# Patient Record
Sex: Female | Born: 1960 | Race: White | Hispanic: No | Marital: Married | Smoking: Never smoker
Health system: Southern US, Community
[De-identification: ages and names within clinical notes are randomized; demographics above are authoritative.]

## PROBLEM LIST (undated history)

## (undated) DIAGNOSIS — K7460 Unspecified cirrhosis of liver: Secondary | ICD-10-CM

## (undated) DIAGNOSIS — M1990 Unspecified osteoarthritis, unspecified site: Secondary | ICD-10-CM

## (undated) DIAGNOSIS — F419 Anxiety disorder, unspecified: Secondary | ICD-10-CM

## (undated) DIAGNOSIS — G893 Neoplasm related pain (acute) (chronic): Secondary | ICD-10-CM

## (undated) DIAGNOSIS — R188 Other ascites: Secondary | ICD-10-CM

## (undated) DIAGNOSIS — Z803 Family history of malignant neoplasm of breast: Secondary | ICD-10-CM

## (undated) DIAGNOSIS — D6859 Other primary thrombophilia: Secondary | ICD-10-CM

## (undated) DIAGNOSIS — I2699 Other pulmonary embolism without acute cor pulmonale: Secondary | ICD-10-CM

## (undated) DIAGNOSIS — R519 Headache, unspecified: Secondary | ICD-10-CM

## (undated) DIAGNOSIS — R112 Nausea with vomiting, unspecified: Secondary | ICD-10-CM

## (undated) DIAGNOSIS — M5030 Other cervical disc degeneration, unspecified cervical region: Secondary | ICD-10-CM

## (undated) DIAGNOSIS — I878 Other specified disorders of veins: Secondary | ICD-10-CM

## (undated) DIAGNOSIS — I10 Essential (primary) hypertension: Secondary | ICD-10-CM

## (undated) DIAGNOSIS — G4730 Sleep apnea, unspecified: Secondary | ICD-10-CM

## (undated) DIAGNOSIS — H409 Unspecified glaucoma: Secondary | ICD-10-CM

## (undated) DIAGNOSIS — F32A Depression, unspecified: Secondary | ICD-10-CM

## (undated) DIAGNOSIS — K219 Gastro-esophageal reflux disease without esophagitis: Secondary | ICD-10-CM

## (undated) DIAGNOSIS — Z9889 Other specified postprocedural states: Secondary | ICD-10-CM

## (undated) HISTORY — PX: ABDOMINAL HYSTERECTOMY: SHX81

## (undated) HISTORY — PX: PILONIDAL CYST / SINUS EXCISION: SUR543

## (undated) HISTORY — PX: COLONOSCOPY: SHX174

## (undated) HISTORY — DX: Family history of malignant neoplasm of breast: Z80.3

## (undated) HISTORY — PX: CHOLECYSTECTOMY: SHX55

## (undated) HISTORY — DX: Unspecified glaucoma: H40.9

## (undated) HISTORY — DX: Unspecified osteoarthritis, unspecified site: M19.90

## (undated) HISTORY — DX: Neoplasm related pain (acute) (chronic): G89.3

---

## 2005-02-28 ENCOUNTER — Ambulatory Visit: Payer: Self-pay | Admitting: Internal Medicine

## 2005-03-20 ENCOUNTER — Ambulatory Visit: Payer: Self-pay | Admitting: Internal Medicine

## 2006-01-29 ENCOUNTER — Emergency Department: Payer: Self-pay | Admitting: General Practice

## 2006-02-27 ENCOUNTER — Ambulatory Visit: Payer: Self-pay | Admitting: Internal Medicine

## 2006-04-10 ENCOUNTER — Ambulatory Visit: Payer: Self-pay | Admitting: Internal Medicine

## 2006-09-16 HISTORY — PX: ABDOMINAL HYSTERECTOMY: SHX81

## 2007-03-04 ENCOUNTER — Ambulatory Visit: Payer: Self-pay | Admitting: Internal Medicine

## 2007-05-06 ENCOUNTER — Ambulatory Visit: Payer: Self-pay | Admitting: Obstetrics and Gynecology

## 2007-05-11 ENCOUNTER — Inpatient Hospital Stay: Payer: Self-pay | Admitting: Obstetrics and Gynecology

## 2007-06-04 ENCOUNTER — Emergency Department: Payer: Self-pay | Admitting: Emergency Medicine

## 2008-03-07 ENCOUNTER — Ambulatory Visit: Payer: Self-pay | Admitting: Internal Medicine

## 2009-08-15 ENCOUNTER — Ambulatory Visit: Payer: Self-pay | Admitting: Internal Medicine

## 2010-05-09 ENCOUNTER — Ambulatory Visit: Payer: Self-pay | Admitting: Internal Medicine

## 2010-08-21 ENCOUNTER — Ambulatory Visit: Payer: Self-pay | Admitting: Internal Medicine

## 2011-08-29 ENCOUNTER — Ambulatory Visit: Payer: Self-pay | Admitting: Internal Medicine

## 2012-09-02 ENCOUNTER — Ambulatory Visit: Payer: Self-pay | Admitting: Internal Medicine

## 2013-04-09 ENCOUNTER — Ambulatory Visit: Payer: Self-pay | Admitting: Unknown Physician Specialty

## 2013-09-07 ENCOUNTER — Ambulatory Visit: Payer: Self-pay | Admitting: Internal Medicine

## 2014-09-13 ENCOUNTER — Ambulatory Visit: Payer: Self-pay | Admitting: Internal Medicine

## 2014-10-17 DIAGNOSIS — S8002XA Contusion of left knee, initial encounter: Secondary | ICD-10-CM | POA: Insufficient documentation

## 2014-11-02 ENCOUNTER — Ambulatory Visit: Payer: Self-pay

## 2015-03-06 ENCOUNTER — Emergency Department
Admission: EM | Admit: 2015-03-06 | Discharge: 2015-03-06 | Disposition: A | Discharge status: Home / Self Care | Payer: BC Managed Care – PPO | Attending: Emergency Medicine | Admitting: Emergency Medicine

## 2015-03-06 ENCOUNTER — Other Ambulatory Visit: Payer: Self-pay

## 2015-03-06 ENCOUNTER — Encounter: Payer: Self-pay | Admitting: Emergency Medicine

## 2015-03-06 ENCOUNTER — Emergency Department: Payer: BC Managed Care – PPO

## 2015-03-06 DIAGNOSIS — I10 Essential (primary) hypertension: Secondary | ICD-10-CM | POA: Insufficient documentation

## 2015-03-06 DIAGNOSIS — R079 Chest pain, unspecified: Secondary | ICD-10-CM | POA: Diagnosis present

## 2015-03-06 DIAGNOSIS — Z79899 Other long term (current) drug therapy: Secondary | ICD-10-CM | POA: Diagnosis not present

## 2015-03-06 DIAGNOSIS — R0602 Shortness of breath: Secondary | ICD-10-CM | POA: Diagnosis not present

## 2015-03-06 DIAGNOSIS — Z880 Allergy status to penicillin: Secondary | ICD-10-CM | POA: Diagnosis not present

## 2015-03-06 HISTORY — DX: Essential (primary) hypertension: I10

## 2015-03-06 LAB — COMPREHENSIVE METABOLIC PANEL
ALT: 29 U/L (ref 14–54)
ANION GAP: 4 — AB (ref 5–15)
AST: 27 U/L (ref 15–41)
Albumin: 3.9 g/dL (ref 3.5–5.0)
Alkaline Phosphatase: 82 U/L (ref 38–126)
BUN: 12 mg/dL (ref 6–20)
CO2: 27 mmol/L (ref 22–32)
Calcium: 9.5 mg/dL (ref 8.9–10.3)
Chloride: 106 mmol/L (ref 101–111)
Creatinine, Ser: 0.77 mg/dL (ref 0.44–1.00)
GFR calc Af Amer: >60 mL/min (ref >60)
Glucose, Bld: 107 mg/dL — ABNORMAL HIGH (ref 65–99)
Potassium: 3.3 mmol/L — ABNORMAL LOW (ref 3.5–5.1)
SODIUM: 137 mmol/L (ref 135–145)
Total Bilirubin: 0.8 mg/dL (ref 0.3–1.2)
Total Protein: 7.3 g/dL (ref 6.5–8.1)

## 2015-03-06 LAB — CBC
HCT: 38.1 % (ref 35.0–47.0)
Hemoglobin: 12.8 g/dL (ref 12.0–16.0)
MCH: 31.1 pg (ref 26.0–34.0)
MCHC: 33.4 g/dL (ref 32.0–36.0)
MCV: 93.1 fL (ref 80.0–100.0)
PLATELETS: 246 10*3/uL (ref 150–440)
RBC: 4.1 MIL/uL (ref 3.80–5.20)
RDW: 13.9 % (ref 11.5–14.5)
WBC: 7.1 10*3/uL (ref 3.6–11.0)

## 2015-03-06 LAB — TROPONIN I: Troponin I: <0.03 ng/mL (ref <0.031)

## 2015-03-06 LAB — FIBRIN DERIVATIVES D-DIMER (ARMC ONLY): Fibrin derivatives D-dimer (ARMC): 348 (ref 0–499)

## 2015-03-06 MED ORDER — NAPROXEN 500 MG PO TABS: 500.0000 mg | ORAL_TABLET | Freq: Two times a day (BID) | ORAL | Status: DC

## 2015-03-06 NOTE — Discharge Instructions (Signed)

## 2015-03-06 NOTE — ED Provider Notes (Signed)
[facility] Emergency Department Provider Note  ____________________________________________  Time seen: 9:30 AM  I have reviewed the triage vital signs and the nursing notes.   HISTORY  Chief Complaint Chest Pain and Shortness of Breath      HPI Brittany Montoya is a 54 y.o. female who was sent from urgent care for evaluation of left-sided chest discomfort that is moderate and is described as sharp in nature. Patient reports it started approximately 14 hours ago and has been relatively constant. She states it is worse with deep inspiration. Her mother recently had a heart attack. She is never had any history of coronary artery disease. No fevers chills. She complains of mild shortness of breath which he thinks this may be because it's uncomfortable to take a deep breath. She denies recent injury     Past Medical History  Diagnosis Date  . Hypertension     There are no active problems to display for this patient.   History reviewed. No pertinent past surgical history.  Current Outpatient Rx  Name  Route  Sig  Dispense  Refill  . furosemide (LASIX) 20 MG tablet   Oral   Take 1 tablet by mouth daily.         . hydrochlorothiazide (HYDRODIURIL) 12.5 MG tablet   Oral   Take 1 tablet by mouth daily.         Marland Kitchen losartan (COZAAR) 100 MG tablet   Oral   Take 1 tablet by mouth daily.         . Multiple Vitamin (MULTIVITAMIN WITH MINERALS) TABS tablet   Oral   Take 1 tablet by mouth daily.         . timolol (TIMOPTIC) 0.5 % ophthalmic solution   Both Eyes   Place 1 drop into both eyes at bedtime.            Allergies Penicillins  History reviewed. No pertinent family history.  Social History History  Substance Use Topics  . Smoking status: Never Smoker   . Smokeless tobacco: Never Used  . Alcohol Use: No    Review of Systems  Constitutional: Negative for fever. Eyes: Negative for visual changes. ENT: Negative for  sore throat Cardiovascular: Positive for chest pain Respiratory: Positive for shortness of breath Gastrointestinal: Negative for abdominal pain, vomiting and diarrhea. Genitourinary: Negative for dysuria. Musculoskeletal: Negative for back pain. Skin: Negative for rash. Neurological: Negative for headaches or focal weakness Psychiatric: No anxiety  10-point ROS otherwise negative.  ____________________________________________   PHYSICAL EXAM:  VITAL SIGNS: ED Triage Vitals  Enc Vitals Group     BP 03/06/15 0851 158/95 mmHg     Pulse Rate 03/06/15 0851 73     Resp 03/06/15 0851 20     Temp 03/06/15 0851 98.1 F (36.7 C)     Temp Source 03/06/15 0851 Oral     SpO2 03/06/15 0851 99 %     Weight 03/06/15 0851 197 lb (89.359 kg)     Height 03/06/15 0851 5\' 1"  (1.549 m)     Head Cir --      Peak Flow --      Pain Score 03/06/15 0852 8     Pain Loc --      Pain Edu? --      Excl. in [place]? --      Constitutional: Alert and oriented. Well appearing and in no distress. Eyes: Conjunctivae are normal.  ENT   Head: Normocephalic and atraumatic.  Mouth/Throat: Mucous membranes are moist. Cardiovascular: Normal rate, regular rhythm. Normal and symmetric distal pulses are present in all extremities. No murmurs, rubs, or gallops. Respiratory: Normal respiratory effort without tachypnea nor retractions. Breath sounds are clear and equal bilaterally.  Gastrointestinal: Soft and non-tender in all quadrants. No distention. There is no CVA tenderness. Genitourinary: deferred Musculoskeletal: Nontender with normal range of motion in all extremities. No lower extremity tenderness nor edema. Neurologic:  Normal speech and language. No gross focal neurologic deficits are appreciated. Skin:  Skin is warm, dry and intact. No rash noted. Psychiatric: Mood and affect are normal. Patient exhibits appropriate insight and judgment.  ____________________________________________    LABS  (pertinent positives/negatives)  Labs Reviewed  CBC  COMPREHENSIVE METABOLIC PANEL  TROPONIN I  FIBRIN DERIVATIVES D-DIMER ([facility] ONLY)    ____________________________________________   EKG  ED ECG REPORT I, Lavonia Drafts, the attending physician, personally viewed and interpreted this ECG.   Date: 03/06/2015  EKG Time: 8:54 AM  Rate: 73  Rhythm: normal sinus rhythm  Axis: Normal  Intervals:none  ST&T Change: Nonspecific   ____________________________________________    RADIOLOGY  Chest x-ray reviewed by me, no acute distress  ____________________________________________   PROCEDURES  Procedure(s) performed: none  Critical Care performed: none  ____________________________________________   INITIAL IMPRESSION / ASSESSMENT AND PLAN / ED COURSE  Pertinent labs & imaging results that were available during my care of the patient were reviewed by me and considered in my medical decision making (see chart for details).  Patient overall well-appearing. EKG reassuring. We will check cardiac enzymes and d-dimer, chest x-ray and reevaluate ----------------------------------------- 10:54 AM on 03/06/2015 -----------------------------------------  D-dimer and troponin normal. Chest x-ray unremarkable. Patient well-appearing and benign exam. Feel this is likely chest wall pain the patient will require follow-up with cardiology within 2 days. ____________________________________________   FINAL CLINICAL IMPRESSION(S) / ED DIAGNOSES  Final diagnoses:  Chest pain, unspecified chest pain type     Lavonia Drafts, MD 03/06/15 1153

## 2015-03-06 NOTE — ED Notes (Signed)
Patient to ED from [facility] clinic with reports of pain under left breast that started last night around 7pm. Patient reports pain was fairly constant through the night and is sharp in nature, accompanied by shortness of breath.

## 2015-04-06 ENCOUNTER — Ambulatory Visit: Payer: BC Managed Care – PPO | Admitting: Cardiovascular Disease

## 2015-10-02 ENCOUNTER — Other Ambulatory Visit: Payer: Self-pay | Admitting: Internal Medicine

## 2015-10-02 DIAGNOSIS — F3342 Major depressive disorder, recurrent, in full remission: Secondary | ICD-10-CM | POA: Insufficient documentation

## 2015-10-02 DIAGNOSIS — I10 Essential (primary) hypertension: Secondary | ICD-10-CM | POA: Insufficient documentation

## 2015-10-02 DIAGNOSIS — G4733 Obstructive sleep apnea (adult) (pediatric): Secondary | ICD-10-CM | POA: Insufficient documentation

## 2015-10-02 DIAGNOSIS — G8929 Other chronic pain: Secondary | ICD-10-CM

## 2015-10-02 DIAGNOSIS — M25512 Pain in left shoulder: Principal | ICD-10-CM

## 2015-10-04 ENCOUNTER — Other Ambulatory Visit: Payer: Self-pay | Admitting: Internal Medicine

## 2015-10-04 DIAGNOSIS — Z1231 Encounter for screening mammogram for malignant neoplasm of breast: Secondary | ICD-10-CM

## 2015-10-11 ENCOUNTER — Ambulatory Visit
Admission: RE | Admit: 2015-10-11 | Discharge: 2015-10-11 | Disposition: A | Discharge status: Home / Self Care | Payer: BC Managed Care – PPO | Source: Ambulatory Visit | Attending: Internal Medicine | Admitting: Internal Medicine

## 2015-10-11 DIAGNOSIS — Z1231 Encounter for screening mammogram for malignant neoplasm of breast: Secondary | ICD-10-CM | POA: Diagnosis present

## 2015-10-20 ENCOUNTER — Ambulatory Visit
Admission: RE | Admit: 2015-10-20 | Discharge: 2015-10-20 | Disposition: A | Discharge status: Home / Self Care | Payer: BC Managed Care – PPO | Source: Ambulatory Visit | Attending: Internal Medicine | Admitting: Internal Medicine

## 2015-10-20 DIAGNOSIS — G8929 Other chronic pain: Secondary | ICD-10-CM | POA: Diagnosis not present

## 2015-10-20 DIAGNOSIS — M7580 Other shoulder lesions, unspecified shoulder: Secondary | ICD-10-CM | POA: Insufficient documentation

## 2015-10-20 DIAGNOSIS — R531 Weakness: Secondary | ICD-10-CM | POA: Diagnosis not present

## 2015-10-20 DIAGNOSIS — M25512 Pain in left shoulder: Secondary | ICD-10-CM | POA: Insufficient documentation

## 2016-01-15 DIAGNOSIS — M7582 Other shoulder lesions, left shoulder: Secondary | ICD-10-CM | POA: Insufficient documentation

## 2016-01-15 DIAGNOSIS — M75112 Incomplete rotator cuff tear or rupture of left shoulder, not specified as traumatic: Secondary | ICD-10-CM | POA: Insufficient documentation

## 2016-04-01 DIAGNOSIS — M47812 Spondylosis without myelopathy or radiculopathy, cervical region: Secondary | ICD-10-CM | POA: Insufficient documentation

## 2016-11-05 ENCOUNTER — Other Ambulatory Visit: Payer: Self-pay | Admitting: Internal Medicine

## 2016-11-05 DIAGNOSIS — Z1231 Encounter for screening mammogram for malignant neoplasm of breast: Secondary | ICD-10-CM

## 2016-11-29 ENCOUNTER — Ambulatory Visit
Admission: RE | Admit: 2016-11-29 | Discharge: 2016-11-29 | Disposition: A | Discharge status: Home / Self Care | Payer: BC Managed Care – PPO | Source: Ambulatory Visit | Attending: Internal Medicine | Admitting: Internal Medicine

## 2016-11-29 DIAGNOSIS — Z1231 Encounter for screening mammogram for malignant neoplasm of breast: Secondary | ICD-10-CM | POA: Insufficient documentation

## 2018-03-10 ENCOUNTER — Other Ambulatory Visit: Payer: Self-pay | Admitting: Internal Medicine

## 2018-03-10 DIAGNOSIS — Z1231 Encounter for screening mammogram for malignant neoplasm of breast: Secondary | ICD-10-CM

## 2018-03-11 ENCOUNTER — Encounter (INDEPENDENT_AMBULATORY_CARE_PROVIDER_SITE_OTHER): Payer: Self-pay

## 2018-03-11 ENCOUNTER — Ambulatory Visit
Admission: RE | Admit: 2018-03-11 | Discharge: 2018-03-11 | Disposition: A | Discharge status: Home / Self Care | Payer: BC Managed Care – PPO | Source: Ambulatory Visit | Attending: Internal Medicine | Admitting: Internal Medicine

## 2018-03-11 DIAGNOSIS — Z1231 Encounter for screening mammogram for malignant neoplasm of breast: Secondary | ICD-10-CM | POA: Insufficient documentation

## 2019-01-21 DIAGNOSIS — E7849 Other hyperlipidemia: Secondary | ICD-10-CM | POA: Insufficient documentation

## 2019-02-04 ENCOUNTER — Other Ambulatory Visit: Payer: Self-pay | Admitting: Internal Medicine

## 2019-02-04 DIAGNOSIS — Z1231 Encounter for screening mammogram for malignant neoplasm of breast: Secondary | ICD-10-CM

## 2019-03-02 DIAGNOSIS — H409 Unspecified glaucoma: Secondary | ICD-10-CM | POA: Insufficient documentation

## 2019-03-02 DIAGNOSIS — I878 Other specified disorders of veins: Secondary | ICD-10-CM | POA: Insufficient documentation

## 2019-03-02 DIAGNOSIS — M5030 Other cervical disc degeneration, unspecified cervical region: Secondary | ICD-10-CM | POA: Insufficient documentation

## 2019-03-03 ENCOUNTER — Encounter: Payer: Self-pay | Admitting: Podiatry

## 2019-03-03 ENCOUNTER — Ambulatory Visit: Payer: BC Managed Care – PPO

## 2019-03-03 ENCOUNTER — Ambulatory Visit: Payer: BC Managed Care – PPO | Admitting: Podiatry

## 2019-03-03 ENCOUNTER — Other Ambulatory Visit: Payer: Self-pay

## 2019-03-03 VITALS — Temp 97.7°F

## 2019-03-03 DIAGNOSIS — M7751 Other enthesopathy of right foot: Secondary | ICD-10-CM

## 2019-03-03 DIAGNOSIS — M779 Enthesopathy, unspecified: Secondary | ICD-10-CM | POA: Diagnosis not present

## 2019-03-03 DIAGNOSIS — Q828 Other specified congenital malformations of skin: Secondary | ICD-10-CM | POA: Diagnosis not present

## 2019-03-03 DIAGNOSIS — M7752 Other enthesopathy of left foot: Secondary | ICD-10-CM | POA: Diagnosis not present

## 2019-03-03 DIAGNOSIS — M778 Other enthesopathies, not elsewhere classified: Secondary | ICD-10-CM

## 2019-03-03 NOTE — Progress Notes (Signed)
  Subjective:  Patient ID: Brittany Montoya, female    DOB: Dec 14, 1960,  MRN: [id number] HPI Chief Complaint  Patient presents with  . Callouses    Patient presents today for painful callouses bilat 5th mets x months.  She states "sometimes it feels like something poking in my foot and my left is much worse than my right foot."  She says she has used the corn pads with no relief     59 y.o. female presents with the above complaint.   ROS: Denies fever chills nausea vomiting muscle aches pains calf pain back pain chest pain shortness of breath.  Past Medical History:  Diagnosis Date  . Hypertension    Past Surgical History:  Procedure Laterality Date  . ABDOMINAL HYSTERECTOMY      Current Outpatient Medications:  .  DULoxetine (CYMBALTA) 30 MG capsule, Take by mouth., Disp: , Rfl:  .  acetaminophen (TYLENOL) 500 MG tablet, Take by mouth., Disp: , Rfl:  .  hydrochlorothiazide (HYDRODIURIL) 12.5 MG tablet, Take 1 tablet by mouth daily., Disp: , Rfl:  .  losartan (COZAAR) 100 MG tablet, Take 1 tablet by mouth daily., Disp: , Rfl:  .  Multiple Vitamin (MULTIVITAMIN WITH MINERALS) TABS tablet, Take 1 tablet by mouth daily., Disp: , Rfl:  .  naproxen (NAPROSYN) 500 MG tablet, Take 1 tablet (500 mg total) by mouth 2 (two) times daily with a meal., Disp: 20 tablet, Rfl: 2 .  timolol (TIMOPTIC) 0.5 % ophthalmic solution, Place 1 drop into both eyes at bedtime. , Disp: , Rfl:   Allergies  Allergen Reactions  . Penicillins   . Amlodipine Other (See Comments) and Rash    Not effective Not effective    Review of Systems Objective:   Vitals:   03/03/19 1430  Temp: 97.7 F (36.5 C)    General: Well developed, nourished, in no acute distress, alert and oriented x3   Dermatological: Skin is warm, dry and supple bilateral. Nails x 10 are well maintained; remaining integument appears unremarkable at this time. There are no open sores, no preulcerative lesions, no rash or signs of  infection present.  Porokeratosis lateral aspect of the fifth metatarsal heads bilateral.  Vascular: Dorsalis Pedis artery and Posterior Tibial artery pedal pulses are 2/4 bilateral with immedate capillary fill time. Pedal hair growth present. No varicosities and no lower extremity edema present bilateral.   Neruologic: Grossly intact via light touch bilateral. Vibratory intact via tuning fork bilateral. Protective threshold with Semmes Wienstein monofilament intact to all pedal sites bilateral. Patellar and Achilles deep tendon reflexes 2+ bilateral. No Babinski or clonus noted bilateral.   Musculoskeletal: No gross boney pedal deformities bilateral. No pain, crepitus, or limitation noted with foot and ankle range of motion bilateral. Muscular strength 5/5 in all groups tested bilateral.  Palpable bursitis beneath the fifth metatarsal heads and lateral to the fifth metatarsal heads bilaterally.  Gait: Unassisted, Nonantalgic.    Radiographs:  None taken  Assessment & Plan:   Assessment: Bursitis fifth metatarsal phalangeal joints plantar lateral aspect bilaterally porokeratotic lesions lateral aspect of the fifth metatarsal heads bilaterally  Plan: Discussed etiology pathology conservative surgical therapy at this point in time injected 2 mg of dexamethasone and local anesthetic beneath the lesions at each fifth metatarsal head.  This was performed after Betadine was applied.  I also debrided all reactive hyperkeratotic tissue for her.     Max T. [place], [place]

## 2019-03-15 ENCOUNTER — Other Ambulatory Visit: Payer: Self-pay

## 2019-03-15 ENCOUNTER — Ambulatory Visit
Admission: RE | Admit: 2019-03-15 | Discharge: 2019-03-15 | Disposition: A | Discharge status: Home / Self Care | Payer: BC Managed Care – PPO | Source: Ambulatory Visit | Attending: Internal Medicine | Admitting: Internal Medicine

## 2019-03-15 DIAGNOSIS — Z1231 Encounter for screening mammogram for malignant neoplasm of breast: Secondary | ICD-10-CM | POA: Insufficient documentation

## 2019-11-23 ENCOUNTER — Ambulatory Visit: Payer: BC Managed Care – PPO | Admitting: Podiatry

## 2019-11-23 ENCOUNTER — Other Ambulatory Visit: Payer: Self-pay | Admitting: Podiatry

## 2019-11-23 ENCOUNTER — Ambulatory Visit (INDEPENDENT_AMBULATORY_CARE_PROVIDER_SITE_OTHER): Payer: BC Managed Care – PPO

## 2019-11-23 ENCOUNTER — Other Ambulatory Visit: Payer: Self-pay

## 2019-11-23 DIAGNOSIS — M79671 Pain in right foot: Secondary | ICD-10-CM | POA: Diagnosis not present

## 2019-11-23 DIAGNOSIS — M722 Plantar fascial fibromatosis: Secondary | ICD-10-CM | POA: Diagnosis not present

## 2019-11-23 MED ORDER — MELOXICAM 15 MG PO TABS: 15.0000 mg | ORAL_TABLET | Freq: Every day | ORAL | 1 refills | Status: DC

## 2019-11-26 NOTE — Progress Notes (Signed)
   Subjective: 59 y.o. female presenting today with a chief complaint of constant stabbing pain to the right heel that began 3-4 months ago. She states the pain radiates to the midfoot. Walking increases the pain. She has been using a heel brace for treatment. Patient is here for further evaluation and treatment.   Past Medical History:  Diagnosis Date  . Hypertension      Objective: Physical Exam General: The patient is alert and oriented x3 in no acute distress.  Dermatology: Skin is warm, dry and supple bilateral lower extremities. Negative for open lesions or macerations bilateral.   Vascular: Dorsalis Pedis and Posterior Tibial pulses palpable bilateral.  Capillary fill time is immediate to all digits.  Neurological: Epicritic and protective threshold intact bilateral.   Musculoskeletal: Tenderness to palpation to the plantar aspect of the right heel along the plantar fascia. All other joints range of motion within normal limits bilateral. Strength 5/5 in all groups bilateral.   Radiographic exam: Normal osseous mineralization. Joint spaces preserved. No fracture/dislocation/boney destruction. No other soft tissue abnormalities or radiopaque foreign bodies.   Assessment: 1. Plantar fasciitis right  Plan of Care:  1. Patient evaluated. Xrays reviewed.   2. Injection of 0.5cc Celestone soluspan injected into the right plantar fascia  3. Prescription for Meloxicam provided to patient. 4. Plantar fascial band(s) dispensed 5. Instructed patient regarding therapies and modalities at home to alleviate symptoms.  6. Return to clinic in 4 weeks.     Edrick Kins, DPM [facility]  Dr. Edrick Kins, DPM    [address], [place] [address]                Office [phone]  Fax [phone]

## 2019-12-24 ENCOUNTER — Ambulatory Visit (INDEPENDENT_AMBULATORY_CARE_PROVIDER_SITE_OTHER): Payer: BC Managed Care – PPO | Admitting: Podiatry

## 2019-12-24 ENCOUNTER — Other Ambulatory Visit: Payer: Self-pay

## 2019-12-24 DIAGNOSIS — M722 Plantar fascial fibromatosis: Secondary | ICD-10-CM | POA: Diagnosis not present

## 2019-12-24 DIAGNOSIS — M79671 Pain in right foot: Secondary | ICD-10-CM

## 2019-12-27 NOTE — Progress Notes (Signed)
   Subjective: 59 y.o. female presenting today for follow up evaluation of plantar fasciitis of the right foot. She reports improvement but states the pain flared back up a few days ago. She has been taking Meloxicam and using the plantar fascial brace as directed. She has been wearing new Brooks shoes. Being on the foot excessively increases the pain. Patient is here for further evaluation and treatment.   Past Medical History:  Diagnosis Date  . Hypertension      Objective: Physical Exam General: The patient is alert and oriented x3 in no acute distress.  Dermatology: Skin is warm, dry and supple bilateral lower extremities. Negative for open lesions or macerations bilateral.   Vascular: Dorsalis Pedis and Posterior Tibial pulses palpable bilateral.  Capillary fill time is immediate to all digits.  Neurological: Epicritic and protective threshold intact bilateral.   Musculoskeletal: Tenderness to palpation to the plantar aspect of the right heel along the plantar fascia. All other joints range of motion within normal limits bilateral. Strength 5/5 in all groups bilateral.   Assessment: 1. Plantar fasciitis right  Plan of Care:  1. Patient evaluated. 2. Injection of 0.5cc Celestone soluspan injected into the right plantar fascia  3. Continue taking Meloxicam.  4. Continue using plantar fascial brace.  5. Continue wearing Brooks running shoes.  6. Return to clinic as needed.    Edrick Kins, DPM [facility]  Dr. Edrick Kins, DPM    [address], [place] [address]                Office [phone]  Fax [phone]

## 2020-03-06 ENCOUNTER — Other Ambulatory Visit: Payer: Self-pay | Admitting: Internal Medicine

## 2020-03-06 DIAGNOSIS — Z1231 Encounter for screening mammogram for malignant neoplasm of breast: Secondary | ICD-10-CM

## 2020-03-23 ENCOUNTER — Ambulatory Visit: Payer: BC Managed Care – PPO

## 2020-03-29 ENCOUNTER — Ambulatory Visit: Payer: BC Managed Care – PPO

## 2020-05-08 ENCOUNTER — Ambulatory Visit: Payer: BC Managed Care – PPO

## 2020-05-08 ENCOUNTER — Encounter (INDEPENDENT_AMBULATORY_CARE_PROVIDER_SITE_OTHER): Payer: Self-pay

## 2020-05-08 ENCOUNTER — Other Ambulatory Visit: Payer: Self-pay

## 2020-05-08 ENCOUNTER — Ambulatory Visit
Admission: RE | Admit: 2020-05-08 | Discharge: 2020-05-08 | Disposition: A | Discharge status: Home / Self Care | Payer: BC Managed Care – PPO | Source: Ambulatory Visit | Attending: Internal Medicine | Admitting: Internal Medicine

## 2020-05-08 DIAGNOSIS — Z1231 Encounter for screening mammogram for malignant neoplasm of breast: Secondary | ICD-10-CM

## 2020-06-10 ENCOUNTER — Ambulatory Visit (INDEPENDENT_AMBULATORY_CARE_PROVIDER_SITE_OTHER): Payer: BC Managed Care – PPO

## 2020-06-10 ENCOUNTER — Other Ambulatory Visit: Payer: Self-pay

## 2020-06-10 ENCOUNTER — Ambulatory Visit
Admission: EM | Admit: 2020-06-10 | Discharge: 2020-06-10 | Disposition: A | Discharge status: Home / Self Care | Payer: BC Managed Care – PPO | Attending: Emergency Medicine | Admitting: Emergency Medicine

## 2020-06-10 DIAGNOSIS — M79671 Pain in right foot: Secondary | ICD-10-CM

## 2020-06-10 DIAGNOSIS — S9031XA Contusion of right foot, initial encounter: Secondary | ICD-10-CM

## 2020-06-10 NOTE — ED Triage Notes (Signed)
Patient states that she has been having right foot pain that occurred around 1.5 hours ago. States that she stepped on the vacuum hose and her foot turned over. States that pain has been constant since.

## 2020-06-10 NOTE — Discharge Instructions (Signed)
Ice foot for 15 minues 3-5 times a day for 48h.

## 2020-06-13 ENCOUNTER — Ambulatory Visit: Payer: BC Managed Care – PPO | Admitting: Podiatry

## 2020-06-13 ENCOUNTER — Encounter: Payer: Self-pay | Admitting: *Deleted

## 2020-06-13 ENCOUNTER — Other Ambulatory Visit: Payer: Self-pay

## 2020-06-13 DIAGNOSIS — S9031XA Contusion of right foot, initial encounter: Secondary | ICD-10-CM | POA: Diagnosis not present

## 2020-06-13 MED ORDER — MELOXICAM 15 MG PO TABS: 15.0000 mg | ORAL_TABLET | Freq: Every day | ORAL | 1 refills | Status: DC

## 2020-06-13 MED ORDER — TRAMADOL HCL 50 MG PO TABS
50.0000 mg | ORAL_TABLET | ORAL | 0 refills | Status: AC | PRN
Start: 2020-06-13 — End: 2020-06-18

## 2020-06-13 NOTE — Progress Notes (Signed)
   HPI: 59 y.o. female presenting today with a new complaint regarding an injury that occurred approximately 4 days ago.  DOI: 06/10/2020.  Patient states that she was vacuuming at home when she stepped down hard on the vacuum hose.  She noticed severe pain and tenderness to the right foot immediately.  She went to the doctor/urgent care where x-rays were taken and were negative for fracture.  She has been icing her foot and wearing a postsurgical shoe.  She presents today for follow-up treatment and evaluation  Past Medical History:  Diagnosis Date  . Hypertension      Physical Exam: General: The patient is alert and oriented x3 in no acute distress.  Dermatology: Skin is warm, dry and supple bilateral lower extremities. Negative for open lesions or macerations.  Vascular: Palpable pedal pulses bilaterally. Capillary refill within normal limits.  Ecchymosis with mild edema noted along the plantar arch of the right foot.  According to the patient this is where she stepped down hard on the vacuum hose  Neurological: Epicritic and protective threshold grossly intact bilaterally.   Musculoskeletal Exam: Range of motion within normal limits to all pedal and ankle joints bilateral. Muscle strength 5/5 in all groups bilateral.  Pain and tenderness throughout the arch of the right foot at the site of injury   Assessment: 1.  Right foot contusion 2.  Diffuse pain right foot   Plan of Care:  1. Patient evaluated.  2.  Discontinue postsurgical shoe.  Cam boot dispensed.  Weightbearing as tolerated x2 weeks 3.  Prescription for meloxicam 15 mg daily 4.  Prescription for tramadol 50 mg every 4 hours as needed pain 5.  Ace wrap applied.  Wear daily 6.  Note for work was provided today.  No work x3 weeks. 7.  Return to clinic in 3 weeks  *Works at [employer]?      Edrick Kins, DPM [facility]  Dr. Edrick Kins, DPM    [address], [place] [address]                Office [phone]  Fax [phone]

## 2020-07-04 ENCOUNTER — Encounter: Payer: Self-pay | Admitting: *Deleted

## 2020-07-04 ENCOUNTER — Ambulatory Visit: Payer: BC Managed Care – PPO | Admitting: Podiatry

## 2020-07-04 ENCOUNTER — Other Ambulatory Visit: Payer: Self-pay

## 2020-07-04 DIAGNOSIS — M778 Other enthesopathies, not elsewhere classified: Secondary | ICD-10-CM | POA: Diagnosis not present

## 2020-07-04 DIAGNOSIS — S93601D Unspecified sprain of right foot, subsequent encounter: Secondary | ICD-10-CM

## 2020-07-04 NOTE — Progress Notes (Signed)
   HPI: 59 y.o. female presenting today for follow-up evaluation regarding a foot sprain that occurred to the patient's right foot.  DOI: 06/10/2020.  Patient states that there has been improvement.  She wore the cam boot as instructed for approximately 2 weeks.  She is also been taking the meloxicam and tramadol as needed for the pain.  For the past week she has transitioned out of the cam boot into good supportive sneakers.  She presents for follow-up treatment evaluation  Past Medical History:  Diagnosis Date  . Hypertension      Physical Exam: General: The patient is alert and oriented x3 in no acute distress.  Dermatology: Skin is warm, dry and supple bilateral lower extremities. Negative for open lesions or macerations.  Vascular: Palpable pedal pulses bilaterally. Capillary refill within normal limits.  Ecchymosis with mild edema noted along the plantar arch of the right foot.  According to the patient this is where she stepped down hard on the vacuum hose  Neurological: Epicritic and protective threshold grossly intact bilaterally.   Musculoskeletal Exam: Range of motion within normal limits to all pedal and ankle joints bilateral. Muscle strength 5/5 in all groups bilateral.  There continues to be some pain and tenderness throughout the arch of the right foot at the site of injury as well as the dorsum of the right foot midtarsal joint   Assessment: 1.  Right foot contusion 2.  Diffuse pain right foot   Plan of Care:  1. Patient evaluated.  2.  Continue wearing good supportive sneakers 3.  Compression ankle sleeve dispensed 4.  Injection of 0.5 cc Celestone Soluspan injected into the midtarsal joint right foot at the area of most pain 5.  Continue meloxicam 15 mg daily and tramadol 50 mg as needed pain 6.  Patient may return to work 07/10/2020 full activity no restrictions 7.  Return to clinic as needed  *Works at [employer]      Edrick Kins, DPM [facility]  Dr. Edrick Kins, DPM    [address], [place] [address]                Office [phone]  Fax [phone]

## 2020-07-11 NOTE — ED Provider Notes (Signed)
[facility]    CSN: [id number] Arrival date & time: 06/10/20  1516      History   Chief Complaint Chief Complaint  Patient presents with  . Foot Pain    right    HPI Brittany Montoya is a 59 y.o. female who presents with R foot pain x  1 1/2 hours ago. She stepped over a vacuum hose and her foot twisted inwards. The pain has been constant. Denies hitting her head and denies pain anywhere else.     Past Medical History:  Diagnosis Date  . Hypertension     Patient Active Problem List   Diagnosis Date Noted  . DDD (degenerative disc disease), cervical 03/02/2019  . Glaucoma (increased eye pressure) 03/02/2019  . Venous stasis 03/02/2019  . Facet arthritis of cervical region 04/01/2016  . Incomplete tear of left rotator cuff 01/15/2016  . Rotator cuff tendinitis, left 01/15/2016  . Essential hypertension 10/02/2015  . Obstructive sleep apnea syndrome 10/02/2015  . Recurrent major depressive disorder, in full remission ([place]) 10/02/2015  . Contusion of left knee 10/17/2014    Past Surgical History:  Procedure Laterality Date  . ABDOMINAL HYSTERECTOMY      OB History   No obstetric history on file.      Home Medications    Prior to Admission medications   Medication Sig Start Date End Date Taking? Authorizing Provider  acetaminophen (TYLENOL) 500 MG tablet Take by mouth.   Yes [provider]  DULoxetine (CYMBALTA) 30 MG capsule Take by mouth. 04/20/14  Yes [provider]  hydrochlorothiazide (HYDRODIURIL) 12.5 MG tablet Take 1 tablet by mouth daily. 12/03/14  Yes [provider]  losartan (COZAAR) 100 MG tablet Take 1 tablet by mouth daily. 02/04/15  Yes [provider]  naproxen (NAPROSYN) 500 MG tablet Take 1 tablet (500 mg total) by mouth 2 (two) times daily with a meal. 03/06/15  Yes Lavonia Drafts, MD  timolol (TIMOPTIC) 0.5 % ophthalmic solution Place 1 drop into both eyes at bedtime.  01/20/15  Yes [provider]  meloxicam (MOBIC) 15 MG tablet Take 1 tablet (15 mg total) by mouth daily. 06/13/20   Edrick Kins, DPM  Multiple Vitamin (MULTIVITAMIN WITH MINERALS) TABS tablet Take 1 tablet by mouth daily.    [provider]    Family History Family History  Problem Relation Age of Onset  . Breast cancer Mother 80    Social History Social History   Tobacco Use  . Smoking status: Never Smoker  . Smokeless tobacco: Never Used  Vaping Use  . Vaping Use: Never used  Substance Use Topics  . Alcohol use: No  . Drug use: No     Allergies   Penicillins and Amlodipine   Review of Systems Review of Systems  Constitutional: Positive for activity change.  Musculoskeletal: Positive for arthralgias and joint swelling.  Skin: Negative for color change, pallor, rash and wound.  Neurological: Negative for numbness.     Physical Exam Triage Vital Signs ED Triage Vitals  Enc Vitals Group     BP 06/10/20 1534 (!) 146/100     Pulse Rate 06/10/20 1534 (!) 120     Resp 06/10/20 1534 18     Temp 06/10/20 1534 98.6 F (37 C)     Temp Source 06/10/20 1534 Oral     SpO2 06/10/20 1534 97 %     Weight 06/10/20 1532 200 lb (90.7 kg)     Height 06/10/20  1532 5\' 1"  (1.549 m)     Head Circumference --      Peak Flow --      Pain Score 06/10/20 1532 6     Pain Loc --      Pain Edu? --      Excl. in [place]? --    No data found.  Updated Vital Signs BP (!) 146/100 (BP Location: Right Arm)   Pulse (!) 120   Temp 98.6 F (37 C) (Oral)   Resp 18   Ht 5\' 1"  (1.549 m)   Wt 200 lb (90.7 kg)   SpO2 97%   BMI 37.79 kg/m   Visual Acuity Right Eye Distance:   Left Eye Distance:   Bilateral Distance:    Right Eye Near:   Left Eye Near:    Bilateral Near:     Physical Exam Vitals and nursing note reviewed.  Constitutional:      Appearance: She is obese.  HENT:     Right Ear: External ear normal.     Left Ear: External ear normal.  Eyes:     General: No scleral  icterus.    Conjunctiva/sclera: Conjunctivae normal.  Pulmonary:     Effort: Pulmonary effort is normal.  Musculoskeletal:        General: Swelling and tenderness present. No deformity.     Cervical back: Neck supple.     Comments: R FOOT- with moderate swelling and has point tenderness over mid metatarsals more than lateral. ROM of toes are normal, but provoked pain.  R ANKLE- normal  Skin:    General: Skin is warm and dry.     Findings: No rash.  Neurological:     Mental Status: She is alert and oriented to person, place, and time.     Gait: Gait abnormal.  Psychiatric:        Mood and Affect: Mood normal.        Behavior: Behavior normal.        Thought Content: Thought content normal.        Judgment: Judgment normal.    UC Treatments / Results  Labs (all labs ordered are listed, but only abnormal results are displayed) Labs Reviewed - No data to display  EKG   Radiology No results found.  Procedures Procedures (including critical care time)  Medications Ordered in UC Medications - No data to display  Initial Impression / Assessment and Plan / UC Course  I have reviewed the triage vital signs and the nursing notes. Pertinent  imaging results that were available during my care of the patient were reviewed by me and considered in my medical decision making (see chart for details). Was placed on post op shoe and advised to FU with PCP. See instructions.  Clinical Course as of Jul 12 1211  Tue Jul 11, 2020  1210 DG Foot Complete Right [SR]    Clinical Course User Index [SR] [place], Sunday Spillers, [place]     Final Clinical Impressions(s) / UC Diagnoses   Final diagnoses:  Contusion of right foot, initial encounter     Discharge Instructions     Ice foot for 15 minues 3-5 times a day for 48h.     ED Prescriptions    None     PDMP not reviewed this encounter.   Shelby Mattocks, PA-C 07/11/20 1227

## 2021-03-22 ENCOUNTER — Other Ambulatory Visit: Payer: Self-pay | Admitting: *Deleted

## 2021-03-22 ENCOUNTER — Other Ambulatory Visit: Payer: Self-pay | Admitting: Internal Medicine

## 2021-03-22 DIAGNOSIS — R1084 Generalized abdominal pain: Secondary | ICD-10-CM

## 2021-03-23 ENCOUNTER — Ambulatory Visit (HOSPITAL_COMMUNITY)
Admission: RE | Admit: 2021-03-23 | Discharge: 2021-03-23 | Disposition: A | Discharge status: Home / Self Care | Payer: BC Managed Care – PPO | Source: Ambulatory Visit | Attending: Internal Medicine | Admitting: Internal Medicine

## 2021-03-23 ENCOUNTER — Other Ambulatory Visit: Payer: Self-pay

## 2021-03-23 DIAGNOSIS — R1084 Generalized abdominal pain: Secondary | ICD-10-CM | POA: Diagnosis present

## 2021-03-23 MED ORDER — SODIUM CHLORIDE (PF) 0.9 % IJ SOLN
INTRAMUSCULAR | Status: AC
  Filled 2021-03-23: qty 50

## 2021-03-23 MED ORDER — IOHEXOL 300 MG/ML  SOLN
100.0000 mL | Freq: Once | INTRAMUSCULAR | Status: AC | PRN
  Administered 2021-03-23: 100 mL via INTRAVENOUS

## 2021-03-26 DIAGNOSIS — N838 Other noninflammatory disorders of ovary, fallopian tube and broad ligament: Secondary | ICD-10-CM | POA: Insufficient documentation

## 2021-03-27 ENCOUNTER — Ambulatory Visit: Payer: BC Managed Care – PPO

## 2021-04-05 ENCOUNTER — Other Ambulatory Visit: Payer: Self-pay

## 2021-04-05 ENCOUNTER — Telehealth: Payer: Self-pay

## 2021-04-05 DIAGNOSIS — R180 Malignant ascites: Secondary | ICD-10-CM

## 2021-04-05 DIAGNOSIS — N83291 Other ovarian cyst, right side: Secondary | ICD-10-CM

## 2021-04-05 NOTE — Telephone Encounter (Signed)
Received referral from Dr. Ouida Sills for gyn oncology . CT noted below. CA125 70.9, HE4 409.0, drawn 04-03-2021. Will need to establish with medical oncology as well. Appointments arranged and contacted Ms. Sharlett Iles with details. We will also attempt to get paracentesis performed before these appointments for her bloating and cytology. Will call her once paracentesis is arranged.    IMPRESSION: 1. 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 Moderate associated ascites.

## 2021-04-06 ENCOUNTER — Telehealth: Payer: Self-pay

## 2021-04-06 NOTE — Telephone Encounter (Signed)
Called and went over date/time/instructions for paracentesis, scheduled 04/09/21 at 1430, arrival 1400 [facility] the [facility] entrance. Went over what to expect during procedure. All questions answered.

## 2021-04-09 ENCOUNTER — Other Ambulatory Visit: Payer: Self-pay

## 2021-04-09 ENCOUNTER — Ambulatory Visit
Admission: RE | Admit: 2021-04-09 | Discharge: 2021-04-09 | Disposition: A | Discharge status: Home / Self Care | Payer: BC Managed Care – PPO | Source: Ambulatory Visit | Attending: Obstetrics and Gynecology | Admitting: Obstetrics and Gynecology

## 2021-04-09 DIAGNOSIS — R180 Malignant ascites: Secondary | ICD-10-CM | POA: Diagnosis present

## 2021-04-09 DIAGNOSIS — N83291 Other ovarian cyst, right side: Secondary | ICD-10-CM | POA: Insufficient documentation

## 2021-04-09 NOTE — Procedures (Signed)
PROCEDURE SUMMARY:  Successful image-guided paracentesis from the right lower abdomen.  Yielded 6.3 liters of hazy yellow fluid.  No immediate complications.  EBL < 1 mL Patient tolerated well.   Specimen was sent for labs.  Please see imaging section of Epic for full dictation.  Joaquim Nam PA-C 04/09/2021 3:52 PM

## 2021-04-11 ENCOUNTER — Inpatient Hospital Stay (HOSPITAL_BASED_OUTPATIENT_CLINIC_OR_DEPARTMENT_OTHER): Payer: BC Managed Care – PPO | Admitting: Obstetrics and Gynecology

## 2021-04-11 ENCOUNTER — Telehealth: Payer: Self-pay

## 2021-04-11 ENCOUNTER — Encounter: Payer: Self-pay | Admitting: Oncology

## 2021-04-11 ENCOUNTER — Encounter: Payer: Self-pay | Admitting: Obstetrics and Gynecology

## 2021-04-11 ENCOUNTER — Other Ambulatory Visit: Payer: Self-pay

## 2021-04-11 ENCOUNTER — Inpatient Hospital Stay: Payer: BC Managed Care – PPO

## 2021-04-11 ENCOUNTER — Inpatient Hospital Stay: Payer: BC Managed Care – PPO | Attending: Oncology | Admitting: Oncology

## 2021-04-11 ENCOUNTER — Ambulatory Visit
Admission: RE | Admit: 2021-04-11 | Discharge: 2021-04-11 | Disposition: A | Discharge status: Home / Self Care | Payer: BC Managed Care – PPO | Source: Ambulatory Visit | Attending: Obstetrics and Gynecology | Admitting: Obstetrics and Gynecology

## 2021-04-11 VITALS — BP 124/82 | HR 98 | Temp 96.2°F | Resp 20 | Wt 196.5 lb

## 2021-04-11 VITALS — BP 124/82 | HR 98 | Temp 96.2°F | Resp 18 | Wt 198.0 lb

## 2021-04-11 DIAGNOSIS — I10 Essential (primary) hypertension: Secondary | ICD-10-CM | POA: Diagnosis not present

## 2021-04-11 DIAGNOSIS — R0602 Shortness of breath: Secondary | ICD-10-CM

## 2021-04-11 DIAGNOSIS — K7469 Other cirrhosis of liver: Secondary | ICD-10-CM

## 2021-04-11 DIAGNOSIS — R180 Malignant ascites: Secondary | ICD-10-CM | POA: Insufficient documentation

## 2021-04-11 DIAGNOSIS — R971 Elevated cancer antigen 125 [CA 125]: Secondary | ICD-10-CM | POA: Insufficient documentation

## 2021-04-11 DIAGNOSIS — R5383 Other fatigue: Secondary | ICD-10-CM | POA: Diagnosis not present

## 2021-04-11 DIAGNOSIS — N83291 Other ovarian cyst, right side: Secondary | ICD-10-CM

## 2021-04-11 DIAGNOSIS — R600 Localized edema: Secondary | ICD-10-CM | POA: Diagnosis not present

## 2021-04-11 DIAGNOSIS — C569 Malignant neoplasm of unspecified ovary: Secondary | ICD-10-CM

## 2021-04-11 DIAGNOSIS — Z7189 Other specified counseling: Secondary | ICD-10-CM

## 2021-04-11 DIAGNOSIS — Z791 Long term (current) use of non-steroidal anti-inflammatories (NSAID): Secondary | ICD-10-CM | POA: Diagnosis not present

## 2021-04-11 DIAGNOSIS — E876 Hypokalemia: Secondary | ICD-10-CM | POA: Diagnosis not present

## 2021-04-11 DIAGNOSIS — N838 Other noninflammatory disorders of ovary, fallopian tube and broad ligament: Secondary | ICD-10-CM | POA: Diagnosis not present

## 2021-04-11 DIAGNOSIS — N83201 Unspecified ovarian cyst, right side: Secondary | ICD-10-CM | POA: Diagnosis not present

## 2021-04-11 DIAGNOSIS — R140 Abdominal distension (gaseous): Secondary | ICD-10-CM | POA: Insufficient documentation

## 2021-04-11 DIAGNOSIS — M7989 Other specified soft tissue disorders: Secondary | ICD-10-CM

## 2021-04-11 DIAGNOSIS — Z79899 Other long term (current) drug therapy: Secondary | ICD-10-CM | POA: Insufficient documentation

## 2021-04-11 DIAGNOSIS — R110 Nausea: Secondary | ICD-10-CM | POA: Diagnosis not present

## 2021-04-11 DIAGNOSIS — K5900 Constipation, unspecified: Secondary | ICD-10-CM | POA: Diagnosis not present

## 2021-04-11 DIAGNOSIS — R0609 Other forms of dyspnea: Secondary | ICD-10-CM

## 2021-04-11 DIAGNOSIS — R0600 Dyspnea, unspecified: Secondary | ICD-10-CM

## 2021-04-11 LAB — CBC WITH DIFFERENTIAL/PLATELET
Abs Immature Granulocytes: 0.04 10*3/uL (ref 0.00–0.07)
Basophils Absolute: 0 10*3/uL (ref 0.0–0.1)
Basophils Relative: 0 %
Eosinophils Absolute: 0.1 10*3/uL (ref 0.0–0.5)
Eosinophils Relative: 1 %
HCT: 37.9 % (ref 36.0–46.0)
Hemoglobin: 12.6 g/dL (ref 12.0–15.0)
Immature Granulocytes: 0 %
Lymphocytes Relative: 7 %
Lymphs Abs: 0.7 10*3/uL (ref 0.7–4.0)
MCH: 29.8 pg (ref 26.0–34.0)
MCHC: 33.2 g/dL (ref 30.0–36.0)
MCV: 89.6 fL (ref 80.0–100.0)
Monocytes Absolute: 0.6 10*3/uL (ref 0.1–1.0)
Monocytes Relative: 6 %
Neutro Abs: 8.2 10*3/uL — ABNORMAL HIGH (ref 1.7–7.7)
Neutrophils Relative %: 86 %
Platelets: 337 10*3/uL (ref 150–400)
RBC: 4.23 MIL/uL (ref 3.87–5.11)
RDW: 13.7 % (ref 11.5–15.5)
WBC: 9.6 10*3/uL (ref 4.0–10.5)
nRBC: 0 % (ref 0.0–0.2)

## 2021-04-11 LAB — COMPREHENSIVE METABOLIC PANEL
ALT: 12 U/L (ref 0–44)
AST: 26 U/L (ref 15–41)
Albumin: 2.7 g/dL — ABNORMAL LOW (ref 3.5–5.0)
Alkaline Phosphatase: 73 U/L (ref 38–126)
Anion gap: 7 (ref 5–15)
BUN: 7 mg/dL (ref 6–20)
CO2: 30 mmol/L (ref 22–32)
Calcium: 9.1 mg/dL (ref 8.9–10.3)
Chloride: 98 mmol/L (ref 98–111)
Creatinine, Ser: 0.92 mg/dL (ref 0.44–1.00)
GFR, Estimated: >60 mL/min (ref >60)
Glucose, Bld: 113 mg/dL — ABNORMAL HIGH (ref 70–99)
Potassium: 2.8 mmol/L — ABNORMAL LOW (ref 3.5–5.1)
Sodium: 135 mmol/L (ref 135–145)
Total Bilirubin: 0.4 mg/dL (ref 0.3–1.2)
Total Protein: 6.2 g/dL — ABNORMAL LOW (ref 6.5–8.1)

## 2021-04-11 LAB — HEPATITIS PANEL, ACUTE
HCV Ab: NONREACTIVE
Hep A IgM: NONREACTIVE
Hep B C IgM: NONREACTIVE
Hepatitis B Surface Ag: NONREACTIVE

## 2021-04-11 LAB — LACTATE DEHYDROGENASE: LDH: 154 U/L (ref 98–192)

## 2021-04-11 MED ORDER — POTASSIUM CHLORIDE CRYS ER 20 MEQ PO TBCR: 20.0000 meq | EXTENDED_RELEASE_TABLET | Freq: Two times a day (BID) | ORAL | 0 refills | Status: DC

## 2021-04-11 NOTE — Telephone Encounter (Signed)
Called and notified Ms. Moynahan with negative CXR and [place] lower extremities. Pathology pending.

## 2021-04-11 NOTE — Progress Notes (Signed)
New pt visit, just saw Dr Theora Gianotti in GYN clinic as new pt as well.

## 2021-04-11 NOTE — Progress Notes (Addendum)
Gynecologic Oncology Consult Visit   Referring Provider: Burman Blacksmith Schermerhorn MD    Chief Concern: ascites and pelvic mass concerning for malignancy  Subjective:  Esti Demello is a 60 y.o. female who is seen in consultation from Dr. Ouida Sills for generalized abdominal pain with bloating, nausea, and constipation for 1 month.  03/23/2021 CT scan Large solid and cystic right ovarian mass measuring 14.1 x 18.1 x 16 cm highly suspicious for primary ovarian malignancy. 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 Moderate associated ascites. 2. Slightly small liver with mild nodular contour suggesting a degree of cirrhosis.  CA125  70.9  HE4 409  04/04/2021 clinic visit with Dr. Ouida Sills and he recommended consultation with Gyn Onc  04/09/2021 Successful image-guided paracentesis from the right lower abdomen. Yielded 6.3 liters of hazy yellow fluid. No immediate complications. Cytology results pending.   She presents today with her husband. She spends >50% of her waking time in a bed or chair. 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     Problem List: Patient Active Problem List   Diagnosis Date Noted   DDD (degenerative disc disease), cervical 03/02/2019   Glaucoma (increased eye pressure) 03/02/2019   Venous stasis 03/02/2019   Facet arthritis of cervical region 04/01/2016   Incomplete tear of left rotator cuff 01/15/2016   Rotator cuff tendinitis, left 01/15/2016   Essential hypertension 10/02/2015   Obstructive sleep apnea syndrome 10/02/2015   Recurrent major depressive disorder, in full remission ([place]) 10/02/2015   Contusion of left knee 10/17/2014    Past Medical History: See problem list Past Medical History:  Diagnosis Date   Arthritis    Glaucoma    Hypertension     Past Surgical History: Past Surgical History:  Procedure Laterality Date   ABDOMINAL HYSTERECTOMY N/A    may  have had left ovary removed   CESAREAN SECTION N/A    C-section x 2    Past Gynecologic History:  Menarche: 11  OB History: G2P2 OB History  Gravida Para Term Preterm AB Living  '2 2       2  ' SAB IAB Ectopic Multiple Live Births               # Outcome Date GA Lbr Len/2nd Weight Sex Delivery Anes PTL Lv  2 Para           1 Para             Obstetric Comments  C/Section x 2    Family History: Family History  Problem Relation Age of Onset   Breast cancer Mother 49   Breast cancer Other     Social History: Social History   Socioeconomic History   Marital status: Married    Spouse name: Not on file   Number of children: Not on file   Years of education: Not on file   Highest education level: Not on file  Occupational History   Not on file  Tobacco Use   Smoking status: Never   Smokeless tobacco: Never  Vaping Use   Vaping Use: Never used  Substance and Sexual Activity   Alcohol use: No   Drug use: No   Sexual activity: Not on file  Other Topics Concern   Not on file  Social History Narrative   Not on file   Social Determinants of Health   Financial Resource Strain: Not on file  Food Insecurity: Not  on file  Transportation Needs: Not on file  Physical Activity: Not on file  Stress: Not on file  Social Connections: Not on file  Intimate Partner Violence: Not on file    Allergies: Allergies  Allergen Reactions   Penicillins    Amlodipine Other (See Comments) and Rash    Not effective Not effective     Current Medications: Current Outpatient Medications  Medication Sig Dispense Refill   acetaminophen (TYLENOL) 500 MG tablet Take by mouth.     clonazePAM (KLONOPIN) 0.5 MG tablet Take by mouth.     DULoxetine (CYMBALTA) 30 MG capsule Take by mouth.     hydrochlorothiazide (HYDRODIURIL) 12.5 MG tablet Take 1 tablet by mouth daily.     losartan (COZAAR) 100 MG tablet Take 1 tablet by mouth daily.     meloxicam (MOBIC) 15 MG tablet Take 1 tablet  (15 mg total) by mouth daily. 30 tablet 1   Multiple Vitamin (MULTIVITAMIN WITH MINERALS) TABS tablet Take 1 tablet by mouth daily.     naproxen (NAPROSYN) 500 MG tablet Take 1 tablet (500 mg total) by mouth 2 (two) times daily with a meal. 20 tablet 2   timolol (TIMOPTIC) 0.5 % ophthalmic solution Place 1 drop into both eyes at bedtime.      traMADol (ULTRAM) 50 MG tablet Take 50 mg by mouth every 6 (six) hours as needed.     No current facility-administered medications for this visit.    Review of Systems General: weight gain o/w negative for fevers, or night sweats Skin: negative for changes in moles or sores or rash Eyes: negative for changes in vision HEENT: negative for change in hearing, tinnitus, voice changes Pulmonary: dyspnea,  o/w negative for orthopnea, productive cough, wheezing Cardiac: negative for palpitations, pain Gastrointestinal: nausea and constipation o/w negative for vomiting, diarrhea, hematemesis, hematochezia Genitourinary/Sexual: negative for dysuria, retention, hematuria, incontinence Ob/Gyn:  negative for abnormal bleeding, or pain Musculoskeletal: negative for pain, joint pain, back pain Hematology: negative for easy bruising, abnormal bleeding Neurologic/Psych: negative for headaches, seizures, paralysis, weakness, numbness   Objective:  Physical Examination:  BP 124/82   Pulse 98   Temp (!) 96.2 F (35.7 C)   Resp 20   Wt 196 lb 8 oz (89.1 kg)   SpO2 99%   BMI 37.13 kg/m    ECOG Performance Status: 2 - Symptomatic, <50% confined to bed  GENERAL: Patient pale appearing female in no acute distress HEENT:  PERRL, neck supple with midline trachea.  NODES:  No cervical, supraclavicular, axillary, or inguinal lymphadenopathy palpated.  LUNGS:  Clear to auscultation with decreased BS on the left.  No wheezes or rhonchi. HEART:  Regular rate and rhythm.  ABDOMEN:  Distended and prominent abdominal wall vasculature c/w caput medusa noted; distended in  the upper abdomen with ascites; mass on the right extending to the upper abdomen. Nontender.  EXTREMITIES:  2+ peripheral edema L > R NEURO:  Nonfocal. Well oriented.  Appropriate affect.  Pelvic: chaperoned by RN EGBUS: no lesions Cervix: surgically absent Vagina: no lesions, no discharge or bleeding. On BME 2 cm nodule at the upper vaginal cuff.  Uterus: surgically absent Adnexa: large mass on the right, difficult to palpate and rising into the upper abdomen. Unable to assess for mobility.  Rectovaginal: confirmatory, no obvious bowel involvement.   She had a difficult time with the exam and moving back up into a sitting position. She was short of breath.   Lab Review 03/13/2021   Ref  Range & Units 4 wk ago  WBC (White Blood Cell Count) 4.1 - 10.2 10^3/uL 10.8 High    RBC (Red Blood Cell Count) 4.04 - 5.48 10^6/uL 4.36   Hemoglobin 12.0 - 15.0 gm/dL 13.2   Hematocrit 35.0 - 47.0 % 39.8   MCV (Mean Corpuscular Volume) 80.0 - 100.0 fl 91.3   MCH (Mean Corpuscular Hemoglobin) 27.0 - 31.2 pg 30.3   MCHC (Mean Corpuscular Hemoglobin Concentration) 32.0 - 36.0 gm/dL 33.2   Platelet Count 150 - 450 10^3/uL 379   RDW-CV (Red Cell Distribution Width) 11.6 - 14.8 % 13.9   MPV (Mean Platelet Volume) 9.4 - 12.4 fl 10.6   Neutrophils 1.50 - 7.80 10^3/uL 9.03 High    Lymphocytes 1.00 - 3.60 10^3/uL 0.92 Low    Monocytes 0.00 - 1.50 10^3/uL 0.49   Eosinophils 0.00 - 0.55 10^3/uL 0.26   Basophils 0.00 - 0.09 10^3/uL 0.07   Neutrophil % 32.0 - 70.0 % 83.8 High    Lymphocyte % 10.0 - 50.0 % 8.5 Low    Monocyte % 4.0 - 13.0 % 4.5   Eosinophil % 1.0 - 5.0 % 2.4   Basophil% 0.0 - 2.0 % 0.6   Immature Granulocyte % <=0.7 % 0.2   Immature Granulocyte Count <=0.06 10^3/L 0.02   Resulting Agency  [facility]      Ref Range & Units 4 wk ago  Glucose 70 - 110 mg/dL 98   Sodium 136 - 145 mmol/L 139   Potassium 3.6 - 5.1 mmol/L 3.8   Chloride 97 - 109 mmol/L 101   Carbon Dioxide (CO2) 22.0 - 32.0  mmol/L 32.1 High    Urea Nitrogen (BUN) 7 - 25 mg/dL 8   Creatinine 0.6 - 1.1 mg/dL 0.8   Glomerular Filtration Rate (eGFR), MDRD Estimate >60 mL/min/1.73sq m 73   Calcium 8.7 - 10.3 mg/dL 9.8   AST  8 - 39 U/L 25   ALT  5 - 38 U/L 13   Alk Phos (alkaline Phosphatase) 34 - 104 U/L 87   Albumin 3.5 - 4.8 g/dL 3.6   Bilirubin, Total 0.3 - 1.2 mg/dL 0.8   Protein, Total 6.1 - 7.9 g/dL 6.4   A/G Ratio 1.0 - 5.0 gm/dL 1.3   Thyroid Stimulating Hormone (TSH) 0.450-5.330 uIU/ml uIU/mL 4.003    Ref Range & Units 4 wk ago  Color Yellow, Violet, Light Violet, Dark Violet Yellow   Clarity Clear Clear   Specific Gravity 1.000 - 1.030 <=1.005   pH, Urine 5.0 - 8.0 6.0   Protein, Urinalysis Negative, Trace mg/dL Negative   Glucose, Urinalysis Negative mg/dL Negative   Ketones, Urinalysis Negative mg/dL Negative   Blood, Urinalysis Negative Negative   Nitrite, Urinalysis Negative Negative   Leukocyte Esterase, Urinalysis Negative Negative   White Blood Cells, Urinalysis None Seen, 0-3 /hpf None Seen   Red Blood Cells, Urinalysis None Seen, 0-3 /hpf None Seen   Bacteria, Urinalysis None Seen /hpf Few Abnormal    Squamous Epithelial Cells, Urinalysis Rare, Few, None Seen /hpf Few     Ref Range & Units 4 wk ago  Cholesterol, Total 100 - 200 mg/dL 228 High    Triglyceride 35 - 199 mg/dL 148   HDL (High Density Lipoprotein) Cholesterol 35.0 - 85.0 mg/dL 41.3   LDL Calculated 0 - 130 mg/dL 157 High    VLDL Cholesterol mg/dL 30   Cholesterol/HDL Ratio  5.5    Hemoccult Negative  Radiologic Imaging: 03/23/2021   EXAM: CT ABDOMEN  AND PELVIS WITH CONTRAST   TECHNIQUE: Multidetector CT imaging of the abdomen and pelvis was performed using the standard protocol following bolus administration of intravenous contrast.   CONTRAST:  160m OMNIPAQUE IOHEXOL 300 MG/ML  SOLN   COMPARISON:  05/09/2010   FINDINGS: Lower chest: Mild linear scarring/atelectasis in the lung bases. No effusion or  consolidation. Couple indeterminate subcentimeter lymph nodes adjacent the distal esophagus.   Hepatobiliary: Prior cholecystectomy. Slightly small liver with mild nodular contour. Biliary tree is normal.   Pancreas: Normal.   Spleen: Normal.   Adrenals/Urinary Tract: Adrenal glands are normal. Kidneys are normal in size without hydronephrosis or nephrolithiasis. Ureters and bladder are normal.   Stomach/Bowel: Stomach and small bowel are normal. Appendix not well visualized. Minimal diverticulosis of the sigmoid colon which is otherwise unremarkable.   Vascular/Lymphatic: Minimal calcified plaque over the abdominal aorta which is normal caliber. No significant adenopathy.   Reproductive: Previous hysterectomy. Left ovary not visualized. Large solid and cystic right ovarian mass measuring approximately 14.1 x 18.1 x 16 cm and AP, transverse and craniocaudal dimensions. Findings highly suspicious for primary right ovarian malignancy. There is moderate associated ascites with mild nodularity along the anterior peritoneal surface and extending to the anterior midline of the anterior pelvic wall. Subtle nodular studding adjacent the border of the pelvic fluid. 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   Other: Minimal subcutaneous edema over the abdominal wall.   Musculoskeletal: No focal abnormality.   IMPRESSION: 1. 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 Moderate associated ascites. 2. Slightly small liver with mild nodular contour suggesting a degree of cirrhosis. 3. Minimal sigmoid diverticulosis. 4. Aortic atherosclerosis.   Aortic Atherosclerosis (ICD10-I70.0).      Assessment:  SLadona Rostenis a 60y.o. 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   Performance status 2   Possible cirrhosis  Bilateral LE edema L>R   Medical co-morbidities complicating care: Essential hypertension, Obstructive sleep apnea syndrome, Body mass index is 37.13 kg/m. Prior intra-abdominal surgery CESAREAN SECTION and hysterectomy.    Plan:   Problem List Items Addressed This Visit   None Visit Diagnoses     Malignant ascites    -  Primary   Relevant Orders   CEA   [place]Venous Img Lower Bilateral (DVT)   DG Chest 2 View   Ambulatory referral to Genetics   Complex cyst of right ovary       Relevant Orders   CEA   [place]ous Img Lower Bilateral (DVT)   DG Chest 2 View   Ambulatory referral to Genetics   Swelling of both lower extremities       Relevant Orders   [place]Venous Img Lower Bilateral (DVT)   DG Chest 2 View   Shortness of breath       Relevant Orders   DG Chest 2 View      Follow up on pathology; CEA; Genetic testing and HRD tumor testing if gyn malignancy confirmed; CXR to assess for pleural effusion; Bilateral LE ultrasound to assess for DVT.   Treatment approaches were discussed including primary surgical debulking followed by chemotherapy versus neoadjuvant chemotherapy followed by interval debulking surgery then additional chemotherapy.  The pros and cons of each approach were discussed. Neoadjuvant chemotherapy, usually associated with less blood loss, fewer postoperative complications,  and faster recovery.  We discussed the randomized trials that showed no difference in long-term outcomes between the two approaches. Moreover given her performance status, frailty, and possible cirrhosis plan for NACT and optimization of health status, and ask Dr. Tasia Catchings about work up for causes of cirrhosis.  She will be seeing Dr. Tasia Catchings today. This preliminary plan may change pending the cytology diagnosis and studies ordered today.   Follow up in 10-12 weeks if Dr. Tasia Catchings agrees with our plan.   The patient's diagnosis, an outline  of the further diagnostic and laboratory studies which will be required, the recommendation, and alternatives were discussed.  All questions were answered to the patient's satisfaction.  A total of 120 minutes were spent with the patient/family today; >50% was spent in education, counseling and coordination of care for probable advanced ovarian cancer.   Curren Mohrmann Gaetana Michaelis, MD    CC:  Dr. Renea Ee

## 2021-04-11 NOTE — Telephone Encounter (Signed)
Message received from Dr. Tasia Catchings:  lab showed low potassium.  Please tell patient to start oral potassium supplementation. Rx is being sent.  Pathology result is still pending. Further  management plan pending.  Patient notified.

## 2021-04-11 NOTE — Progress Notes (Signed)
Hematology/Oncology Consult note [facility] [phone] [phone]   Patient Care Team: Adin Hector, MD as PCP - General (Internal Medicine) Earlie Server, MD as Consulting Physician (Hematology and Oncology) Clent Jacks, RN as Oncology Nurse Navigator  REFERRING PROVIDER: Adin Hector, MD  CHIEF COMPLAINTS/REASON FOR VISIT:  Evaluation of complicated ovarian cyst, ascites  HISTORY OF PRESENTING ILLNESS:   Brittany Montoya is a  60 y.o.  female with PMH listed below was seen in consultation at the request of  Adin Hector, MD  for evaluation of complicated ovarian cyst, ascites  Patient has noticed generalized abdominal distention and bloating, nausea and constipation for the past months and  03/23/2021, CT abdomen pelvis with contrast showed large solid and cystic right ovarian mass measuring 14.1 x 18.1 x 16 cm highly suspicious for primary ovarian malignancy.  Evidence of peritoneal spread.  Moderate associated ascites. Slightly small liver with mild nodular contour suggesting a degree of cirrhosis.  04/03/2021, CA125 70.9, Hg 4 400 09.  04/04/2021, was seen by gynecology Dr. Ouida Sills.  And was referred to establish care with GYN oncology. 04/09/2021, patient underwent paracentesis and had 6.3 L of hazy yellow fluid removed.  Cytology is pending.  04/12/2021 patient was referred to see Dr. Theora Gianotti and me.  Patient also has noticed bilateral lower extremity edema, progressively worsening.  Fatigued, shortness of breath with exertion.  Denies any chest pain, cough.  Poor oral intake due to decreased appetite She was accompanied by her husband. Denies any alcohol use or previous hepatitis infection.  She is not aware about cirrhosis. She reports some symptom relief after the paracentesis.  Her abdomen seems to got better and getting worse again.  Review of Systems  Constitutional:  Positive for appetite change and  fatigue. Negative for chills and fever.  HENT:   Negative for hearing loss and voice change.   Eyes:  Negative for eye problems.  Respiratory:  Positive for shortness of breath. Negative for chest tightness and cough.   Cardiovascular:  Negative for chest pain.  Gastrointestinal:  Positive for abdominal distention and abdominal pain. Negative for blood in stool.  Endocrine: Negative for hot flashes.  Genitourinary:  Negative for difficulty urinating and frequency.   Musculoskeletal:  Negative for arthralgias.  Skin:  Negative for itching and rash.  Neurological:  Negative for extremity weakness.  Hematological:  Negative for adenopathy.  Psychiatric/Behavioral:  Negative for confusion.    MEDICAL HISTORY:  Past Medical History:  Diagnosis Date   Arthritis    Glaucoma    Hypertension     SURGICAL HISTORY: Past Surgical History:  Procedure Laterality Date   ABDOMINAL HYSTERECTOMY N/A    may have had left ovary removed   CESAREAN SECTION N/A    C-section x 2    SOCIAL HISTORY: Social History   Socioeconomic History   Marital status: Married    Spouse name: Not on file   Number of children: Not on file   Years of education: Not on file   Highest education level: Not on file  Occupational History   Not on file  Tobacco Use   Smoking status: Never   Smokeless tobacco: Never  Vaping Use   Vaping Use: Never used  Substance and Sexual Activity   Alcohol use: No   Drug use: No   Sexual activity: Not on file  Other Topics Concern   Not on file  Social History Narrative   Not  on file   Social Determinants of Health   Financial Resource Strain: Not on file  Food Insecurity: Not on file  Transportation Needs: Not on file  Physical Activity: Not on file  Stress: Not on file  Social Connections: Not on file  Intimate Partner Violence: Not on file    FAMILY HISTORY: Family History  Problem Relation Age of Onset   Breast cancer Mother 90   Breast cancer Other      ALLERGIES:  is allergic to penicillins and amlodipine.  MEDICATIONS:  Current Outpatient Medications  Medication Sig Dispense Refill   acetaminophen (TYLENOL) 500 MG tablet Take by mouth.     clonazePAM (KLONOPIN) 0.5 MG tablet Take by mouth.     DULoxetine (CYMBALTA) 30 MG capsule Take by mouth.     hydrochlorothiazide (HYDRODIURIL) 12.5 MG tablet Take 1 tablet by mouth daily.     losartan (COZAAR) 100 MG tablet Take 1 tablet by mouth daily.     meloxicam (MOBIC) 15 MG tablet Take 1 tablet (15 mg total) by mouth daily. 30 tablet 1   Multiple Vitamin (MULTIVITAMIN WITH MINERALS) TABS tablet Take 1 tablet by mouth daily.     naproxen (NAPROSYN) 500 MG tablet Take 1 tablet (500 mg total) by mouth 2 (two) times daily with a meal. 20 tablet 2   timolol (TIMOPTIC) 0.5 % ophthalmic solution Place 1 drop into both eyes at bedtime.      traMADol (ULTRAM) 50 MG tablet Take 50 mg by mouth every 6 (six) hours as needed.     No current facility-administered medications for this visit.     PHYSICAL EXAMINATION: ECOG PERFORMANCE STATUS: 2 - Symptomatic, <50% confined to bed Vitals:   04/11/21 0940  BP: 124/82  Pulse: 98  Resp: 18  Temp: (!) 96.2 F (35.7 C)  SpO2: 99%   Filed Weights   04/11/21 0940  Weight: 198 lb (89.8 kg)    Physical Exam Constitutional:      General: She is not in acute distress.    Appearance: She is obese.  HENT:     Head: Normocephalic and atraumatic.  Eyes:     General: No scleral icterus. Cardiovascular:     Rate and Rhythm: Normal rate and regular rhythm.     Heart sounds: Normal heart sounds.  Pulmonary:     Effort: Pulmonary effort is normal. No respiratory distress.     Breath sounds: Normal breath sounds. No wheezing.  Abdominal:     General: Bowel sounds are normal. There is distension.     Palpations: Abdomen is soft.  Musculoskeletal:        General: No deformity. Normal range of motion.     Cervical back: Normal range of motion and  neck supple.  Skin:    General: Skin is warm and dry.     Findings: No erythema or rash.  Neurological:     Mental Status: She is alert and oriented to person, place, and time. Mental status is at baseline.     Cranial Nerves: No cranial nerve deficit.     Coordination: Coordination normal.  Psychiatric:        Mood and Affect: Mood normal.    LABORATORY DATA:  I have reviewed the data as listed Lab Results  Component Value Date   WBC 9.6 04/11/2021   HGB 12.6 04/11/2021   HCT 37.9 04/11/2021   MCV 89.6 04/11/2021   PLT 337 04/11/2021   Recent Labs    04/11/21 1019  NA 135  K 2.8*  CL 98  CO2 30  GLUCOSE 113*  BUN 7  CREATININE 0.92  CALCIUM 9.1  GFRNONAA >60  PROT 6.2*  ALBUMIN 2.7*  AST 26  ALT 12  ALKPHOS 73  BILITOT 0.4   Iron/TIBC/Ferritin/ %Sat No results found for: IRON, TIBC, FERRITIN, IRONPCTSAT    RADIOGRAPHIC STUDIES: I have personally reviewed the radiological images as listed and agreed with the findings in the report. DG Chest 2 View  Result Date: 04/11/2021 CLINICAL DATA:  Shortness of breath. Ovarian mass. Malignant ascites. EXAM: CHEST - 2 VIEW COMPARISON:  One-view chest x-ray 03/06/2015. CT of the abdomen pelvis 03/23/2021 FINDINGS: Heart size is normal. Minimal airspace opacities are present both lung bases, likely reflecting atelectasis. No nodule or mass lesion is present. No significant effusions are present. Surgical clips are present at the gallbladder fossa. IMPRESSION: Minimal bibasilar airspace disease likely reflects atelectasis. No other significant airspace disease or effusion. Electronically Signed   By: San Morelle M.D.   On: 04/11/2021 12:04   CT ABDOMEN PELVIS W CONTRAST  Result Date: 03/24/2021 CLINICAL DATA:  Generalized abdominal pain with bloating. Left-sided abdominal pain with nausea and constipation 1 month. EXAM: CT ABDOMEN AND PELVIS WITH CONTRAST TECHNIQUE: Multidetector CT imaging of the abdomen and pelvis was  performed using the standard protocol following bolus administration of intravenous contrast. CONTRAST:  120m OMNIPAQUE IOHEXOL 300 MG/ML  SOLN COMPARISON:  05/09/2010 FINDINGS: Lower chest: Mild linear scarring/atelectasis in the lung bases. No effusion or consolidation. Couple indeterminate subcentimeter lymph nodes adjacent the distal esophagus. Hepatobiliary: Prior cholecystectomy. Slightly small liver with mild nodular contour. Biliary tree is normal. Pancreas: Normal. Spleen: Normal. Adrenals/Urinary Tract: Adrenal glands are normal. Kidneys are normal in size without hydronephrosis or nephrolithiasis. Ureters and bladder are normal. Stomach/Bowel: Stomach and small bowel are normal. Appendix not well visualized. Minimal diverticulosis of the sigmoid colon which is otherwise unremarkable. Vascular/Lymphatic: Minimal calcified plaque over the abdominal aorta which is normal caliber. No significant adenopathy. Reproductive: Previous hysterectomy. Left ovary not visualized. Large solid and cystic right ovarian mass measuring approximately 14.1 x 18.1 x 16 cm and AP, transverse and craniocaudal dimensions. Findings highly suspicious for primary right ovarian malignancy. There is moderate associated ascites with mild nodularity along the anterior peritoneal surface and extending to the anterior midline of the anterior pelvic wall. Subtle nodular studding adjacent the border of the pelvic fluid. 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 Other: Minimal subcutaneous edema over the abdominal wall. Musculoskeletal: No focal abnormality. IMPRESSION: 1. 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 Moderate associated ascites. 2. Slightly small liver with mild nodular contour suggesting a degree of cirrhosis. 3. Minimal sigmoid  diverticulosis. 4. Aortic atherosclerosis. Aortic Atherosclerosis (ICD10-I70.0). These results will be called to the ordering clinician or representative by the Radiologist Assistant, and communication documented in the PACS or [employer] Electronically Signed   By: DMarin OlpM.D.   On: 03/24/2021 14:34   [place]Venous Img Lower Bilateral (DVT)  Result Date: 04/11/2021 CLINICAL DATA:  Lumen ascites Bilateral lower extremity swelling for 1 month EXAM: BILATERAL LOWER EXTREMITY VENOUS DOPPLER ULTRASOUND TECHNIQUE: Gray-scale sonography with compression, as well as color and duplex ultrasound, were performed to evaluate the deep venous system(s) from the level of the common femoral vein through the popliteal and proximal calf veins.  COMPARISON:  None. FINDINGS: VENOUS Normal compressibility of the common femoral, superficial femoral, and popliteal veins, as well as the visualized calf veins. Visualized portions of profunda femoral vein and great saphenous vein unremarkable. No filling defects to suggest DVT on grayscale or color Doppler imaging. Doppler waveforms show normal direction of venous flow, normal respiratory plasticity and response to augmentation. OTHER None. Limitations: none IMPRESSION: No lower extremity DVT Electronically Signed   By: Miachel Roux M.D.   On: 04/11/2021 11:34   US Paracentesis  Result Date: 04/09/2021 INDICATION: Patient with recently noted right-sided ovarian mass and new onset ascites. Request to IR for diagnostic and therapeutic paracentesis. EXAM: ULTRASOUND GUIDED DIAGNOSTIC THERAPEUTIC PARACENTESIS MEDICATIONS: 8 mL% lidocaine COMPLICATIONS: None immediate. PROCEDURE: Informed written consent was obtained from the patient after a discussion of the risks, benefits and alternatives to treatment. A timeout was performed prior to the initiation of the procedure. Initial ultrasound scanning demonstrates a large amount of ascites within the right lower abdominal quadrant.  The right lower abdomen was prepped and draped in the usual sterile fashion. 1% lidocaine was used for local anesthesia. Following this, a 19 gauge, 10-cm, Yueh catheter was introduced. An ultrasound image was saved for documentation purposes. The paracentesis was performed. The catheter was removed and a dressing was applied. The patient tolerated the procedure well without immediate post procedural complication. FINDINGS: A total of approximately 6.3 L of hazy yellow fluid was removed. Samples were sent to the laboratory as requested by the clinical team. IMPRESSION: Successful ultrasound-guided paracentesis yielding 6.3 liters of peritoneal fluid. Read by Candiss Norse, PA-C Electronically Signed   By: Markus Daft M.D.   On: 04/09/2021 16:35      ASSESSMENT & PLAN:  1. Malignant ascites   2. Other cirrhosis of liver ([place])   3. Ovarian mass   4. Goals of care, counseling/discussion   5. Dyspnea on exertion    #Large ovarian cystic mass with ascites, peritoneal nodularity Elevated CA125 Clinically suspecting ovarian cancer. Cytology from paracentesis fluid is still pending at the time of dictation. Discussed with patient about possible neoadjuvant chemotherapy once pathology is confirmed.  Future debulking surgery if feasible.  #Dyspnea on exertion, bilateral lower extremity swelling Stat x-ray showed no pleural effusion. Stat bilateral lower extremity ultrasound showed no DVT. I will obtain stat CT angio PE protocol for further evaluation of her lung and rule out pulmonary embolism.  Patient is at high risk  #Discussed about option of Mediport placement.  Patient reports that she does not have good IV access and prefers to have Mediport placed in order to facilitate future chemotherapy treatments. We will set patient up for chemotherapy education with carboplatin and Taxol once pathology is confirmed  #Severe hypokalemia, potassium 2.8, recommend patient to start potassium chloride 20  mg twice daily  #Radiographic evidence of liver cirrhosis This is a new diagnosis for her. Check hepatitis panel.  CBC, CMP, CEA, CA125, LDH, I will check PT and PTT   Orders Placed This Encounter  Procedures   Comprehensive metabolic panel    Standing Status:   Future    Number of Occurrences:   1    Standing Expiration Date:   04/11/2022   CBC with Differential/Platelet    Standing Status:   Future    Number of Occurrences:   1    Standing Expiration Date:   04/11/2022   CA 125    Standing Status:   Future    Number of Occurrences:  1    Standing Expiration Date:   04/11/2022   Hepatitis panel, acute    Standing Status:   Future    Number of Occurrences:   1    Standing Expiration Date:   04/11/2022   Lactate dehydrogenase    Standing Status:   Future    Number of Occurrences:   1    Standing Expiration Date:   04/11/2022    All questions were answered. The patient knows to call the clinic with any problems questions or concerns.   Adin Hector, MD    Return of visit: To be determined.  Awaiting for pathology report. Thank you for this kind referral and the opportunity to participate in the care of this patient. A copy of today's note is routed to referring provider    Earlie Server, MD, PhD Hematology Oncology [facility] Pager- [phone] 04/11/2021

## 2021-04-12 ENCOUNTER — Other Ambulatory Visit: Payer: Self-pay

## 2021-04-12 ENCOUNTER — Emergency Department
Admission: EM | Admit: 2021-04-12 | Discharge: 2021-04-12 | Disposition: A | Discharge status: Home / Self Care | Payer: BC Managed Care – PPO | Attending: Emergency Medicine | Admitting: Emergency Medicine

## 2021-04-12 ENCOUNTER — Ambulatory Visit
Admission: RE | Admit: 2021-04-12 | Discharge: 2021-04-12 | Disposition: A | Discharge status: Home / Self Care | Payer: BC Managed Care – PPO | Source: Ambulatory Visit | Attending: Oncology | Admitting: Oncology

## 2021-04-12 DIAGNOSIS — R0602 Shortness of breath: Secondary | ICD-10-CM

## 2021-04-12 DIAGNOSIS — I2699 Other pulmonary embolism without acute cor pulmonale: Secondary | ICD-10-CM

## 2021-04-12 DIAGNOSIS — I2694 Multiple subsegmental pulmonary emboli without acute cor pulmonale: Secondary | ICD-10-CM | POA: Insufficient documentation

## 2021-04-12 DIAGNOSIS — Z8543 Personal history of malignant neoplasm of ovary: Secondary | ICD-10-CM | POA: Insufficient documentation

## 2021-04-12 DIAGNOSIS — N83201 Unspecified ovarian cyst, right side: Secondary | ICD-10-CM | POA: Diagnosis not present

## 2021-04-12 DIAGNOSIS — Z79899 Other long term (current) drug therapy: Secondary | ICD-10-CM | POA: Diagnosis not present

## 2021-04-12 DIAGNOSIS — I10 Essential (primary) hypertension: Secondary | ICD-10-CM | POA: Insufficient documentation

## 2021-04-12 HISTORY — DX: Other pulmonary embolism without acute cor pulmonale: I26.99

## 2021-04-12 LAB — TROPONIN I (HIGH SENSITIVITY): Troponin I (High Sensitivity): 5 ng/L (ref <18)

## 2021-04-12 LAB — BASIC METABOLIC PANEL
Anion gap: 10 (ref 5–15)
BUN: 8 mg/dL (ref 6–20)
CO2: 31 mmol/L (ref 22–32)
Calcium: 9.4 mg/dL (ref 8.9–10.3)
Chloride: 98 mmol/L (ref 98–111)
Creatinine, Ser: 0.89 mg/dL (ref 0.44–1.00)
GFR, Estimated: >60 mL/min (ref >60)
Glucose, Bld: 101 mg/dL — ABNORMAL HIGH (ref 70–99)
Potassium: 3.3 mmol/L — ABNORMAL LOW (ref 3.5–5.1)
Sodium: 139 mmol/L (ref 135–145)

## 2021-04-12 LAB — URINALYSIS, COMPLETE (UACMP) WITH MICROSCOPIC
Bilirubin Urine: NEGATIVE
Glucose, UA: NEGATIVE mg/dL
Hgb urine dipstick: NEGATIVE
Ketones, ur: NEGATIVE mg/dL
Leukocytes,Ua: NEGATIVE
Nitrite: NEGATIVE
Protein, ur: NEGATIVE mg/dL
Specific Gravity, Urine: 1.044 — ABNORMAL HIGH (ref 1.005–1.030)
pH: 8 (ref 5.0–8.0)

## 2021-04-12 LAB — CBC WITH DIFFERENTIAL/PLATELET
Abs Immature Granulocytes: 0.04 10*3/uL (ref 0.00–0.07)
Basophils Absolute: 0 10*3/uL (ref 0.0–0.1)
Basophils Relative: 0 %
Eosinophils Absolute: 0.2 10*3/uL (ref 0.0–0.5)
Eosinophils Relative: 2 %
HCT: 36.2 % (ref 36.0–46.0)
Hemoglobin: 12.2 g/dL (ref 12.0–15.0)
Immature Granulocytes: 0 %
Lymphocytes Relative: 9 %
Lymphs Abs: 1 10*3/uL (ref 0.7–4.0)
MCH: 30.3 pg (ref 26.0–34.0)
MCHC: 33.7 g/dL (ref 30.0–36.0)
MCV: 90 fL (ref 80.0–100.0)
Monocytes Absolute: 0.5 10*3/uL (ref 0.1–1.0)
Monocytes Relative: 5 %
Neutro Abs: 8.4 10*3/uL — ABNORMAL HIGH (ref 1.7–7.7)
Neutrophils Relative %: 84 %
Platelets: 342 10*3/uL (ref 150–400)
RBC: 4.02 MIL/uL (ref 3.87–5.11)
RDW: 13.7 % (ref 11.5–15.5)
WBC: 10.2 10*3/uL (ref 4.0–10.5)
nRBC: 0 % (ref 0.0–0.2)

## 2021-04-12 LAB — PROTIME-INR
INR: 1 (ref 0.8–1.2)
Prothrombin Time: 13.3 seconds (ref 11.4–15.2)

## 2021-04-12 LAB — CEA: CEA: 4 ng/mL (ref 0.0–4.7)

## 2021-04-12 LAB — CA 125: Cancer Antigen (CA) 125: 62 U/mL — ABNORMAL HIGH (ref 0.0–38.1)

## 2021-04-12 MED ORDER — APIXABAN 5 MG PO TABS
10.0000 mg | ORAL_TABLET | Freq: Once | ORAL | Status: AC
  Administered 2021-04-12: 10 mg via ORAL
  Filled 2021-04-12: qty 2

## 2021-04-12 MED ORDER — IOHEXOL 350 MG/ML SOLN
75.0000 mL | Freq: Once | INTRAVENOUS | Status: AC | PRN
  Administered 2021-04-12: 75 mL via INTRAVENOUS

## 2021-04-12 MED ORDER — APIXABAN (ELIQUIS) VTE STARTER PACK (10MG AND 5MG): ORAL_TABLET | ORAL | 0 refills | Status: DC

## 2021-04-12 NOTE — Telephone Encounter (Signed)
Per [place] from Dr. Tasia Catchings: xray showed no pleural effusion. no DVT. please arrange patient to have stat CT chest PE protocol thanks. Reason SOB.

## 2021-04-12 NOTE — Addendum Note (Signed)
Addended by: Evelina Dun on: 04/12/2021 11:44 AM   Modules accepted: Orders

## 2021-04-12 NOTE — Telephone Encounter (Signed)
per [place] from Dr. Tasia Catchings: I spoke with path, preliminary report is malignancy, pending IHC.  please also schedule her for chemo edu carbo and taxol next week (Q 3 weeks). check if we have room for chemo next week. I discuss these possibilities yesterday with her yesterday.please also send referral to [place] for port placement. discussed as well. Dr.Cintron notified.plan subjected to change pending final path, if you can book the chemo chair time for  here for now, that will be great  Referral for port placement has been faxed.

## 2021-04-12 NOTE — ED Provider Notes (Signed)
Emergency Medicine Provider Triage Evaluation Note  Brittany Montoya , a 60 y.o. female  was evaluated in triage.  Pt complains of presents to the ED after a positive outpatient CT chest angio. She had 6.3 L of ascites fluid removed on 7/25 due to a pelvic mass. She subsequently had a CTA ordered by her oncologist today. She received the called results before making it home, and advised to report directly to the ED. She does endorse SOB that is improved following her paracentesis, but remains.   Review of Systems  Positive: SOB Negative: CP, hemoptysis  Physical Exam  There were no vitals taken for this visit. Gen:   Awake, no distress  NAD Resp:  Normal effort  MSK:   Moves extremities without difficulty  Other:  ABD: distended, non-tender  Medical Decision Making  Medically screening exam initiated at 4:20 PM.  Appropriate orders placed.  Brittany Montoya was informed that the remainder of the evaluation will be completed by another provider, this initial triage assessment does not replace that evaluation, and the importance of remaining in the ED until their evaluation is complete.  Patient with recently diagnosed pelvic mass reporting SOB, with CTA evidence of pulmonary emboli.   Melvenia Needles, PA-C 04/12/21 1834    Naaman Plummer, MD 04/13/21 [phone]

## 2021-04-12 NOTE — ED Triage Notes (Signed)
Pt presents to the ED with c/o pulmonary embolism. Pt states that she has had intermittent episodes  of shortness of breath ongoing for 1 month. Pt was seen by PCP who ordered CT of lungs. Pt had CT scan performed today and was notified by PCP that the CT showed PE.

## 2021-04-12 NOTE — Telephone Encounter (Signed)
patient's husband call requesting a call back to discuss Dr. Collie Siad recommendation. Called Mr. Seppala back and explained to him why CT was needed and answered questions he had. Also told him about plan to start tx next week and referral for port placement. He verbalized understanding.

## 2021-04-12 NOTE — ED Provider Notes (Signed)
[facility] Emergency Department Provider Note   ____________________________________________   Event Date/Time   First MD Initiated Contact with Patient 04/12/21 1652     (approximate)  I have reviewed the triage vital signs and the nursing notes.   HISTORY  Chief Complaint No chief complaint on file.    HPI Aldena Vanblaricum is a 60 y.o. female with a past medical history as listed below as well as a recent diagnosis of ovarian cancer who presents for shortness of breath and after a CT scan today that showed bilateral pulmonary emboli  LOCATION: Chest DURATION: 1 week TIMING: Slightly worsened since onset SEVERITY: Moderate QUALITY: Shortness of breath CONTEXT: Worsening shortness of breath over the last week that prompted a CT scan today showing bilateral pulmonary emboli MODIFYING FACTORS: Dyspnea on exertion that partial relieves at rest ASSOCIATED SYMPTOMS: Dyspnea on exertion   Per medical record review, patient has newly diagnosed ovarian cancer with malignant ascites          Past Medical History:  Diagnosis Date   Arthritis    Glaucoma    Hypertension     Patient Active Problem List   Diagnosis Date Noted   Malignant ascites 04/11/2021   Other cirrhosis of liver ([place]) 04/11/2021   DDD (degenerative disc disease), cervical 03/02/2019   Glaucoma (increased eye pressure) 03/02/2019   Venous stasis 03/02/2019   Facet arthritis of cervical region 04/01/2016   Incomplete tear of left rotator cuff 01/15/2016   Rotator cuff tendinitis, left 01/15/2016   Essential hypertension 10/02/2015   Obstructive sleep apnea syndrome 10/02/2015   Recurrent major depressive disorder, in full remission ([place]) 10/02/2015   Contusion of left knee 10/17/2014    Past Surgical History:  Procedure Laterality Date   ABDOMINAL HYSTERECTOMY N/A    may have had left ovary removed   CESAREAN SECTION N/A    C-section x 2    Prior to Admission  medications   Medication Sig Start Date End Date Taking? Authorizing Provider  APIXABAN (ELIQUIS) VTE STARTER PACK ('10MG'$  AND '5MG'$ ) Take as directed on package: start with two-'5mg'$  tablets twice daily for 7 days. On day 8, switch to one-'5mg'$  tablet twice daily. 04/12/21  Yes Naaman Plummer, MD  acetaminophen (TYLENOL) 500 MG tablet Take by mouth.    [provider]  clonazePAM Bobbye [place]) 0.5 MG tablet Take by mouth. 04/04/21 05/04/21  [provider]  DULoxetine (CYMBALTA) 30 MG capsule Take by mouth. 04/20/14   [provider]  hydrochlorothiazide (HYDRODIURIL) 12.5 MG tablet Take 1 tablet by mouth daily. 12/03/14   [provider]  losartan (COZAAR) 100 MG tablet Take 1 tablet by mouth daily. 02/04/15   [provider]  meloxicam (MOBIC) 15 MG tablet Take 1 tablet (15 mg total) by mouth daily. 06/13/20   Edrick Kins, DPM  Multiple Vitamin (MULTIVITAMIN WITH MINERALS) TABS tablet Take 1 tablet by mouth daily.    [provider]  naproxen (NAPROSYN) 500 MG tablet Take 1 tablet (500 mg total) by mouth 2 (two) times daily with a meal. 03/06/15   Lavonia Drafts, MD  potassium chloride SA (KLOR-CON) 20 MEQ tablet Take 1 tablet (20 mEq total) by mouth 2 (two) times daily. 04/11/21   Earlie Server, MD  timolol (TIMOPTIC) 0.5 % ophthalmic solution Place 1 drop into both eyes at bedtime.  01/20/15   [provider]  traMADol (ULTRAM) 50 MG tablet Take 50 mg by mouth every 6 (six) hours as needed.  03/26/21   [provider]    Allergies Penicillins and Amlodipine  Family History  Problem Relation Age of Onset   Breast cancer Mother 16   Breast cancer Other     Social History Social History   Tobacco Use   Smoking status: Never   Smokeless tobacco: Never  Vaping Use   Vaping Use: Never used  Substance Use Topics   Alcohol use: No   Drug use: No    Review of Systems Constitutional: No fever/chills Eyes: No visual changes. ENT: No  sore throat. Cardiovascular: Denies chest pain. Respiratory: Endorses shortness of breath. Gastrointestinal: No abdominal pain.  No nausea, no vomiting.  No diarrhea. Genitourinary: Negative for dysuria. Musculoskeletal: Negative for acute arthralgias Skin: Negative for rash. Neurological: Negative for headaches, weakness/numbness/paresthesias in any extremity Psychiatric: Negative for suicidal ideation/homicidal ideation   ____________________________________________   PHYSICAL EXAM:  VITAL SIGNS: ED Triage Vitals  Enc Vitals Group     BP 04/12/21 1624 133/85     Pulse Rate 04/12/21 1624 81     Resp 04/12/21 1624 18     Temp 04/12/21 1624 98.9 F (37.2 C)     Temp Source 04/12/21 1624 Oral     SpO2 04/12/21 1624 100 %     Weight 04/12/21 1622 198 lb 6.6 oz (90 kg)     Height 04/12/21 1622 '5\' 1"'$  (1.549 m)     Head Circumference --      Peak Flow --      Pain Score 04/12/21 1622 0     Pain Loc --      Pain Edu? --      Excl. in [place]? --    Constitutional: Alert and oriented. Well appearing and in no acute distress. Eyes: Conjunctivae are normal. PERRL. Head: Atraumatic. Nose: No congestion/rhinnorhea. Mouth/Throat: Mucous membranes are moist. Neck: No stridor Cardiovascular: Grossly normal heart sounds.  Good peripheral circulation. Respiratory: Normal respiratory effort.  No retractions. Gastrointestinal: Soft and nontender.  Distended with fluid wave Musculoskeletal: No obvious deformities Neurologic:  Normal speech and language. No gross focal neurologic deficits are appreciated. Skin:  Skin is warm and dry. No rash noted. Psychiatric: Mood and affect are normal. Speech and behavior are normal.  ____________________________________________   LABS (all labs ordered are listed, but only abnormal results are displayed)  Labs Reviewed  BASIC METABOLIC PANEL - Abnormal; Notable for the following components:      Result Value   Potassium 3.3 (*)    Glucose, Bld 101  (*)    All other components within normal limits  CBC WITH DIFFERENTIAL/PLATELET - Abnormal; Notable for the following components:   Neutro Abs 8.4 (*)    All other components within normal limits  URINALYSIS, COMPLETE (UACMP) WITH MICROSCOPIC - Abnormal; Notable for the following components:   Color, Urine YELLOW (*)    APPearance CLEAR (*)    Specific Gravity, Urine 1.044 (*)    Bacteria, UA RARE (*)    All other components within normal limits  PROTIME-INR  TROPONIN I (HIGH SENSITIVITY)  TROPONIN I (HIGH SENSITIVITY)   ____________________________________________  EKG  ED ECG REPORT I, Naaman Plummer, the attending physician, personally viewed and interpreted this ECG.  Date: 04/12/2021 EKG Time: 1809 Rate: 83 Rhythm: normal sinus rhythm QRS Axis: normal Intervals: normal ST/T Wave abnormalities: normal Narrative Interpretation: no evidence of acute ischemia  ____________________________________________  RADIOLOGY  ED MD interpretation: CT angiography of the chest shows multiple bilateral lobar subsegmental pulmonary emboli without  any evidence of right heart strain  Official radiology report(s): CT Angio Chest Pulmonary Embolism (PE) W or WO Contrast  Result Date: 04/12/2021 CLINICAL DATA:  History of ovarian cancer. Now with progressive shortness of breath EXAM: CT ANGIOGRAPHY CHEST WITH CONTRAST TECHNIQUE: Multidetector CT imaging of the chest was performed using the standard protocol during bolus administration of intravenous contrast. Multiplanar CT image reconstructions and MIPs were obtained to evaluate the vascular anatomy. CONTRAST:  14m OMNIPAQUE IOHEXOL 350 MG/ML SOLN COMPARISON:  None. FINDINGS: Cardiovascular: Examination positive for multiple segmental and lobar pulmonary emboli. Examination is positive for acute emboli within the right middle lobe, right upper lobe, and lobar lingular pulmonary arteries. There also segmental and subsegmental filling defects  within the right lower lobe, left upper lobe and right upper lobe. Mild cardiac enlargement.  No pericardial effusion. Mediastinum/Nodes: No enlarged mediastinal, hilar, or axillary lymph nodes. Thyroid gland, trachea, and esophagus demonstrate no significant findings. Lungs/Pleura: No pleural effusion. No airspace consolidation or pneumothorax. Scar versus platelike atelectasis identified within the anterior basal right upper lobe. Upper Abdomen: Upper abdominal ascites is again noted. No acute findings. Musculoskeletal: No chest wall abnormality. No acute or significant osseous findings. Review of the MIP images confirms the above findings. IMPRESSION: 1. Examination is positive for multiple bilateral lobar, segmental and subsegmental pulmonary emboli. 2. Upper abdominal ascites. Critical Value/emergent results were called by telephone at the time of interpretation on 04/12/2021 at 4:10 pm to [facility], who verbally acknowledged these results. Electronically Signed   By: TKerby MoorsM.D.   On: 04/12/2021 16:11    ____________________________________________   PROCEDURES  Procedure(s) performed (including Critical Care):  .1-3 Lead EKG Interpretation  Date/Time: 04/12/2021 7:19 PM Performed by: BNaaman Plummer MD Authorized by: BNaaman Plummer MD     Interpretation: normal     ECG rate:  77   ECG rate assessment: normal     Rhythm: sinus rhythm     Ectopy: none     Conduction: normal     ____________________________________________   INITIAL IMPRESSION / ASSESSMENT AND PLAN / ED COURSE  As part of my medical decision making, I reviewed the following data within the electronic medical record, if available:  Nursing notes reviewed and incorporated, Labs reviewed, EKG interpreted, Old chart reviewed, Radiograph reviewed and Notes from prior ED visits reviewed and incorporated      Presentation concerning for pulmonary embolism. I have a lower suspicion but will still evaluate  for ACS, PTX, PNA, Tamponade with labs, imaging, and continued monitoring and reassessments.  Findings: CTA shows Non-Massive Pulmonary Embolus Interventions: Oral Eliquis Disposition: Discharge      ____________________________________________   FINAL CLINICAL IMPRESSION(S) / ED DIAGNOSES  Final diagnoses:  Multiple subsegmental pulmonary emboli without acute cor pulmonale (HCC)  Shortness of breath     ED Discharge Orders          Ordered    APIXABAN (ELIQUIS) VTE STARTER PACK ('10MG'$  AND '5MG'$ )        04/12/21 1916             Note:  This document was prepared using Dragon voice recognition software and may include unintentional dictation errors.    BNaaman Plummer MD 04/12/21 [phone]

## 2021-04-13 ENCOUNTER — Telehealth: Payer: Self-pay

## 2021-04-13 LAB — CYTOLOGY - NON PAP

## 2021-04-13 NOTE — Telephone Encounter (Signed)
Patient was notified to go to ED yesterday due to PE and was discharged home later that day on Eliquis.   Spoke to Mrs. Kovack this morning and pt states that she has not started Eliquis yet, her daughter will pick up today. Also, notiified her that Dr. Tasia Catchings and Dr. Peyton Najjar have talked about delaying port placement due to her starting Eliquis and therefor she will have her first tx via peripheral line. Pt verbalized understanding. Asked pt to give [place] a return call if she has any issues with Eliquis co-pay.

## 2021-04-15 ENCOUNTER — Encounter: Payer: Self-pay | Admitting: Oncology

## 2021-04-15 DIAGNOSIS — C569 Malignant neoplasm of unspecified ovary: Secondary | ICD-10-CM | POA: Insufficient documentation

## 2021-04-15 HISTORY — DX: Malignant neoplasm of unspecified ovary: C56.9

## 2021-04-15 MED ORDER — LIDOCAINE-PRILOCAINE 2.5-2.5 % EX CREA: TOPICAL_CREAM | CUTANEOUS | 3 refills | Status: DC

## 2021-04-15 MED ORDER — PROCHLORPERAZINE MALEATE 10 MG PO TABS: 10.0000 mg | ORAL_TABLET | Freq: Four times a day (QID) | ORAL | 1 refills | Status: DC | PRN

## 2021-04-15 MED ORDER — ONDANSETRON HCL 8 MG PO TABS: 8.0000 mg | ORAL_TABLET | Freq: Two times a day (BID) | ORAL | 1 refills | Status: DC | PRN

## 2021-04-15 NOTE — Addendum Note (Signed)
Addended by: Earlie Server on: 04/15/2021 01:17 PM   Modules accepted: Orders

## 2021-04-15 NOTE — Progress Notes (Signed)
START ON PATHWAY REGIMEN - Ovarian     A cycle is every 21 days:     Paclitaxel      Carboplatin   **Always confirm dose/schedule in your pharmacy ordering system**  Patient Characteristics: Preoperative or Nonsurgical Candidate (Clinical Staging), Newly Diagnosed, Neoadjuvant Therapy followed by Surgery BRCA Mutation Status: Awaiting Test Results Therapeutic Status: Preoperative or Nonsurgical Candidate (Clinical Staging) AJCC T Category: cT3 AJCC 8 Stage Grouping: Unknown AJCC N Category: cNX AJCC M Category: cM0 Therapy Plan: Neoadjuvant Therapy followed by Surgery Intent of Therapy: Curative Intent, Discussed with Patient 

## 2021-04-16 ENCOUNTER — Other Ambulatory Visit: Payer: Self-pay

## 2021-04-16 ENCOUNTER — Inpatient Hospital Stay: Payer: BC Managed Care – PPO | Attending: Oncology

## 2021-04-16 DIAGNOSIS — E876 Hypokalemia: Secondary | ICD-10-CM | POA: Insufficient documentation

## 2021-04-16 DIAGNOSIS — Z803 Family history of malignant neoplasm of breast: Secondary | ICD-10-CM | POA: Insufficient documentation

## 2021-04-16 DIAGNOSIS — R970 Elevated carcinoembryonic antigen [CEA]: Secondary | ICD-10-CM | POA: Insufficient documentation

## 2021-04-16 DIAGNOSIS — F419 Anxiety disorder, unspecified: Secondary | ICD-10-CM | POA: Insufficient documentation

## 2021-04-16 DIAGNOSIS — Z5111 Encounter for antineoplastic chemotherapy: Secondary | ICD-10-CM | POA: Insufficient documentation

## 2021-04-16 DIAGNOSIS — K7469 Other cirrhosis of liver: Secondary | ICD-10-CM | POA: Insufficient documentation

## 2021-04-16 DIAGNOSIS — R971 Elevated cancer antigen 125 [CA 125]: Secondary | ICD-10-CM | POA: Insufficient documentation

## 2021-04-16 DIAGNOSIS — I119 Hypertensive heart disease without heart failure: Secondary | ICD-10-CM | POA: Insufficient documentation

## 2021-04-16 DIAGNOSIS — R180 Malignant ascites: Secondary | ICD-10-CM | POA: Insufficient documentation

## 2021-04-16 DIAGNOSIS — I2699 Other pulmonary embolism without acute cor pulmonale: Secondary | ICD-10-CM | POA: Insufficient documentation

## 2021-04-16 DIAGNOSIS — K5900 Constipation, unspecified: Secondary | ICD-10-CM | POA: Insufficient documentation

## 2021-04-16 DIAGNOSIS — M129 Arthropathy, unspecified: Secondary | ICD-10-CM | POA: Insufficient documentation

## 2021-04-16 DIAGNOSIS — Z79899 Other long term (current) drug therapy: Secondary | ICD-10-CM | POA: Insufficient documentation

## 2021-04-16 DIAGNOSIS — R944 Abnormal results of kidney function studies: Secondary | ICD-10-CM | POA: Insufficient documentation

## 2021-04-16 DIAGNOSIS — Z7901 Long term (current) use of anticoagulants: Secondary | ICD-10-CM | POA: Insufficient documentation

## 2021-04-16 DIAGNOSIS — C561 Malignant neoplasm of right ovary: Secondary | ICD-10-CM | POA: Insufficient documentation

## 2021-04-18 ENCOUNTER — Telehealth: Payer: Self-pay

## 2021-04-18 ENCOUNTER — Ambulatory Visit: Payer: BC Managed Care – PPO

## 2021-04-18 NOTE — Telephone Encounter (Signed)
Called and reviewed pathology with Brittany Montoya and her spouse. She will talk with Dr. Tasia Catchings 8/4 regarding further paracentesis if needed and timing for additional tissue if needed.

## 2021-04-19 ENCOUNTER — Ambulatory Visit: Payer: BC Managed Care – PPO

## 2021-04-19 ENCOUNTER — Other Ambulatory Visit: Payer: Self-pay

## 2021-04-19 ENCOUNTER — Inpatient Hospital Stay: Payer: BC Managed Care – PPO

## 2021-04-19 ENCOUNTER — Inpatient Hospital Stay (HOSPITAL_BASED_OUTPATIENT_CLINIC_OR_DEPARTMENT_OTHER): Payer: BC Managed Care – PPO | Admitting: Oncology

## 2021-04-19 ENCOUNTER — Other Ambulatory Visit: Payer: BC Managed Care – PPO

## 2021-04-19 ENCOUNTER — Encounter: Payer: Self-pay | Admitting: Oncology

## 2021-04-19 VITALS — BP 109/75 | HR 91

## 2021-04-19 VITALS — BP 124/90 | HR 113 | Temp 98.9°F | Resp 18 | Wt 194.2 lb

## 2021-04-19 DIAGNOSIS — C561 Malignant neoplasm of right ovary: Secondary | ICD-10-CM

## 2021-04-19 DIAGNOSIS — Z7189 Other specified counseling: Secondary | ICD-10-CM | POA: Insufficient documentation

## 2021-04-19 DIAGNOSIS — R970 Elevated carcinoembryonic antigen [CEA]: Secondary | ICD-10-CM | POA: Diagnosis not present

## 2021-04-19 DIAGNOSIS — K5900 Constipation, unspecified: Secondary | ICD-10-CM | POA: Diagnosis not present

## 2021-04-19 DIAGNOSIS — C569 Malignant neoplasm of unspecified ovary: Secondary | ICD-10-CM

## 2021-04-19 DIAGNOSIS — Z79899 Other long term (current) drug therapy: Secondary | ICD-10-CM | POA: Diagnosis not present

## 2021-04-19 DIAGNOSIS — F419 Anxiety disorder, unspecified: Secondary | ICD-10-CM | POA: Diagnosis not present

## 2021-04-19 DIAGNOSIS — R180 Malignant ascites: Secondary | ICD-10-CM | POA: Diagnosis not present

## 2021-04-19 DIAGNOSIS — I2699 Other pulmonary embolism without acute cor pulmonale: Secondary | ICD-10-CM

## 2021-04-19 DIAGNOSIS — Z7901 Long term (current) use of anticoagulants: Secondary | ICD-10-CM | POA: Diagnosis not present

## 2021-04-19 DIAGNOSIS — Z803 Family history of malignant neoplasm of breast: Secondary | ICD-10-CM | POA: Diagnosis not present

## 2021-04-19 DIAGNOSIS — Z5111 Encounter for antineoplastic chemotherapy: Secondary | ICD-10-CM | POA: Diagnosis not present

## 2021-04-19 DIAGNOSIS — R944 Abnormal results of kidney function studies: Secondary | ICD-10-CM | POA: Diagnosis not present

## 2021-04-19 DIAGNOSIS — R971 Elevated cancer antigen 125 [CA 125]: Secondary | ICD-10-CM | POA: Diagnosis not present

## 2021-04-19 DIAGNOSIS — M129 Arthropathy, unspecified: Secondary | ICD-10-CM | POA: Diagnosis not present

## 2021-04-19 DIAGNOSIS — K7469 Other cirrhosis of liver: Secondary | ICD-10-CM | POA: Diagnosis not present

## 2021-04-19 DIAGNOSIS — F418 Other specified anxiety disorders: Secondary | ICD-10-CM | POA: Insufficient documentation

## 2021-04-19 DIAGNOSIS — E876 Hypokalemia: Secondary | ICD-10-CM | POA: Diagnosis not present

## 2021-04-19 DIAGNOSIS — I119 Hypertensive heart disease without heart failure: Secondary | ICD-10-CM | POA: Diagnosis not present

## 2021-04-19 LAB — COMPREHENSIVE METABOLIC PANEL
ALT: 15 U/L (ref 0–44)
AST: 31 U/L (ref 15–41)
Albumin: 2.8 g/dL — ABNORMAL LOW (ref 3.5–5.0)
Alkaline Phosphatase: 75 U/L (ref 38–126)
Anion gap: 7 (ref 5–15)
BUN: 8 mg/dL (ref 8–23)
CO2: 28 mmol/L (ref 22–32)
Calcium: 9.7 mg/dL (ref 8.9–10.3)
Chloride: 100 mmol/L (ref 98–111)
Creatinine, Ser: 1.13 mg/dL — ABNORMAL HIGH (ref 0.44–1.00)
GFR, Estimated: 55 mL/min — ABNORMAL LOW (ref >60)
Glucose, Bld: 112 mg/dL — ABNORMAL HIGH (ref 70–99)
Potassium: 4.2 mmol/L (ref 3.5–5.1)
Sodium: 135 mmol/L (ref 135–145)
Total Bilirubin: 0.4 mg/dL (ref 0.3–1.2)
Total Protein: 6.4 g/dL — ABNORMAL LOW (ref 6.5–8.1)

## 2021-04-19 LAB — CBC WITH DIFFERENTIAL/PLATELET
Abs Immature Granulocytes: 0.03 10*3/uL (ref 0.00–0.07)
Basophils Absolute: 0.1 10*3/uL (ref 0.0–0.1)
Basophils Relative: 1 %
Eosinophils Absolute: 0.3 10*3/uL (ref 0.0–0.5)
Eosinophils Relative: 3 %
HCT: 37.1 % (ref 36.0–46.0)
Hemoglobin: 12.5 g/dL (ref 12.0–15.0)
Immature Granulocytes: 0 %
Lymphocytes Relative: 10 %
Lymphs Abs: 0.9 10*3/uL (ref 0.7–4.0)
MCH: 29.6 pg (ref 26.0–34.0)
MCHC: 33.7 g/dL (ref 30.0–36.0)
MCV: 87.9 fL (ref 80.0–100.0)
Monocytes Absolute: 0.6 10*3/uL (ref 0.1–1.0)
Monocytes Relative: 6 %
Neutro Abs: 7.5 10*3/uL (ref 1.7–7.7)
Neutrophils Relative %: 80 %
Platelets: 479 10*3/uL — ABNORMAL HIGH (ref 150–400)
RBC: 4.22 MIL/uL (ref 3.87–5.11)
RDW: 14.1 % (ref 11.5–15.5)
WBC: 9.4 10*3/uL (ref 4.0–10.5)
nRBC: 0 % (ref 0.0–0.2)

## 2021-04-19 MED ORDER — LORAZEPAM 2 MG/ML IJ SOLN
0.5000 mg | Freq: Once | INTRAMUSCULAR | Status: AC | PRN
  Administered 2021-04-19: 0.5 mg via INTRAVENOUS
  Filled 2021-04-19: qty 1

## 2021-04-19 MED ORDER — FAMOTIDINE 20 MG IN NS 100 ML IVPB
20.0000 mg | Freq: Once | INTRAVENOUS | Status: AC
  Administered 2021-04-19: 20 mg via INTRAVENOUS
  Filled 2021-04-19: qty 100

## 2021-04-19 MED ORDER — DIPHENHYDRAMINE HCL 50 MG/ML IJ SOLN
50.0000 mg | Freq: Once | INTRAMUSCULAR | Status: AC
  Administered 2021-04-19: 50 mg via INTRAVENOUS
  Filled 2021-04-19: qty 1

## 2021-04-19 MED ORDER — SODIUM CHLORIDE 0.9 % IV SOLN
500.0000 mg | Freq: Once | INTRAVENOUS | Status: AC
  Administered 2021-04-19: 500 mg via INTRAVENOUS
  Filled 2021-04-19: qty 50

## 2021-04-19 MED ORDER — PALONOSETRON HCL INJECTION 0.25 MG/5ML
0.2500 mg | Freq: Once | INTRAVENOUS | Status: AC
  Administered 2021-04-19: 0.25 mg via INTRAVENOUS
  Filled 2021-04-19: qty 5

## 2021-04-19 MED ORDER — SODIUM CHLORIDE 0.9 % IV SOLN
150.0000 mg | Freq: Once | INTRAVENOUS | Status: AC
  Administered 2021-04-19: 150 mg via INTRAVENOUS
  Filled 2021-04-19: qty 5

## 2021-04-19 MED ORDER — SODIUM CHLORIDE 0.9 % IV SOLN
135.0000 mg/m2 | Freq: Once | INTRAVENOUS | Status: AC
  Administered 2021-04-19: 264 mg via INTRAVENOUS
  Filled 2021-04-19: qty 44

## 2021-04-19 MED ORDER — SODIUM CHLORIDE 0.9 % IV SOLN
Freq: Once | INTRAVENOUS | Status: AC
  Filled 2021-04-19: qty 250

## 2021-04-19 MED ORDER — SODIUM CHLORIDE 0.9 % IV SOLN
Freq: Once | INTRAVENOUS | Status: DC
  Filled 2021-04-19: qty 250

## 2021-04-19 MED ORDER — SODIUM CHLORIDE 0.9 % IV SOLN
10.0000 mg | Freq: Once | INTRAVENOUS | Status: AC
  Administered 2021-04-19: 10 mg via INTRAVENOUS
  Filled 2021-04-19: qty 1

## 2021-04-19 NOTE — Progress Notes (Signed)
Request for Paracentesis will be faxed to specials scheduling and pt will be notified with appt details once scheduled.

## 2021-04-19 NOTE — Patient Instructions (Signed)
[facility]  Discharge Instructions: Thank you for choosing [facility] to provide your oncology and hematology care.  If you have a lab appointment with the Cancer Center, please go directly to the Cancer Center and check in at the registration area.  Wear comfortable clothing and clothing appropriate for easy access to any Portacath or PICC line.   We strive to give you quality time with your provider. You may need to reschedule your appointment if you arrive late (15 or more minutes).  Arriving late affects you and other patients whose appointments are after yours.  Also, if you miss three or more appointments without notifying the office, you may be dismissed from the clinic at the provider's discretion.      For prescription refill requests, have your pharmacy contact our office and allow 72 hours for refills to be completed.        To help prevent nausea and vomiting after your treatment, we encourage you to take your nausea medication as directed.  BELOW ARE SYMPTOMS THAT SHOULD BE REPORTED IMMEDIATELY: *FEVER GREATER THAN 100.4 F (38 C) OR HIGHER *CHILLS OR SWEATING *NAUSEA AND VOMITING THAT IS NOT CONTROLLED WITH YOUR NAUSEA MEDICATION *UNUSUAL SHORTNESS OF BREATH *UNUSUAL BRUISING OR BLEEDING *URINARY PROBLEMS (pain or burning when urinating, or frequent urination) *BOWEL PROBLEMS (unusual diarrhea, constipation, pain near the anus) TENDERNESS IN MOUTH AND THROAT WITH OR WITHOUT PRESENCE OF ULCERS (sore throat, sores in mouth, or a toothache) UNUSUAL RASH, SWELLING OR PAIN  UNUSUAL VAGINAL DISCHARGE OR ITCHING   Items with * indicate a potential emergency and should be followed up as soon as possible or go to the Emergency Department if any problems should occur.  Please show the CHEMOTHERAPY ALERT CARD or IMMUNOTHERAPY ALERT CARD at check-in to the Emergency Department and triage nurse.  Should you have questions after your visit or  need to cancel or reschedule your appointment, please contact [facility]  [phone] and follow the prompts.  Office hours are 8:00 a.m. to 4:30 p.m. Monday - Friday. Please note that voicemails left after 4:00 p.m. may not be returned until the following business day.  We are closed weekends and major holidays. You have access to a nurse at all times for urgent questions. Please call the main number to the clinic [phone] and follow the prompts.  For any non-urgent questions, you may also contact your provider using MyChart. We now offer e-Visits for anyone 18 and older to request care online for non-urgent symptoms. For details visit [email or web address].   Also download the MyChart app! Go to the app store, search "MyChart", open the app, select [facility], and log in with your MyChart username and password.  Due to Covid, a mask is required upon entering the hospital/clinic. If you do not have a mask, one will be given to you upon arrival. For doctor visits, patients may have 1 support person aged 18 or older with them. For treatment visits, patients cannot have anyone with them due to current Covid guidelines and our immunocompromised population.  

## 2021-04-19 NOTE — Progress Notes (Signed)
MD aware of HR, will give ativan and fluids. Ok to proceed with treatment

## 2021-04-19 NOTE — Progress Notes (Signed)
Hematology/Oncology progress note [facility] [phone] [phone]   Patient Care Team: Adin Hector, MD as PCP - General (Internal Medicine) Earlie Server, MD as Consulting Physician (Hematology and Oncology) Clent Jacks, RN as Oncology Nurse Navigator  REFERRING PROVIDER: Adin Hector, MD  CHIEF COMPLAINTS/REASON FOR VISIT:  Malignant ascites, ovarian mass, pulmonary embolism  HISTORY OF PRESENTING ILLNESS:   Brittany Montoya is a  60 y.o.  female with PMH listed below was seen in consultation at the request of  Adin Hector, MD  for evaluation of complicated ovarian cyst, ascites  Patient has noticed generalized abdominal distention and bloating, nausea and constipation for the past months and  03/23/2021, CT abdomen pelvis with contrast showed large solid and cystic right ovarian mass measuring 14.1 x 18.1 x 16 cm highly suspicious for primary ovarian malignancy.  Evidence of peritoneal spread.  Moderate associated ascites. Slightly small liver with mild nodular contour suggesting a degree of cirrhosis.  04/03/2021, CA125 70.9, Hg 4 400 09.  04/04/2021, was seen by gynecology Dr. Ouida Sills.  And was referred to establish care with GYN oncology. 04/09/2021, patient underwent paracentesis and had 6.3 L of hazy yellow fluid removed.  Cytology is pending.  04/12/2021 patient was referred to see Dr. Theora Gianotti and me.  Patient also has noticed bilateral lower extremity edema, progressively worsening.  Fatigued, shortness of breath with exertion.  Denies any chest pain, cough.  Poor oral intake due to decreased appetite She was accompanied by her husband. Denies any alcohol use or previous hepatitis infection.  She is not aware about cirrhosis. She reports some symptom relief after the paracentesis.  Her abdomen seems to got better and getting worse again.    INTERVAL HISTORY Brittany Montoya is a 60 y.o. female who  has above history reviewed by me today presents for follow up visit for malignant ascites, ovarian mass, pulmonary embolism.  Problems and complaints are listed below: Accompanied by daughter. She takes Eliquis '10mg'$  BID now. Tolerates well.      Review of Systems  Constitutional:  Positive for appetite change and fatigue. Negative for chills and fever.  HENT:   Negative for hearing loss and voice change.   Eyes:  Negative for eye problems.  Respiratory:  Positive for shortness of breath. Negative for chest tightness and cough.   Cardiovascular:  Negative for chest pain.  Gastrointestinal:  Positive for abdominal distention. Negative for abdominal pain and blood in stool.  Endocrine: Negative for hot flashes.  Genitourinary:  Negative for difficulty urinating and frequency.   Musculoskeletal:  Negative for arthralgias.  Skin:  Negative for itching and rash.  Neurological:  Negative for extremity weakness.  Hematological:  Negative for adenopathy.  Psychiatric/Behavioral:  Negative for confusion. The patient is nervous/anxious.    MEDICAL HISTORY:  Past Medical History:  Diagnosis Date   Arthritis    Glaucoma    Hypertension    Ovarian cancer ([place]) 04/15/2021   Ovarian cancer ([place]) 04/15/2021    SURGICAL HISTORY: Past Surgical History:  Procedure Laterality Date   ABDOMINAL HYSTERECTOMY N/A    may have had left ovary removed   CESAREAN SECTION N/A    C-section x 2    SOCIAL HISTORY: Social History   Socioeconomic History   Marital status: Married    Spouse name: Not on file   Number of children: Not on file   Years of education: Not on file   Highest education level:  Not on file  Occupational History   Not on file  Tobacco Use   Smoking status: Never   Smokeless tobacco: Never  Vaping Use   Vaping Use: Never used  Substance and Sexual Activity   Alcohol use: No   Drug use: No   Sexual activity: Not on file  Other Topics Concern   Not on file  Social History  Narrative   Not on file   Social Determinants of Health   Financial Resource Strain: Not on file  Food Insecurity: Not on file  Transportation Needs: Not on file  Physical Activity: Not on file  Stress: Not on file  Social Connections: Not on file  Intimate Partner Violence: Not on file    FAMILY HISTORY: Family History  Problem Relation Age of Onset   Breast cancer Mother 86   Breast cancer Other     ALLERGIES:  is allergic to penicillins and amlodipine.  MEDICATIONS:  Current Outpatient Medications  Medication Sig Dispense Refill   acetaminophen (TYLENOL) 500 MG tablet Take by mouth.     albuterol (VENTOLIN HFA) 108 (90 Base) MCG/ACT inhaler Inhale into the lungs.     APIXABAN (ELIQUIS) VTE STARTER PACK ('10MG'$  AND '5MG'$ ) Take as directed on package: start with two-'5mg'$  tablets twice daily for 7 days. On day 8, switch to one-'5mg'$  tablet twice daily. 1 each 0   azelastine (ASTELIN) 0.1 % nasal spray Place into both nostrils.     clonazePAM (KLONOPIN) 0.5 MG tablet Take by mouth.     DULoxetine (CYMBALTA) 30 MG capsule Take by mouth.     hydrochlorothiazide (HYDRODIURIL) 12.5 MG tablet Take 1 tablet by mouth daily.     latanoprost (XALATAN) 0.005 % ophthalmic solution SMARTSIG:In Eye(s)     lidocaine-prilocaine (EMLA) cream Apply to affected area once 30 g 3   losartan (COZAAR) 50 MG tablet Take 50 mg by mouth daily.     meloxicam (MOBIC) 15 MG tablet Take 1 tablet (15 mg total) by mouth daily. 30 tablet 1   Multiple Vitamin (MULTIVITAMIN WITH MINERALS) TABS tablet Take 1 tablet by mouth daily.     naproxen (NAPROSYN) 500 MG tablet Take 1 tablet (500 mg total) by mouth 2 (two) times daily with a meal. 20 tablet 2   ondansetron (ZOFRAN) 8 MG tablet Take 1 tablet (8 mg total) by mouth 2 (two) times daily as needed for refractory nausea / vomiting. Start on day 3 after carboplatin chemo. 30 tablet 1   traMADol (ULTRAM) 50 MG tablet Take 50 mg by mouth every 6 (six) hours as needed.      traMADol (ULTRAM) 50 MG tablet Take by mouth.     chlorpheniramine-HYDROcodone (TUSSIONEX) 10-8 MG/5ML SUER Take by mouth. (Patient not taking: Reported on 04/19/2021)     losartan (COZAAR) 100 MG tablet Take 1 tablet by mouth daily. (Patient not taking: Reported on 04/19/2021)     ondansetron (ZOFRAN-ODT) 4 MG disintegrating tablet Take 4 mg by mouth every 8 (eight) hours as needed. (Patient not taking: Reported on 04/19/2021)     potassium chloride SA (KLOR-CON) 20 MEQ tablet Take 1 tablet (20 mEq total) by mouth 2 (two) times daily. (Patient not taking: Reported on 04/19/2021) 14 tablet 0   prochlorperazine (COMPAZINE) 10 MG tablet Take 1 tablet (10 mg total) by mouth every 6 (six) hours as needed (Nausea or vomiting). (Patient not taking: Reported on 04/19/2021) 30 tablet 1   timolol (TIMOPTIC) 0.5 % ophthalmic solution Place 1 drop into both  eyes at bedtime.  (Patient not taking: Reported on 04/19/2021)     triamcinolone cream (KENALOG) 0.1 % SMARTSIG:1 Application Topical 2-3 Times Daily (Patient not taking: Reported on 04/19/2021)     No current facility-administered medications for this visit.     PHYSICAL EXAMINATION: ECOG PERFORMANCE STATUS: 1 - Symptomatic but completely ambulatory Vitals:   04/19/21 0833  Resp: 18  Temp: 98.9 F (37.2 C)   Filed Weights   04/19/21 0833  Weight: 194 lb 3.6 oz (88.1 kg)    Physical Exam Constitutional:      General: She is not in acute distress.    Appearance: She is obese.  HENT:     Head: Normocephalic and atraumatic.  Eyes:     General: No scleral icterus. Cardiovascular:     Rate and Rhythm: Normal rate and regular rhythm.     Heart sounds: Normal heart sounds.  Pulmonary:     Effort: Pulmonary effort is normal. No respiratory distress.     Breath sounds: Normal breath sounds. No wheezing.  Abdominal:     General: Bowel sounds are normal. There is distension.     Palpations: Abdomen is soft.  Musculoskeletal:        General: No  deformity. Normal range of motion.     Cervical back: Normal range of motion and neck supple.  Skin:    General: Skin is warm and dry.     Findings: No erythema or rash.  Neurological:     Mental Status: She is alert and oriented to person, place, and time. Mental status is at baseline.     Cranial Nerves: No cranial nerve deficit.     Coordination: Coordination normal.  Psychiatric:        Mood and Affect: Mood normal.    LABORATORY DATA:  I have reviewed the data as listed Lab Results  Component Value Date   WBC 9.4 04/19/2021   HGB 12.5 04/19/2021   HCT 37.1 04/19/2021   MCV 87.9 04/19/2021   PLT 479 (H) 04/19/2021   Recent Labs    04/11/21 1019 04/12/21 1630  NA 135 139  K 2.8* 3.3*  CL 98 98  CO2 30 31  GLUCOSE 113* 101*  BUN 7 8  CREATININE 0.92 0.89  CALCIUM 9.1 9.4  GFRNONAA >60 >60  PROT 6.2*  --   ALBUMIN 2.7*  --   AST 26  --   ALT 12  --   ALKPHOS 73  --   BILITOT 0.4  --     Iron/TIBC/Ferritin/ %Sat No results found for: IRON, TIBC, FERRITIN, IRONPCTSAT    RADIOGRAPHIC STUDIES: I have personally reviewed the radiological images as listed and agreed with the findings in the report. DG Chest 2 View  Result Date: 04/11/2021 CLINICAL DATA:  Shortness of breath. Ovarian mass. Malignant ascites. EXAM: CHEST - 2 VIEW COMPARISON:  One-view chest x-ray 03/06/2015. CT of the abdomen pelvis 03/23/2021 FINDINGS: Heart size is normal. Minimal airspace opacities are present both lung bases, likely reflecting atelectasis. No nodule or mass lesion is present. No significant effusions are present. Surgical clips are present at the gallbladder fossa. IMPRESSION: Minimal bibasilar airspace disease likely reflects atelectasis. No other significant airspace disease or effusion. Electronically Signed   By: San Morelle M.D.   On: 04/11/2021 12:04   CT Angio Chest Pulmonary Embolism (PE) W or WO Contrast  Result Date: 04/12/2021 CLINICAL DATA:  History of ovarian  cancer. Now with progressive shortness of breath EXAM: CT  ANGIOGRAPHY CHEST WITH CONTRAST TECHNIQUE: Multidetector CT imaging of the chest was performed using the standard protocol during bolus administration of intravenous contrast. Multiplanar CT image reconstructions and MIPs were obtained to evaluate the vascular anatomy. CONTRAST:  53m OMNIPAQUE IOHEXOL 350 MG/ML SOLN COMPARISON:  None. FINDINGS: Cardiovascular: Examination positive for multiple segmental and lobar pulmonary emboli. Examination is positive for acute emboli within the right middle lobe, right upper lobe, and lobar lingular pulmonary arteries. There also segmental and subsegmental filling defects within the right lower lobe, left upper lobe and right upper lobe. Mild cardiac enlargement.  No pericardial effusion. Mediastinum/Nodes: No enlarged mediastinal, hilar, or axillary lymph nodes. Thyroid gland, trachea, and esophagus demonstrate no significant findings. Lungs/Pleura: No pleural effusion. No airspace consolidation or pneumothorax. Scar versus platelike atelectasis identified within the anterior basal right upper lobe. Upper Abdomen: Upper abdominal ascites is again noted. No acute findings. Musculoskeletal: No chest wall abnormality. No acute or significant osseous findings. Review of the MIP images confirms the above findings. IMPRESSION: 1. Examination is positive for multiple bilateral lobar, segmental and subsegmental pulmonary emboli. 2. Upper abdominal ascites. Critical Value/emergent results were called by telephone at the time of interpretation on 04/12/2021 at 4:10 pm to [facility], who verbally acknowledged these results. Electronically Signed   By: TKerby MoorsM.D.   On: 04/12/2021 16:11   CT ABDOMEN PELVIS W CONTRAST  Result Date: 03/24/2021 CLINICAL DATA:  Generalized abdominal pain with bloating. Left-sided abdominal pain with nausea and constipation 1 month. EXAM: CT ABDOMEN AND PELVIS WITH CONTRAST TECHNIQUE:  Multidetector CT imaging of the abdomen and pelvis was performed using the standard protocol following bolus administration of intravenous contrast. CONTRAST:  1022mOMNIPAQUE IOHEXOL 300 MG/ML  SOLN COMPARISON:  05/09/2010 FINDINGS: Lower chest: Mild linear scarring/atelectasis in the lung bases. No effusion or consolidation. Couple indeterminate subcentimeter lymph nodes adjacent the distal esophagus. Hepatobiliary: Prior cholecystectomy. Slightly small liver with mild nodular contour. Biliary tree is normal. Pancreas: Normal. Spleen: Normal. Adrenals/Urinary Tract: Adrenal glands are normal. Kidneys are normal in size without hydronephrosis or nephrolithiasis. Ureters and bladder are normal. Stomach/Bowel: Stomach and small bowel are normal. Appendix not well visualized. Minimal diverticulosis of the sigmoid colon which is otherwise unremarkable. Vascular/Lymphatic: Minimal calcified plaque over the abdominal aorta which is normal caliber. No significant adenopathy. Reproductive: Previous hysterectomy. Left ovary not visualized. Large solid and cystic right ovarian mass measuring approximately 14.1 x 18.1 x 16 cm and AP, transverse and craniocaudal dimensions. Findings highly suspicious for primary right ovarian malignancy. There is moderate associated ascites with mild nodularity along the anterior peritoneal surface and extending to the anterior midline of the anterior pelvic wall. Subtle nodular studding adjacent the border of the pelvic fluid. 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 Other: Minimal subcutaneous edema over the abdominal wall. Musculoskeletal: No focal abnormality. IMPRESSION: 1. 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 Moderate associated ascites. 2. Slightly small liver with mild nodular contour  suggesting a degree of cirrhosis. 3. Minimal sigmoid diverticulosis. 4. Aortic atherosclerosis. Aortic Atherosclerosis (ICD10-I70.0). These results will be called to the ordering clinician or representative by the Radiologist Assistant, and communication documented in the PACS or ClFrontier Oil CorporationElectronically Signed   By: DaMarin Olp.D.   On: 03/24/2021 14:34   [place]ous Img Lower Bilateral (DVT)  Result Date: 04/11/2021  CLINICAL DATA:  Lumen ascites Bilateral lower extremity swelling for 1 month EXAM: BILATERAL LOWER EXTREMITY VENOUS DOPPLER ULTRASOUND TECHNIQUE: Gray-scale sonography with compression, as well as color and duplex ultrasound, were performed to evaluate the deep venous system(s) from the level of the common femoral vein through the popliteal and proximal calf veins. COMPARISON:  None. FINDINGS: VENOUS Normal compressibility of the common femoral, superficial femoral, and popliteal veins, as well as the visualized calf veins. Visualized portions of profunda femoral vein and great saphenous vein unremarkable. No filling defects to suggest DVT on grayscale or color Doppler imaging. Doppler waveforms show normal direction of venous flow, normal respiratory plasticity and response to augmentation. OTHER None. Limitations: none IMPRESSION: No lower extremity DVT Electronically Signed   By: Miachel Roux M.D.   On: 04/11/2021 11:34   US Paracentesis  Result Date: 04/09/2021 INDICATION: Patient with recently noted right-sided ovarian mass and new onset ascites. Request to IR for diagnostic and therapeutic paracentesis. EXAM: ULTRASOUND GUIDED DIAGNOSTIC THERAPEUTIC PARACENTESIS MEDICATIONS: 8 mL% lidocaine COMPLICATIONS: None immediate. PROCEDURE: Informed written consent was obtained from the patient after a discussion of the risks, benefits and alternatives to treatment. A timeout was performed prior to the initiation of the procedure. Initial ultrasound scanning demonstrates a large amount  of ascites within the right lower abdominal quadrant. The right lower abdomen was prepped and draped in the usual sterile fashion. 1% lidocaine was used for local anesthesia. Following this, a 19 gauge, 10-cm, Yueh catheter was introduced. An ultrasound image was saved for documentation purposes. The paracentesis was performed. The catheter was removed and a dressing was applied. The patient tolerated the procedure well without immediate post procedural complication. FINDINGS: A total of approximately 6.3 L of hazy yellow fluid was removed. Samples were sent to the laboratory as requested by the clinical team. IMPRESSION: Successful ultrasound-guided paracentesis yielding 6.3 liters of peritoneal fluid. Read by Candiss Norse, PA-C Electronically Signed   By: Markus Daft M.D.   On: 04/09/2021 16:35      ASSESSMENT & PLAN:  1. 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 Goals of care, counseling/discussion   3. Other acute pulmonary embolism without acute cor pulmonale ([place])   4. Encounter for antineoplastic chemotherapy   5. Malignant ascites   6. 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  Clinically this is consistent with locally advanced Stage II/III Ovarian Cancer, no radiographic evidence of other primaries.  Again discussed about the rationale and side effects of neoadjuvant chemotherapy carboplatin and Taxol. Future debulking surgery if feasible. Labs are reviewed and discussed with patient. Proceed with cycle 1 Carboplatin AUC 5 and Taxol '135mg'$ /m2 Will refer to genetic counseling in the future  # Anxiety, she is on Cymbalta '30mg'$  daily. Recently being prescribed Clonazepam by gyn.  She appears very anxious and nervous today. Will give her Ativan 0.'5mg'$  x 1 prior to chemo.   #Bilateral pulmonary embolism, continue Eliquis.  Postpone  our plan of medi port placement due to new PE.   # Elevated creatinine, will give her 571m NS x 1 today. Encourage oral hydration.  # Malignant ascites, will schedule paracentesis.  # #Severe hypokalemia, potassium improved and normalized.   #Radiographic evidence of liver cirrhosis This is a new diagnosis for her. Normal PT, PTT, LFT. Negative hepatitis Will refer to establish with GI in the near future  No orders  of the defined types were placed in this encounter.   All questions were answered. The patient knows to call the clinic with any problems questions or concerns.  cc Adin Hector, MD    Return of visit: 1 week for evaluation of tolerability Thank you for this kind referral and the opportunity to participate in the care of this patient. A copy of today's note is routed to referring provider    Earlie Server, MD, PhD Hematology Oncology [facility] Pager- [phone] 04/19/2021

## 2021-04-20 ENCOUNTER — Ambulatory Visit
Admission: RE | Admit: 2021-04-20 | Discharge: 2021-04-20 | Disposition: A | Discharge status: Home / Self Care | Payer: BC Managed Care – PPO | Source: Ambulatory Visit | Attending: Oncology | Admitting: Oncology

## 2021-04-20 ENCOUNTER — Telehealth: Payer: Self-pay

## 2021-04-20 DIAGNOSIS — R180 Malignant ascites: Secondary | ICD-10-CM | POA: Insufficient documentation

## 2021-04-20 HISTORY — PX: PARACENTESIS: SHX844

## 2021-04-20 LAB — CA 125: Cancer Antigen (CA) 125: 61.1 U/mL — ABNORMAL HIGH (ref 0.0–38.1)

## 2021-04-20 NOTE — Procedures (Signed)
PROCEDURE SUMMARY:  Successful image-guided paracentesis from the right lower abdomen.  Yielded 3.6 liters of clear yellow fluid.  No immediate complications.  EBL < 1 mL Patient tolerated well.   Specimen was sent for labs.  Please see imaging section of Epic for full dictation.  Joaquim Nam PA-C 04/20/2021 2:47 PM

## 2021-04-20 NOTE — Telephone Encounter (Signed)
Telephone call to patient for follow up after receiving first infusion.   Patient states infusion went great.  States eating good but could be drinking more  fluids.   Denies any nausea or vomiting.  Encouraged patient to call for any concerns or questions.

## 2021-04-23 ENCOUNTER — Telehealth: Payer: Self-pay | Admitting: *Deleted

## 2021-04-23 NOTE — Telephone Encounter (Addendum)
Patient called reporting that she is having pain and that she has not had a bowel movement since before her chemotherapy treatment on Thursday. Please advise

## 2021-04-23 NOTE — Telephone Encounter (Signed)
Called Brittany Montoya to offer her a symptom management appointment. Brittany Montoya stated that she mainly wanted to know if she could take clonazepam and tramadol together at night. I informed her that it was not safe to do this, and these medications should be spaced out and not taken together. Brittany Montoya states that she still has some abdominal pain, but did not feel that she needed an appointment any sooner than already scheduled for 8/12 with Dr. Tasia Catchings. Encouraged her to continue stool softeners and ensure she drinks plenty of fluids. Instructed to call back if any issues before Friday's appointment. Brittany Montoya verbalized understanding.

## 2021-04-23 NOTE — Telephone Encounter (Signed)
Patient called back and said that she finally had bowel movement  today, but still has constipation. Has been taking Miralax. I recommended she add Senna 1 tablet twice a day and may increase to 2 tablet twice a day. Berry ealso reports that she is still having abdominal pain which goes down into her legs. Please advise

## 2021-04-24 LAB — CYTOLOGY - NON PAP

## 2021-04-25 ENCOUNTER — Other Ambulatory Visit: Payer: BC Managed Care – PPO

## 2021-04-25 NOTE — Progress Notes (Signed)
Tumor Board Documentation  Brittany Montoya was presented by Mariea Clonts, RN at our Tumor Board on 04/25/2021, which included representatives from medical oncology, surgical oncology, genetics, navigation, pathology.  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 Differentials discussed.) with history of the following treatments: none.  Additionally, we reviewed previous medical and familial history, history of present illness, and recent lab results along with all available histopathologic and imaging studies. The tumor board considered available treatment options and made the following recommendations: Neoadjuvant chemotherapy, Surgery Chemotherapy to shrink the mass and then consider for surgery. Tumor testing was recommended. Referral to genetics sent.  The following procedures/referrals were also placed: No orders of the defined types were placed in this encounter.   Tumor board is a meeting of clinicians from various specialty areas who evaluate and discuss patients for whom a multidisciplinary approach is being considered. Final determinations in the plan of care are those of the provider(s). The responsibility for follow up of recommendations given during tumor board is that of the provider.   Today's extended care, comprehensive team conference, Brittany Montoya was not present for the discussion and was not examined.

## 2021-04-26 ENCOUNTER — Encounter: Payer: BC Managed Care – PPO | Admitting: Licensed Clinical Social Worker

## 2021-04-26 ENCOUNTER — Other Ambulatory Visit: Payer: BC Managed Care – PPO

## 2021-04-27 ENCOUNTER — Inpatient Hospital Stay: Payer: BC Managed Care – PPO

## 2021-04-27 ENCOUNTER — Other Ambulatory Visit: Payer: Self-pay

## 2021-04-27 ENCOUNTER — Ambulatory Visit: Payer: BC Managed Care – PPO | Admitting: Nurse Practitioner

## 2021-04-27 ENCOUNTER — Encounter: Payer: Self-pay | Admitting: Oncology

## 2021-04-27 ENCOUNTER — Inpatient Hospital Stay (HOSPITAL_BASED_OUTPATIENT_CLINIC_OR_DEPARTMENT_OTHER): Payer: BC Managed Care – PPO | Admitting: Oncology

## 2021-04-27 ENCOUNTER — Ambulatory Visit: Payer: BC Managed Care – PPO

## 2021-04-27 ENCOUNTER — Other Ambulatory Visit: Payer: BC Managed Care – PPO

## 2021-04-27 ENCOUNTER — Encounter: Payer: Self-pay | Admitting: Licensed Clinical Social Worker

## 2021-04-27 ENCOUNTER — Inpatient Hospital Stay (HOSPITAL_BASED_OUTPATIENT_CLINIC_OR_DEPARTMENT_OTHER): Payer: BC Managed Care – PPO | Admitting: Licensed Clinical Social Worker

## 2021-04-27 VITALS — BP 125/83 | HR 92 | Temp 99.6°F | Resp 18 | Wt 185.0 lb

## 2021-04-27 DIAGNOSIS — I2699 Other pulmonary embolism without acute cor pulmonale: Secondary | ICD-10-CM | POA: Diagnosis not present

## 2021-04-27 DIAGNOSIS — C561 Malignant neoplasm of right ovary: Secondary | ICD-10-CM

## 2021-04-27 DIAGNOSIS — K7469 Other cirrhosis of liver: Secondary | ICD-10-CM

## 2021-04-27 DIAGNOSIS — R180 Malignant ascites: Secondary | ICD-10-CM | POA: Diagnosis not present

## 2021-04-27 DIAGNOSIS — Z803 Family history of malignant neoplasm of breast: Secondary | ICD-10-CM | POA: Insufficient documentation

## 2021-04-27 DIAGNOSIS — F419 Anxiety disorder, unspecified: Secondary | ICD-10-CM

## 2021-04-27 DIAGNOSIS — C569 Malignant neoplasm of unspecified ovary: Secondary | ICD-10-CM

## 2021-04-27 LAB — CBC WITH DIFFERENTIAL/PLATELET
Abs Immature Granulocytes: 0.05 10*3/uL (ref 0.00–0.07)
Basophils Absolute: 0.1 10*3/uL (ref 0.0–0.1)
Basophils Relative: 1 %
Eosinophils Absolute: 0.2 10*3/uL (ref 0.0–0.5)
Eosinophils Relative: 4 %
HCT: 34.6 % — ABNORMAL LOW (ref 36.0–46.0)
Hemoglobin: 11.3 g/dL — ABNORMAL LOW (ref 12.0–15.0)
Immature Granulocytes: 1 %
Lymphocytes Relative: 15 %
Lymphs Abs: 0.7 10*3/uL (ref 0.7–4.0)
MCH: 29 pg (ref 26.0–34.0)
MCHC: 32.7 g/dL (ref 30.0–36.0)
MCV: 88.7 fL (ref 80.0–100.0)
Monocytes Absolute: 0.2 10*3/uL (ref 0.1–1.0)
Monocytes Relative: 5 %
Neutro Abs: 3.4 10*3/uL (ref 1.7–7.7)
Neutrophils Relative %: 74 %
Platelets: 335 10*3/uL (ref 150–400)
RBC: 3.9 MIL/uL (ref 3.87–5.11)
RDW: 13.9 % (ref 11.5–15.5)
WBC: 4.6 10*3/uL (ref 4.0–10.5)
nRBC: 0 % (ref 0.0–0.2)

## 2021-04-27 LAB — COMPREHENSIVE METABOLIC PANEL
ALT: 21 U/L (ref 0–44)
AST: 41 U/L (ref 15–41)
Albumin: 2.9 g/dL — ABNORMAL LOW (ref 3.5–5.0)
Alkaline Phosphatase: 74 U/L (ref 38–126)
Anion gap: 8 (ref 5–15)
BUN: 10 mg/dL (ref 8–23)
CO2: 27 mmol/L (ref 22–32)
Calcium: 9.1 mg/dL (ref 8.9–10.3)
Chloride: 101 mmol/L (ref 98–111)
Creatinine, Ser: 0.86 mg/dL (ref 0.44–1.00)
GFR, Estimated: >60 mL/min (ref >60)
Glucose, Bld: 136 mg/dL — ABNORMAL HIGH (ref 70–99)
Potassium: 3.2 mmol/L — ABNORMAL LOW (ref 3.5–5.1)
Sodium: 136 mmol/L (ref 135–145)
Total Bilirubin: 0.3 mg/dL (ref 0.3–1.2)
Total Protein: 6 g/dL — ABNORMAL LOW (ref 6.5–8.1)

## 2021-04-27 MED ORDER — POTASSIUM CHLORIDE CRYS ER 20 MEQ PO TBCR: 20.0000 meq | EXTENDED_RELEASE_TABLET | Freq: Every day | ORAL | 0 refills | Status: DC

## 2021-04-27 MED ORDER — CLONAZEPAM 0.5 MG PO TABS: 0.5000 mg | ORAL_TABLET | Freq: Every day | ORAL | 0 refills | Status: DC | PRN

## 2021-04-27 NOTE — Progress Notes (Signed)
REFERRING PROVIDER: Gillis Ends, MD [facility] [place],  [place] [address]  PRIMARY PROVIDER:  Adin Hector, MD  PRIMARY REASON FOR VISIT:  1. Family history of breast cancer   2. Malignant neoplasm of right ovary (HCC)      HISTORY OF PRESENT ILLNESS:   Ms. Klute, a 60 y.o. female, was seen for a [place] cancer genetics consultation at the request of Dr. Theora Gianotti due to a personal and family history of cancer.  Ms. Yetman presents to clinic today to discuss the possibility of a hereditary predisposition to cancer, genetic testing, and to further clarify her future cancer risks, as well as potential cancer risks for family members.   In 2022, at the age of 44, Ms. Petre was diagnosed with ovarian cancer. This is currently being treated with chemotherapy.   CANCER HISTORY:  Oncology History  Ovarian cancer ([place])  04/15/2021 Initial Diagnosis   Ovarian cancer ([place])   04/18/2021 Cancer Staging   Staging form: Ovary, Fallopian Tube, and Primary Peritoneal Carcinoma, AJCC 8th Edition - Clinical stage from 04/18/2021: FIGO Stage II, calculated as Stage Unknown (cT2, cNX, cM0) - Signed by Earlie Server, MD on 04/19/2021 Stage prefix: Initial diagnosis   04/19/2021 -  Chemotherapy    Patient is on Treatment Plan: OVARIAN CARBOPLATIN (AUC 6) / PACLITAXEL (175) Q21D X 6 CYCLES          RISK FACTORS:  Menarche was at age 16.  First live birth at age 89.  OCP use for approximately 0 years.  Ovaries intact: has R ovary.  Hysterectomy: yes.  Menopausal status: postmenopausal.  HRT use: 0 years. Colonoscopy: yes;  has had 3 or 4, has had polyps, reports less than 10 . Mammogram within the last year: yes, says she is due for one soon. Number of breast biopsies: 0.   Past Medical History:  Diagnosis Date   Arthritis    Family history of breast cancer    Glaucoma    Hypertension    Ovarian cancer ([place]) 04/15/2021   Ovarian cancer ([place])  04/15/2021    Past Surgical History:  Procedure Laterality Date   ABDOMINAL HYSTERECTOMY N/A    may have had left ovary removed   CESAREAN SECTION N/A    C-section x 2    Social History   Socioeconomic History   Marital status: Married    Spouse name: Not on file   Number of children: Not on file   Years of education: Not on file   Highest education level: Not on file  Occupational History   Not on file  Tobacco Use   Smoking status: Never   Smokeless tobacco: Never  Vaping Use   Vaping Use: Never used  Substance and Sexual Activity   Alcohol use: No   Drug use: No   Sexual activity: Not on file  Other Topics Concern   Not on file  Social History Narrative   Not on file   Social Determinants of Health   Financial Resource Strain: Not on file  Food Insecurity: Not on file  Transportation Needs: Not on file  Physical Activity: Not on file  Stress: Not on file  Social Connections: Not on file     FAMILY HISTORY:  We obtained a detailed, 4-generation family history.  Significant diagnoses are listed below: Family History  Problem Relation Age of Onset   Breast cancer Mother 7   Breast cancer Maternal Aunt  d. under 16   Ms. Detzel has 2 daughters, 34 and 61, no cancers. She does not have siblings.  Ms. Bobst mother had breast cancer at 69, again in her 61s, and died at 4. Patient had 1 maternal aunt. She also had breast cancer and died at a young age, under 27. Patient has 2 maternal cousins, neither have had cancer. Maternal grandmother died at 86. Patient does not have information about grandfather, she also knows he had other children (patient's half-siblings) but doesn't have information about them.  Ms. Beaufort father died at 52, he was an only child. Paternal grandmother died in her 21s-90s, grandfather died in his 62s-60s.   Ms. Mudgett is unaware of previous family history of genetic testing for hereditary cancer risks. Patient's  maternal ancestors are of [place] descent, and paternal ancestors are of [place] descent. There is no reported Ashkenazi Jewish ancestry. There is no known consanguinity.   GENETIC COUNSELING ASSESSMENT: Ms. Barrientes is a 60 y.o. female with a personal history of ovarian cancer and family history of breast cancer which is somewhat suggestive of a hereditary cancer syndrome and predisposition to cancer. We, therefore, discussed and recommended the following at today's visit.   DISCUSSION: We discussed that approximately 15-20% of ovarian cancer is hereditary. Most cases of hereditary ovarian cancer are associated with BRCA1/BRCA2 genes, which also increase the risk for breast cancer. There are other genes associated with hereditary breast/ovarian cancer as well. Cancers and risks are gene specific.  We discussed that testing is beneficial for several reasons including  knowing if an individual is a candidate for certain targeted therapies, knowing about other cancer risks, identifying potential screening and risk-reduction options that may be appropriate, and to understand if other family members could be at risk for cancer and allow them to undergo genetic testing.   We reviewed the characteristics, features and inheritance patterns of hereditary cancer syndromes. We also discussed genetic testing, including the appropriate family members to test, the process of testing, insurance coverage and turn-around-time for results. We discussed the implications of a negative, positive and/or variant of uncertain significant result. We recommended Ms. Sharlett Iles pursue genetic testing for the [facility] myRisk+HRD gene panel.   We discussed that genetic testing through [facility] will test for hereditary mutations that could explain her diagnosis of cancer.  However, homologous recombination testing (HRD) is genetic testing performed on the tumor that can determine genetic changes that could influence her management such as  eligibility for targeted therapies.  HRD testing is performed in tandem with genetic testing, and typically at no additional cost.    Based on Ms. Vanness's personal and family history of cancer, she meets medical criteria for genetic testing. Despite that she meets criteria, she may still have an out of pocket cost. We discussed that if her out of pocket cost for testing is over $100, the laboratory will call and confirm whether she wants to proceed with testing.  If the out of pocket cost of testing is less than $100 she will be billed by the genetic testing laboratory.   PLAN: After considering the risks, benefits, and limitations, Ms. Sharlett Iles provided informed consent to pursue genetic testing and the blood sample was sent to [facility] for analysis of the [facility] +HRD panel. Results should be available within approximately 2-3 weeks' time, at which point they will be disclosed by telephone to Ms. Jacobson, as will any additional recommendations warranted by these results. Ms. Deschamps will receive a summary of her genetic  counseling visit and a copy of her results once available. This information will also be available in Epic.   Ms. Kappes questions were answered to her satisfaction today. Our contact information was provided should additional questions or concerns arise. Thank you for the referral and allowing [place] to share in the care of your patient.   Faith Rogue, MS, [facility] Genetic Counselor [email or web address] Phone: [phone]  The patient was seen for a total of 30 minutes in face-to-face genetic counseling.  Patient was seen with her daughter, Kyra Searles. Dr. Grayland Ormond was available for discussion regarding this case.   _______________________________________________________________________ For Office Staff:  Number of people involved in session: 2 Was an Intern/ student involved with case: no

## 2021-04-27 NOTE — Progress Notes (Signed)
Hematology/Oncology progress note [facility] [phone] [phone]   Patient Care Team: Adin Hector, MD as PCP - General (Internal Medicine) Earlie Server, MD as Consulting Physician (Hematology and Oncology) Clent Jacks, RN as Oncology Nurse Navigator  REFERRING PROVIDER: Adin Hector, MD  CHIEF COMPLAINTS/REASON FOR VISIT:  Malignant ascites, ovarian mass, pulmonary embolism  HISTORY OF PRESENTING ILLNESS:   Brittany Montoya is a  60 y.o.  female with PMH listed below was seen in consultation at the request of  Adin Hector, MD  for evaluation of complicated ovarian cyst, ascites  Patient has noticed generalized abdominal distention and bloating, nausea and constipation for the past months and  03/23/2021, CT abdomen pelvis with contrast showed large solid and cystic right ovarian mass measuring 14.1 x 18.1 x 16 cm highly suspicious for primary ovarian malignancy.  Evidence of peritoneal spread.  Moderate associated ascites. Slightly small liver with mild nodular contour suggesting a degree of cirrhosis.  04/03/2021, CA125 70.9, Hg 4 400 09.  04/04/2021, was seen by gynecology Dr. Ouida Sills.  And was referred to establish care with GYN oncology. 04/09/2021, patient underwent paracentesis and had 6.3 L of hazy yellow fluid removed.  Cytology is pending.  04/12/2021 patient was referred to see Dr. Theora Gianotti and me.  Patient also has noticed bilateral lower extremity edema, progressively worsening.  Fatigued, shortness of breath with exertion.  Denies any chest pain, cough.  Poor oral intake due to decreased appetite She was accompanied by her husband. Denies any alcohol use or previous hepatitis infection.  She is not aware about cirrhosis. She reports some symptom relief after the paracentesis.  Her abdomen seems to got better and getting worse again.    INTERVAL HISTORY Brittany Montoya is a 60 y.o. female who  has above history reviewed by me today presents for follow up visit for malignant ascites, ovarian mass, pulmonary embolism.  Problems and complaints are listed below: Accompanied by daughter.  On Eliquis '5mg'$  BID, no bleeding event.  She had nausea, bloating, constipation symptoms earlier this week, symptoms are better after she finally passed bowel movement.  SOB is slightly better.      Review of Systems  Constitutional:  Positive for appetite change and fatigue. Negative for chills and fever.  HENT:   Negative for hearing loss and voice change.   Eyes:  Negative for eye problems.  Respiratory:  Positive for shortness of breath. Negative for chest tightness and cough.   Cardiovascular:  Negative for chest pain.  Gastrointestinal:  Positive for abdominal distention and constipation. Negative for abdominal pain and blood in stool.  Endocrine: Negative for hot flashes.  Genitourinary:  Negative for difficulty urinating and frequency.   Musculoskeletal:  Negative for arthralgias.  Skin:  Negative for itching and rash.  Neurological:  Negative for extremity weakness.  Hematological:  Negative for adenopathy.  Psychiatric/Behavioral:  Negative for confusion. The patient is nervous/anxious.    MEDICAL HISTORY:  Past Medical History:  Diagnosis Date   Arthritis    Family history of breast cancer    Glaucoma    Hypertension    Ovarian cancer ([place]) 04/15/2021   Ovarian cancer ([place]) 04/15/2021    SURGICAL HISTORY: Past Surgical History:  Procedure Laterality Date   ABDOMINAL HYSTERECTOMY N/A    may have had left ovary removed   CESAREAN SECTION N/A    C-section x 2    SOCIAL HISTORY: Social History   Socioeconomic History  Marital status: Married    Spouse name: Not on file   Number of children: Not on file   Years of education: Not on file   Highest education level: Not on file  Occupational History   Not on file  Tobacco Use   Smoking status: Never   Smokeless  tobacco: Never  Vaping Use   Vaping Use: Never used  Substance and Sexual Activity   Alcohol use: No   Drug use: No   Sexual activity: Not on file  Other Topics Concern   Not on file  Social History Narrative   Not on file   Social Determinants of Health   Financial Resource Strain: Not on file  Food Insecurity: Not on file  Transportation Needs: Not on file  Physical Activity: Not on file  Stress: Not on file  Social Connections: Not on file  Intimate Partner Violence: Not on file    FAMILY HISTORY: Family History  Problem Relation Age of Onset   Breast cancer Mother 90   Breast cancer Maternal Aunt        d. under 3    ALLERGIES:  is allergic to penicillins and amlodipine.  MEDICATIONS:  Current Outpatient Medications  Medication Sig Dispense Refill   acetaminophen (TYLENOL) 500 MG tablet Take by mouth.     albuterol (VENTOLIN HFA) 108 (90 Base) MCG/ACT inhaler Inhale into the lungs.     APIXABAN (ELIQUIS) VTE STARTER PACK ('10MG'$  AND '5MG'$ ) Take as directed on package: start with two-'5mg'$  tablets twice daily for 7 days. On day 8, switch to one-'5mg'$  tablet twice daily. 1 each 0   azelastine (ASTELIN) 0.1 % nasal spray Place into both nostrils.     DULoxetine (CYMBALTA) 30 MG capsule Take by mouth.     hydrochlorothiazide (HYDRODIURIL) 12.5 MG tablet Take 1 tablet by mouth daily.     latanoprost (XALATAN) 0.005 % ophthalmic solution SMARTSIG:In Eye(s)     lidocaine-prilocaine (EMLA) cream Apply to affected area once 30 g 3   losartan (COZAAR) 50 MG tablet Take 50 mg by mouth daily.     meloxicam (MOBIC) 15 MG tablet Take 1 tablet (15 mg total) by mouth daily. 30 tablet 1   Multiple Vitamin (MULTIVITAMIN WITH MINERALS) TABS tablet Take 1 tablet by mouth daily.     naproxen (NAPROSYN) 500 MG tablet Take 1 tablet (500 mg total) by mouth 2 (two) times daily with a meal. 20 tablet 2   ondansetron (ZOFRAN) 8 MG tablet Take 1 tablet (8 mg total) by mouth 2 (two) times daily as  needed for refractory nausea / vomiting. Start on day 3 after carboplatin chemo. 30 tablet 1   traMADol (ULTRAM) 50 MG tablet Take 50 mg by mouth every 6 (six) hours as needed.     traMADol (ULTRAM) 50 MG tablet Take by mouth.     chlorpheniramine-HYDROcodone (TUSSIONEX) 10-8 MG/5ML SUER Take by mouth. (Patient not taking: Reported on 04/19/2021)     clonazePAM (KLONOPIN) 0.5 MG tablet Take 1 tablet (0.5 mg total) by mouth daily as needed for anxiety. 30 tablet 0   losartan (COZAAR) 100 MG tablet Take 1 tablet by mouth daily. (Patient not taking: Reported on 04/19/2021)     ondansetron (ZOFRAN-ODT) 4 MG disintegrating tablet Take 4 mg by mouth every 8 (eight) hours as needed. (Patient not taking: No sig reported)     potassium chloride SA (KLOR-CON) 20 MEQ tablet Take 1 tablet (20 mEq total) by mouth daily. 30 tablet 0  prochlorperazine (COMPAZINE) 10 MG tablet Take 1 tablet (10 mg total) by mouth every 6 (six) hours as needed (Nausea or vomiting). (Patient not taking: No sig reported) 30 tablet 1   timolol (TIMOPTIC) 0.5 % ophthalmic solution Place 1 drop into both eyes at bedtime.  (Patient not taking: Reported on 04/19/2021)     triamcinolone cream (KENALOG) 0.1 % SMARTSIG:1 Application Topical 2-3 Times Daily (Patient not taking: Reported on 04/19/2021)     No current facility-administered medications for this visit.     PHYSICAL EXAMINATION: ECOG PERFORMANCE STATUS: 1 - Symptomatic but completely ambulatory Vitals:   04/27/21 0834  BP: 125/83  Pulse: 92  Resp: 18  Temp: 99.6 F (37.6 C)  SpO2: 100%   Filed Weights   04/27/21 0834  Weight: 185 lb (83.9 kg)    Physical Exam Constitutional:      General: She is not in acute distress.    Appearance: She is obese.  HENT:     Head: Normocephalic and atraumatic.  Eyes:     General: No scleral icterus. Cardiovascular:     Rate and Rhythm: Normal rate and regular rhythm.     Heart sounds: Normal heart sounds.  Pulmonary:     Effort:  Pulmonary effort is normal. No respiratory distress.     Breath sounds: Normal breath sounds. No wheezing.  Abdominal:     General: Bowel sounds are normal. There is distension.     Palpations: Abdomen is soft.  Musculoskeletal:        General: No deformity. Normal range of motion.     Cervical back: Normal range of motion and neck supple.  Skin:    General: Skin is warm and dry.     Findings: No erythema or rash.  Neurological:     Mental Status: She is alert and oriented to person, place, and time. Mental status is at baseline.     Cranial Nerves: No cranial nerve deficit.     Coordination: Coordination normal.  Psychiatric:        Mood and Affect: Mood normal.    LABORATORY DATA:  I have reviewed the data as listed Lab Results  Component Value Date   WBC 4.6 04/27/2021   HGB 11.3 (L) 04/27/2021   HCT 34.6 (L) 04/27/2021   MCV 88.7 04/27/2021   PLT 335 04/27/2021   Recent Labs    04/11/21 1019 04/12/21 1630 04/19/21 0820 04/27/21 0809  NA 135 139 135 136  K 2.8* 3.3* 4.2 3.2*  CL 98 98 100 101  CO2 '30 31 28 27  '$ GLUCOSE 113* 101* 112* 136*  BUN '7 8 8 10  '$ CREATININE 0.92 0.89 1.13* 0.86  CALCIUM 9.1 9.4 9.7 9.1  GFRNONAA >60 >60 55* >60  PROT 6.2*  --  6.4* 6.0*  ALBUMIN 2.7*  --  2.8* 2.9*  AST 26  --  31 41  ALT 12  --  15 21  ALKPHOS 73  --  75 74  BILITOT 0.4  --  0.4 0.3    Iron/TIBC/Ferritin/ %Sat No results found for: IRON, TIBC, FERRITIN, IRONPCTSAT    RADIOGRAPHIC STUDIES: I have personally reviewed the radiological images as listed and agreed with the findings in the report. DG Chest 2 View  Result Date: 04/11/2021 CLINICAL DATA:  Shortness of breath. Ovarian mass. Malignant ascites. EXAM: CHEST - 2 VIEW COMPARISON:  One-view chest x-ray 03/06/2015. CT of the abdomen pelvis 03/23/2021 FINDINGS: Heart size is normal. Minimal airspace opacities are present both  lung bases, likely reflecting atelectasis. No nodule or mass lesion is present. No  significant effusions are present. Surgical clips are present at the gallbladder fossa. IMPRESSION: Minimal bibasilar airspace disease likely reflects atelectasis. No other significant airspace disease or effusion. Electronically Signed   By: San Morelle M.D.   On: 04/11/2021 12:04   CT Angio Chest Pulmonary Embolism (PE) W or WO Contrast  Result Date: 04/12/2021 CLINICAL DATA:  History of ovarian cancer. Now with progressive shortness of breath EXAM: CT ANGIOGRAPHY CHEST WITH CONTRAST TECHNIQUE: Multidetector CT imaging of the chest was performed using the standard protocol during bolus administration of intravenous contrast. Multiplanar CT image reconstructions and MIPs were obtained to evaluate the vascular anatomy. CONTRAST:  25m OMNIPAQUE IOHEXOL 350 MG/ML SOLN COMPARISON:  None. FINDINGS: Cardiovascular: Examination positive for multiple segmental and lobar pulmonary emboli. Examination is positive for acute emboli within the right middle lobe, right upper lobe, and lobar lingular pulmonary arteries. There also segmental and subsegmental filling defects within the right lower lobe, left upper lobe and right upper lobe. Mild cardiac enlargement.  No pericardial effusion. Mediastinum/Nodes: No enlarged mediastinal, hilar, or axillary lymph nodes. Thyroid gland, trachea, and esophagus demonstrate no significant findings. Lungs/Pleura: No pleural effusion. No airspace consolidation or pneumothorax. Scar versus platelike atelectasis identified within the anterior basal right upper lobe. Upper Abdomen: Upper abdominal ascites is again noted. No acute findings. Musculoskeletal: No chest wall abnormality. No acute or significant osseous findings. Review of the MIP images confirms the above findings. IMPRESSION: 1. Examination is positive for multiple bilateral lobar, segmental and subsegmental pulmonary emboli. 2. Upper abdominal ascites. Critical Value/emergent results were called by telephone at the  time of interpretation on 04/12/2021 at 4:10 pm [facility] who verbally acknowledged these results. Electronically Signed   By: TKerby MoorsM.D.   On: 04/12/2021 16:11   [place]Venous Img Lower Bilateral (DVT)  Result Date: 04/11/2021 CLINICAL DATA:  Lumen ascites Bilateral lower extremity swelling for 1 month EXAM: BILATERAL LOWER EXTREMITY VENOUS DOPPLER ULTRASOUND TECHNIQUE: Gray-scale sonography with compression, as well as color and duplex ultrasound, were performed to evaluate the deep venous system(s) from the level of the common femoral vein through the popliteal and proximal calf veins. COMPARISON:  None. FINDINGS: VENOUS Normal compressibility of the common femoral, superficial femoral, and popliteal veins, as well as the visualized calf veins. Visualized portions of profunda femoral vein and great saphenous vein unremarkable. No filling defects to suggest DVT on grayscale or color Doppler imaging. Doppler waveforms show normal direction of venous flow, normal respiratory plasticity and response to augmentation. OTHER None. Limitations: none IMPRESSION: No lower extremity DVT Electronically Signed   By: FMiachel RouxM.D.   On: 04/11/2021 11:34   [place]acentesis  Result Date: 04/20/2021 INDICATION: Patient with recently diagnosed ovarian cancer, recurrent malignant ascites. Request to IR for diagnostic and therapeutic paracentesis with 4 L maximum. EXAM: ULTRASOUND GUIDED DIAGNOSTIC AND THERAPEUTIC PARACENTESIS MEDICATIONS: 10 mL 1% lidocaine COMPLICATIONS: None immediate. PROCEDURE: Informed written consent was obtained from the patient after a discussion of the risks, benefits and alternatives to treatment. A timeout was performed prior to the initiation of the procedure. Initial ultrasound scanning demonstrates a large amount of ascites within the right lower abdominal quadrant. The right lower abdomen was prepped and draped in the usual sterile fashion. 1% lidocaine was used for local  anesthesia. Following this, a 6 Fr Safe-T-Centesis catheter was introduced. An ultrasound image was saved for documentation purposes. The paracentesis  was performed. The catheter was removed and a dressing was applied. The patient tolerated the procedure well without immediate post procedural complication. FINDINGS: A total of approximately 3.6 L of clear yellow fluid was removed. IMPRESSION: Successful ultrasound-guided paracentesis yielding 3.6 liters of peritoneal fluid. Read by Candiss Norse, PA-C Electronically Signed   By: Jerilynn Mages.  Shick M.D.   On: 04/20/2021 15:37   US Paracentesis  Result Date: 04/09/2021 INDICATION: Patient with recently noted right-sided ovarian mass and new onset ascites. Request to IR for diagnostic and therapeutic paracentesis. EXAM: ULTRASOUND GUIDED DIAGNOSTIC THERAPEUTIC PARACENTESIS MEDICATIONS: 8 mL% lidocaine COMPLICATIONS: None immediate. PROCEDURE: Informed written consent was obtained from the patient after a discussion of the risks, benefits and alternatives to treatment. A timeout was performed prior to the initiation of the procedure. Initial ultrasound scanning demonstrates a large amount of ascites within the right lower abdominal quadrant. The right lower abdomen was prepped and draped in the usual sterile fashion. 1% lidocaine was used for local anesthesia. Following this, a 19 gauge, 10-cm, Yueh catheter was introduced. An ultrasound image was saved for documentation purposes. The paracentesis was performed. The catheter was removed and a dressing was applied. The patient tolerated the procedure well without immediate post procedural complication. FINDINGS: A total of approximately 6.3 L of hazy yellow fluid was removed. Samples were sent to the laboratory as requested by the clinical team. IMPRESSION: Successful ultrasound-guided paracentesis yielding 6.3 liters of peritoneal fluid. Read by Candiss Norse, PA-C Electronically Signed   By: Markus Daft M.D.   On:  04/09/2021 16:35      ASSESSMENT & PLAN:  1. 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 Malignant ascites   3. Other acute pulmonary embolism without acute cor pulmonale ([place])   4. Anxiety   5. Other cirrhosis of liver ([place])    #Large ovarian cystic mass with malignant ascites, peritoneal nodularity Elevated CA125 Case was discussed on Gynonc tumor board on 04/25/2021. Consensus reached upon clinical diagnosis of locally advanced Stage II/III Ovarian Cancer, neoadjuvant chemotherapy followed by debulking surgery.  S/p cycle 1 Carboplatin AUC 5 and Taxol '135mg'$ /m2 Overall she tolerates chemotherapy.  Genetic counselor today Check CBC in 1 week.   # Constipation, recommend her to continue stool softener.   # Anxiety, she is on Cymbalta '30mg'$  daily. She takes  Clonazepam 0.'5mg'$  QHS PRN and requests refill.   #Bilateral pulmonary embolism, continue Eliquis '5mg'$  BID Postpone our plan of medi port placement due to new PE. .  # Malignant ascites, paracentesis PRN  # #Severe hypokalemia, recommend potassium 19mq daily   #Radiographic evidence of liver cirrhosis This is a new diagnosis for her. Normal PT, PTT, LFT. Negative hepatitis Refer to gastroenterology.    Orders Placed This Encounter  Procedures   CBC with Differential/Platelet    Standing Status:   Future    Standing Expiration Date:   04/27/2022     All questions were answered. The patient knows to call the clinic with any problems questions or concerns.  cc KAdin Hector MD    Return of visit: 2 weeks for cycle 2 carboplatin and taxol.    ZEarlie Server MD, PhD Hematology Oncology CEdinburghat [facility] [address]8/08/2021

## 2021-04-30 ENCOUNTER — Other Ambulatory Visit: Payer: Self-pay

## 2021-04-30 DIAGNOSIS — C561 Malignant neoplasm of right ovary: Secondary | ICD-10-CM

## 2021-05-04 ENCOUNTER — Inpatient Hospital Stay: Payer: BC Managed Care – PPO

## 2021-05-04 DIAGNOSIS — C561 Malignant neoplasm of right ovary: Secondary | ICD-10-CM

## 2021-05-04 LAB — CBC WITH DIFFERENTIAL/PLATELET
Abs Immature Granulocytes: 0.01 10*3/uL (ref 0.00–0.07)
Basophils Absolute: 0 10*3/uL (ref 0.0–0.1)
Basophils Relative: 1 %
Eosinophils Absolute: 0 10*3/uL (ref 0.0–0.5)
Eosinophils Relative: 1 %
HCT: 34.4 % — ABNORMAL LOW (ref 36.0–46.0)
Hemoglobin: 11.2 g/dL — ABNORMAL LOW (ref 12.0–15.0)
Immature Granulocytes: 0 %
Lymphocytes Relative: 33 %
Lymphs Abs: 1.1 10*3/uL (ref 0.7–4.0)
MCH: 29.2 pg (ref 26.0–34.0)
MCHC: 32.6 g/dL (ref 30.0–36.0)
MCV: 89.8 fL (ref 80.0–100.0)
Monocytes Absolute: 0.5 10*3/uL (ref 0.1–1.0)
Monocytes Relative: 15 %
Neutro Abs: 1.7 10*3/uL (ref 1.7–7.7)
Neutrophils Relative %: 50 %
Platelets: 162 10*3/uL (ref 150–400)
RBC: 3.83 MIL/uL — ABNORMAL LOW (ref 3.87–5.11)
RDW: 15.4 % (ref 11.5–15.5)
WBC: 3.4 10*3/uL — ABNORMAL LOW (ref 4.0–10.5)
nRBC: 0 % (ref 0.0–0.2)

## 2021-05-09 ENCOUNTER — Encounter: Payer: Self-pay | Admitting: Obstetrics and Gynecology

## 2021-05-09 NOTE — Progress Notes (Unsigned)
I reviewed CA125 and CEA. CA125/CEA ratio ranges from 15.5-17.7. Ratio of less than 25 concerning for non-ovarian malignancy. Colonoscopy procedure report not seen in Epic. Her office notes she had colonoscopies in 2012 or 2015. Recommend repeat colonoscopy prior to debulking surgery. I will ask the team to contact her with these results and arrange for colonoscopy 3 weeks after cycle 3 and prior to follow up with [place].  Brittany Lavigne Gaetana Michaelis, MD

## 2021-05-10 ENCOUNTER — Other Ambulatory Visit: Payer: Self-pay

## 2021-05-10 ENCOUNTER — Inpatient Hospital Stay: Payer: BC Managed Care – PPO

## 2021-05-10 ENCOUNTER — Encounter: Payer: Self-pay | Admitting: Oncology

## 2021-05-10 ENCOUNTER — Inpatient Hospital Stay (HOSPITAL_BASED_OUTPATIENT_CLINIC_OR_DEPARTMENT_OTHER): Payer: BC Managed Care – PPO | Admitting: Oncology

## 2021-05-10 VITALS — BP 133/76 | HR 88 | Temp 98.4°F | Resp 18 | Wt 188.3 lb

## 2021-05-10 VITALS — BP 110/64 | HR 80 | Resp 18

## 2021-05-10 DIAGNOSIS — K7469 Other cirrhosis of liver: Secondary | ICD-10-CM | POA: Diagnosis not present

## 2021-05-10 DIAGNOSIS — C569 Malignant neoplasm of unspecified ovary: Secondary | ICD-10-CM

## 2021-05-10 DIAGNOSIS — R180 Malignant ascites: Secondary | ICD-10-CM | POA: Diagnosis not present

## 2021-05-10 DIAGNOSIS — Z5111 Encounter for antineoplastic chemotherapy: Secondary | ICD-10-CM

## 2021-05-10 DIAGNOSIS — I2699 Other pulmonary embolism without acute cor pulmonale: Secondary | ICD-10-CM

## 2021-05-10 DIAGNOSIS — T8090XA Unspecified complication following infusion and therapeutic injection, initial encounter: Secondary | ICD-10-CM

## 2021-05-10 DIAGNOSIS — C561 Malignant neoplasm of right ovary: Secondary | ICD-10-CM | POA: Diagnosis not present

## 2021-05-10 DIAGNOSIS — Z803 Family history of malignant neoplasm of breast: Secondary | ICD-10-CM

## 2021-05-10 LAB — CBC WITH DIFFERENTIAL/PLATELET
Abs Immature Granulocytes: 0.02 10*3/uL (ref 0.00–0.07)
Basophils Absolute: 0 10*3/uL (ref 0.0–0.1)
Basophils Relative: 1 %
Eosinophils Absolute: 0.1 10*3/uL (ref 0.0–0.5)
Eosinophils Relative: 1 %
HCT: 33.2 % — ABNORMAL LOW (ref 36.0–46.0)
Hemoglobin: 11.2 g/dL — ABNORMAL LOW (ref 12.0–15.0)
Immature Granulocytes: 0 %
Lymphocytes Relative: 14 %
Lymphs Abs: 1 10*3/uL (ref 0.7–4.0)
MCH: 29.8 pg (ref 26.0–34.0)
MCHC: 33.7 g/dL (ref 30.0–36.0)
MCV: 88.3 fL (ref 80.0–100.0)
Monocytes Absolute: 0.4 10*3/uL (ref 0.1–1.0)
Monocytes Relative: 6 %
Neutro Abs: 5.7 10*3/uL (ref 1.7–7.7)
Neutrophils Relative %: 78 %
Platelets: 184 10*3/uL (ref 150–400)
RBC: 3.76 MIL/uL — ABNORMAL LOW (ref 3.87–5.11)
RDW: 16.3 % — ABNORMAL HIGH (ref 11.5–15.5)
WBC: 7.3 10*3/uL (ref 4.0–10.5)
nRBC: 0 % (ref 0.0–0.2)

## 2021-05-10 LAB — COMPREHENSIVE METABOLIC PANEL
ALT: 26 U/L (ref 0–44)
AST: 42 U/L — ABNORMAL HIGH (ref 15–41)
Albumin: 3.3 g/dL — ABNORMAL LOW (ref 3.5–5.0)
Alkaline Phosphatase: 105 U/L (ref 38–126)
Anion gap: 7 (ref 5–15)
BUN: 9 mg/dL (ref 8–23)
CO2: 27 mmol/L (ref 22–32)
Calcium: 9.5 mg/dL (ref 8.9–10.3)
Chloride: 103 mmol/L (ref 98–111)
Creatinine, Ser: 0.87 mg/dL (ref 0.44–1.00)
GFR, Estimated: >60 mL/min (ref >60)
Glucose, Bld: 110 mg/dL — ABNORMAL HIGH (ref 70–99)
Potassium: 3.5 mmol/L (ref 3.5–5.1)
Sodium: 137 mmol/L (ref 135–145)
Total Bilirubin: 0.5 mg/dL (ref 0.3–1.2)
Total Protein: 6.8 g/dL (ref 6.5–8.1)

## 2021-05-10 MED ORDER — SODIUM CHLORIDE 0.9 % IV SOLN
Freq: Once | INTRAVENOUS | Status: AC
Start: 2021-05-10 — End: 2021-05-10
  Filled 2021-05-10: qty 250

## 2021-05-10 MED ORDER — LORAZEPAM 2 MG/ML IJ SOLN: 0.5000 mg | Freq: Once | INTRAMUSCULAR | Status: DC | PRN

## 2021-05-10 MED ORDER — DIPHENHYDRAMINE HCL 50 MG/ML IJ SOLN
50.0000 mg | Freq: Once | INTRAMUSCULAR | Status: AC
  Administered 2021-05-10: 50 mg via INTRAVENOUS
  Filled 2021-05-10: qty 1

## 2021-05-10 MED ORDER — FAMOTIDINE 20 MG IN NS 100 ML IVPB
20.0000 mg | Freq: Once | INTRAVENOUS | Status: AC | PRN
  Administered 2021-05-10: 20 mg via INTRAVENOUS
  Filled 2021-05-10: qty 100

## 2021-05-10 MED ORDER — METHYLPREDNISOLONE SODIUM SUCC 125 MG IJ SOLR
125.0000 mg | Freq: Once | INTRAMUSCULAR | Status: AC | PRN
  Administered 2021-05-10: 125 mg via INTRAVENOUS

## 2021-05-10 MED ORDER — DIPHENHYDRAMINE HCL 50 MG/ML IJ SOLN
50.0000 mg | Freq: Once | INTRAMUSCULAR | Status: AC | PRN
  Administered 2021-05-10: 50 mg via INTRAVENOUS

## 2021-05-10 MED ORDER — CARBOPLATIN CHEMO INJECTION 600 MG/60ML
700.0000 mg | Freq: Once | INTRAVENOUS | Status: AC
  Administered 2021-05-10: 700 mg via INTRAVENOUS
  Filled 2021-05-10: qty 70

## 2021-05-10 MED ORDER — APIXABAN 5 MG PO TABS: 5.0000 mg | ORAL_TABLET | Freq: Two times a day (BID) | ORAL | 2 refills | Status: DC

## 2021-05-10 MED ORDER — PALONOSETRON HCL INJECTION 0.25 MG/5ML
0.2500 mg | Freq: Once | INTRAVENOUS | Status: AC
  Administered 2021-05-10: 0.25 mg via INTRAVENOUS
  Filled 2021-05-10: qty 5

## 2021-05-10 MED ORDER — SODIUM CHLORIDE 0.9 % IV SOLN
150.0000 mg/m2 | Freq: Once | INTRAVENOUS | Status: AC
  Administered 2021-05-10: 294 mg via INTRAVENOUS
  Filled 2021-05-10: qty 49

## 2021-05-10 MED ORDER — SODIUM CHLORIDE 0.9 % IV SOLN
10.0000 mg | Freq: Once | INTRAVENOUS | Status: AC
  Administered 2021-05-10: 10 mg via INTRAVENOUS
  Filled 2021-05-10: qty 1

## 2021-05-10 MED ORDER — DEXAMETHASONE 4 MG PO TABS: 8.0000 mg | ORAL_TABLET | ORAL | 0 refills | Status: DC

## 2021-05-10 MED ORDER — FAMOTIDINE 20 MG IN NS 100 ML IVPB
20.0000 mg | Freq: Once | INTRAVENOUS | Status: AC
  Administered 2021-05-10: 20 mg via INTRAVENOUS
  Filled 2021-05-10: qty 100

## 2021-05-10 NOTE — Progress Notes (Signed)
Pt states she is feeling constantly fatigued throughout the day.

## 2021-05-10 NOTE — Addendum Note (Signed)
Addended by: Earlie Server on: 05/10/2021 04:49 PM   Modules accepted: Orders, Level of Service

## 2021-05-10 NOTE — Patient Instructions (Signed)
[facility]  Discharge Instructions: Thank you for choosing [facility] to provide your oncology and hematology care.  If you have a lab appointment with the Cancer Center, please go directly to the Cancer Center and check in at the registration area.  Wear comfortable clothing and clothing appropriate for easy access to any Portacath or PICC line.   We strive to give you quality time with your provider. You may need to reschedule your appointment if you arrive late (15 or more minutes).  Arriving late affects you and other patients whose appointments are after yours.  Also, if you miss three or more appointments without notifying the office, you may be dismissed from the clinic at the provider's discretion.      For prescription refill requests, have your pharmacy contact our office and allow 72 hours for refills to be completed.    Today you received the following chemotherapy and/or immunotherapy agents       To help prevent nausea and vomiting after your treatment, we encourage you to take your nausea medication as directed.  BELOW ARE SYMPTOMS THAT SHOULD BE REPORTED IMMEDIATELY: *FEVER GREATER THAN 100.4 F (38 C) OR HIGHER *CHILLS OR SWEATING *NAUSEA AND VOMITING THAT IS NOT CONTROLLED WITH YOUR NAUSEA MEDICATION *UNUSUAL SHORTNESS OF BREATH *UNUSUAL BRUISING OR BLEEDING *URINARY PROBLEMS (pain or burning when urinating, or frequent urination) *BOWEL PROBLEMS (unusual diarrhea, constipation, pain near the anus) TENDERNESS IN MOUTH AND THROAT WITH OR WITHOUT PRESENCE OF ULCERS (sore throat, sores in mouth, or a toothache) UNUSUAL RASH, SWELLING OR PAIN  UNUSUAL VAGINAL DISCHARGE OR ITCHING   Items with * indicate a potential emergency and should be followed up as soon as possible or go to the Emergency Department if any problems should occur.  Please show the CHEMOTHERAPY ALERT CARD or IMMUNOTHERAPY ALERT CARD at check-in to the Emergency  Department and triage nurse.  Should you have questions after your visit or need to cancel or reschedule your appointment, please contact [facility] [place]  [phone] and follow the prompts.  Office hours are 8:00 a.m. to 4:30 p.m. Monday - Friday. Please note that voicemails left after 4:00 p.m. may not be returned until the following business day.  We are closed weekends and major holidays. You have access to a nurse at all times for urgent questions. Please call the main number to the clinic [phone] and follow the prompts.  For any non-urgent questions, you may also contact your provider using MyChart. We now offer e-Visits for anyone 18 and older to request care online for non-urgent symptoms. For details visit [email or web address].   Also download the MyChart app! Go to the app store, search "MyChart", open the app, select , and log in with your MyChart username and password.  Due to Covid, a mask is required upon entering the hospital/clinic. If you do not have a mask, one will be given to you upon arrival. For doctor visits, patients may have 1 support person aged 18 or older with them. For treatment visits, patients cannot have anyone with them due to current Covid guidelines and our immunocompromised population.  

## 2021-05-10 NOTE — Progress Notes (Addendum)
Hematology/Oncology progress note Brittany Montoya Telephone:(336) [phone] Fax:(336) [phone]   Patient Care Team: Adin Hector, MD as PCP - General (Internal Medicine) Earlie Server, MD as Consulting Physician (Hematology and Oncology) Clent Jacks, RN as Oncology Nurse Navigator  REFERRING PROVIDER: Adin Hector, MD  CHIEF COMPLAINTS/REASON FOR VISIT:  Malignant ascites, ovarian mass, pulmonary embolism  HISTORY OF PRESENTING ILLNESS:   Brittany Montoya is a  60 y.o.  female with PMH listed below was seen in consultation at the request of  Adin Hector, MD  for evaluation of complicated ovarian cyst, ascites  Patient has noticed generalized abdominal distention and bloating, nausea and constipation for the past months and  03/23/2021, CT abdomen pelvis with contrast showed large solid and cystic right ovarian mass measuring 14.1 x 18.1 x 16 cm highly suspicious for primary ovarian malignancy.  Evidence of peritoneal spread.  Moderate associated ascites. Slightly small liver with mild nodular contour suggesting a degree of cirrhosis.  04/03/2021, CA125 70.9, Hg 4 400 09.  04/04/2021, was seen by gynecology Dr. Ouida Sills.  And was referred to establish care with GYN oncology. 04/09/2021, patient underwent paracentesis and had 6.3 L of hazy yellow fluid removed.  Cytology is pending.  04/12/2021 patient was referred to see Dr. Theora Gianotti and me.  Patient also has noticed bilateral lower extremity edema, progressively worsening.  Fatigued, shortness of breath with exertion.  Denies any chest pain, cough.  Poor oral intake due to decreased appetite She was accompanied by her husband. Denies any alcohol use or previous hepatitis infection.  She is not aware about cirrhosis. She reports some symptom relief after the paracentesis.  Her abdomen seems to got better and getting worse again.    INTERVAL HISTORY Brittany Montoya is a 60 y.o. female who  has above history reviewed by me today presents for follow up visit for malignant ascites, ovarian mass, pulmonary embolism.  Problems and complaints are listed below: Accompanied by daughter.  On Eliquis '5mg'$  BID, no bleeding event.  She feels somewhat better comparing to prior to the treatments.  Not back to her baseline yet. Abdominal bloating and distention was better after paracentesis and symptom gradually worsened.   She has had lower abdominal pressure radiating to her groin area which is better now, used to be constant discomfort now intermittent.  Not completely resolved. . Shortness of breath has improved.  Review of Systems  Constitutional:  Positive for appetite change and fatigue. Negative for chills and fever.  HENT:   Negative for hearing loss and voice change.   Eyes:  Negative for eye problems.  Respiratory:  Positive for shortness of breath. Negative for chest tightness and cough.   Cardiovascular:  Negative for chest pain.  Gastrointestinal:  Positive for abdominal distention and constipation. Negative for abdominal pain and blood in stool.  Endocrine: Negative for hot flashes.  Genitourinary:  Negative for difficulty urinating and frequency.   Musculoskeletal:  Negative for arthralgias.  Skin:  Negative for itching and rash.  Neurological:  Negative for extremity weakness.  Hematological:  Negative for adenopathy.  Psychiatric/Behavioral:  Negative for confusion. The patient is nervous/anxious.    MEDICAL HISTORY:  Past Medical History:  Diagnosis Date   Arthritis    Family history of breast cancer    Glaucoma    Hypertension    Ovarian cancer ([place]) 04/15/2021   Ovarian cancer ([place]) 04/15/2021    SURGICAL HISTORY: Past Surgical History:  Procedure Laterality Date  ABDOMINAL HYSTERECTOMY N/A    may have had left ovary removed   CESAREAN SECTION N/A    C-section x 2    SOCIAL HISTORY: Social History   Socioeconomic History   Marital status: Married     Spouse name: Not on file   Number of children: Not on file   Years of education: Not on file   Highest education level: Not on file  Occupational History   Not on file  Tobacco Use   Smoking status: Never   Smokeless tobacco: Never  Vaping Use   Vaping Use: Never used  Substance and Sexual Activity   Alcohol use: No   Drug use: No   Sexual activity: Not on file  Other Topics Concern   Not on file  Social History Narrative   Not on file   Social Determinants of Health   Financial Resource Strain: Not on file  Food Insecurity: Not on file  Transportation Needs: Not on file  Physical Activity: Not on file  Stress: Not on file  Social Connections: Not on file  Intimate Partner Violence: Not on file    FAMILY HISTORY: Family History  Problem Relation Age of Onset   Breast cancer Mother 27   Breast cancer Maternal Aunt        d. under 63    ALLERGIES:  is allergic to penicillins and amlodipine.  MEDICATIONS:  Current Outpatient Medications  Medication Sig Dispense Refill   acetaminophen (TYLENOL) 500 MG tablet Take by mouth.     albuterol (VENTOLIN HFA) 108 (90 Base) MCG/ACT inhaler Inhale into the lungs.     APIXABAN (ELIQUIS) VTE STARTER PACK ('10MG'$  AND '5MG'$ ) Take as directed on package: start with two-'5mg'$  tablets twice daily for 7 days. On day 8, switch to one-'5mg'$  tablet twice daily. 1 each 0   azelastine (ASTELIN) 0.1 % nasal spray Place into both nostrils.     chlorpheniramine-HYDROcodone (TUSSIONEX) 10-8 MG/5ML SUER Take by mouth. (Patient not taking: Reported on 04/19/2021)     clonazePAM (KLONOPIN) 0.5 MG tablet Take 1 tablet (0.5 mg total) by mouth daily as needed for anxiety. 30 tablet 0   DULoxetine (CYMBALTA) 30 MG capsule Take by mouth.     hydrochlorothiazide (HYDRODIURIL) 12.5 MG tablet Take 1 tablet by mouth daily.     latanoprost (XALATAN) 0.005 % ophthalmic solution SMARTSIG:In Eye(s)     lidocaine-prilocaine (EMLA) cream Apply to affected area once 30  g 3   losartan (COZAAR) 100 MG tablet Take 1 tablet by mouth daily. (Patient not taking: Reported on 04/19/2021)     losartan (COZAAR) 50 MG tablet Take 50 mg by mouth daily.     meloxicam (MOBIC) 15 MG tablet Take 1 tablet (15 mg total) by mouth daily. 30 tablet 1   Multiple Vitamin (MULTIVITAMIN WITH MINERALS) TABS tablet Take 1 tablet by mouth daily.     naproxen (NAPROSYN) 500 MG tablet Take 1 tablet (500 mg total) by mouth 2 (two) times daily with a meal. 20 tablet 2   ondansetron (ZOFRAN) 8 MG tablet Take 1 tablet (8 mg total) by mouth 2 (two) times daily as needed for refractory nausea / vomiting. Start on day 3 after carboplatin chemo. 30 tablet 1   ondansetron (ZOFRAN-ODT) 4 MG disintegrating tablet Take 4 mg by mouth every 8 (eight) hours as needed. (Patient not taking: No sig reported)     potassium chloride SA (KLOR-CON) 20 MEQ tablet Take 1 tablet (20 mEq total) by mouth daily. [place]  tablet 0   prochlorperazine (COMPAZINE) 10 MG tablet Take 1 tablet (10 mg total) by mouth every 6 (six) hours as needed (Nausea or vomiting). (Patient not taking: No sig reported) 30 tablet 1   timolol (TIMOPTIC) 0.5 % ophthalmic solution Place 1 drop into both eyes at bedtime.  (Patient not taking: Reported on 04/19/2021)     traMADol (ULTRAM) 50 MG tablet Take 50 mg by mouth every 6 (six) hours as needed.     traMADol (ULTRAM) 50 MG tablet Take by mouth.     triamcinolone cream (KENALOG) 0.1 % SMARTSIG:1 Application Topical 2-3 Times Daily (Patient not taking: Reported on 04/19/2021)     No current facility-administered medications for this visit.     PHYSICAL EXAMINATION: ECOG PERFORMANCE STATUS: 1 - Symptomatic but completely ambulatory Vitals:   05/10/21 0826  BP: 133/76  Pulse: 88  Resp: 18  Temp: 98.4 F (36.9 C)  SpO2: 100%   There were no vitals filed for this visit.   Physical Exam Constitutional:      General: She is not in acute distress.    Appearance: She is obese.  HENT:     Head:  Normocephalic and atraumatic.  Eyes:     General: No scleral icterus. Cardiovascular:     Rate and Rhythm: Normal rate and regular rhythm.     Heart sounds: Normal heart sounds.  Pulmonary:     Effort: Pulmonary effort is normal. No respiratory distress.     Breath sounds: Normal breath sounds. No wheezing.  Abdominal:     General: Bowel sounds are normal. There is distension.     Palpations: Abdomen is soft.  Musculoskeletal:        General: No deformity. Normal range of motion.     Cervical back: Normal range of motion and neck supple.  Skin:    General: Skin is warm and dry.     Findings: No erythema or rash.  Neurological:     Mental Status: She is alert and oriented to person, place, and time. Mental status is at baseline.     Cranial Nerves: No cranial nerve deficit.     Coordination: Coordination normal.  Psychiatric:        Mood and Affect: Mood normal.    LABORATORY DATA:  I have reviewed the data as listed Lab Results  Component Value Date   WBC 3.4 (L) 05/04/2021   HGB 11.2 (L) 05/04/2021   HCT 34.4 (L) 05/04/2021   MCV 89.8 05/04/2021   PLT 162 05/04/2021   Recent Labs    04/11/21 1019 04/12/21 1630 04/19/21 0820 04/27/21 0809  NA 135 139 135 136  K 2.8* 3.3* 4.2 3.2*  CL 98 98 100 101  CO2 '30 31 28 27  '$ GLUCOSE 113* 101* 112* 136*  BUN '7 8 8 10  '$ CREATININE 0.92 0.89 1.13* 0.86  CALCIUM 9.1 9.4 9.7 9.1  GFRNONAA >60 >60 55* >60  PROT 6.2*  --  6.4* 6.0*  ALBUMIN 2.7*  --  2.8* 2.9*  AST 26  --  31 41  ALT 12  --  15 21  ALKPHOS 73  --  75 74  BILITOT 0.4  --  0.4 0.3    Iron/TIBC/Ferritin/ %Sat No results found for: IRON, TIBC, FERRITIN, IRONPCTSAT    RADIOGRAPHIC STUDIES: I have personally reviewed the radiological images as listed and agreed with the findings in the report. DG Chest 2 View  Result Date: 04/11/2021 CLINICAL DATA:  Shortness of breath.  Ovarian mass. Malignant ascites. EXAM: CHEST - 2 VIEW COMPARISON:  One-view chest x-ray  03/06/2015. CT of the abdomen pelvis 03/23/2021 FINDINGS: Heart size is normal. Minimal airspace opacities are present both lung bases, likely reflecting atelectasis. No nodule or mass lesion is present. No significant effusions are present. Surgical clips are present at the gallbladder fossa. IMPRESSION: Minimal bibasilar airspace disease likely reflects atelectasis. No other significant airspace disease or effusion. Electronically Signed   By: San Morelle M.D.   On: 04/11/2021 12:04   CT Angio Chest Pulmonary Embolism (PE) W or WO Contrast  Result Date: 04/12/2021 CLINICAL DATA:  History of ovarian cancer. Now with progressive shortness of breath EXAM: CT ANGIOGRAPHY CHEST WITH CONTRAST TECHNIQUE: Multidetector CT imaging of the chest was performed using the standard protocol during bolus administration of intravenous contrast. Multiplanar CT image reconstructions and MIPs were obtained to evaluate the vascular anatomy. CONTRAST:  19m OMNIPAQUE IOHEXOL 350 MG/ML SOLN COMPARISON:  None. FINDINGS: Cardiovascular: Examination positive for multiple segmental and lobar pulmonary emboli. Examination is positive for acute emboli within the right middle lobe, right upper lobe, and lobar lingular pulmonary arteries. There also segmental and subsegmental filling defects within the right lower lobe, left upper lobe and right upper lobe. Mild cardiac enlargement.  No pericardial effusion. Mediastinum/Nodes: No enlarged mediastinal, hilar, or axillary lymph nodes. Thyroid gland, trachea, and esophagus demonstrate no significant findings. Lungs/Pleura: No pleural effusion. No airspace consolidation or pneumothorax. Scar versus platelike atelectasis identified within the anterior basal right upper lobe. Upper Abdomen: Upper abdominal ascites is again noted. No acute findings. Musculoskeletal: No chest wall abnormality. No acute or significant osseous findings. Review of the MIP images confirms the above findings.  IMPRESSION: 1. Examination is positive for multiple bilateral lobar, segmental and subsegmental pulmonary emboli. 2. Upper abdominal ascites. Critical Value/emergent results were called by telephone at the time of interpretation on 04/12/2021 at 4:10 pm to provider ZJeff Davis Montoya, who verbally acknowledged these results. Electronically Signed   By: TKerby MoorsM.D.   On: 04/12/2021 16:11   [place]Venous Img Lower Bilateral (DVT)  Result Date: 04/11/2021 CLINICAL DATA:  Lumen ascites Bilateral lower extremity swelling for 1 month EXAM: BILATERAL LOWER EXTREMITY VENOUS DOPPLER ULTRASOUND TECHNIQUE: Gray-scale sonography with compression, as well as color and duplex ultrasound, were performed to evaluate the deep venous system(s) from the level of the common femoral vein through the popliteal and proximal calf veins. COMPARISON:  None. FINDINGS: VENOUS Normal compressibility of the common femoral, superficial femoral, and popliteal veins, as well as the visualized calf veins. Visualized portions of profunda femoral vein and great saphenous vein unremarkable. No filling defects to suggest DVT on grayscale or color Doppler imaging. Doppler waveforms show normal direction of venous flow, normal respiratory plasticity and response to augmentation. OTHER None. Limitations: none IMPRESSION: No lower extremity DVT Electronically Signed   By: FMiachel RouxM.D.   On: 04/11/2021 11:34   [place]orea[place]acentesis  Result Date: 04/20/2021 INDICATION: Patient with recently diagnosed ovarian cancer, recurrent malignant ascites. Request to IR for diagnostic and therapeutic paracentesis with 4 L maximum. EXAM: ULTRASOUND GUIDED DIAGNOSTIC AND THERAPEUTIC PARACENTESIS MEDICATIONS: 10 mL 1% lidocaine COMPLICATIONS: None immediate. PROCEDURE: Informed written consent was obtained from the patient after a discussion of the risks, benefits and alternatives to treatment. A timeout was performed prior to the initiation of the procedure. Initial  ultrasound scanning demonstrates a large amount of ascites within the right lower abdominal quadrant. The right lower abdomen was prepped and  draped in the usual sterile fashion. 1% lidocaine was used for local anesthesia. Following this, a 6 Fr Safe-T-Centesis catheter was introduced. An ultrasound image was saved for documentation purposes. The paracentesis was performed. The catheter was removed and a dressing was applied. The patient tolerated the procedure well without immediate post procedural complication. FINDINGS: A total of approximately 3.6 L of clear yellow fluid was removed. IMPRESSION: Successful ultrasound-guided paracentesis yielding 3.6 liters of peritoneal fluid. Read by Candiss Norse, PA-C Electronically Signed   By: Jerilynn Mages.  Shick M.D.   On: 04/20/2021 15:37      ASSESSMENT & PLAN:  1. 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 Malignant ascites   3. Other acute pulmonary embolism without acute cor pulmonale (HCC)   4. Other cirrhosis of liver ([place])   5. Encounter for antineoplastic chemotherapy   6. Family history of breast cancer    #Large ovarian cystic mass with malignant ascites, peritoneal nodularity Elevated CA125 Case was discussed on Gynonc tumor board on 04/25/2021. Consensus reached upon clinical diagnosis of locally advanced Stage II/III Ovarian Cancer, neoadjuvant chemotherapy followed by debulking surgery.  S/p cycle 1 Carboplatin AUC 5 and Taxol '135mg'$ /m2 Overall she tolerates very well.  Labs are reviewed and discussed with patient.CBD has been followed weekly and has been stable. Proceed with cycle 2 carboplatin, increase to AUC of 6, Taxol 150 mg/m2. Weekly CBC. She will need a CT 2 weeks after cycle 3 treatments.   #Grade 2 infusion reaction. Patient developed infusion reaction after about 15 minutes into the Taxol infusion today. Symptoms includes shortness of breath, flushing.  Vital signs remains normal and O2 sats 96% on room air.  Taxol was stopped.   Patient was given Benadryl, Solu-Medrol and Pepcid.  Symptoms resolved.  Patient was able to finish carboplatin treatments today. Plan to switch to Abraxane in combination with carboplatin for cycle 3 treatments with premeds.  We will update gynecology oncology team.  She has establish care with genetic counselor.  Results are pending.  # Constipation, recommend her to continue stool softener.   # Anxiety, she is on Cymbalta '30mg'$  daily. She takes  Clonazepam 0.'5mg'$  QHS PRN and requests refill.   #Bilateral pulmonary embolism, continue Eliquis '5mg'$  BID Hold off Mediport insertion due to being on anticoagulation as patient has changed her mind and is coping with utilization of her currently for her treatments.  # Malignant ascites, paracentesis PRN -slightly more distended compared to last visit.  We will check ultrasound and paracentesis if there is drainable fluid.  #  hypokalemia, continue potassium 34mq daily   # CA125/CEA ratio is <25, Dr.Secord recommends colonoscopy 3 weeks after cycle 3 treatment, prior to her debulking surgery.  She agrees. I have talked to RN Kristi who will coordinate her care with gastroenterologist.  Planned 3rd cycle of treatment is 05/31/2021 if no delay occurs.  Anticipate colonoscopy to be around 06/21/21 - at that time, she will be anticoagulated about 2.5 months. Ok to hold off Eliquis for 2 days prior to colonoscopy and resume after procedure.   #Radiographic evidence of liver cirrhosis This is a new diagnosis for her. Normal PT, PTT, LFT. Negative hepatitis Refer to gastroenterology.    Orders Placed This Encounter  Procedures   UKoreaParacentesis    Standing Status:   Future    Standing Expiration Date:   05/10/2022    Order Specific Question:   If therapeutic, is there a maximum amount of fluid to be removed?  Answer:   Yes    Order Specific Question:   What is the maximum amount of fluide to be removed?    Answer:   4L    Order Specific Question:    Are labs required for specimen collection?    Answer:   No    Order Specific Question:   Is Albumin medication needed?    Answer:   No    Order Specific Question:   Reason for Exam (SYMPTOM  OR DIAGNOSIS REQUIRED)    Answer:   malignant ascites    Order Specific Question:   Preferred imaging location?    Answer:   [facility]   CBC with Differential/Platelet    Standing Status:   Future    Standing Expiration Date:   05/10/2022   CBC with Differential/Platelet    Standing Status:   Future    Standing Expiration Date:   05/10/2022     All questions were answered. The patient knows to call the clinic with any problems questions or concerns.  cc Adin Hector, MD    Return of visit: Weekly CBC x 2  3 weeks for cycle 3 carboplatin and taxol.   We spent sufficient time to discuss many aspect of care, questions were answered to patient's satisfaction. A total of 40 minutes was spent on this visit.  15 minutes counseling the patient on chemotherapy treatments, side effects of the treatment, management of symptoms. 5 minutes was spent on answering patient's questions.  Additional 20 minutes spend on evaluation and management of her acute infusion reaction to taxol.    Earlie Server, MD, PhD Hematology Oncology Organ at [facility] 05/10/2021

## 2021-05-10 NOTE — Progress Notes (Unsigned)
1047 Taxol started 1100 pt calls my name, when I approached pt she was flush, said she didn't feel well, taxol stopped, other nurses over to help See MAR for benadyrl, solumedrol and pepcid times, see vital signs for BP's,  1105 Dr Tasia Catchings at chair side, pt flush and anxious, states she has difficulty breathing, O2sats 96-98%on RA, oxygen started at 2L/[place] as ordered by Dr Tasia Catchings. Pt remained on oxygen to help ease anxiety.  1230 pt remained stable, carbo infused witihout difficulty. 1330 pt d/ced home after Dr Collie Siad plan for next tx gone over, medication information on abraxane given to pt

## 2021-05-11 ENCOUNTER — Ambulatory Visit
Admission: RE | Admit: 2021-05-11 | Discharge: 2021-05-11 | Disposition: A | Discharge status: Home / Self Care | Payer: BC Managed Care – PPO | Source: Ambulatory Visit | Attending: Oncology | Admitting: Oncology

## 2021-05-11 DIAGNOSIS — R180 Malignant ascites: Secondary | ICD-10-CM | POA: Insufficient documentation

## 2021-05-11 LAB — CA 125: Cancer Antigen (CA) 125: 52.6 U/mL — ABNORMAL HIGH (ref 0.0–38.1)

## 2021-05-14 ENCOUNTER — Telehealth: Payer: Self-pay

## 2021-05-14 ENCOUNTER — Telehealth: Payer: Self-pay | Admitting: *Deleted

## 2021-05-14 NOTE — Telephone Encounter (Signed)
Spoke with Ms. Brittany Montoya. She had cancelled her appointment with Dr. Pennie Rushing on 9/13. This was scheduled before her diagnosis of cancer and she did not think she needed to keep it. A new referral for cirrhosis has been sent along with the request for a colonoscopy to be performed three weeks after cycle 3 of chemo in preparation for her debulking surgery.

## 2021-05-14 NOTE — Telephone Encounter (Signed)
Patient called asking to speak with Dr Tasia Catchings regarding the fact that she is having difficulty swallowing her potassium tablets.

## 2021-05-14 NOTE — Telephone Encounter (Signed)
Spoke to pt and advised her to dissolve potassium tablet in water/ juice and drink. Pt says she will try and let us know if she has any trouble this way.

## 2021-05-17 ENCOUNTER — Encounter: Payer: Self-pay | Admitting: Obstetrics and Gynecology

## 2021-05-17 ENCOUNTER — Inpatient Hospital Stay: Payer: BC Managed Care – PPO | Attending: Oncology

## 2021-05-17 ENCOUNTER — Other Ambulatory Visit: Payer: Self-pay

## 2021-05-17 DIAGNOSIS — Z86711 Personal history of pulmonary embolism: Secondary | ICD-10-CM | POA: Insufficient documentation

## 2021-05-17 DIAGNOSIS — D696 Thrombocytopenia, unspecified: Secondary | ICD-10-CM | POA: Diagnosis not present

## 2021-05-17 DIAGNOSIS — C569 Malignant neoplasm of unspecified ovary: Secondary | ICD-10-CM | POA: Insufficient documentation

## 2021-05-17 DIAGNOSIS — F419 Anxiety disorder, unspecified: Secondary | ICD-10-CM | POA: Insufficient documentation

## 2021-05-17 DIAGNOSIS — M5030 Other cervical disc degeneration, unspecified cervical region: Secondary | ICD-10-CM | POA: Insufficient documentation

## 2021-05-17 DIAGNOSIS — M129 Arthropathy, unspecified: Secondary | ICD-10-CM | POA: Insufficient documentation

## 2021-05-17 DIAGNOSIS — R600 Localized edema: Secondary | ICD-10-CM | POA: Diagnosis not present

## 2021-05-17 DIAGNOSIS — E876 Hypokalemia: Secondary | ICD-10-CM | POA: Insufficient documentation

## 2021-05-17 DIAGNOSIS — R180 Malignant ascites: Secondary | ICD-10-CM | POA: Insufficient documentation

## 2021-05-17 DIAGNOSIS — K219 Gastro-esophageal reflux disease without esophagitis: Secondary | ICD-10-CM | POA: Diagnosis not present

## 2021-05-17 DIAGNOSIS — K7460 Unspecified cirrhosis of liver: Secondary | ICD-10-CM | POA: Diagnosis not present

## 2021-05-17 DIAGNOSIS — Z79899 Other long term (current) drug therapy: Secondary | ICD-10-CM | POA: Insufficient documentation

## 2021-05-17 DIAGNOSIS — C561 Malignant neoplasm of right ovary: Secondary | ICD-10-CM | POA: Insufficient documentation

## 2021-05-17 DIAGNOSIS — I10 Essential (primary) hypertension: Secondary | ICD-10-CM | POA: Diagnosis not present

## 2021-05-17 DIAGNOSIS — Z5111 Encounter for antineoplastic chemotherapy: Secondary | ICD-10-CM | POA: Diagnosis present

## 2021-05-17 DIAGNOSIS — G4730 Sleep apnea, unspecified: Secondary | ICD-10-CM | POA: Insufficient documentation

## 2021-05-17 DIAGNOSIS — K5909 Other constipation: Secondary | ICD-10-CM | POA: Insufficient documentation

## 2021-05-17 DIAGNOSIS — K766 Portal hypertension: Secondary | ICD-10-CM | POA: Insufficient documentation

## 2021-05-17 LAB — CBC WITH DIFFERENTIAL/PLATELET
Abs Immature Granulocytes: 0.09 10*3/uL — ABNORMAL HIGH (ref 0.00–0.07)
Basophils Absolute: 0 10*3/uL (ref 0.0–0.1)
Basophils Relative: 1 %
Eosinophils Absolute: 0.1 10*3/uL (ref 0.0–0.5)
Eosinophils Relative: 1 %
HCT: 30.7 % — ABNORMAL LOW (ref 36.0–46.0)
Hemoglobin: 10.4 g/dL — ABNORMAL LOW (ref 12.0–15.0)
Immature Granulocytes: 1 %
Lymphocytes Relative: 11 %
Lymphs Abs: 0.8 10*3/uL (ref 0.7–4.0)
MCH: 29.6 pg (ref 26.0–34.0)
MCHC: 33.9 g/dL (ref 30.0–36.0)
MCV: 87.5 fL (ref 80.0–100.0)
Monocytes Absolute: 0.4 10*3/uL (ref 0.1–1.0)
Monocytes Relative: 6 %
Neutro Abs: 5.5 10*3/uL (ref 1.7–7.7)
Neutrophils Relative %: 80 %
Platelets: 303 10*3/uL (ref 150–400)
RBC: 3.51 MIL/uL — ABNORMAL LOW (ref 3.87–5.11)
RDW: 16.5 % — ABNORMAL HIGH (ref 11.5–15.5)
WBC: 6.9 10*3/uL (ref 4.0–10.5)
nRBC: 0 % (ref 0.0–0.2)

## 2021-05-22 ENCOUNTER — Other Ambulatory Visit: Payer: Self-pay | Admitting: Oncology

## 2021-05-24 ENCOUNTER — Inpatient Hospital Stay: Payer: BC Managed Care – PPO

## 2021-05-24 ENCOUNTER — Other Ambulatory Visit: Payer: Self-pay

## 2021-05-24 DIAGNOSIS — Z5111 Encounter for antineoplastic chemotherapy: Secondary | ICD-10-CM | POA: Diagnosis not present

## 2021-05-24 DIAGNOSIS — C569 Malignant neoplasm of unspecified ovary: Secondary | ICD-10-CM

## 2021-05-24 LAB — CBC WITH DIFFERENTIAL/PLATELET
Abs Immature Granulocytes: 0.01 10*3/uL (ref 0.00–0.07)
Basophils Absolute: 0 10*3/uL (ref 0.0–0.1)
Basophils Relative: 0 %
Eosinophils Absolute: 0 10*3/uL (ref 0.0–0.5)
Eosinophils Relative: 1 %
HCT: 26.9 % — ABNORMAL LOW (ref 36.0–46.0)
Hemoglobin: 8.9 g/dL — ABNORMAL LOW (ref 12.0–15.0)
Immature Granulocytes: 0 %
Lymphocytes Relative: 23 %
Lymphs Abs: 0.9 10*3/uL (ref 0.7–4.0)
MCH: 29.4 pg (ref 26.0–34.0)
MCHC: 33.1 g/dL (ref 30.0–36.0)
MCV: 88.8 fL (ref 80.0–100.0)
Monocytes Absolute: 0.3 10*3/uL (ref 0.1–1.0)
Monocytes Relative: 8 %
Neutro Abs: 2.6 10*3/uL (ref 1.7–7.7)
Neutrophils Relative %: 68 %
Platelets: 116 10*3/uL — ABNORMAL LOW (ref 150–400)
RBC: 3.03 MIL/uL — ABNORMAL LOW (ref 3.87–5.11)
RDW: 16.3 % — ABNORMAL HIGH (ref 11.5–15.5)
WBC: 3.9 10*3/uL — ABNORMAL LOW (ref 4.0–10.5)
nRBC: 0 % (ref 0.0–0.2)

## 2021-05-24 LAB — COMPREHENSIVE METABOLIC PANEL
ALT: 23 U/L (ref 0–44)
AST: 35 U/L (ref 15–41)
Albumin: 3 g/dL — ABNORMAL LOW (ref 3.5–5.0)
Alkaline Phosphatase: 126 U/L (ref 38–126)
Anion gap: 8 (ref 5–15)
BUN: 9 mg/dL (ref 8–23)
CO2: 26 mmol/L (ref 22–32)
Calcium: 9.1 mg/dL (ref 8.9–10.3)
Chloride: 100 mmol/L (ref 98–111)
Creatinine, Ser: 0.86 mg/dL (ref 0.44–1.00)
GFR, Estimated: >60 mL/min (ref >60)
Glucose, Bld: 104 mg/dL — ABNORMAL HIGH (ref 70–99)
Potassium: 3.4 mmol/L — ABNORMAL LOW (ref 3.5–5.1)
Sodium: 134 mmol/L — ABNORMAL LOW (ref 135–145)
Total Bilirubin: <0.1 mg/dL — ABNORMAL LOW (ref 0.3–1.2)
Total Protein: 6.9 g/dL (ref 6.5–8.1)

## 2021-05-25 ENCOUNTER — Encounter: Payer: Self-pay | Admitting: Oncology

## 2021-05-25 ENCOUNTER — Telehealth: Payer: Self-pay

## 2021-05-25 ENCOUNTER — Other Ambulatory Visit: Payer: Self-pay | Admitting: Oncology

## 2021-05-25 ENCOUNTER — Other Ambulatory Visit: Payer: Self-pay

## 2021-05-25 LAB — CA 125: Cancer Antigen (CA) 125: 53.1 U/mL — ABNORMAL HIGH (ref 0.0–38.1)

## 2021-05-25 MED ORDER — ENOXAPARIN SODIUM 80 MG/0.8ML IJ SOSY: 80.0000 mg | PREFILLED_SYRINGE | Freq: Two times a day (BID) | INTRAMUSCULAR | 1 refills | Status: DC

## 2021-05-25 NOTE — Telephone Encounter (Signed)
Patient scheduled for port placement on 9/16. She will switching from Eliquis to Lovenox. Will hold Lovenox 12 hours to procedure.   Pt came in today for Lovenox adinistration teaching. Demonstrated how to administer injection and to rotate sites with every administration. Informed her that she may see some bruising at injection sites. She is to take last eliquis dose on Monday and she should start Loveox injections on Tuesday morning. Pt verbalized understanding.

## 2021-05-25 NOTE — Progress Notes (Signed)
Error

## 2021-05-29 ENCOUNTER — Encounter
Admission: RE | Admit: 2021-05-29 | Discharge: 2021-05-29 | Disposition: A | Discharge status: Home / Self Care | Payer: BC Managed Care – PPO | Source: Ambulatory Visit | Attending: General Surgery | Admitting: General Surgery

## 2021-05-29 ENCOUNTER — Ambulatory Visit: Payer: Self-pay | Admitting: General Surgery

## 2021-05-29 ENCOUNTER — Other Ambulatory Visit: Payer: Self-pay

## 2021-05-29 HISTORY — DX: Sleep apnea, unspecified: G47.30

## 2021-05-29 HISTORY — DX: Other ascites: R18.8

## 2021-05-29 HISTORY — DX: Unspecified cirrhosis of liver: K74.60

## 2021-05-29 HISTORY — DX: Other specified postprocedural states: Z98.890

## 2021-05-29 HISTORY — DX: Gastro-esophageal reflux disease without esophagitis: K21.9

## 2021-05-29 HISTORY — DX: Anxiety disorder, unspecified: F41.9

## 2021-05-29 HISTORY — DX: Other pulmonary embolism without acute cor pulmonale: I26.99

## 2021-05-29 HISTORY — DX: Nausea with vomiting, unspecified: R11.2

## 2021-05-29 HISTORY — DX: Other cervical disc degeneration, unspecified cervical region: M50.30

## 2021-05-29 NOTE — Patient Instructions (Addendum)
Your procedure is scheduled on: Friday, September 16 Report to the Registration Desk on the 1st floor of the [facility]. To find out your arrival time, please call [phone] between 1PM - 3PM on: Thursday, September 15  REMEMBER: Instructions that are not followed completely may result in serious medical risk, up to and including death; or upon the discretion of your surgeon and anesthesiologist your surgery may need to be rescheduled.  Do not eat food after midnight the night before surgery.  No gum chewing, lozengers or hard candies.  You may however, drink CLEAR liquids up to 2 hours before you are scheduled to arrive for your surgery. Do not drink anything within 2 hours of your scheduled arrival time.  Clear liquids include: - water  - apple juice without pulp - gatorade (not RED, PURPLE, OR BLUE) - black coffee or tea (Do NOT add milk or creamers to the coffee or tea) Do NOT drink anything that is not on this list.  TAKE THESE MEDICATIONS THE MORNING OF SURGERY WITH A SIP OF WATER:  Albuterol inhaler  Use inhalers on the day of surgery and bring to the hospital.  Follow recommendations from Cardiologist, Pulmonologist or PCP regarding stopping Eliquis.  Will hold Lovenox 12 hours to procedure.    Take last eliquis dose on Monday and she should start Loveox injections on Tuesday morning.  One week prior to surgery: Stop Anti-inflammatories (NSAIDS) such as Advil, Aleve, Ibuprofen, Motrin, Naproxen, Naprosyn and Aspirin based products such as Excedrin, Goodys Powder, BC Powder. Stop ANY OVER THE COUNTER supplements until after surgery. You may however, continue to take Tylenol if needed for pain up until the day of surgery.  No Alcohol for 24 hours before or after surgery.  No Smoking including e-cigarettes for 24 hours prior to surgery.  No chewable tobacco products for at least 6 hours prior to surgery.  No nicotine patches on the day of surgery.  Do not use any  "recreational" drugs for at least a week prior to your surgery.  Please be advised that the combination of cocaine and anesthesia may have negative outcomes, up to and including death. If you test positive for cocaine, your surgery will be cancelled.  On the morning of surgery brush your teeth with toothpaste and water, you may rinse your mouth with mouthwash if you wish. Do not swallow any toothpaste or mouthwash.  Do not wear jewelry, make-up, hairpins, clips or nail polish.  Do not wear lotions, powders, or perfumes.   Do not shave body from the neck down 48 hours prior to surgery just in case you cut yourself which could leave a site for infection.   Contact lenses, hearing aids and dentures may not be worn into surgery.  Do not bring valuables to the hospital. [facility] is not responsible for any missing/lost belongings or valuables.   Shower using antibacterial soap the day of surgery.  Notify your doctor if there is any change in your medical condition (cold, fever, infection).  Wear comfortable clothing (specific to your surgery type) to the hospital.  After surgery, you can help prevent lung complications by doing breathing exercises.  Take deep breaths and cough every 1-2 hours. Your doctor may order a device called an Incentive Spirometer to help you take deep breaths.  If you are being discharged the day of surgery, you will not be allowed to drive home. You will need a responsible adult (18 years or older) to drive you home and  stay with you that night.   If you are taking public transportation, you will need to have a responsible adult (18 years or older) with you. Please confirm with your physician that it is acceptable to use public transportation.   Please call the [place] [facility] at [phone] if you have any questions about these instructions.  Surgery Visitation Policy:  Patients undergoing a surgery or procedure may have one family member or  support person with them as long as that person is not COVID-19 positive or experiencing its symptoms.  That person may remain in the waiting area during the procedure.

## 2021-05-29 NOTE — H&P (View-Only) (Signed)
PATIENT PROFILE: Brittany Montoya is a 60 y.o. female who presents to the Clinic for consultation at the request of Dr. Tasia Catchings for evaluation of placement of Port-A-Cath.  PCP:  Cheryll Cockayne, MD  HISTORY OF PRESENT ILLNESS: Brittany Montoya reports patient was diagnosed with ovarian cancer.  At the moment of diagnosis she was also found with pulmonary embolism.  She has been treated with anticoagulation.  Due to the recent diagnosis of PE and the risk of holding anticoagulation she was started on chemotherapy through peripheral route.  She has been tolerating chemotherapy but recently it has been getting more difficult to have any vascular access peripherally.  Patient was consulted for insertion of Port-A-Cath.  Patient denies any pain.  No pain radiation.  No alleviating or aggravating factors.   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) 05/17/2021   Portal venous hypertension (CMS-HCC) 05/17/2021   Colon cancer screening 05/17/2021   Encounter for diagnostic colonoscopy due to change in bowel habits 05/17/2021   Constipation, chronic 05/17/2021   Mass of right ovary 03/26/2021   Overview    CT of the abdomen and pelvis with contrast reveals large solid and cystic right ovarian mass, 14 x 18 x 16 cm with evidence of ascites and subtle nodular studding      BMI 40.0-44.9, adult (CMS-HCC) (Chronic) 11/10/2019   Other hyperlipidemia 01/21/2019   Rotator cuff tendinitis, left 01/15/2016   Incomplete tear of left rotator cuff 01/15/2016   Essential hypertension 10/02/2015   Overview    Myoview negative for ischemia 7/20      Recurrent major depressive disorder, in full remission (CMS-HCC) 10/02/2015   Obstructive sleep apnea syndrome 10/02/2015   Overview    Mild. AHI 7.9 by sleep study June 2006.  Significant nocturnal hypoxia correlating  with apneic episodes, oxygen saturation 80% at lowest. Previously on 2 liters at night, discontinued at patient's request.      Glaucoma (increased eye pressure) Unknown   Overview    Dr. Tobe Sos      Venous stasis Unknown   DDD (degenerative disc disease), cervical Unknown   Overview    MRI 04/09/13 with left neural foraminal narrowing at C4-5, bilateral narrowing at C6.          GENERAL REVIEW OF SYSTEMS:   General ROS: negative for - chills, fatigue, fever, weight gain or weight loss Allergy and Immunology ROS: negative for - hives  Hematological and Lymphatic ROS: negative for - bleeding problems or bruising, negative for palpable nodes Endocrine ROS: negative for - heat or cold intolerance, hair changes Respiratory ROS: negative for - cough, shortness of breath or wheezing Cardiovascular ROS: no chest pain or palpitations GI ROS: negative for nausea, vomiting, abdominal pain, diarrhea, constipation Musculoskeletal ROS: negative for - joint swelling or muscle pain Neurological ROS: negative for - confusion, syncope Dermatological ROS: negative for pruritus and rash Psychiatric: negative for anxiety, depression, difficulty sleeping and memory loss  MEDICATIONS: Current Outpatient Medications  Medication Sig Dispense Refill   acetaminophen (TYLENOL) 500 MG tablet Take 1,000 mg by mouth as needed for Pain.     albuterol 90 mcg/actuation inhaler Inhale 2 inhalations into the lungs every 6 (six) hours as needed for Wheezing 18 g 3   apixaban (ELIQUIS) 5 mg tablet Take  5 mg by mouth 2 (two) times daily     hydroCHLOROthiazide (HYDRODIURIL) 12.5 MG tablet Take 1 tablet (12.5 mg total) by mouth once daily 90 tablet 0   losartan (COZAAR) 50 MG tablet Take 1 tablet (50 mg total) by mouth once daily 90 tablet 3   multivitamin tablet Take 1 tablet by mouth once daily.     timolol maleate (TIMOPTIC) 0.5 % ophthalmic solution Place 1 drop into both eyes once daily.       traMADoL  (ULTRAM) 50 mg tablet Take 1 tablet (50 mg total) by mouth every 6 (six) hours as needed for Pain 20 tablet 2   triamcinolone 0.1 % cream Apply topically 2 (two) times daily as needed 15 g 1   No current facility-administered medications for this visit.    ALLERGIES: Paclitaxel, Norvasc [amlodipine], Penicillin, and Penicillin v potassium  PAST MEDICAL HISTORY: Past Medical History:  Diagnosis Date   DDD (degenerative disc disease), cervical    MRI 04/09/13 with left neural foraminal narrowing at C4-5, bilateral narrowing at C6.   Depression    mild   Glaucoma (increased eye pressure)    Dr. Brennan   H/O mammogram 09/02/2012   Hypertension    Migraines    Sleep apnea    Mild. AHI 7.9 by sleep study June 2006.  Significant nocturnal hypoxia correlating with apneic episodes, oxygen saturation 80% at lowest. Previously on 2 liters at night, discontinued at patient's request.   Venous stasis     PAST SURGICAL HISTORY: Past Surgical History:  Procedure Laterality Date   CESAREAN SECTION     x2   CHOLECYSTECTOMY     COLONOSCOPY  06/05/2011   Adenomatous Polyps   COLONOSCOPY  07/20/2014   PH Adenomatous Polyp: CBF 07/2019   HYSTERECTOMY  09/16/2006   TAH   PILONIDAL CYST / SINUS EXCISION       FAMILY HISTORY: Family History  Problem Relation Age of Onset   Breast cancer Mother 35   Coronary Artery Disease (Blocked arteries around heart) Mother    Breast cancer Maternal Aunt    Diabetes type II Other    Colon cancer Neg Hx      SOCIAL HISTORY: Social History   Socioeconomic History   Marital status: Married  Tobacco Use   Smoking status: Never Smoker   Smokeless tobacco: Never Used  Vaping Use   Vaping Use: Never used  Substance and Sexual Activity   Alcohol use: No    Alcohol/week: 0.0 standard drinks   Sexual activity: Not Currently    Birth control/protection: Surgical    PHYSICAL EXAM: Vitals:   05/29/21 1115  BP: (!) 125/92  Pulse: 97   Body mass  index is 34.77 kg/m. Weight: 83.5 kg (184 lb)   GENERAL: Alert, active, oriented x3  HEENT: Pupils equal reactive to light. Extraocular movements are intact. Sclera clear. Palpebral conjunctiva normal red color.Pharynx clear.  NECK: Supple with no palpable mass and no adenopathy.  LUNGS: Sound clear with no rales rhonchi or wheezes.  HEART: Regular rhythm S1 and S2 without murmur.  ABDOMEN: Soft and depressible, nontender with no palpable mass, no hepatomegaly.   EXTREMITIES: Well-developed well-nourished symmetrical with no dependent edema.  NEUROLOGICAL: Awake alert oriented, facial expression symmetrical, moving all extremities.  REVIEW OF DATA: I have reviewed the following data today: Office Visit on 04/04/2021  Component Date Value   Hemoccult Sensa 04/04/2021 Negative    Internal ICT QC #1 04/04/2021 Valid   Appointment on   04/03/2021  Component Date Value   Cancer Antigen (CA) 125 * 04/03/2021 70.9 (!)   HE4 - [facility] 04/03/2021 409.0 (!)  Appointment on 03/13/2021  Component Date Value   Glucose 03/13/2021 98    Sodium 03/13/2021 139    Potassium 03/13/2021 3.8    Chloride 03/13/2021 101    Carbon Dioxide (CO2) 03/13/2021 32.1 (!)   Urea Nitrogen (BUN) 03/13/2021 8    Creatinine 03/13/2021 0.8    Glomerular Filtration Ra* 03/13/2021 73    Calcium 03/13/2021 9.8    AST  03/13/2021 25    ALT  03/13/2021 13    Alk Phos (alkaline Phosp* 03/13/2021 87    Albumin 03/13/2021 3.6    Bilirubin, Total 03/13/2021 0.8    Protein, Total 03/13/2021 6.4    A/G Ratio 03/13/2021 1.3    WBC (White Blood Cell Co* 03/13/2021 10.8 (!)   RBC (Red Blood Cell Coun* 03/13/2021 4.36    Hemoglobin 03/13/2021 13.2    Hematocrit 03/13/2021 39.8    MCV (Mean Corpuscular Vo* 03/13/2021 91.3    MCH (Mean Corpuscular He* 03/13/2021 30.3    MCHC (Mean Corpuscular H* 03/13/2021 33.2    Platelet Count 03/13/2021 379    RDW-CV (Red Cell Distrib* 03/13/2021 13.9    MPV (Mean Platelet  Volum* 03/13/2021 10.6    Neutrophils 03/13/2021 9.03 (!)   Lymphocytes 03/13/2021 0.92 (!)   Monocytes 03/13/2021 0.49    Eosinophils 03/13/2021 0.26    Basophils 03/13/2021 0.07    Neutrophil % 03/13/2021 83.8 (!)   Lymphocyte % 03/13/2021 8.5 (!)   Monocyte % 03/13/2021 4.5    Eosinophil % 03/13/2021 2.4    Basophil% 03/13/2021 0.6    Immature Granulocyte % 03/13/2021 0.2    Immature Granulocyte Cou* 03/13/2021 0.02    Cholesterol, Total 03/13/2021 228 (!)   Triglyceride 03/13/2021 148    HDL (High Density Lipopr* 03/13/2021 41.3    LDL Calculated 03/13/2021 157 (!)   VLDL Cholesterol 03/13/2021 30    Cholesterol/HDL Ratio 03/13/2021 5.5    Creatinine, Random Urine 03/13/2021 68.3    Urine Albumin, Random 03/13/2021 <7    Urine Albumin/Creatinine* 03/13/2021 <10.2    Thyroid Stimulating Horm* 03/13/2021 4.003    Color 03/13/2021 Yellow    Clarity 03/13/2021 Clear    Specific Gravity 03/13/2021 <=1.005    pH, Urine 03/13/2021 6.0    Protein, Urinalysis 03/13/2021 Negative    Glucose, Urinalysis 03/13/2021 Negative    Ketones, Urinalysis 03/13/2021 Negative    Blood, Urinalysis 03/13/2021 Negative    Nitrite, Urinalysis 03/13/2021 Negative    Leukocyte Esterase, Urin* 03/13/2021 Negative    White Blood Cells, Urina* 03/13/2021 None Seen    Red Blood Cells, Urinaly* 03/13/2021 None Seen    Bacteria, Urinalysis 03/13/2021 Few (!)   Squamous Epithelial Cell* 03/13/2021 Few      ASSESSMENT: Ms. Pai is a 60 y.o. female presenting for consultation for insertion of Port-A-Cath.  Patient with stage III ovarian cancer.  She is currently on chemotherapy.  She is receiving chemotherapy through peripheral vascular access.  He has been getting more difficult to found her small veins.  As per discussion with medical oncologist.  She will be bridged to therapeutic Lovenox to be able to hold 1 dose of Lovenox before surgery.  That way we will reduce the risk of any complication from  her PEs.  We will also decrease her risk of bleeding.  Patient understand the high risk of bleeding due to therapeutic anticoagulation.    The patient was oriented about the procedure of placement of the Chemo-Port.  She was oriented about the benefit of the Chemo-Port.  She was also oriented about the risk of surgery include bleeding, infection, pneumothorax, hemothorax, arteriovenous fistula, pain, catheter related infection, among others.  She reports she understood and agreed to proceed.  Carcinoma of ovary, stage 3, right (CMS-HCC) [C56.1]  PLAN: 1. Insertion of Port a Cath (36561, 77001, [id number]) 2.  Apply Lovenox as instructed by Dr. Yu 3. Contact us if has any question or concern.    Patient verbalized understanding, all questions were answered, and were agreeable with the plan outlined above.    Ander Wamser Cintron-Diaz, MD  Electronically signed by Keyerra Lamere Cintron-Diaz, MD 

## 2021-05-29 NOTE — H&P (Signed)
PATIENT PROFILE: Brittany Montoya is a 60 y.o. female who presents to the Clinic for consultation at the request of Dr. Tasia Catchings for evaluation of placement of Port-A-Cath.  PCP:  Cheryll Cockayne, MD  HISTORY OF PRESENT ILLNESS: [name] reports patient was diagnosed with ovarian cancer.  At the moment of diagnosis she was also found with pulmonary embolism.  She has been treated with anticoagulation.  Due to the recent diagnosis of PE and the risk of holding anticoagulation she was started on chemotherapy through peripheral route.  She has been tolerating chemotherapy but recently it has been getting more difficult to have any vascular access peripherally.  Patient was consulted for insertion of Port-A-Cath.  Patient denies any pain.  No pain radiation.  No alleviating or aggravating factors.   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) 05/17/2021   Portal venous hypertension (CMS-HCC) 05/17/2021   Colon cancer screening 05/17/2021   Encounter for diagnostic colonoscopy due to change in bowel habits 05/17/2021   Constipation, chronic 05/17/2021   Mass of right ovary 03/26/2021   Overview    CT of the abdomen and pelvis with contrast reveals large solid and cystic right ovarian mass, 14 x 18 x 16 cm with evidence of ascites and subtle nodular studding      BMI 40.0-44.9, adult (CMS-HCC) (Chronic) 11/10/2019   Other hyperlipidemia 01/21/2019   Rotator cuff tendinitis, left 01/15/2016   Incomplete tear of left rotator cuff 01/15/2016   Essential hypertension 10/02/2015   Overview    Myoview negative for ischemia 7/20      Recurrent major depressive disorder, in full remission (CMS-HCC) 10/02/2015   Obstructive sleep apnea syndrome 10/02/2015   Overview    Mild. AHI 7.9 by sleep study June 2006.  Significant nocturnal hypoxia correlating  with apneic episodes, oxygen saturation 80% at lowest. Previously on 2 liters at night, discontinued at patient's request.      Glaucoma (increased eye pressure) Unknown   Overview    Dr. Tobe Sos      Venous stasis Unknown   DDD (degenerative disc disease), cervical Unknown   Overview    MRI 04/09/13 with left neural foraminal narrowing at C4-5, bilateral narrowing at C6.          GENERAL REVIEW OF SYSTEMS:   General ROS: negative for - chills, fatigue, fever, weight gain or weight loss Allergy and Immunology ROS: negative for - hives  Hematological and Lymphatic ROS: negative for - bleeding problems or bruising, negative for palpable nodes Endocrine ROS: negative for - heat or cold intolerance, hair changes Respiratory ROS: negative for - cough, shortness of breath or wheezing Cardiovascular ROS: no chest pain or palpitations GI ROS: negative for nausea, vomiting, abdominal pain, diarrhea, constipation Musculoskeletal ROS: negative for - joint swelling or muscle pain Neurological ROS: negative for - confusion, syncope Dermatological ROS: negative for pruritus and rash Psychiatric: negative for anxiety, depression, difficulty sleeping and memory loss  MEDICATIONS: Current Outpatient Medications  Medication Sig Dispense Refill   acetaminophen (TYLENOL) 500 MG tablet Take 1,000 mg by mouth as needed for Pain.     albuterol 90 mcg/actuation inhaler Inhale 2 inhalations into the lungs every 6 (six) hours as needed for Wheezing 18 g 3   apixaban (ELIQUIS) 5 mg tablet Take  5 mg by mouth 2 (two) times daily     hydroCHLOROthiazide (HYDRODIURIL) 12.5 MG tablet Take 1 tablet (12.5 mg total) by mouth once daily 90 tablet 0   losartan (COZAAR) 50 MG tablet Take 1 tablet (50 mg total) by mouth once daily 90 tablet 3   multivitamin tablet Take 1 tablet by mouth once daily.     timolol maleate (TIMOPTIC) 0.5 % ophthalmic solution Place 1 drop into both eyes once daily.       traMADoL  (ULTRAM) 50 mg tablet Take 1 tablet (50 mg total) by mouth every 6 (six) hours as needed for Pain 20 tablet 2   triamcinolone 0.1 % cream Apply topically 2 (two) times daily as needed 15 g 1   No current facility-administered medications for this visit.    ALLERGIES: Paclitaxel, Norvasc [amlodipine], Penicillin, and Penicillin v potassium  PAST MEDICAL HISTORY: Past Medical History:  Diagnosis Date   DDD (degenerative disc disease), cervical    MRI 04/09/13 with left neural foraminal narrowing at C4-5, bilateral narrowing at C6.   Depression    mild   Glaucoma (increased eye pressure)    Dr. Tobe Sos   H/O mammogram 09/02/2012   Hypertension    Migraines    Sleep apnea    Mild. AHI 7.9 by sleep study June 2006.  Significant nocturnal hypoxia correlating with apneic episodes, oxygen saturation 80% at lowest. Previously on 2 liters at night, discontinued at patient's request.   Venous stasis     PAST SURGICAL HISTORY: Past Surgical History:  Procedure Laterality Date   CESAREAN SECTION     x2   CHOLECYSTECTOMY     COLONOSCOPY  06/05/2011   Adenomatous Polyps   COLONOSCOPY  07/20/2014   PH Adenomatous Polyp: [facility] 07/2019   HYSTERECTOMY  09/16/2006   TAH   PILONIDAL CYST / SINUS EXCISION       FAMILY HISTORY: Family History  Problem Relation Age of Onset   Breast cancer Mother 36   Coronary Artery Disease (Blocked arteries around heart) Mother    Breast cancer Maternal Aunt    Diabetes type II Other    Colon cancer Neg Hx      SOCIAL HISTORY: Social History   Socioeconomic History   Marital status: Married  Tobacco Use   Smoking status: Never Smoker   Smokeless tobacco: Never Used  Scientific laboratory technician Use: Never used  Substance and Sexual Activity   Alcohol use: No    Alcohol/week: 0.0 standard drinks   Sexual activity: Not Currently    Birth control/protection: Surgical    PHYSICAL EXAM: Vitals:   05/29/21 1115  BP: (!) 125/92  Pulse: 97   Body mass  index is 34.77 kg/m. Weight: 83.5 kg (184 lb)   GENERAL: Alert, active, oriented x3  HEENT: Pupils equal reactive to light. Extraocular movements are intact. Sclera clear. Palpebral conjunctiva normal red color.Pharynx clear.  NECK: Supple with no palpable mass and no adenopathy.  LUNGS: Sound clear with no rales rhonchi or wheezes.  HEART: Regular rhythm S1 and S2 without murmur.  ABDOMEN: Soft and depressible, nontender with no palpable mass, no hepatomegaly.   EXTREMITIES: Well-developed well-nourished symmetrical with no dependent edema.  NEUROLOGICAL: Awake alert oriented, facial expression symmetrical, moving all extremities.  REVIEW OF DATA: I have reviewed the following data today: Office Visit on 04/04/2021  Component Date Value   Hemoccult Sensa 04/04/2021 Negative    Internal ICT QC #1 04/04/2021 Valid   Appointment on  04/03/2021  Component Date Value   Cancer Antigen (CA) 125 * 04/03/2021 70.9 (!)   HE4 - [facility] 04/03/2021 409.0 (!)  Appointment on 03/13/2021  Component Date Value   Glucose 03/13/2021 98    Sodium 03/13/2021 139    Potassium 03/13/2021 3.8    Chloride 03/13/2021 101    Carbon Dioxide (CO2) 03/13/2021 32.1 (!)   Urea Nitrogen (BUN) 03/13/2021 8    Creatinine 03/13/2021 0.8    Glomerular Filtration Ra* 03/13/2021 73    Calcium 03/13/2021 9.8    AST  03/13/2021 25    ALT  03/13/2021 13    Alk Phos (alkaline Phosp* 03/13/2021 87    Albumin 03/13/2021 3.6    Bilirubin, Total 03/13/2021 0.8    Protein, Total 03/13/2021 6.4    A/G Ratio 03/13/2021 1.3    WBC (White Blood Cell Co* 03/13/2021 10.8 (!)   RBC (Red Blood Cell Coun* 03/13/2021 4.36    Hemoglobin 03/13/2021 13.2    Hematocrit 03/13/2021 39.8    MCV (Mean Corpuscular Vo* 03/13/2021 91.3    MCH (Mean Corpuscular He* 03/13/2021 30.3    MCHC (Mean Corpuscular H* 03/13/2021 33.2    Platelet Count 03/13/2021 379    RDW-CV (Red Cell Distrib* 03/13/2021 13.9    MPV (Mean Platelet  Volum* 03/13/2021 10.6    Neutrophils 03/13/2021 9.03 (!)   Lymphocytes 03/13/2021 0.92 (!)   Monocytes 03/13/2021 0.49    Eosinophils 03/13/2021 0.26    Basophils 03/13/2021 0.07    Neutrophil % 03/13/2021 83.8 (!)   Lymphocyte % 03/13/2021 8.5 (!)   Monocyte % 03/13/2021 4.5    Eosinophil % 03/13/2021 2.4    Basophil% 03/13/2021 0.6    Immature Granulocyte % 03/13/2021 0.2    Immature Granulocyte Cou* 03/13/2021 0.02    Cholesterol, Total 03/13/2021 228 (!)   Triglyceride 03/13/2021 148    HDL (High Density Lipopr* 03/13/2021 41.3    LDL Calculated 03/13/2021 157 (!)   VLDL Cholesterol 03/13/2021 30    Cholesterol/HDL Ratio 03/13/2021 5.5    Creatinine, Random Urine 03/13/2021 68.3    Urine Albumin, Random 03/13/2021 <7    Urine Albumin/Creatinine* 03/13/2021 <10.2    Thyroid Stimulating Horm* 03/13/2021 4.003    Color 03/13/2021 Yellow    Clarity 03/13/2021 Clear    Specific Gravity 03/13/2021 <=1.005    pH, Urine 03/13/2021 6.0    Protein, Urinalysis 03/13/2021 Negative    Glucose, Urinalysis 03/13/2021 Negative    Ketones, Urinalysis 03/13/2021 Negative    Blood, Urinalysis 03/13/2021 Negative    Nitrite, Urinalysis 03/13/2021 Negative    Leukocyte Esterase, Urin* 03/13/2021 Negative    White Blood Cells, Urina* 03/13/2021 None Seen    Red Blood Cells, Urinaly* 03/13/2021 None Seen    Bacteria, Urinalysis 03/13/2021 Few (!)   Squamous Epithelial Cell* 03/13/2021 Few      ASSESSMENT: Ms. Girvan is a 60 y.o. female presenting for consultation for insertion of Port-A-Cath.  Patient with stage III ovarian cancer.  She is currently on chemotherapy.  She is receiving chemotherapy through peripheral vascular access.  He has been getting more difficult to found her small veins.  As per discussion with medical oncologist.  She will be bridged to therapeutic Lovenox to be able to hold 1 dose of Lovenox before surgery.  That way we will reduce the risk of any complication from  her PEs.  We will also decrease her risk of bleeding.  Patient understand the high risk of bleeding due to therapeutic anticoagulation.  The patient was oriented about the procedure of placement of the Chemo-Port.  She was oriented about the benefit of the Chemo-Port.  She was also oriented about the risk of surgery include bleeding, infection, pneumothorax, hemothorax, arteriovenous fistula, pain, catheter related infection, among others.  She reports she understood and agreed to proceed.  Carcinoma of ovary, stage 3, right (CMS-HCC) [C56.1]  PLAN: 1. Insertion of Port a Cath [phone], [id number], [id number]) 2.  Apply Lovenox as instructed by Dr. Tasia Catchings 3. Contact us if has any question or concern.    Patient verbalized understanding, all questions were answered, and were agreeable with the plan outlined above.    Herbert Pun, MD  Electronically signed by Herbert Pun, MD

## 2021-05-31 ENCOUNTER — Encounter: Payer: Self-pay | Admitting: Oncology

## 2021-05-31 ENCOUNTER — Inpatient Hospital Stay (HOSPITAL_BASED_OUTPATIENT_CLINIC_OR_DEPARTMENT_OTHER): Payer: BC Managed Care – PPO | Admitting: Oncology

## 2021-05-31 ENCOUNTER — Inpatient Hospital Stay: Payer: BC Managed Care – PPO

## 2021-05-31 ENCOUNTER — Other Ambulatory Visit: Payer: Self-pay

## 2021-05-31 VITALS — BP 137/54 | HR 94 | Temp 97.4°F | Resp 18 | Wt 190.0 lb

## 2021-05-31 DIAGNOSIS — R180 Malignant ascites: Secondary | ICD-10-CM

## 2021-05-31 DIAGNOSIS — Z5111 Encounter for antineoplastic chemotherapy: Secondary | ICD-10-CM | POA: Diagnosis not present

## 2021-05-31 DIAGNOSIS — K7469 Other cirrhosis of liver: Secondary | ICD-10-CM | POA: Diagnosis not present

## 2021-05-31 DIAGNOSIS — C561 Malignant neoplasm of right ovary: Secondary | ICD-10-CM | POA: Diagnosis not present

## 2021-05-31 DIAGNOSIS — I2699 Other pulmonary embolism without acute cor pulmonale: Secondary | ICD-10-CM | POA: Diagnosis not present

## 2021-05-31 DIAGNOSIS — C569 Malignant neoplasm of unspecified ovary: Secondary | ICD-10-CM

## 2021-05-31 LAB — COMPREHENSIVE METABOLIC PANEL
ALT: 20 U/L (ref 0–44)
AST: 37 U/L (ref 15–41)
Albumin: 3.3 g/dL — ABNORMAL LOW (ref 3.5–5.0)
Alkaline Phosphatase: 105 U/L (ref 38–126)
Anion gap: 10 (ref 5–15)
BUN: 13 mg/dL (ref 8–23)
CO2: 23 mmol/L (ref 22–32)
Calcium: 9.5 mg/dL (ref 8.9–10.3)
Chloride: 99 mmol/L (ref 98–111)
Creatinine, Ser: 0.9 mg/dL (ref 0.44–1.00)
GFR, Estimated: >60 mL/min (ref >60)
Glucose, Bld: 133 mg/dL — ABNORMAL HIGH (ref 70–99)
Potassium: 3.2 mmol/L — ABNORMAL LOW (ref 3.5–5.1)
Sodium: 132 mmol/L — ABNORMAL LOW (ref 135–145)
Total Bilirubin: 0.4 mg/dL (ref 0.3–1.2)
Total Protein: 7 g/dL (ref 6.5–8.1)

## 2021-05-31 LAB — CBC WITH DIFFERENTIAL/PLATELET
Abs Immature Granulocytes: 0.02 10*3/uL (ref 0.00–0.07)
Basophils Absolute: 0 10*3/uL (ref 0.0–0.1)
Basophils Relative: 0 %
Eosinophils Absolute: 0 10*3/uL (ref 0.0–0.5)
Eosinophils Relative: 0 %
HCT: 26.3 % — ABNORMAL LOW (ref 36.0–46.0)
Hemoglobin: 8.9 g/dL — ABNORMAL LOW (ref 12.0–15.0)
Immature Granulocytes: 0 %
Lymphocytes Relative: 18 %
Lymphs Abs: 1 10*3/uL (ref 0.7–4.0)
MCH: 30.1 pg (ref 26.0–34.0)
MCHC: 33.8 g/dL (ref 30.0–36.0)
MCV: 88.9 fL (ref 80.0–100.0)
Monocytes Absolute: 0.3 10*3/uL (ref 0.1–1.0)
Monocytes Relative: 5 %
Neutro Abs: 4.1 10*3/uL (ref 1.7–7.7)
Neutrophils Relative %: 77 %
Platelets: 56 10*3/uL — ABNORMAL LOW (ref 150–400)
RBC: 2.96 MIL/uL — ABNORMAL LOW (ref 3.87–5.11)
RDW: 17.7 % — ABNORMAL HIGH (ref 11.5–15.5)
WBC: 5.4 10*3/uL (ref 4.0–10.5)
nRBC: 0 % (ref 0.0–0.2)

## 2021-05-31 MED ORDER — POTASSIUM CHLORIDE CRYS ER 20 MEQ PO TBCR: 20.0000 meq | EXTENDED_RELEASE_TABLET | Freq: Two times a day (BID) | ORAL | 0 refills | Status: DC

## 2021-05-31 NOTE — Progress Notes (Signed)
  Perioperative Services Pre-Admission/Anesthesia Testing     Date: 05/31/21  Name: Brittany Montoya MRN:   [id number]  Re: Change in [place] for upcoming surgery   Case: [id number] Date/Time: 06/01/21 1054   Procedure: INSERTION PORT-A-CATH (Chest)   Anesthesia type: General   Pre-op diagnosis: Malignant neoplasm of rt ovary / malignant ascites   Location: [facility] OR ROOM 04 / [facility] ORS FOR ANESTHESIA [facility]   Surgeons: Herbert Pun, MD     Primary attending surgeon was consulted regarding consideration of therapeutic change in antimicrobial agent being used for preoperative prophylaxis in this patient's upcoming surgical case. Following analysis of the risk versus benefits, Dr. Herbert Pun, MD advising that it would be acceptable to discontinue the ordered clindamycin and place an order for cefazolin 2 gm IV on call to the OR. Orders for this patient were amended by me following collaborative conversation with attending surgeon taking into consideration of risk versus benefits of the therapy change.  Honor Loh, MSN, APRN, FNP-C, CEN [facility]  Peri-operative Services Nurse Practitioner Phone: [phone] 05/31/21 10:16 AM

## 2021-05-31 NOTE — Progress Notes (Signed)
Patient here today for follow up and chemotherapy treatment. She denies any new complaints. She has some mild nausea, but no vomiting. She has some abdominal pain which is ongoing, but not new. She states that she is having her port a cath placement tomorrow.

## 2021-05-31 NOTE — Progress Notes (Signed)
Hematology/Oncology progress note    Patient Care Team: Adin Hector, MD as PCP - General (Internal Medicine) Earlie Server, MD as Consulting Physician (Hematology and Oncology) Clent Jacks, RN as Oncology Nurse Navigator  REFERRING PROVIDER: Adin Hector, MD  CHIEF COMPLAINTS/REASON FOR VISIT:  Malignant ascites, ovarian neoplasm, pulmonary embolism  HISTORY OF PRESENTING ILLNESS:   Brittany Montoya is a  60 y.o.  female with PMH listed below was seen in consultation at the request of  Adin Hector, MD  for evaluation of complicated ovarian cyst, ascites  Patient has noticed generalized abdominal distention and bloating, nausea and constipation for the past months and  03/23/2021, CT abdomen pelvis with contrast showed large solid and cystic right ovarian mass measuring 14.1 x 18.1 x 16 cm highly suspicious for primary ovarian malignancy.  Evidence of peritoneal spread.  Moderate associated ascites. Slightly small liver with mild nodular contour suggesting a degree of cirrhosis.  04/03/2021, CA125 70.9, Hg 4 400 09.  04/04/2021, was seen by gynecology Dr. Ouida Sills.  And was referred to establish care with GYN oncology. 04/09/2021, patient underwent paracentesis and had 6.3 L of hazy yellow fluid removed.  Cytology is pending.  04/12/2021 patient was referred to see Dr. Theora Gianotti and me.  Patient also has noticed bilateral lower extremity edema, progressively worsening.  Fatigued, shortness of breath with exertion.  Denies any chest pain, cough.  Poor oral intake due to decreased appetite She was accompanied by her husband. Denies any alcohol use or previous hepatitis infection.  She is not aware about cirrhosis. She reports some symptom relief after the paracentesis.  Her abdomen seems to got better and getting worse again.    INTERVAL HISTORY Brittany Montoya is a 60 y.o. female who has above history reviewed by me today presents for follow up visit for  malignant ascites, ovarian neoplasm, pulmonary embolism.  Problems and complaints are listed below: Accompanied by daughter.  She has switched to Lovenox '8mg'$  SQ q 12 hours. She tolerates wells no bleeding events.  She has appt with Dr.Cintron tomorrow for medi port placement.  She has had abdominal pressure like discomfort, relieved with previous paracentesis, slightly worse comparing to right after procedure.  No fever, chills, nausea, vomiting.  SOB has improved.    Review of Systems  Constitutional:  Positive for appetite change and fatigue. Negative for chills and fever.  HENT:   Negative for hearing loss and voice change.   Eyes:  Negative for eye problems.  Respiratory:  Positive for shortness of breath. Negative for chest tightness and cough.   Cardiovascular:  Negative for chest pain.  Gastrointestinal:  Positive for abdominal distention and constipation. Negative for abdominal pain and blood in stool.  Endocrine: Negative for hot flashes.  Genitourinary:  Negative for difficulty urinating and frequency.   Musculoskeletal:  Negative for arthralgias.  Skin:  Negative for itching and rash.  Neurological:  Negative for extremity weakness.  Hematological:  Negative for adenopathy.  Psychiatric/Behavioral:  Negative for confusion. The patient is not nervous/anxious.    MEDICAL HISTORY:  Past Medical History:  Diagnosis Date   Anxiety    Arthritis    Ascites    Cirrhosis of liver (HCC)    DDD (degenerative disc disease), cervical    Family history of breast cancer    GERD (gastroesophageal reflux disease)    Glaucoma    Hypertension    Ovarian cancer ([place]) 04/15/2021   PONV (postoperative nausea and vomiting)  Pulmonary embolism ([place])    Sleep apnea     SURGICAL HISTORY: Past Surgical History:  Procedure Laterality Date   ABDOMINAL HYSTERECTOMY N/A 2008   may have had left ovary removed   [place]      2012, 2015, 2020   PARACENTESIS  04/20/2021    SOCIAL HISTORY: Social History   Socioeconomic History   Marital status: Married    Spouse name: Barnabas Lister   Number of children: 2   Years of education: Not on file   Highest education level: Not on file  Occupational History   Not on file  Tobacco Use   Smoking status: Never   Smokeless tobacco: Never  Vaping Use   Vaping Use: Never used  Substance and Sexual Activity   Alcohol use: No   Drug use: No   Sexual activity: Not on file  Other Topics Concern   Not on file  Social History Narrative   Not on file   Social Determinants of Health   Financial Resource Strain: Not on file  Food Insecurity: Not on file  Transportation Needs: Not on file  Physical Activity: Not on file  Stress: Not on file  Social Connections: Not on file  Intimate Partner Violence: Not on file    FAMILY HISTORY: Family History  Problem Relation Age of Onset   Breast cancer Mother 85   Breast cancer Maternal Aunt        d. under 12    ALLERGIES:  is allergic to paclitaxel, amlodipine, and penicillins.  MEDICATIONS:  Current Outpatient Medications  Medication Sig Dispense Refill   acetaminophen (TYLENOL) 500 MG tablet Take 1,000 mg by mouth at bedtime.     albuterol (VENTOLIN HFA) 108 (90 Base) MCG/ACT inhaler Inhale 1-2 puffs into the lungs every 6 (six) hours as needed for wheezing or shortness of breath.     apixaban (ELIQUIS) 5 MG TABS tablet Take 5 mg by mouth 2 (two) times daily.     clonazePAM (KLONOPIN) 0.5 MG tablet Take 1 tablet (0.5 mg total) by mouth daily as needed for anxiety. (Patient taking differently: Take 0.5 mg by mouth at bedtime.) 30 tablet 0   dexamethasone (DECADRON) 4 MG tablet Take 2 tablets (8 mg total) by mouth See admin instructions. Take '8mg'$  daily for 2 days prior to your chemotherapy 30 tablet 0   enoxaparin (LOVENOX) 80 MG/0.8ML injection Inject 0.8 mLs (80 mg total) into the skin every 12 (twelve) hours. 48 mL 1    hydrochlorothiazide (HYDRODIURIL) 12.5 MG tablet Take 12.5 mg by mouth daily.     latanoprost (XALATAN) 0.005 % ophthalmic solution Place 1 drop into both eyes at bedtime.     lidocaine-prilocaine (EMLA) cream Apply to affected area once (Patient taking differently: Apply 1 application topically daily as needed (pain). Apply to affected area once) 30 g 3   losartan (COZAAR) 50 MG tablet Take 50 mg by mouth daily.     Multiple Vitamin (MULTIVITAMIN WITH MINERALS) TABS tablet Take 1 tablet by mouth daily.     ondansetron (ZOFRAN) 8 MG tablet Take 1 tablet (8 mg total) by mouth 2 (two) times daily as needed for refractory nausea / vomiting. Start on day 3 after carboplatin chemo. 30 tablet 1   potassium chloride SA (KLOR-CON) 20 MEQ tablet Take 1 tablet (20 mEq total) by mouth daily. 30 tablet 0   prochlorperazine (COMPAZINE) 10 MG tablet Take  1 tablet (10 mg total) by mouth every 6 (six) hours as needed (Nausea or vomiting). 30 tablet 1   triamcinolone cream (KENALOG) 0.1 % Apply 1 application topically daily as needed (irritation).     No current facility-administered medications for this visit.   Facility-Administered Medications Ordered in Other Visits  Medication Dose Route Frequency Provider Last Rate Last Admin   LORazepam (ATIVAN) injection 0.5 mg  0.5 mg Intravenous Once PRN Earlie Server, MD         PHYSICAL EXAMINATION: ECOG PERFORMANCE STATUS: 1 - Symptomatic but completely ambulatory There were no vitals filed for this visit.  There were no vitals filed for this visit.   Physical Exam Constitutional:      General: She is not in acute distress.    Appearance: She is obese.  HENT:     Head: Normocephalic and atraumatic.  Eyes:     General: No scleral icterus. Cardiovascular:     Rate and Rhythm: Normal rate and regular rhythm.     Heart sounds: Normal heart sounds.  Pulmonary:     Effort: Pulmonary effort is normal. No respiratory distress.     Breath sounds: Normal breath  sounds. No wheezing.  Abdominal:     General: Bowel sounds are normal. There is distension.     Palpations: Abdomen is soft.  Musculoskeletal:        General: No deformity. Normal range of motion.     Cervical back: Normal range of motion and neck supple.  Skin:    General: Skin is warm and dry.     Findings: No erythema or rash.  Neurological:     Mental Status: She is alert and oriented to person, place, and time. Mental status is at baseline.     Cranial Nerves: No cranial nerve deficit.     Coordination: Coordination normal.  Psychiatric:        Mood and Affect: Mood normal.    LABORATORY DATA:  I have reviewed the data as listed Lab Results  Component Value Date   WBC 3.9 (L) 05/24/2021   HGB 8.9 (L) 05/24/2021   HCT 26.9 (L) 05/24/2021   MCV 88.8 05/24/2021   PLT 116 (L) 05/24/2021   Recent Labs    04/27/21 0809 05/10/21 0811 05/24/21 0850  NA 136 137 134*  K 3.2* 3.5 3.4*  CL 101 103 100  CO2 '27 27 26  '$ GLUCOSE 136* 110* 104*  BUN '10 9 9  '$ CREATININE 0.86 0.87 0.86  CALCIUM 9.1 9.5 9.1  GFRNONAA >60 >60 >60  PROT 6.0* 6.8 6.9  ALBUMIN 2.9* 3.3* 3.0*  AST 41 42* 35  ALT '21 26 23  '$ ALKPHOS 74 105 126  BILITOT 0.3 0.5 <0.1*    Iron/TIBC/Ferritin/ %Sat No results found for: IRON, TIBC, FERRITIN, IRONPCTSAT    RADIOGRAPHIC STUDIES: I have personally reviewed the radiological images as listed and agreed with the findings in the report. US Paracentesis  Result Date: 05/11/2021 INDICATION: Ascites EXAM: ULTRASOUND GUIDED  PARACENTESIS MEDICATIONS: None. COMPLICATIONS: None immediate. PROCEDURE: Informed written consent was obtained from the patient after a discussion of the risks, benefits and alternatives to treatment. A timeout was performed prior to the initiation of the procedure. Initial ultrasound scanning demonstrates a small to moderate amount of ascites within the right lower abdominal quadrant. The right lower abdomen was prepped and draped in the usual  sterile fashion. 1% lidocaine was used for local anesthesia. Following this, a 19 gauge Yueh catheter was introduced. An  ultrasound image was saved for documentation purposes. The paracentesis was performed. The catheter was removed and a dressing was applied. The patient tolerated the procedure well without immediate post procedural complication. FINDINGS: A total of approximately 1.4 L of clear, straw-colored fluid was removed. IMPRESSION: Successful ultrasound-guided paracentesis yielding 1.4 liters of clear peritoneal fluid. Electronically Signed   By: Albin Felling M.D.   On: 05/11/2021 16:26      ASSESSMENT & PLAN:  1. Malignant neoplasm of right ovary (HCC)   2. Other acute pulmonary embolism without acute cor pulmonale ([place])   3. Encounter for antineoplastic chemotherapy   4. Other cirrhosis of liver ([place])    #Large ovarian cystic mass with malignant ascites, peritoneal nodularity Case was discussed on Gynonc tumor board on 04/25/2021. Consensus reached upon clinical diagnosis of locally advanced Stage II/III Ovarian Cancer, neoadjuvant chemotherapy followed by debulking surgery.  S/p cycle 1 Carboplatin AUC 5 and Taxol '135mg'$ /m2, Cycle 2 Carboplatin AUC 6.  Immediate Grade 2 Taxol infusion reaction. Taxol was not given.  Plan to switch to Carboplatin and Abraxane.  Labs are reviewed and discussed with patient. Hold treatment due to thrombocytopenia.   #Thrombocytopenia, platelet count is 51,000.  No bleeding events. #Bilateral pulmonary embolism, patient has been switched to Lovenox 1 mg/kilogram every 12 hours.  No bleeding events. Advised patient to hold this evening's dose of Lovenox and tomorrow morning's Lovenox dose.  Patient has Mediport procedure tomorrow.  She may resume Lovenox after procedure assuming hemostasis is achieved. Patient also need to have colonoscopy done in early October.  If she tolerates Lovenox, I will continue Lovenox as her anticoagulation option.   She  has establish care with genetic counselor.  Results are pending.  # Anxiety, she is on Cymbalta '30mg'$  daily. She takes  Clonazepam 0.'5mg'$  QHS PRN   # Malignant ascites, paracentesis PRN -slightly more distended compared to last visit.  We will check ultrasound and paracentesis if there is drainable fluid.  #  hypokalemia, increase to potassium 59mq BID   # CA125/CEA ratio is <25, Dr.Secord recommends colonoscopy 3 weeks after cycle 3 treatment, prior to her debulking surgery.  She agrees. I have talked to RN Kristi who will coordinate her care with gastroenterologist.  Planned 3rd cycle of treatment is 06/07/2021 if no delay occurs.  Anticipate colonoscopy to be around 06/21/21 - at that time, she will be anticoagulated about 2.5 months. Ok to hold off Lovenox 12 hours prior to colonoscopy and resume after procedure.   #Radiographic evidence of liver cirrhosis This is a new diagnosis for her. Normal PT, PTT, LFT. Negative hepatitis I will defer work up to GI   Orders Placed This Encounter  Procedures   [place]acentesis    Standing Status:   Future    Standing Expiration Date:   05/31/2022    Order Specific Question:   If therapeutic, is there a maximum amount of fluid to be removed?    Answer:   Yes    Order Specific Question:   What is the maximum amount of fluide to be removed?    Answer:   4L    Order Specific Question:   Are labs required for specimen collection?    Answer:   No    Order Specific Question:   Is Albumin medication needed?    Answer:   No    Order Specific Question:   Reason for Exam (SYMPTOM  OR DIAGNOSIS REQUIRED)    Answer:   ascites  Order Specific Question:   Preferred imaging location?    Answer:   [facility]     All questions were answered. The patient knows to call the clinic with any problems questions or concerns.  cc Adin Hector, MD   Follow up 1 week for lab MD carbo/ abraxane  We spent sufficient time to discuss many aspect of  care, questions were answered to patient's satisfaction.  Earlie Server, MD, PhD Hematology Oncology [place] [facility] 05/31/2021

## 2021-05-31 NOTE — Progress Notes (Signed)
  Perioperative Services Pre-Admission/Anesthesia Testing   Date: 05/31/21 Name: Brittany Montoya MRN:   [id number]  Re: Consideration of preoperative prophylactic antibiotic change   Request sent to: Herbert Pun, MD (routed and/or faxed via [facility])  Planned Surgical Procedure(s):    Case: [id number] Date/Time: 06/01/21 1054   Procedure: INSERTION PORT-A-CATH (Chest)   Anesthesia type: General   Pre-op diagnosis: Malignant neoplasm of rt ovary / malignant ascites   Location: [facility] OR ROOM 04 / [facility] ORS FOR ANESTHESIA GROUP   Surgeons: Herbert Pun, MD     Notes: Patient has a documented allergy to PCN  Advising that PCN has caused her to experience low severity rash in the past.   Screened as appropriate for cephalosporin use during medication reconciliation No immediate angioedema, dysphagia, SOB, anaphylaxis symptoms. No severe rash involving mucous membranes or skin necrosis. No hospital admissions related to side effects of PCN/cephalosporin use.  No documented reaction to PCN or cephalosporin in the last 10 years.  Request:  As an evidence based approach to reducing the rate of incidence for post-operative SSI and the development of MDROs, could an agent with narrower coverage for preoperative prophylaxis in this patient's upcoming surgical course be considered?   Currently ordered preoperative prophylactic ABX: clindamycin.   Specifically requesting change to cephalosporin (CEFAZOLIN).   Please communicate decision with me and I will change the orders in Epic as per your direction.   Things to consider: Many patients report that they were "allergic" to PCN earlier in life, however this does not translate into a true lifelong allergy. Patients can lose sensitivity to specific IgE antibodies over time if PCN is avoided (Kleris & Lugar, 2019).  Up to 10% of the adult population and 15% of hospitalized patients report an allergy to PCN, however clinical  studies suggest that 90% of those reporting an allergy can tolerate PCN antibiotics (Kleris & Lugar, 2019).  Cross-sensitivity between PCN and cephalosporins has been documented as being as high as 10%, however this estimation included data believed to have been collected in a setting where there was contamination. Newer data suggests that the prevalence of cross-sensitivity between PCN and cephalosporins is actually estimated to be closer to 1% (Hermanides et al., 2018).   Patients labeled as PCN allergic, whether they are truly allergic or not, have been found to have inferior outcomes in terms of rates of serious infection, and these patients tend to have longer hospital stays (Ackworth, 2019).  Treatment related secondary infections, such as Clostridioides difficile, have been linked to the improper use of broad spectrum antibiotics in patients improperly labeled as PCN allergic (Kleris & Lugar, 2019).  Anaphylaxis from cephalosporins is rare and the evidence suggests that there is no increased risk of an anaphylactic type reaction when cephalosporins are used in a PCN allergic patient (Pichichero, 2006).  Citations: Hermanides J, Lemkes BA, Prins Pearla Dubonnet MW, Terreehorst I. Presumed ?-Lactam Allergy and Cross-reactivity in the Operating Theater: A Practical Approach. Anesthesiology. 2018 Aug;129(2):335-342. doi: 10.1097/ALN.0000000000002252. PMID: [id number].  Kleris, Cyril., & Lugar, P. L. (2019). Things We Do For No Reason: Failing to Question a Penicillin Allergy History. Journal of hospital medicine, 14(10), [phone]. Advance online publication. https://www.wallace-[place].info/  Pichichero, M. E. (2006). Cephalosporins can be prescribed safely for penicillin-allergic patients. Journal of family medicine, 55(2), 106-112. Accessed: https://cdn.mdedge.com/files/s34f-public/Document/September-2017/5502JFP_AppliedEvidence1.pdf   BHonor Loh MSN, APRN, FNP-C, CEN [facility]  Peri-operative Services Nurse Practitioner FAX: ([phone]/15/22 8:38 AM

## 2021-06-01 ENCOUNTER — Encounter
Admission: RE | Disposition: A | Discharge status: Home / Self Care | Payer: Self-pay | Source: Home / Self Care | Attending: General Surgery

## 2021-06-01 ENCOUNTER — Ambulatory Visit: Payer: BC Managed Care – PPO

## 2021-06-01 ENCOUNTER — Ambulatory Visit: Payer: BC Managed Care – PPO | Admitting: Urgent Care

## 2021-06-01 ENCOUNTER — Ambulatory Visit
Admission: RE | Admit: 2021-06-01 | Discharge: 2021-06-01 | Disposition: A | Discharge status: Home / Self Care | Payer: BC Managed Care – PPO | Source: Ambulatory Visit | Attending: Oncology | Admitting: Oncology

## 2021-06-01 ENCOUNTER — Other Ambulatory Visit: Payer: Self-pay

## 2021-06-01 ENCOUNTER — Telehealth: Payer: Self-pay

## 2021-06-01 ENCOUNTER — Ambulatory Visit
Admission: RE | Admit: 2021-06-01 | Discharge: 2021-06-01 | Disposition: A | Discharge status: Home / Self Care | Payer: BC Managed Care – PPO | Attending: General Surgery | Admitting: General Surgery

## 2021-06-01 ENCOUNTER — Encounter: Payer: Self-pay | Admitting: General Surgery

## 2021-06-01 DIAGNOSIS — I2699 Other pulmonary embolism without acute cor pulmonale: Secondary | ICD-10-CM | POA: Diagnosis not present

## 2021-06-01 DIAGNOSIS — I10 Essential (primary) hypertension: Secondary | ICD-10-CM | POA: Insufficient documentation

## 2021-06-01 DIAGNOSIS — Z803 Family history of malignant neoplasm of breast: Secondary | ICD-10-CM | POA: Insufficient documentation

## 2021-06-01 DIAGNOSIS — Z888 Allergy status to other drugs, medicaments and biological substances status: Secondary | ICD-10-CM | POA: Insufficient documentation

## 2021-06-01 DIAGNOSIS — Z90710 Acquired absence of both cervix and uterus: Secondary | ICD-10-CM | POA: Insufficient documentation

## 2021-06-01 DIAGNOSIS — C561 Malignant neoplasm of right ovary: Secondary | ICD-10-CM | POA: Insufficient documentation

## 2021-06-01 DIAGNOSIS — E7849 Other hyperlipidemia: Secondary | ICD-10-CM | POA: Diagnosis not present

## 2021-06-01 DIAGNOSIS — Z7901 Long term (current) use of anticoagulants: Secondary | ICD-10-CM | POA: Insufficient documentation

## 2021-06-01 DIAGNOSIS — Z79899 Other long term (current) drug therapy: Secondary | ICD-10-CM | POA: Insufficient documentation

## 2021-06-01 DIAGNOSIS — Z95828 Presence of other vascular implants and grafts: Secondary | ICD-10-CM

## 2021-06-01 DIAGNOSIS — K7460 Unspecified cirrhosis of liver: Secondary | ICD-10-CM | POA: Diagnosis not present

## 2021-06-01 DIAGNOSIS — Z880 Allergy status to penicillin: Secondary | ICD-10-CM | POA: Diagnosis not present

## 2021-06-01 DIAGNOSIS — Z8249 Family history of ischemic heart disease and other diseases of the circulatory system: Secondary | ICD-10-CM | POA: Diagnosis not present

## 2021-06-01 DIAGNOSIS — Z9049 Acquired absence of other specified parts of digestive tract: Secondary | ICD-10-CM | POA: Insufficient documentation

## 2021-06-01 DIAGNOSIS — H409 Unspecified glaucoma: Secondary | ICD-10-CM | POA: Insufficient documentation

## 2021-06-01 DIAGNOSIS — G4733 Obstructive sleep apnea (adult) (pediatric): Secondary | ICD-10-CM | POA: Insufficient documentation

## 2021-06-01 DIAGNOSIS — R180 Malignant ascites: Secondary | ICD-10-CM

## 2021-06-01 DIAGNOSIS — Z86010 Personal history of colonic polyps: Secondary | ICD-10-CM | POA: Diagnosis not present

## 2021-06-01 HISTORY — PX: PORTACATH PLACEMENT: SHX2246

## 2021-06-01 LAB — CA 125: Cancer Antigen (CA) 125: 51.2 U/mL — ABNORMAL HIGH (ref 0.0–38.1)

## 2021-06-01 SURGERY — INSERTION, TUNNELED CENTRAL VENOUS DEVICE, WITH PORT
Anesthesia: General | Site: Chest | Laterality: Right

## 2021-06-01 MED ORDER — ONDANSETRON HCL 4 MG/2ML IJ SOLN
INTRAMUSCULAR | Status: AC
  Filled 2021-06-01: qty 2

## 2021-06-01 MED ORDER — PROPOFOL 500 MG/50ML IV EMUL
INTRAVENOUS | Status: DC | PRN
  Administered 2021-06-01: 75 ug/kg/min via INTRAVENOUS

## 2021-06-01 MED ORDER — CEFAZOLIN SODIUM-DEXTROSE 2-4 GM/100ML-% IV SOLN
2.0000 g | Freq: Once | INTRAVENOUS | Status: AC
  Administered 2021-06-01: 2 g via INTRAVENOUS

## 2021-06-01 MED ORDER — CHLORHEXIDINE GLUCONATE 0.12 % MT SOLN
OROMUCOSAL | Status: AC
  Administered 2021-06-01: 15 mL via OROMUCOSAL
  Filled 2021-06-01: qty 15

## 2021-06-01 MED ORDER — LACTATED RINGERS IV SOLN: INTRAVENOUS | Status: DC

## 2021-06-01 MED ORDER — FENTANYL CITRATE (PF) 100 MCG/2ML IJ SOLN
INTRAMUSCULAR | Status: AC
  Filled 2021-06-01: qty 2

## 2021-06-01 MED ORDER — PROPOFOL 10 MG/ML IV BOLUS
INTRAVENOUS | Status: DC | PRN
  Administered 2021-06-01: 20 mg via INTRAVENOUS
  Administered 2021-06-01: 30 mg via INTRAVENOUS
  Administered 2021-06-01 (×3): 20 mg via INTRAVENOUS

## 2021-06-01 MED ORDER — DEXAMETHASONE SODIUM PHOSPHATE 10 MG/ML IJ SOLN
INTRAMUSCULAR | Status: DC | PRN
  Administered 2021-06-01: 10 mg via INTRAVENOUS

## 2021-06-01 MED ORDER — CHLORHEXIDINE GLUCONATE 0.12 % MT SOLN: 15.0000 mL | Freq: Once | OROMUCOSAL | Status: AC

## 2021-06-01 MED ORDER — SCOPOLAMINE 1 MG/3DAYS TD PT72: 1.0000 | MEDICATED_PATCH | Freq: Once | TRANSDERMAL | Status: DC

## 2021-06-01 MED ORDER — APREPITANT 40 MG PO CAPS
ORAL_CAPSULE | ORAL | Status: AC
  Administered 2021-06-01: 40 mg via ORAL
  Filled 2021-06-01: qty 1

## 2021-06-01 MED ORDER — HEPARIN SODIUM (PORCINE) 5000 UNIT/ML IJ SOLN
INTRAMUSCULAR | Status: AC
  Filled 2021-06-01: qty 1

## 2021-06-01 MED ORDER — ORAL CARE MOUTH RINSE: 15.0000 mL | Freq: Once | OROMUCOSAL | Status: AC

## 2021-06-01 MED ORDER — FENTANYL CITRATE (PF) 100 MCG/2ML IJ SOLN
25.0000 ug | INTRAMUSCULAR | Status: DC | PRN
  Administered 2021-06-01: 50 ug via INTRAVENOUS

## 2021-06-01 MED ORDER — CEFAZOLIN SODIUM-DEXTROSE 2-4 GM/100ML-% IV SOLN
INTRAVENOUS | Status: AC
  Filled 2021-06-01: qty 100

## 2021-06-01 MED ORDER — MIDAZOLAM HCL 2 MG/2ML IJ SOLN
INTRAMUSCULAR | Status: DC | PRN
  Administered 2021-06-01: 2 mg via INTRAVENOUS

## 2021-06-01 MED ORDER — FENTANYL CITRATE (PF) 100 MCG/2ML IJ SOLN
INTRAMUSCULAR | Status: DC | PRN
  Administered 2021-06-01: 25 ug via INTRAVENOUS

## 2021-06-01 MED ORDER — APREPITANT 40 MG PO CAPS: 40.0000 mg | ORAL_CAPSULE | Freq: Once | ORAL | Status: AC

## 2021-06-01 MED ORDER — PHENYLEPHRINE HCL (PRESSORS) 10 MG/ML IV SOLN
INTRAVENOUS | Status: DC | PRN
  Administered 2021-06-01: 100 ug via INTRAVENOUS

## 2021-06-01 MED ORDER — ONDANSETRON HCL 4 MG/2ML IJ SOLN
INTRAMUSCULAR | Status: DC | PRN
  Administered 2021-06-01: 4 mg via INTRAVENOUS

## 2021-06-01 MED ORDER — BUPIVACAINE-EPINEPHRINE (PF) 0.25% -1:200000 IJ SOLN
INTRAMUSCULAR | Status: AC
  Filled 2021-06-01: qty 30

## 2021-06-01 MED ORDER — LIDOCAINE HCL (CARDIAC) PF 100 MG/5ML IV SOSY
PREFILLED_SYRINGE | INTRAVENOUS | Status: DC | PRN
  Administered 2021-06-01: 50 mg via INTRAVENOUS

## 2021-06-01 MED ORDER — SODIUM CHLORIDE (PF) 0.9 % IJ SOLN
INTRAMUSCULAR | Status: AC
  Filled 2021-06-01: qty 50

## 2021-06-01 MED ORDER — HYDROCODONE-ACETAMINOPHEN 5-325 MG PO TABS: 1.0000 | ORAL_TABLET | ORAL | 0 refills | Status: AC | PRN

## 2021-06-01 MED ORDER — DEXAMETHASONE SODIUM PHOSPHATE 10 MG/ML IJ SOLN
INTRAMUSCULAR | Status: AC
  Filled 2021-06-01: qty 1

## 2021-06-01 MED ORDER — OXYCODONE HCL 5 MG/5ML PO SOLN: 5.0000 mg | Freq: Once | ORAL | Status: DC | PRN

## 2021-06-01 MED ORDER — MIDAZOLAM HCL 2 MG/2ML IJ SOLN
INTRAMUSCULAR | Status: AC
  Filled 2021-06-01: qty 2

## 2021-06-01 MED ORDER — FAMOTIDINE 20 MG PO TABS: 20.0000 mg | ORAL_TABLET | Freq: Once | ORAL | Status: AC

## 2021-06-01 MED ORDER — FAMOTIDINE 20 MG PO TABS
ORAL_TABLET | ORAL | Status: AC
  Administered 2021-06-01: 20 mg via ORAL
  Filled 2021-06-01: qty 1

## 2021-06-01 MED ORDER — ONDANSETRON HCL 4 MG/2ML IJ SOLN: 4.0000 mg | Freq: Once | INTRAMUSCULAR | Status: DC | PRN

## 2021-06-01 MED ORDER — BUPIVACAINE-EPINEPHRINE (PF) 0.25% -1:200000 IJ SOLN
INTRAMUSCULAR | Status: DC | PRN
  Administered 2021-06-01: 13 mL

## 2021-06-01 MED ORDER — MEPERIDINE HCL 25 MG/ML IJ SOLN: 6.2500 mg | INTRAMUSCULAR | Status: DC | PRN

## 2021-06-01 MED ORDER — SODIUM CHLORIDE 0.9 % IV SOLN
INTRAVENOUS | Status: DC | PRN
  Administered 2021-06-01: 10 mL via INTRAMUSCULAR

## 2021-06-01 MED ORDER — PROPOFOL 10 MG/ML IV BOLUS
INTRAVENOUS | Status: AC
  Filled 2021-06-01: qty 20

## 2021-06-01 MED ORDER — OXYCODONE HCL 5 MG PO TABS: 5.0000 mg | ORAL_TABLET | Freq: Once | ORAL | Status: DC | PRN

## 2021-06-01 MED ORDER — SCOPOLAMINE 1 MG/3DAYS TD PT72
MEDICATED_PATCH | TRANSDERMAL | Status: AC
  Administered 2021-06-01: 1.5 mg via TRANSDERMAL
  Filled 2021-06-01: qty 1

## 2021-06-01 MED ORDER — LIDOCAINE HCL (PF) 2 % IJ SOLN
INTRAMUSCULAR | Status: AC
  Filled 2021-06-01: qty 5

## 2021-06-01 SURGICAL SUPPLY — 36 items
ADH SKN CLS APL DERMABOND .7 (GAUZE/BANDAGES/DRESSINGS) ×1
APL PRP STRL LF DISP 70% ISPRP (MISCELLANEOUS) ×1
BAG DECANTER FOR FLEXI CONT (MISCELLANEOUS) ×2 IMPLANT
BLADE SURG 11 STRL SS SAFETY (MISCELLANEOUS) ×2 IMPLANT
BLADE SURG SZ11 CARB STEEL (BLADE) ×2 IMPLANT
BOOT SUTURE AID YELLOW STND (SUTURE) ×2 IMPLANT
CHLORAPREP W/TINT 26 (MISCELLANEOUS) ×2 IMPLANT
COVER LIGHT HANDLE STERIS (MISCELLANEOUS) ×4 IMPLANT
DERMABOND ADVANCED (GAUZE/BANDAGES/DRESSINGS) ×1
DERMABOND ADVANCED .7 DNX12 (GAUZE/BANDAGES/DRESSINGS) ×1 IMPLANT
DRAPE C-ARM XRAY 36X54 (DRAPES) ×2 IMPLANT
ELECT REM PT RETURN 9FT ADLT (ELECTROSURGICAL) ×2
ELECTRODE REM PT RTRN 9FT ADLT (ELECTROSURGICAL) ×1 IMPLANT
GAUZE 4X4 16PLY ~~LOC~~+RFID DBL (SPONGE) ×2 IMPLANT
GLOVE SURG ENC MOIS LTX SZ6.5 (GLOVE) ×2 IMPLANT
GLOVE SURG UNDER POLY LF SZ6.5 (GLOVE) ×2 IMPLANT
GOWN STRL REUS W/ TWL LRG LVL3 (GOWN DISPOSABLE) ×3 IMPLANT
GOWN STRL REUS W/TWL LRG LVL3 (GOWN DISPOSABLE) ×6
IV NS 500ML (IV SOLUTION) ×2
IV NS 500ML BAXH (IV SOLUTION) ×1 IMPLANT
KIT PORT POWER 8FR ISP CVUE (Port) ×2 IMPLANT
KIT TURNOVER KIT A (KITS) ×2 IMPLANT
LABEL OR SOLS (LABEL) ×2 IMPLANT
MANIFOLD NEPTUNE II (INSTRUMENTS) ×2 IMPLANT
NEEDLE FILTER BLUNT 18X 1/2SAF (NEEDLE) ×1
NEEDLE FILTER BLUNT 18X1 1/2 (NEEDLE) ×1 IMPLANT
PACK PORT-A-CATH (MISCELLANEOUS) ×2 IMPLANT
SUT MNCRL AB 4-0 PS2 18 (SUTURE) ×2 IMPLANT
SUT PROLENE 2 0 FS (SUTURE) ×2 IMPLANT
SUT VIC AB 2-0 SH 27 (SUTURE) ×2
SUT VIC AB 2-0 SH 27XBRD (SUTURE) ×1 IMPLANT
SUT VIC AB 3-0 SH 27 (SUTURE) ×2
SUT VIC AB 3-0 SH 27X BRD (SUTURE) ×1 IMPLANT
SYR 10ML LL (SYRINGE) ×2 IMPLANT
SYR 3ML LL SCALE MARK (SYRINGE) ×2 IMPLANT
WATER STERILE IRR 500ML POUR (IV SOLUTION) ×2 IMPLANT

## 2021-06-01 NOTE — Transfer of Care (Signed)
Immediate Anesthesia Transfer of Care Note  Patient: Brittany Montoya  Procedure(s) Performed: INSERTION PORT-A-CATH (Right: Chest)  Patient Location: PACU  Anesthesia Type:General  Level of Consciousness: awake, alert  and oriented  Airway & Oxygen Therapy: Patient Spontanous Breathing and Patient connected to nasal cannula oxygen  Post-op Assessment: Report given to RN and Post -op Vital signs reviewed and stable  Post vital signs: Reviewed and stable  Last Vitals:  Vitals Value Taken Time  BP 124/64 06/01/21 1239  Temp 36.2 C 06/01/21 1239  Pulse 93 06/01/21 1242  Resp 26 06/01/21 1242  SpO2 98 % 06/01/21 1242  Vitals shown include unvalidated device data.  Last Pain:  Vitals:   06/01/21 1008  TempSrc: Tympanic         Complications: No notable events documented.

## 2021-06-01 NOTE — Discharge Instructions (Addendum)
  Diet: Resume home heart healthy regular diet.   Activity: Increase activity as tolerated. Light activity and walking are encouraged. Do not drive or drink alcohol if taking narcotic pain medications.  Wound care: May shower with soapy water and pat dry (do not rub incisions), but no baths or submerging incision underwater until follow-up. (no swimming)   Medications: Resume all home medications. For mild to moderate pain: acetaminophen (Tylenol) or ibuprofen (if no kidney disease). Combining Tylenol with alcohol can substantially increase your risk of causing liver disease. Narcotic pain medications, if prescribed, can be used for severe pain, though may cause nausea, constipation, and drowsiness. Do not combine Tylenol and Norco within a 6 hour period as Norco contains Tylenol. If you do not need the narcotic pain medication, you do not need to fill the prescription.  Call office ([phone]) at any time if any questions, worsening pain, fevers/chills, bleeding, drainage from incision site, or other concerns.   AMBULATORY SURGERY  DISCHARGE INSTRUCTIONS   The drugs that you were given will stay in your system until tomorrow so for the next 24 hours you should not:  Drive an automobile Make any legal decisions Drink any alcoholic beverage   You may resume regular meals tomorrow.  Today it is better to start with liquids and gradually work up to solid foods.  You may eat anything you prefer, but it is better to start with liquids, then soup and crackers, and gradually work up to solid foods.   Please notify your doctor immediately if you have any unusual bleeding, trouble breathing, redness and pain at the surgery site, drainage, fever, or pain not relieved by medication.    Additional Instructions:        Please contact your physician with any problems or Same Day Surgery at [phone], Monday through Friday 6 am to 4 pm, or [place] [facility] [place] Main number at  [phone]. 

## 2021-06-01 NOTE — Interval H&P Note (Signed)
History and Physical Interval Note:  06/01/2021 10:57 AM  Brittany Montoya  has presented today for surgery, with the diagnosis of Malignant neoplasm of rt ovary / malignant ascites.  The various methods of treatment have been discussed with the patient and family. After consideration of risks, benefits and other options for treatment, the patient has consented to  Procedure(s): INSERTION PORT-A-CATH (N/A) as a surgical intervention.  The patient's history has been reviewed, patient examined, no change in status, stable for surgery.  I have reviewed the patient's chart and labs.  Questions were answered to the patient's satisfaction.     Herbert Pun

## 2021-06-01 NOTE — Op Note (Signed)
SURGICAL PROCEDURE REPORT  DATE OF PROCEDURE: 06/01/2021   SURGEON: Dr. Windell Moment   ANESTHESIA: Local with light IV sedation   PRE-OPERATIVE DIAGNOSIS: Advanced ovarian cancer requiring durable central venous access for chemotherapy   POST-OPERATIVE DIAGNOSIS: Same  PROCEDURE(S): (cpt: Q813696) 1.) Percutaneous access of Right internal jugular vein under ultrasound guidance 2.) Insertion of tunneled Right internal jugular central venous catheter with subcutaneous port  INTRAOPERATIVE FINDINGS: Patent easily compressible Right internal jugular vein with appropriate respiratory variations and well-secured tunneled central venous catheter with subcutaneous port at completion of the procedure  ESTIMATED BLOOD LOSS: Minimal (<20 mL)   SPECIMENS: None   IMPLANTS: 29F tunneled Bard PowerPort central venous catheter with subcutaneous port  DRAINS: None   COMPLICATIONS: None apparent   CONDITION AT COMPLETION: Hemodynamically stable, awake   DISPOSITION: PACU   INDICATION(S) FOR PROCEDURE:  Patient is a 60 y.o. female who presented with advanced ovarian cancer requiring durable central venous access for chemotherapy. All risks, benefits, and alternatives to above elective procedures were discussed with the patient, who elected to proceed, and informed consent was accordingly obtained at that time.  DETAILS OF PROCEDURE:  Patient was brought to the operative suite and appropriately identified. In Trendelenburg position, Right IJ venous access site was prepped and draped in the usual sterile fashion, and following a brief timeout, percutaneous Right IJ venous access was obtained under ultrasound guidance using Seldinger technique, by which local anesthetic was injected over the Right IJ vein, and access needle was inserted under direct ultrasound visualization into the Right IJ vein, through which soft guidewire was advanced, over which access needle was withdrawn. Guidewire was secured,  attention was directed to injection of local anesthetic along the planned tunnel site, 2-3 cm transverse Right chest incision was made and confirmed to accommodate the subcutaneous port, and flushed catheter was tunneled retrograde from the port site over the Right chest to the Right IJ access site with the attached port well-secured to the catheter and within the subcutaneous pocket. Insertion sheath was advanced over the guidewire, which was withdrawn along with the insertion sheath dilator. The catheter was introduced through the sheath and left on the [place]rio [place] junction under fluoro guidance and catheter cut to desire lenght. Catheter connected to port and fixed to the pocket on two side to avoid twisting. Port was confirmed to withdraw blood and flush easily, after which concentrated heparin was instilled into the port and catheter. Dermis at the subcutaneous pocket was re-approximated using buried interrupted 3-0 Vicryl suture, and 4-0 Monocryl suture was used to re-approximate skin at the insertion and subcutaneous port sites in running subcuticular fashion for the subcutaneous port and buried interrupted fashion for the insertion site. Skin was cleaned, dried, and sterile skin glue was applied. Patient was then safely transferred to PACU for a chest x-ray. Ultrasound images are available on paper chart and Fluoroscopy guidance images are available in Epic.

## 2021-06-01 NOTE — Anesthesia Preprocedure Evaluation (Addendum)
Anesthesia Evaluation  Patient identified by MRN, date of birth, ID band Patient awake    Reviewed: Allergy & Precautions, NPO status , Patient's Chart, lab work & pertinent test results, reviewed documented beta blocker date and time   History of Anesthesia Complications (+) PONV  Airway Mallampati: I  TM Distance: >3 FB Neck ROM: Full    Dental no notable dental hx.    Pulmonary    Pulmonary exam normal        Cardiovascular Exercise Tolerance: Good hypertension, Pt. on medications Normal cardiovascular examI     Neuro/Psych    GI/Hepatic GERD  Medicated and Controlled,  Endo/Other    Renal/GU      Musculoskeletal   Abdominal Normal abdominal exam  (+)   Peds  Hematology   Anesthesia Other Findings   Reproductive/Obstetrics                            Anesthesia Physical Anesthesia Plan  ASA: 3  Anesthesia Plan: General   Post-op Pain Management:    Induction: Intravenous  PONV Risk Score and Plan: 4 or greater and Ondansetron, Aprepitant and Metaclopromide  Airway Management Planned: Natural Airway and Simple Face Mask  Additional Equipment: None  Intra-op Plan:   Post-operative Plan: Extubation in OR  Informed Consent: I have reviewed the patients History and Physical, chart, labs and discussed the procedure including the risks, benefits and alternatives for the proposed anesthesia with the patient or authorized representative who has indicated his/her understanding and acceptance.       Plan Discussed with: CRNA  Anesthesia Plan Comments:         Anesthesia Quick Evaluation

## 2021-06-01 NOTE — Telephone Encounter (Signed)
Short term disability eligibility form 7A has been completed and faxed to [facility] total retiremen Plans [phone]) along with requested records. From 703 has also been completed, but will be given back patient so she can retun to employer as requested on Form.    VM left and Mychart message sent to pt to let her know.

## 2021-06-01 NOTE — Anesthesia Postprocedure Evaluation (Signed)
Anesthesia Post Note  Patient: Brittany Montoya  Procedure(s) Performed: INSERTION PORT-A-CATH (Right: Chest)  Patient location during evaluation: PACU Anesthesia Type: General Level of consciousness: awake and alert Pain management: pain level controlled Vital Signs Assessment: post-procedure vital signs reviewed and stable Respiratory status: spontaneous breathing, nonlabored ventilation, respiratory function stable and patient connected to nasal cannula oxygen Cardiovascular status: blood pressure returned to baseline and stable Postop Assessment: no apparent nausea or vomiting Anesthetic complications: no   No notable events documented.   Last Vitals:  Vitals:   06/01/21 1325 06/01/21 1330  BP:  (!) 154/78  Pulse: 89 89  Resp: 18 20  Temp:    SpO2: 95% 97%    Last Pain:  Vitals:   06/01/21 1330  TempSrc:   PainSc: 0-No pain                 Kelin Borum Doyne Keel

## 2021-06-01 NOTE — Procedures (Signed)
PROCEDURE SUMMARY:  Successful US guided paracentesis from right lateral abdomen.  Yielded 4 liters of amber fluid.  No immediate complications.  Patient tolerated well.  EBL = trace  Marshea Wisher S Ledia Hanford PA-C 06/01/2021 4:18 PM

## 2021-06-02 ENCOUNTER — Encounter: Payer: Self-pay | Admitting: General Surgery

## 2021-06-07 ENCOUNTER — Inpatient Hospital Stay (HOSPITAL_BASED_OUTPATIENT_CLINIC_OR_DEPARTMENT_OTHER): Payer: BC Managed Care – PPO | Admitting: Oncology

## 2021-06-07 ENCOUNTER — Inpatient Hospital Stay: Payer: BC Managed Care – PPO

## 2021-06-07 ENCOUNTER — Other Ambulatory Visit: Payer: Self-pay

## 2021-06-07 ENCOUNTER — Encounter: Payer: Self-pay | Admitting: Oncology

## 2021-06-07 ENCOUNTER — Telehealth: Payer: Self-pay

## 2021-06-07 VITALS — BP 118/69 | HR 76

## 2021-06-07 VITALS — BP 121/74 | HR 80 | Temp 98.0°F | Resp 18 | Wt 189.8 lb

## 2021-06-07 DIAGNOSIS — R180 Malignant ascites: Secondary | ICD-10-CM | POA: Diagnosis not present

## 2021-06-07 DIAGNOSIS — I2699 Other pulmonary embolism without acute cor pulmonale: Secondary | ICD-10-CM

## 2021-06-07 DIAGNOSIS — Z803 Family history of malignant neoplasm of breast: Secondary | ICD-10-CM

## 2021-06-07 DIAGNOSIS — C569 Malignant neoplasm of unspecified ovary: Secondary | ICD-10-CM

## 2021-06-07 DIAGNOSIS — C561 Malignant neoplasm of right ovary: Secondary | ICD-10-CM

## 2021-06-07 DIAGNOSIS — Z5111 Encounter for antineoplastic chemotherapy: Secondary | ICD-10-CM | POA: Diagnosis not present

## 2021-06-07 DIAGNOSIS — K7469 Other cirrhosis of liver: Secondary | ICD-10-CM

## 2021-06-07 LAB — CBC WITH DIFFERENTIAL/PLATELET
Abs Immature Granulocytes: 0.07 10*3/uL (ref 0.00–0.07)
Basophils Absolute: 0 10*3/uL (ref 0.0–0.1)
Basophils Relative: 0 %
Eosinophils Absolute: 0 10*3/uL (ref 0.0–0.5)
Eosinophils Relative: 0 %
HCT: 25.1 % — ABNORMAL LOW (ref 36.0–46.0)
Hemoglobin: 8.6 g/dL — ABNORMAL LOW (ref 12.0–15.0)
Immature Granulocytes: 1 %
Lymphocytes Relative: 16 %
Lymphs Abs: 1.2 10*3/uL (ref 0.7–4.0)
MCH: 31 pg (ref 26.0–34.0)
MCHC: 34.3 g/dL (ref 30.0–36.0)
MCV: 90.6 fL (ref 80.0–100.0)
Monocytes Absolute: 0.9 10*3/uL (ref 0.1–1.0)
Monocytes Relative: 13 %
Neutro Abs: 5 10*3/uL (ref 1.7–7.7)
Neutrophils Relative %: 70 %
Platelets: 594 10*3/uL — ABNORMAL HIGH (ref 150–400)
RBC: 2.77 MIL/uL — ABNORMAL LOW (ref 3.87–5.11)
RDW: 20.7 % — ABNORMAL HIGH (ref 11.5–15.5)
WBC: 7.3 10*3/uL (ref 4.0–10.5)
nRBC: 0 % (ref 0.0–0.2)

## 2021-06-07 LAB — COMPREHENSIVE METABOLIC PANEL
ALT: 19 U/L (ref 0–44)
AST: 24 U/L (ref 15–41)
Albumin: 2.9 g/dL — ABNORMAL LOW (ref 3.5–5.0)
Alkaline Phosphatase: 107 U/L (ref 38–126)
Anion gap: 9 (ref 5–15)
BUN: 16 mg/dL (ref 8–23)
CO2: 23 mmol/L (ref 22–32)
Calcium: 9.7 mg/dL (ref 8.9–10.3)
Chloride: 97 mmol/L — ABNORMAL LOW (ref 98–111)
Creatinine, Ser: 0.93 mg/dL (ref 0.44–1.00)
GFR, Estimated: >60 mL/min (ref >60)
Glucose, Bld: 106 mg/dL — ABNORMAL HIGH (ref 70–99)
Potassium: 3.6 mmol/L (ref 3.5–5.1)
Sodium: 129 mmol/L — ABNORMAL LOW (ref 135–145)
Total Bilirubin: 0.4 mg/dL (ref 0.3–1.2)
Total Protein: 6.2 g/dL — ABNORMAL LOW (ref 6.5–8.1)

## 2021-06-07 MED ORDER — LORAZEPAM 2 MG/ML IJ SOLN
0.5000 mg | Freq: Once | INTRAMUSCULAR | Status: AC | PRN
  Administered 2021-06-07: 0.5 mg via INTRAVENOUS
  Filled 2021-06-07: qty 1

## 2021-06-07 MED ORDER — SODIUM CHLORIDE 0.9 % IV SOLN
150.0000 mg | Freq: Once | INTRAVENOUS | Status: AC
  Administered 2021-06-07: 150 mg via INTRAVENOUS
  Filled 2021-06-07: qty 5

## 2021-06-07 MED ORDER — PALONOSETRON HCL INJECTION 0.25 MG/5ML
0.2500 mg | Freq: Once | INTRAVENOUS | Status: AC
  Administered 2021-06-07: 0.25 mg via INTRAVENOUS
  Filled 2021-06-07: qty 5

## 2021-06-07 MED ORDER — SODIUM CHLORIDE 0.9 % IV SOLN
700.0000 mg | Freq: Once | INTRAVENOUS | Status: AC
  Administered 2021-06-07: 700 mg via INTRAVENOUS
  Filled 2021-06-07: qty 70

## 2021-06-07 MED ORDER — SODIUM CHLORIDE 0.9% FLUSH
10.0000 mL | INTRAVENOUS | Status: DC | PRN
Start: 2021-06-07 — End: 2021-06-07
  Administered 2021-06-07: 10 mL
  Filled 2021-06-07: qty 10

## 2021-06-07 MED ORDER — HEPARIN SOD (PORK) LOCK FLUSH 100 UNIT/ML IV SOLN
500.0000 [IU] | Freq: Once | INTRAVENOUS | Status: AC | PRN
  Administered 2021-06-07: 500 [IU]
  Filled 2021-06-07: qty 5

## 2021-06-07 MED ORDER — SODIUM CHLORIDE 0.9 % IV SOLN
Freq: Once | INTRAVENOUS | Status: AC
  Filled 2021-06-07: qty 250

## 2021-06-07 MED ORDER — PACLITAXEL PROTEIN-BOUND CHEMO INJECTION 100 MG
260.0000 mg/m2 | Freq: Once | INTRAVENOUS | Status: AC
  Administered 2021-06-07: 500 mg via INTRAVENOUS
  Filled 2021-06-07: qty 100

## 2021-06-07 MED ORDER — SODIUM CHLORIDE 0.9 % IV SOLN
10.0000 mg | Freq: Once | INTRAVENOUS | Status: AC
  Administered 2021-06-07: 10 mg via INTRAVENOUS
  Filled 2021-06-07: qty 1

## 2021-06-07 NOTE — Progress Notes (Signed)
Hematology/Oncology progress note    Patient Care Team: Adin Hector, MD as PCP - General (Internal Medicine) Earlie Server, MD as Consulting Physician (Hematology and Oncology) Clent Jacks, RN as Oncology Nurse Navigator  REFERRING PROVIDER: Adin Hector, MD  CHIEF COMPLAINTS/REASON FOR VISIT:  Malignant ascites, ovarian neoplasm, pulmonary embolism  HISTORY OF PRESENTING ILLNESS:   Brittany Montoya is a  61 y.o.  female with PMH listed below was seen in consultation at the request of  Adin Hector, MD  for evaluation of complicated ovarian cyst, ascites  Patient has noticed generalized abdominal distention and bloating, nausea and constipation for the past months and  03/23/2021, CT abdomen pelvis with contrast showed large solid and cystic right ovarian mass measuring 14.1 x 18.1 x 16 cm highly suspicious for primary ovarian malignancy.  Evidence of peritoneal spread.  Moderate associated ascites. Slightly small liver with mild nodular contour suggesting a degree of cirrhosis.  04/03/2021, CA125 70.9, Hg 4 400 09.  04/04/2021, was seen by gynecology Dr. Ouida Sills.  And was referred to establish care with GYN oncology. 04/09/2021, patient underwent paracentesis and had 6.3 L of hazy yellow fluid removed.  Cytology is pending.  04/12/2021 patient was referred to see Dr. Theora Gianotti and me.  Patient also has noticed bilateral lower extremity edema, progressively worsening.  Fatigued, shortness of breath with exertion.  Denies any chest pain, cough.  Poor oral intake due to decreased appetite She was accompanied by her husband. Denies any alcohol use or previous hepatitis infection.  She is not aware about cirrhosis. She reports some symptom relief after the paracentesis.  Her abdomen seems to got better and getting worse again.    INTERVAL HISTORY Brittany Montoya is a 60 y.o. female who has above history reviewed by me today presents for follow up visit for  malignant ascites, ovarian neoplasm, pulmonary embolism.  Problems and complaints are listed below: Accompanied by daughter.  She has switched to Lovenox 8mg  SQ q 12 hours.  Patient tolerates well. Status post Mediport placement by Dr. Peyton Najjar. Cycle 3 chemotherapy was held due to thrombocytopenia last week.  No bleeding events. No fever, chills, nausea vomiting.  Shortness of breath has improved. 06/01/2021, patient has US abdomen paracentesis and had 4 L of fluid removed. Abdomen distention has improved.   Review of Systems  Constitutional:  Positive for appetite change and fatigue. Negative for chills and fever.  HENT:   Negative for hearing loss and voice change.   Eyes:  Negative for eye problems.  Respiratory:  Positive for shortness of breath. Negative for chest tightness and cough.   Cardiovascular:  Negative for chest pain.  Gastrointestinal:  Positive for abdominal distention and constipation. Negative for abdominal pain and blood in stool.  Endocrine: Negative for hot flashes.  Genitourinary:  Negative for difficulty urinating and frequency.   Musculoskeletal:  Negative for arthralgias.  Skin:  Negative for itching and rash.  Neurological:  Negative for extremity weakness.  Hematological:  Negative for adenopathy.  Psychiatric/Behavioral:  Negative for confusion. The patient is not nervous/anxious.    MEDICAL HISTORY:  Past Medical History:  Diagnosis Date   Anxiety    Arthritis    Ascites    Cirrhosis of liver (HCC)    DDD (degenerative disc disease), cervical    Family history of breast cancer    GERD (gastroesophageal reflux disease)    Glaucoma    Hypertension    Ovarian cancer ([place]) 04/15/2021  PONV (postoperative nausea and vomiting)    Pulmonary embolism ([place])    Sleep apnea     SURGICAL HISTORY: Past Surgical History:  Procedure Laterality Date   ABDOMINAL HYSTERECTOMY N/A 2008   may have had left ovary removed   [place]     2012, 2015, 2020   PARACENTESIS  04/20/2021   PORTACATH PLACEMENT Right 06/01/2021   Procedure: INSERTION PORT-A-CATH;  Surgeon: Herbert Pun, MD;  Location: [facility] ORS;  Service: General;  Laterality: Right;    SOCIAL HISTORY: Social History   Socioeconomic History   Marital status: Married    Spouse name: Barnabas Lister   Number of children: 2   Years of education: Not on file   Highest education level: Not on file  Occupational History   Not on file  Tobacco Use   Smoking status: Never   Smokeless tobacco: Never  Vaping Use   Vaping Use: Never used  Substance and Sexual Activity   Alcohol use: No   Drug use: No   Sexual activity: Not on file  Other Topics Concern   Not on file  Social History Narrative   Not on file   Social Determinants of Health   Financial Resource Strain: Not on file  Food Insecurity: Not on file  Transportation Needs: Not on file  Physical Activity: Not on file  Stress: Not on file  Social Connections: Not on file  Intimate Partner Violence: Not on file    FAMILY HISTORY: Family History  Problem Relation Age of Onset   Breast cancer Mother 31   Breast cancer Maternal Aunt        d. under 16    ALLERGIES:  is allergic to paclitaxel, amlodipine, and penicillins.  MEDICATIONS:  Current Outpatient Medications  Medication Sig Dispense Refill   acetaminophen (TYLENOL) 500 MG tablet Take 1,000 mg by mouth at bedtime.     albuterol (VENTOLIN HFA) 108 (90 Base) MCG/ACT inhaler Inhale 1-2 puffs into the lungs every 6 (six) hours as needed for wheezing or shortness of breath.     clonazePAM (KLONOPIN) 0.5 MG tablet Take 1 tablet (0.5 mg total) by mouth daily as needed for anxiety. (Patient taking differently: Take 0.5 mg by mouth at bedtime.) 30 tablet 0   dexamethasone (DECADRON) 4 MG tablet Take 2 tablets (8 mg total) by mouth See admin instructions. Take 8mg  daily for 2 days prior to your chemotherapy 30  tablet 0   enoxaparin (LOVENOX) 80 MG/0.8ML injection Inject 0.8 mLs (80 mg total) into the skin every 12 (twelve) hours. 48 mL 1   hydrochlorothiazide (HYDRODIURIL) 12.5 MG tablet Take 12.5 mg by mouth daily.     latanoprost (XALATAN) 0.005 % ophthalmic solution Place 1 drop into both eyes at bedtime.     lidocaine-prilocaine (EMLA) cream Apply to affected area once (Patient taking differently: Apply 1 application topically daily as needed (pain). Apply to affected area once) 30 g 3   losartan (COZAAR) 50 MG tablet Take 50 mg by mouth daily.     Multiple Vitamin (MULTIVITAMIN WITH MINERALS) TABS tablet Take 1 tablet by mouth daily.     ondansetron (ZOFRAN) 8 MG tablet Take 1 tablet (8 mg total) by mouth 2 (two) times daily as needed for refractory nausea / vomiting. Start on day 3 after carboplatin chemo. 30 tablet 1   potassium chloride SA (KLOR-CON) 20 MEQ tablet Take 1 tablet (20 mEq  total) by mouth 2 (two) times daily. 60 tablet 0   triamcinolone cream (KENALOG) 0.1 % Apply 1 application topically daily as needed (irritation).     apixaban (ELIQUIS) 5 MG TABS tablet Take 5 mg by mouth 2 (two) times daily. (Patient not taking: Reported on 06/07/2021)     prochlorperazine (COMPAZINE) 10 MG tablet Take 1 tablet (10 mg total) by mouth every 6 (six) hours as needed (Nausea or vomiting). (Patient not taking: Reported on 06/07/2021) 30 tablet 1   No current facility-administered medications for this visit.   Facility-Administered Medications Ordered in Other Visits  Medication Dose Route Frequency Provider Last Rate Last Admin   LORazepam (ATIVAN) injection 0.5 mg  0.5 mg Intravenous Once PRN Earlie Server, MD         PHYSICAL EXAMINATION: ECOG PERFORMANCE STATUS: 1 - Symptomatic but completely ambulatory Vitals:   06/07/21 0821  BP: 121/74  Pulse: 80  Resp: 18  Temp: 98 F (36.7 C)  SpO2: 100%    There were no vitals filed for this visit.   Physical Exam Constitutional:      General:  She is not in acute distress.    Appearance: She is obese.  HENT:     Head: Normocephalic and atraumatic.  Eyes:     General: No scleral icterus. Cardiovascular:     Rate and Rhythm: Normal rate and regular rhythm.     Heart sounds: Normal heart sounds.  Pulmonary:     Effort: Pulmonary effort is normal. No respiratory distress.     Breath sounds: Normal breath sounds. No wheezing.  Abdominal:     General: Bowel sounds are normal. There is distension.     Palpations: Abdomen is soft.  Musculoskeletal:        General: No deformity. Normal range of motion.     Cervical back: Normal range of motion and neck supple.  Skin:    General: Skin is warm and dry.     Findings: No erythema or rash.  Neurological:     Mental Status: She is alert and oriented to person, place, and time. Mental status is at baseline.     Cranial Nerves: No cranial nerve deficit.     Coordination: Coordination normal.  Psychiatric:        Mood and Affect: Mood normal.    LABORATORY DATA:  I have reviewed the data as listed Lab Results  Component Value Date   WBC 7.3 06/07/2021   HGB 8.6 (L) 06/07/2021   HCT 25.1 (L) 06/07/2021   MCV 90.6 06/07/2021   PLT 594 (H) 06/07/2021   Recent Labs    05/10/21 0811 05/24/21 0850 05/31/21 0811  NA 137 134* 132*  K 3.5 3.4* 3.2*  CL 103 100 99  CO2 27 26 23   GLUCOSE 110* 104* 133*  BUN 9 9 13   CREATININE 0.87 0.86 0.90  CALCIUM 9.5 9.1 9.5  GFRNONAA >60 >60 >60  PROT 6.8 6.9 7.0  ALBUMIN 3.3* 3.0* 3.3*  AST 42* 35 37  ALT 26 23 20   ALKPHOS 105 126 105  BILITOT 0.5 <0.1* 0.4    Iron/TIBC/Ferritin/ %Sat No results found for: IRON, TIBC, FERRITIN, IRONPCTSAT    RADIOGRAPHIC STUDIES: I have personally reviewed the radiological images as listed and agreed with the findings in the report. US Paracentesis  Result Date: 06/01/2021 INDICATION: Recurrent malignant ascites secondary to ovarian cancer. Request for therapeutic paracentesis up to 4 L. EXAM:  ULTRASOUND GUIDED PARACENTESIS MEDICATIONS: 1% lidocaine 10 mL  COMPLICATIONS: None immediate. PROCEDURE: Informed written consent was obtained from the patient after a discussion of the risks, benefits and alternatives to treatment. A timeout was performed prior to the initiation of the procedure. Initial ultrasound scanning demonstrates a moderate amount of ascites within the right lateral abdomen. The right lateral abdomen was prepped and draped in the usual sterile fashion. 1% lidocaine was used for local anesthesia. Following this, a 6 Fr Safe-T-Centesis catheter was introduced. An ultrasound image was saved for documentation purposes. The paracentesis was performed. The catheter was removed and a dressing was applied. The patient tolerated the procedure well without immediate post procedural complication. FINDINGS: A total of approximately 4 L of amber fluid was removed. IMPRESSION: Successful ultrasound-guided paracentesis yielding 4 liters of peritoneal fluid. Read by: Gareth Eagle, PA-C Electronically Signed   By: Michaelle Birks M.D.   On: 06/01/2021 16:19   US Paracentesis  Result Date: 05/11/2021 INDICATION: Ascites EXAM: ULTRASOUND GUIDED  PARACENTESIS MEDICATIONS: None. COMPLICATIONS: None immediate. PROCEDURE: Informed written consent was obtained from the patient after a discussion of the risks, benefits and alternatives to treatment. A timeout was performed prior to the initiation of the procedure. Initial ultrasound scanning demonstrates a small to moderate amount of ascites within the right lower abdominal quadrant. The right lower abdomen was prepped and draped in the usual sterile fashion. 1% lidocaine was used for local anesthesia. Following this, a 19 gauge Yueh catheter was introduced. An ultrasound image was saved for documentation purposes. The paracentesis was performed. The catheter was removed and a dressing was applied. The patient tolerated the procedure well without immediate post  procedural complication. FINDINGS: A total of approximately 1.4 L of clear, straw-colored fluid was removed. IMPRESSION: Successful ultrasound-guided paracentesis yielding 1.4 liters of clear peritoneal fluid. Electronically Signed   By: Albin Felling M.D.   On: 05/11/2021 16:26   DG Chest Port 1 View  Result Date: 06/01/2021 CLINICAL DATA:  Status post Port-A-Cath placement. EXAM: PORTABLE CHEST 1 VIEW COMPARISON:  April 11, 2021. FINDINGS: The heart size and mediastinal contours are within normal limits. Both lungs are clear. Interval placement of right internal jugular Port-A-Cath with distal tip in expected position of the SVC. No pneumothorax is noted. The visualized skeletal structures are unremarkable. IMPRESSION: Interval placement of right internal jugular Port-A-Cath with distal tip in expected position of the SVC. Electronically Signed   By: Marijo Conception M.D.   On: 06/01/2021 13:27   DG C-Arm 1-60 Min-No Report  Result Date: 06/01/2021 Fluoroscopy was utilized by the requesting physician.  No radiographic interpretation.      ASSESSMENT & PLAN:  1. Encounter for antineoplastic chemotherapy   2. Malignant neoplasm of right ovary (HCC)   3. Malignant ascites   4. Other acute pulmonary embolism without acute cor pulmonale (HCC)   5. Other cirrhosis of liver ([place])   6. Family history of breast cancer    #Large ovarian cystic mass with malignant ascites, peritoneal nodularity Case was discussed on Gynonc tumor board on 04/25/2021. Consensus reached upon clinical diagnosis of locally advanced Stage II/III Ovarian Cancer, neoadjuvant chemotherapy followed by debulking surgery.  S/p cycle 1 Carboplatin AUC 5 and Taxol 135mg /m2, Cycle 2 Carboplatin AUC 6.  Immediate Grade 2 Taxol infusion reaction. Taxol was not given.  Labs are reviewed and discussed with patient Proceed with carboplatin AUC 6 and Abraxane today. Repeat CT scan for evaluation of treatment response around  06/25/2021.   #Thrombocytopenia, platelet count normalized. 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  Monitor CBC weekly x2.   #Bilateral pulmonary embolism, patient has been switched to Lovenox 1 mg/kilogram every 12 hours.  Patient also need to have colonoscopy done in early October.  Patient tolerates Lovenox well.  I will continue Lovenox as her anticoagulation option for now.   She has establish care with genetic counselor.  Results are pending.  # Anxiety, she is on Cymbalta 30mg  daily. She takes  Clonazepam 0.5mg  QHS PRN   # Malignant ascites, paracentesis PRN -less distended compared to last visit.    #  hypokalemia, potassium has improved.  Continue.  Potassium 33meq BID   # CA125/CEA ratio is <25, Dr.Secord recommends colonoscopy 3 weeks after cycle 3 treatment, prior to her debulking surgery.  Ok to hold off Lovenox 12 hours prior to colonoscopy and resume after procedure.   #Radiographic evidence of liver cirrhosis Compensated.  Follow-up with gastroenterology.   All questions were answered. The patient knows to call the clinic with any problems questions or concerns.  cc Tama High III, MD   Follow up CT around 06/25/2021.  Plan TBD.  We spent sufficient time to discuss many aspect of care, questions were answered to patient's satisfaction.  Earlie Server, MD, PhD Hematology Oncology [place] at [facility] 06/07/2021

## 2021-06-07 NOTE — Patient Instructions (Signed)
[facility]  Discharge Instructions: Thank you for choosing [facility] to provide your oncology and hematology care.  If you have a lab appointment with the Cancer Center, please go directly to the Cancer Center and check in at the registration area.  Wear comfortable clothing and clothing appropriate for easy access to any Portacath or PICC line.   We strive to give you quality time with your provider. You may need to reschedule your appointment if you arrive late (15 or more minutes).  Arriving late affects you and other patients whose appointments are after yours.  Also, if you miss three or more appointments without notifying the office, you may be dismissed from the clinic at the provider's discretion.      For prescription refill requests, have your pharmacy contact our office and allow 72 hours for refills to be completed.        To help prevent nausea and vomiting after your treatment, we encourage you to take your nausea medication as directed.  BELOW ARE SYMPTOMS THAT SHOULD BE REPORTED IMMEDIATELY: *FEVER GREATER THAN 100.4 F (38 C) OR HIGHER *CHILLS OR SWEATING *NAUSEA AND VOMITING THAT IS NOT CONTROLLED WITH YOUR NAUSEA MEDICATION *UNUSUAL SHORTNESS OF BREATH *UNUSUAL BRUISING OR BLEEDING *URINARY PROBLEMS (pain or burning when urinating, or frequent urination) *BOWEL PROBLEMS (unusual diarrhea, constipation, pain near the anus) TENDERNESS IN MOUTH AND THROAT WITH OR WITHOUT PRESENCE OF ULCERS (sore throat, sores in mouth, or a toothache) UNUSUAL RASH, SWELLING OR PAIN  UNUSUAL VAGINAL DISCHARGE OR ITCHING   Items with * indicate a potential emergency and should be followed up as soon as possible or go to the Emergency Department if any problems should occur.  Please show the CHEMOTHERAPY ALERT CARD or IMMUNOTHERAPY ALERT CARD at check-in to the Emergency Department and triage nurse.  Should you have questions after your visit or  need to cancel or reschedule your appointment, please contact [facility]  [phone] and follow the prompts.  Office hours are 8:00 a.m. to 4:30 p.m. Monday - Friday. Please note that voicemails left after 4:00 p.m. may not be returned until the following business day.  We are closed weekends and major holidays. You have access to a nurse at all times for urgent questions. Please call the main number to the clinic [phone] and follow the prompts.  For any non-urgent questions, you may also contact your provider using MyChart. We now offer e-Visits for anyone 18 and older to request care online for non-urgent symptoms. For details visit [email or web address].   Also download the MyChart app! Go to the app store, search "MyChart", open the app, select [facility], and log in with your MyChart username and password.  Due to Covid, a mask is required upon entering the hospital/clinic. If you do not have a mask, one will be given to you upon arrival. For doctor visits, patients may have 1 support person aged 18 or older with them. For treatment visits, patients cannot have anyone with them due to current Covid guidelines and our immunocompromised population.  

## 2021-06-07 NOTE — Telephone Encounter (Signed)
Per Dr. Tasia Catchings have colonoscopy pushed out for another week due to chemo delay for low platelets. Notified Tommi Rumps, at [facility], and she will speak with Dr. Alice Reichert regarding a new date.

## 2021-06-14 ENCOUNTER — Inpatient Hospital Stay: Payer: BC Managed Care – PPO

## 2021-06-14 ENCOUNTER — Telehealth: Payer: Self-pay

## 2021-06-14 ENCOUNTER — Other Ambulatory Visit: Payer: Self-pay

## 2021-06-14 DIAGNOSIS — Z5111 Encounter for antineoplastic chemotherapy: Secondary | ICD-10-CM | POA: Diagnosis not present

## 2021-06-14 DIAGNOSIS — C561 Malignant neoplasm of right ovary: Secondary | ICD-10-CM

## 2021-06-14 LAB — CBC WITH DIFFERENTIAL/PLATELET
Abs Immature Granulocytes: 0.08 10*3/uL — ABNORMAL HIGH (ref 0.00–0.07)
Basophils Absolute: 0 10*3/uL (ref 0.0–0.1)
Basophils Relative: 1 %
Eosinophils Absolute: 0 10*3/uL (ref 0.0–0.5)
Eosinophils Relative: 1 %
HCT: 21.4 % — ABNORMAL LOW (ref 36.0–46.0)
Hemoglobin: 7.1 g/dL — ABNORMAL LOW (ref 12.0–15.0)
Immature Granulocytes: 5 %
Lymphocytes Relative: 36 %
Lymphs Abs: 0.6 10*3/uL — ABNORMAL LOW (ref 0.7–4.0)
MCH: 30.3 pg (ref 26.0–34.0)
MCHC: 33.2 g/dL (ref 30.0–36.0)
MCV: 91.5 fL (ref 80.0–100.0)
Monocytes Absolute: 0.1 10*3/uL (ref 0.1–1.0)
Monocytes Relative: 8 %
Neutro Abs: 0.8 10*3/uL — ABNORMAL LOW (ref 1.7–7.7)
Neutrophils Relative %: 49 %
Platelets: 359 10*3/uL (ref 150–400)
RBC: 2.34 MIL/uL — ABNORMAL LOW (ref 3.87–5.11)
RDW: 21.2 % — ABNORMAL HIGH (ref 11.5–15.5)
WBC Morphology: ABNORMAL
WBC: 1.6 10*3/uL — ABNORMAL LOW (ref 4.0–10.5)
nRBC: 0 % (ref 0.0–0.2)

## 2021-06-14 NOTE — Telephone Encounter (Signed)
[place] forms have been completed and pt has been called to pick up forms.

## 2021-06-15 ENCOUNTER — Other Ambulatory Visit: Payer: Self-pay | Admitting: Oncology

## 2021-06-15 DIAGNOSIS — T451X5A Adverse effect of antineoplastic and immunosuppressive drugs, initial encounter: Secondary | ICD-10-CM

## 2021-06-15 DIAGNOSIS — D701 Agranulocytosis secondary to cancer chemotherapy: Secondary | ICD-10-CM

## 2021-06-19 ENCOUNTER — Other Ambulatory Visit: Payer: Self-pay

## 2021-06-19 ENCOUNTER — Inpatient Hospital Stay: Payer: BC Managed Care – PPO

## 2021-06-19 ENCOUNTER — Inpatient Hospital Stay: Payer: BC Managed Care – PPO | Attending: Oncology

## 2021-06-19 DIAGNOSIS — Z5189 Encounter for other specified aftercare: Secondary | ICD-10-CM | POA: Diagnosis not present

## 2021-06-19 DIAGNOSIS — D701 Agranulocytosis secondary to cancer chemotherapy: Secondary | ICD-10-CM

## 2021-06-19 DIAGNOSIS — C561 Malignant neoplasm of right ovary: Secondary | ICD-10-CM | POA: Diagnosis not present

## 2021-06-19 LAB — CBC WITH DIFFERENTIAL/PLATELET
Abs Immature Granulocytes: 0 10*3/uL (ref 0.00–0.07)
Basophils Absolute: 0 10*3/uL (ref 0.0–0.1)
Basophils Relative: 0 %
Eosinophils Absolute: 0 10*3/uL (ref 0.0–0.5)
Eosinophils Relative: 1 %
HCT: 22.2 % — ABNORMAL LOW (ref 36.0–46.0)
Hemoglobin: 7.5 g/dL — ABNORMAL LOW (ref 12.0–15.0)
Immature Granulocytes: 0 %
Lymphocytes Relative: 57 %
Lymphs Abs: 0.9 10*3/uL (ref 0.7–4.0)
MCH: 31.3 pg (ref 26.0–34.0)
MCHC: 33.8 g/dL (ref 30.0–36.0)
MCV: 92.5 fL (ref 80.0–100.0)
Monocytes Absolute: 0.5 10*3/uL (ref 0.1–1.0)
Monocytes Relative: 32 %
Neutro Abs: 0.2 10*3/uL — CL (ref 1.7–7.7)
Neutrophils Relative %: 10 %
Platelets: 174 10*3/uL (ref 150–400)
RBC: 2.4 MIL/uL — ABNORMAL LOW (ref 3.87–5.11)
RDW: 21.3 % — ABNORMAL HIGH (ref 11.5–15.5)
WBC: 1.6 10*3/uL — ABNORMAL LOW (ref 4.0–10.5)
nRBC: 0 % (ref 0.0–0.2)

## 2021-06-19 MED ORDER — FILGRASTIM-SNDZ 480 MCG/0.8ML IJ SOSY
480.0000 ug | PREFILLED_SYRINGE | Freq: Once | INTRAMUSCULAR | Status: AC
  Administered 2021-06-19: 480 ug via SUBCUTANEOUS

## 2021-06-20 ENCOUNTER — Inpatient Hospital Stay: Payer: BC Managed Care – PPO

## 2021-06-20 VITALS — BP 143/83 | HR 87 | Temp 97.7°F | Resp 18

## 2021-06-20 DIAGNOSIS — D701 Agranulocytosis secondary to cancer chemotherapy: Secondary | ICD-10-CM

## 2021-06-20 DIAGNOSIS — C561 Malignant neoplasm of right ovary: Secondary | ICD-10-CM | POA: Diagnosis not present

## 2021-06-20 MED ORDER — FILGRASTIM-SNDZ 480 MCG/0.8ML IJ SOSY
480.0000 ug | PREFILLED_SYRINGE | Freq: Once | INTRAMUSCULAR | Status: AC
  Administered 2021-06-20: 480 ug via SUBCUTANEOUS

## 2021-06-21 ENCOUNTER — Inpatient Hospital Stay: Payer: BC Managed Care – PPO

## 2021-06-21 ENCOUNTER — Other Ambulatory Visit: Payer: Self-pay

## 2021-06-21 VITALS — BP 114/79 | HR 90 | Temp 98.0°F | Resp 18

## 2021-06-21 DIAGNOSIS — D701 Agranulocytosis secondary to cancer chemotherapy: Secondary | ICD-10-CM

## 2021-06-21 DIAGNOSIS — C561 Malignant neoplasm of right ovary: Secondary | ICD-10-CM | POA: Diagnosis not present

## 2021-06-21 DIAGNOSIS — C569 Malignant neoplasm of unspecified ovary: Secondary | ICD-10-CM

## 2021-06-21 LAB — CBC WITH DIFFERENTIAL/PLATELET
Abs Immature Granulocytes: 0.3 10*3/uL — ABNORMAL HIGH (ref 0.00–0.07)
Basophils Absolute: 0 10*3/uL (ref 0.0–0.1)
Basophils Relative: 0 %
Eosinophils Absolute: 0.1 10*3/uL (ref 0.0–0.5)
Eosinophils Relative: 0 %
HCT: 22.7 % — ABNORMAL LOW (ref 36.0–46.0)
Hemoglobin: 7.7 g/dL — ABNORMAL LOW (ref 12.0–15.0)
Immature Granulocytes: 2 %
Lymphocytes Relative: 12 %
Lymphs Abs: 1.9 10*3/uL (ref 0.7–4.0)
MCH: 31.7 pg (ref 26.0–34.0)
MCHC: 33.9 g/dL (ref 30.0–36.0)
MCV: 93.4 fL (ref 80.0–100.0)
Monocytes Absolute: 1.2 10*3/uL — ABNORMAL HIGH (ref 0.1–1.0)
Monocytes Relative: 8 %
Neutro Abs: 12 10*3/uL — ABNORMAL HIGH (ref 1.7–7.7)
Neutrophils Relative %: 78 %
Platelets: 88 10*3/uL — ABNORMAL LOW (ref 150–400)
RBC: 2.43 MIL/uL — ABNORMAL LOW (ref 3.87–5.11)
RDW: 22.5 % — ABNORMAL HIGH (ref 11.5–15.5)
WBC Morphology: INCREASED
WBC: 15.5 10*3/uL — ABNORMAL HIGH (ref 4.0–10.5)
nRBC: 0.2 % (ref 0.0–0.2)

## 2021-06-21 MED ORDER — FILGRASTIM-SNDZ 480 MCG/0.8ML IJ SOSY: 480.0000 ug | PREFILLED_SYRINGE | Freq: Once | INTRAMUSCULAR | Status: DC

## 2021-06-21 MED ORDER — SODIUM CHLORIDE 0.9% FLUSH
10.0000 mL | Freq: Once | INTRAVENOUS | Status: AC
  Administered 2021-06-21: 10 mL via INTRAVENOUS
  Filled 2021-06-21: qty 10

## 2021-06-21 MED ORDER — HEPARIN SOD (PORK) LOCK FLUSH 100 UNIT/ML IV SOLN
500.0000 [IU] | Freq: Once | INTRAVENOUS | Status: AC
  Administered 2021-06-21: 500 [IU] via INTRAVENOUS
  Filled 2021-06-21: qty 5

## 2021-06-25 ENCOUNTER — Ambulatory Visit
Admission: RE | Admit: 2021-06-25 | Discharge: 2021-06-25 | Disposition: A | Discharge status: Home / Self Care | Payer: BC Managed Care – PPO | Source: Ambulatory Visit | Attending: Oncology | Admitting: Oncology

## 2021-06-25 ENCOUNTER — Other Ambulatory Visit: Payer: Self-pay

## 2021-06-25 ENCOUNTER — Telehealth: Payer: Self-pay | Admitting: *Deleted

## 2021-06-25 DIAGNOSIS — C561 Malignant neoplasm of right ovary: Secondary | ICD-10-CM | POA: Insufficient documentation

## 2021-06-25 DIAGNOSIS — R180 Malignant ascites: Secondary | ICD-10-CM

## 2021-06-25 MED ORDER — IOHEXOL 350 MG/ML SOLN
100.0000 mL | Freq: Once | INTRAVENOUS | Status: AC | PRN
  Administered 2021-06-25: 100 mL via INTRAVENOUS

## 2021-06-25 NOTE — Telephone Encounter (Signed)
Order entered and request for paracentesis has been faxed to specials scheduling. Will call pt with appt once scheduled.

## 2021-06-25 NOTE — Telephone Encounter (Signed)
Patient scheduled for tomorrow 10/11 @ 2:30. Date per pt preference.

## 2021-06-25 NOTE — Telephone Encounter (Signed)
Patient called and said that Dr Tasia Catchings wanted her to call if she started having fluid build up in her stomach again and she is reporting that the fluid is building up, She states that her stomach is starting to get hard again. She feels it has been building up for the past couple of weeks. Please advise

## 2021-06-26 ENCOUNTER — Ambulatory Visit
Admission: RE | Admit: 2021-06-26 | Discharge: 2021-06-26 | Disposition: A | Discharge status: Home / Self Care | Payer: BC Managed Care – PPO | Source: Ambulatory Visit | Attending: Oncology | Admitting: Oncology

## 2021-06-26 DIAGNOSIS — R180 Malignant ascites: Secondary | ICD-10-CM | POA: Diagnosis not present

## 2021-06-26 NOTE — Procedures (Signed)
PROCEDURE SUMMARY:  Successful US guided paracentesis from RLQ.  Yielded 2.4 L of clear yellow fluid.  No immediate complications.  Pt tolerated well.   Specimen was not sent for labs.  EBL < 7mL  Rockney Ghee 06/26/2021 3:54 PM

## 2021-06-27 ENCOUNTER — Other Ambulatory Visit: Payer: Self-pay | Admitting: Oncology

## 2021-06-27 ENCOUNTER — Inpatient Hospital Stay: Payer: BC Managed Care – PPO | Attending: Oncology | Admitting: Obstetrics and Gynecology

## 2021-06-27 ENCOUNTER — Encounter: Payer: Self-pay | Admitting: Obstetrics and Gynecology

## 2021-06-27 ENCOUNTER — Other Ambulatory Visit: Payer: Self-pay

## 2021-06-27 VITALS — BP 100/78 | HR 97 | Temp 99.6°F | Resp 18 | Wt 180.1 lb

## 2021-06-27 DIAGNOSIS — K5900 Constipation, unspecified: Secondary | ICD-10-CM | POA: Diagnosis not present

## 2021-06-27 DIAGNOSIS — R180 Malignant ascites: Secondary | ICD-10-CM

## 2021-06-27 DIAGNOSIS — R110 Nausea: Secondary | ICD-10-CM | POA: Insufficient documentation

## 2021-06-27 DIAGNOSIS — Z5111 Encounter for antineoplastic chemotherapy: Secondary | ICD-10-CM | POA: Diagnosis present

## 2021-06-27 DIAGNOSIS — C786 Secondary malignant neoplasm of retroperitoneum and peritoneum: Secondary | ICD-10-CM

## 2021-06-27 DIAGNOSIS — E8809 Other disorders of plasma-protein metabolism, not elsewhere classified: Secondary | ICD-10-CM | POA: Insufficient documentation

## 2021-06-27 DIAGNOSIS — I2699 Other pulmonary embolism without acute cor pulmonale: Secondary | ICD-10-CM | POA: Insufficient documentation

## 2021-06-27 DIAGNOSIS — K7460 Unspecified cirrhosis of liver: Secondary | ICD-10-CM | POA: Insufficient documentation

## 2021-06-27 DIAGNOSIS — K766 Portal hypertension: Secondary | ICD-10-CM | POA: Diagnosis not present

## 2021-06-27 DIAGNOSIS — C561 Malignant neoplasm of right ovary: Secondary | ICD-10-CM | POA: Diagnosis not present

## 2021-06-27 DIAGNOSIS — Z7901 Long term (current) use of anticoagulants: Secondary | ICD-10-CM | POA: Insufficient documentation

## 2021-06-27 DIAGNOSIS — G893 Neoplasm related pain (acute) (chronic): Secondary | ICD-10-CM | POA: Diagnosis not present

## 2021-06-27 DIAGNOSIS — Z9221 Personal history of antineoplastic chemotherapy: Secondary | ICD-10-CM | POA: Diagnosis not present

## 2021-06-27 DIAGNOSIS — R140 Abdominal distension (gaseous): Secondary | ICD-10-CM | POA: Diagnosis not present

## 2021-06-27 DIAGNOSIS — Z79899 Other long term (current) drug therapy: Secondary | ICD-10-CM | POA: Diagnosis not present

## 2021-06-27 NOTE — Progress Notes (Signed)
Gynecologic Oncology Interval Visit   Referring Provider: Burman Blacksmith Schermerhorn MD   Chief Concern: ascites and pelvic mass concerning for malignancy  Subjective:  Brittany Montoya is a 60 y.o. female with probable advanced ovarian cancer s/p 3 cycles of neoadjuvant chemotherapy x 3 cyles who returns to clinic for evaluation and consideration of interval debulking.   She saw Dr. Theora Gianotti on 04/11/21 who recommended neoadjuvant on chemotherapy based on performance status of 2-3, frailty, possible cirrhosis. Tumor markers were: CA 125- 62, CEA 4.0. With ratio range of less than 27 raising concern for non-ovarian malignancy.   Progressive SOB prompted CTA- 04/12/21 which was positive for multiple bilateral lobar, segmental and subsegmental pulmonary emboli and upper abdominal ascites. On anticoagulation.   Treatment Summary:  04/19/21- Norma Fredrickson AUC 5- taxol 135 mg/m2 05/10/21- carbo AUC 6 - taxol 150 mg/m2; reaction to taxol- discontinued 06/07/21- carbo AUC 6 - abraxane 260 mg/m2  Case discussed at tumor board on 04/25/21 with recommendation for neoadjuvant chemotherapy then surgery. Tumor testing was recommended.   Genetic Testing: MyRisk +HRD Panel, BRCA1/2 - Negative and HRD negative. Germline testing pending.   She was seen by Dr. 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 MELD score of 6 suggesting well compensated disease.   She has required several paracentesis for malignancy ascites.   CT C/A/P on 06/25/21 Redemonstrated large, mixed solid and cystic mass of the right ovary, which is slightly diminished in size, measuring 18.1 x 12.7 x 12.8 cm, previously 18.1 x 14.1 x 16 cm (series 2, image 83, series 4, image 74). Status post hysterectomy.  1. Redemonstrated large, mixed solid and cystic mass of the right ovary, which is slightly diminished in size. 2. Peritoneal and omental thickening and nodularity is improved compared to prior  examination, although still present. 3. Findings are consistent with treatment response of primary ovarian malignancy and peritoneal and omental metastatic disease. 4. Moderate volume ascites throughout the abdomen and pelvis, similar in volume to prior. 5. No evidence of metastatic disease in the chest  CA 125 has improved to 51.2 (previously 70.9). She does not feel significantly better since starting chemotherapy. Continues to feel tired. Performance status is similar to time of diagnosis. She doe snot make her own meals and spends majority of her time in a chair at home. Due to concern of possible non-gyn malignancy, she has colonoscopy scheduled for 06/29/21. She continues lovenox.     Gynecologic Oncology History:  seen in consultation from Dr. Ouida Sills for generalized abdominal pain with bloating, nausea, and constipation for 1 month.  03/23/2021 CT scan Large solid and cystic right ovarian mass measuring 14.1 x 18.1 x 16 cm highly suspicious for primary ovarian malignancy. 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 Moderate associated ascites. 2. Slightly small liver with mild nodular contour suggesting a degree of cirrhosis.  CA125  70.9  HE4  409  04/04/2021 clinic visit with Dr. Ouida Sills and he recommended consultation with Gyn Onc  She spends >50% of her waking time in a bed or chair. 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     Problem List: Patient Active Problem List   Diagnosis Date Noted   Chemotherapy induced neutropenia ([place]) 06/15/2021   Infusion reaction 05/10/2021   Family history of breast cancer 04/27/2021   Goals of care, counseling/discussion 04/19/2021   Pulmonary embolus ([place]) 04/19/2021   Encounter for antineoplastic  chemotherapy 04/19/2021   Anxiety 04/19/2021   Ovarian cancer ([place]) 04/15/2021   Malignant ascites 04/11/2021   Other cirrhosis of liver ([place]) 04/11/2021   DDD  (degenerative disc disease), cervical 03/02/2019   Glaucoma (increased eye pressure) 03/02/2019   Venous stasis 03/02/2019   Facet arthritis of cervical region 04/01/2016   Incomplete tear of left rotator cuff 01/15/2016   Rotator cuff tendinitis, left 01/15/2016   Essential hypertension 10/02/2015   Obstructive sleep apnea syndrome 10/02/2015   Recurrent major depressive disorder, in full remission ([place]) 10/02/2015   Contusion of left knee 10/17/2014   Past Medical History: See problem list Past Medical History:  Diagnosis Date   Anxiety    Arthritis    Ascites    Cirrhosis of liver (HCC)    DDD (degenerative disc disease), cervical    Family history of breast cancer    GERD (gastroesophageal reflux disease)    Glaucoma    Hypertension    Ovarian cancer ([place]) 04/15/2021   PONV (postoperative nausea and vomiting)    Pulmonary embolism ([place])    Sleep apnea    Past Surgical History: Past Surgical History:  Procedure Laterality Date   ABDOMINAL HYSTERECTOMY N/A 2008   may have had left ovary removed   Conception     2012, 2015, 2020   PARACENTESIS  04/20/2021   PORTACATH PLACEMENT Right 06/01/2021   Procedure: INSERTION PORT-A-CATH;  Surgeon: Herbert Pun, MD;  Location: [facility] ORS;  Service: General;  Laterality: Right;   Past Gynecologic History:  Menarche: 11  OB History: G2P2 OB History  Gravida Para Term Preterm AB Living  '2 2       2  ' SAB IAB Ectopic Multiple Live Births               # Outcome Date GA Lbr Len/2nd Weight Sex Delivery Anes PTL Lv  2 Para           1 Para             Obstetric Comments  C/Section x 2   Family History: Family History  Problem Relation Age of Onset   Breast cancer Mother 43   Breast cancer Maternal Aunt        d. under 78   Social History: Social History   Socioeconomic History   Marital status: Married    Spouse name: Barnabas Lister   Number of children: 2    Years of education: Not on file   Highest education level: Not on file  Occupational History   Not on file  Tobacco Use   Smoking status: Never   Smokeless tobacco: Never  Vaping Use   Vaping Use: Never used  Substance and Sexual Activity   Alcohol use: No   Drug use: No   Sexual activity: Not on file  Other Topics Concern   Not on file  Social History Narrative   Not on file   Social Determinants of Health   Financial Resource Strain: Not on file  Food Insecurity: Not on file  Transportation Needs: Not on file  Physical Activity: Not on file  Stress: Not on file  Social Connections: Not on file  Intimate Partner Violence: Not on file   Allergies: Allergies  Allergen Reactions   Paclitaxel Shortness Of Breath and Other (See Comments)    pt flush, stated she didn't feel well, taxol stopped (05/10/2021)   Amlodipine Other (  See Comments) and Rash    Not effective Not effective    Penicillins Rash   Current Medications: Current Outpatient Medications  Medication Sig Dispense Refill   acetaminophen (TYLENOL) 500 MG tablet Take 1,000 mg by mouth at bedtime.     albuterol (VENTOLIN HFA) 108 (90 Base) MCG/ACT inhaler Inhale 1-2 puffs into the lungs every 6 (six) hours as needed for wheezing or shortness of breath.     dexamethasone (DECADRON) 4 MG tablet Take 2 tablets (8 mg total) by mouth See admin instructions. Take 73m daily for 2 days prior to your chemotherapy 30 tablet 0   enoxaparin (LOVENOX) 80 MG/0.8ML injection Inject 0.8 mLs (80 mg total) into the skin every 12 (twelve) hours. 48 mL 1   hydrochlorothiazide (HYDRODIURIL) 12.5 MG tablet Take 12.5 mg by mouth daily.     latanoprost (XALATAN) 0.005 % ophthalmic solution Place 1 drop into both eyes at bedtime.     lidocaine-prilocaine (EMLA) cream Apply to affected area once (Patient taking differently: Apply 1 application topically daily as needed (pain). Apply to affected area once) 30 g 3   losartan (COZAAR) 50 MG  tablet Take 50 mg by mouth daily.     Multiple Vitamin (MULTIVITAMIN WITH MINERALS) TABS tablet Take 1 tablet by mouth daily.     ondansetron (ZOFRAN) 8 MG tablet Take 1 tablet (8 mg total) by mouth 2 (two) times daily as needed for refractory nausea / vomiting. Start on day 3 after carboplatin chemo. 30 tablet 1   potassium chloride SA (KLOR-CON) 20 MEQ tablet Take 1 tablet (20 mEq total) by mouth 2 (two) times daily. 60 tablet 0   prochlorperazine (COMPAZINE) 10 MG tablet Take 1 tablet (10 mg total) by mouth every 6 (six) hours as needed (Nausea or vomiting). 30 tablet 1   triamcinolone cream (KENALOG) 0.1 % Apply 1 application topically daily as needed (irritation).     clonazePAM (KLONOPIN) 0.5 MG tablet Take 1 tablet (0.5 mg total) by mouth daily as needed for anxiety. (Patient taking differently: Take 0.5 mg by mouth at bedtime.) 30 tablet 0   No current facility-administered medications for this visit.   Facility-Administered Medications Ordered in Other Visits  Medication Dose Route Frequency Provider Last Rate Last Admin   LORazepam (ATIVAN) injection 0.5 mg  0.5 mg Intravenous Once PRN YEarlie Server MD        Review of Systems General:  fatigue & weakness Skin: no complaints Eyes: no complaints HEENT: no complaints Breasts: no complaints Pulmonary: sob with exertion Cardiac: no complaints Gastrointestinal: abdominal fullness Genitourinary/Sexual: no complaints Ob/Gyn: no complaints Musculoskeletal: no complaints Hematology: no complaints Neurologic/Psych: no complaints  Objective:  Physical Examination:  BP 100/78 (BP Location: Right Arm, Patient Position: Sitting, Cuff Size: Normal)   Pulse 97   Temp 99.6 F (37.6 C) (Tympanic)   Resp 18   Wt 180 lb 1.6 oz (81.7 kg)   SpO2 99%   BMI 34.03 kg/m    ECOG Performance Status: 2 - Symptomatic, <50% confined to bed  GENERAL: Frail appearing female. Accompanied by husband HEENT:  Sclera clear. Anicteric NODES:  Negative  axillary, supraclavicular, inguinal lymph node survery LUNGS:  Clear to auscultation bilaterally.  HEART:  Regular rate and rhythm.  ABDOMEN:  Distended abdomen with upper abdominal firmness, mass extends into upper abdomen. Caput medusa has improved. Nontender.  EXTREMITIES:  2+ peripheral edema SKIN:  Clear with no obvious rashes or skin changes.  NEURO:  Nonfocal. Well oriented.  Appropriate affect.  Pelvic: chaperoned by NP EGBUS: no lesions Cervix: surgically absent Vagina: no lesions, no discharge or bleeding. On BME the previously palpated 2 cm nodule at the upper vaginal cuff is no longer present Uterus: surgically absent Adnexa: large mass on the right, difficult to palpate and rising into the upper abdomen. Unable to assess for mobility.  Rectovaginal: confirmatory, no obvious bowel involvement.    Lab Review CBC EXTENDED Latest Ref Rng & Units 06/21/2021 06/19/2021 06/14/2021  WBC 4.0 - 10.5 K/uL 15.5(H) 1.6(L) 1.6(L)  RBC 3.87 - 5.11 MIL/uL 2.43(L) 2.40(L) 2.34(L)  HGB 12.0 - 15.0 g/dL 7.7(L) 7.5(L) 7.1(L)  HCT 36.0 - 46.0 % 22.7(L) 22.2(L) 21.4(L)  PLT 150 - 400 K/uL 88(L) 174 359  NEUTROABS 1.7 - 7.7 K/uL 12.0(H) 0.2(LL) 0.8(L)  LYMPHSABS 0.7 - 4.0 K/uL 1.9 0.9 0.6(L)   CMP Latest Ref Rng & Units 06/07/2021 05/31/2021 05/24/2021  Glucose 70 - 99 mg/dL 106(H) 133(H) 104(H)  BUN 8 - 23 mg/dL '16 13 9  ' Creatinine 0.44 - 1.00 mg/dL 0.93 0.90 0.86  Sodium 135 - 145 mmol/L 129(L) 132(L) 134(L)  Potassium 3.5 - 5.1 mmol/L 3.6 3.2(L) 3.4(L)  Chloride 98 - 111 mmol/L 97(L) 99 100  CO2 22 - 32 mmol/L '23 23 26  ' Calcium 8.9 - 10.3 mg/dL 9.7 9.5 9.1  Total Protein 6.5 - 8.1 g/dL 6.2(L) 7.0 6.9  Total Bilirubin 0.3 - 1.2 mg/dL 0.4 0.4 <0.1(L)  Alkaline Phos 38 - 126 U/L 107 105 126  AST 15 - 41 U/L 24 37 35  ALT 0 - 44 U/L '19 20 23    ' Albumin 2.9  Radiologic Imaging:  03/23/2021   06/25/21- CT C/A/P     Assessment:  Kimbella Heisler is a 60 y.o. female diagnosed with  probable advanced ovarian cancer with symptomatic ascites, now s/p 3 cycles of neoadjuvant carbo-taxol, switched to abraxane for cycle 3 d/t reaction.   Performance status 2-3  Cirrhosis- well compensated, MELD score=6. Followed by GI  Bilateral LE edema  Hx of PE, chronic anticoagulation  Hypoalbuminemia   Medical co-morbidities complicating care: Essential hypertension, Obstructive sleep apnea syndrome, Body mass index is 34.03 kg/m. Prior intra-abdominal surgery CESAREAN SECTION and hysterectomy.   Plan:   Problem List Items Addressed This Visit       Endocrine   Ovarian cancer [facility]) - Primary   Relevant Orders   Ambulatory referral to Physical Therapy   Ambulatory Referral to [facility]    She will have colonoscopy as planned on Friday. Advised her to continue lovenox through colonoscopy. Dr Collie Siad team will reach out to her regarding anticogulation post colonoscopy- eliquis vs continuing lovenox.   She has multiple comorbidities, cirrhosis MELD score =6 is very concerning, deconditioning and hypoalbuminemia. She has responded to therapy, but it is not a robust response and her CA125 remains elevated.   Continue neoadjuvant chemotherapy, optimization of health status with PT consult and dietician. Started her on supplements.    Follow up in 3-4 weeks. If she does have surgery plan for procedure at [facility]. She is high risk for surgical complications.   The patient's diagnosis, an outline of the further diagnostic and laboratory studies which will be required, the recommendation, and alternatives were discussed.  All questions were answered to the patient's satisfaction.  A total of 40 minutes were spent with the patient/family today; >50% was spent in education, counseling and coordination of care for probable advanced ovarian cancer.   Verlon Au, NP  I personally  had a face to face interaction and evaluated the patient jointly with the NP, Ms. Beckey Rutter.  I have  reviewed her history and available records and have performed the key portions of the physical exam including lymph node survey, abdominal exam, pelvic exam with my findings confirming those documented above by the APP.  I have discussed the case with the APP and the patient.  I agree with the above documentation, assessment and plan which was fully formulated by me.  Counseling was completed by me.   I personally saw the patient and performed a substantive portion of this encounter in conjunction with the listed APP as documented above.  Brittany Litaker Gaetana Michaelis, MD

## 2021-06-28 ENCOUNTER — Encounter: Payer: Self-pay | Admitting: Internal Medicine

## 2021-06-28 ENCOUNTER — Encounter: Payer: Self-pay | Admitting: Physical Therapy

## 2021-06-28 ENCOUNTER — Encounter: Payer: Self-pay | Admitting: Oncology

## 2021-06-28 ENCOUNTER — Other Ambulatory Visit: Payer: Self-pay | Admitting: Oncology

## 2021-06-28 ENCOUNTER — Ambulatory Visit: Payer: BC Managed Care – PPO | Attending: Nurse Practitioner | Admitting: Physical Therapy

## 2021-06-28 ENCOUNTER — Telehealth: Payer: Self-pay | Admitting: Licensed Clinical Social Worker

## 2021-06-28 DIAGNOSIS — M6281 Muscle weakness (generalized): Secondary | ICD-10-CM

## 2021-06-28 DIAGNOSIS — C561 Malignant neoplasm of right ovary: Secondary | ICD-10-CM | POA: Diagnosis present

## 2021-06-28 DIAGNOSIS — R2689 Other abnormalities of gait and mobility: Secondary | ICD-10-CM

## 2021-06-28 DIAGNOSIS — M533 Sacrococcygeal disorders, not elsewhere classified: Secondary | ICD-10-CM | POA: Diagnosis not present

## 2021-06-28 DIAGNOSIS — R278 Other lack of coordination: Secondary | ICD-10-CM

## 2021-06-28 MED ORDER — CLONAZEPAM 0.5 MG PO TABS
0.5000 mg | ORAL_TABLET | Freq: Every day | ORAL | 0 refills | Status: DC | PRN
Start: 2021-06-28 — End: 2021-07-27

## 2021-06-28 NOTE — Patient Instructions (Signed)
Walk with 3 min increments every 1 hour ( total of 5 reps) , laps around house  Pace your activities to avoid fatigue   ___   Proper body mechanics with getting out of a chair to decrease strain  on back &pelvic floor   Avoid holding your breath when Getting out of the chair:  Scoot to front part of chair chair Heels behind knees, feet are hip width apart, nose over toes  Inhale like you are smelling roses Exhale to stand    __   Avoid straining pelvic floor, abdominal muscles , spine  Use log rolling technique instead of getting out of bed with your neck or the sit-up     Log rolling into and out of bed   Log rolling into and out of bed If getting out of bed on R side, Bent knees, scoot hips/ shoulder to L  Raise R arm completely overhead, rolling onto armpit  Then lower bent knees to bed to get into complete side lying position  Then drop legs off bed, and push up onto R elbow/forearm, and use L hand to push onto the bed   __  Wear shoe lift in R shoe

## 2021-06-28 NOTE — Therapy (Signed)
[place] MAIN [facility] SERVICES [address] [place], [place], [address] Phone: [phone]   Fax:  [phone]  Physical Therapy Evaluation  Patient Details  Name: Brittany Montoya MRN: [id number] Date of Birth: 1961-05-10 Referring Provider (PT): Verlon Au, NP   Encounter Date: 06/28/2021   PT End of Session - 06/28/21 1612     Visit Number 1    Number of Visits 10    Date for PT Re-Evaluation 09/06/21    PT Start Time 1110    PT Stop Time 1210    PT Time Calculation (min) 60 min    Equipment Utilized During Treatment Gait belt    Activity Tolerance No increased pain;Patient tolerated treatment well    Behavior During Therapy [facility] for tasks assessed/performed             Past Medical History:  Diagnosis Date   Anxiety    Arthritis    Ascites    Cirrhosis of liver (HCC)    DDD (degenerative disc disease), cervical    Depression    Family history of breast cancer    GERD (gastroesophageal reflux disease)    Glaucoma    Headache    migraines   Hypertension    Ovarian cancer ([place]) 04/15/2021   PONV (postoperative nausea and vomiting)    Pulmonary embolism ([place])    Sleep apnea    Venous stasis     Past Surgical History:  Procedure Laterality Date   ABDOMINAL HYSTERECTOMY N/A 2008   may have had left ovary removed   [place]     2012, 2015, 2020   PARACENTESIS  04/20/2021   PILONIDAL CYST / SINUS EXCISION     PORTACATH PLACEMENT Right 06/01/2021   Procedure: INSERTION PORT-A-CATH;  Surgeon: Herbert Pun, MD;  Location: [facility] ORS;  Service: General;  Laterality: Right;    There were no vitals filed for this visit.    Subjective Assessment - 06/28/21 1117     Subjective 1) LBP started for the past 5-6 years suddenly. When pain hits, " it stops her in her tracks".  Pain is dull in the middle of the back and not constant.  Sometimes it  radiates down her LLE. It occurs with bending, getting in/ out car. Pt has had to pull her leg up to put in car, vaccuuming, doing dishes.  Pt is not needing assistance with her household chores.   2) Nocturia : 3-4 x night. Prior to drinking more water with CA Tx and drinks her last amount of water 1/2 bottle of water at 8:30pm-9pm. OSA diagnosed 2006 , discontinued using CPAP because it got too expensive.  Husband states she snores when he sleeps and she feels sleepy during the day.    3) Urinary leakage before making it to the bathroom 40% of the time. Pt doesn not wear any pads/diapers.    4) Constipation: Pt had constipation issues before CA and had BM once every 3-4 days. With Miralax now, pt has BMs 3-4 x time. Daily fluid intake: 64 fl oz of water in bottles.    5) Pelvic pain located in the low abdomen started in June and she had symptoms that worsened. Pt had swelled up with a hard stomach. Fluid have drawn off 4 times because it bothered her breathing and appetite. These Sx worsened and Stage 3  ovarian CA was confirmed with  a CT scan. Pt started  chemo Tx, 2 out of 3. First Tx was in August. Pt is due for her 3rd next Thursday 07/15/21. Pt has been recommende to see a nutritionist and Pelvic PT.  Oncologist has not scheduled a surgery to remove the mass on R ovary yet.  Pain currently in the low abdomen is 4-5 /10. Pain does not affect urination nor bowel movement.  Dr. Tasia Catchings (oncologist) found blood clots in her lungs on her CT scan.    Pertinent History Gynecology Hx: 2 C-sections, abdominal hysterectomy 10+ years ago.    Patient Stated Goals Get stronger and better                [facility] PT Assessment - 06/28/21 1144       Assessment   Medical Diagnosis Ovarian CA    Referring Provider (PT) Verlon Au, NP      Precautions   Precautions None      Restrictions   Weight Bearing Restrictions No      Balance Screen   Has the patient fallen in the past 6 months No       [place] residence    Type of [place] to enter   [place] Two level   sleeps downstairs     Prior Function   Level of Independence Independent      Observation/Other Assessments   Observations slumped siting, ankles crossed    Scoliosis L iliac crest higher, R shoulder higher, dowagers hump, forward ehad      Sit to Stand   Comments 24 sec 5xSTS,  no UE support      AROM   Overall AROM Comments limited L rotation, R sideflexion caused LBP      Strength   Overall Strength Comments R LE 3+/5, L 4-/5      Palpation   SI assessment  supine: levelled iliacc rest, R medial malleoli higher, standing, L iliac crest higher      Ambulation/Gait   Gait Comments preTx: 0.8 m/s, decreased R stance, L trunk lean slightly,, post Tx with shoe lift in R shoe : 0.9 m/s, reciprocal gait pattern, limited armswing still present                        Objective measurements completed on examination: See above findings.       [place] Adult PT Treatment/Exercise - 06/28/21 1144       Therapeutic Activites    Other Therapeutic Activities provided shoe lift in R shoe to adjust for leg length difference      Neuro Re-ed    Neuro Re-ed Details  cued for body mechanics to minimize straining abdomen and pelvic floor and back                          PT Long Term Goals - 06/28/21 1616       PT LONG TERM GOAL #1   Title Pt will demo increased gait speed to > 1.0 m/s  and more reciprocal gait pattern in order to ambulate safely and have  less LBP    Baseline 0.8 m/s, decreased RLE stance phase, slight L trunk lean    Time 8    Period Weeks    Status New    Target Date 08/23/21      PT  LONG TERM GOAL #2   Title Pt will be IND with HEP for strength and conditioning to perform ADLs    Time 8    Period Weeks    Status New    Target Date 09/06/21      PT LONG TERM GOAL #3   Title Pt will  increase FOTO lumbar score from 56 to > 67 pts    Baseline 56pts    Period Weeks    Status New    Target Date 09/06/21      PT LONG TERM GOAL #4   Title Pt will demo proper deep core exercises level 1-2 to improve posture and IAP system to minimize urinary incontinence and pelvic pain    Time 4    Period Weeks    Status New    Target Date 07/26/21      PT LONG TERM GOAL #5   Title Pt will demo levelled iliac crest, shoulders, and less dowagers hump . forward head posture across 2 weeks to progress to deep core HEP    Time 2    Period Weeks    Status New    Target Date 07/12/21      Additional Long Term Goals   Additional Long Term Goals --      PT LONG TERM GOAL #6   Title Pt will demo faster 5 times Sit to stand to demo increased functional strength and minimize risk for falls    Baseline 24 sec 5xSTS,  no UE support    Time 8    Period Weeks    Status New    Target Date 08/23/21      PT LONG TERM GOAL #7   Title PT and pt will communicate with PCP / MD re: pt's discontinued use of CPAP, pt to benefit from updated sleep study and her increased risk for OSA  with nocturia, snoring, and daytime sleepiness    Time 2    Period Weeks    Status New    Target Date 07/12/21                    Plan - 06/28/21 1613     Clinical Impression Statement  Pt is a  60  yo  who presents with Stage III ovarian CA  and c/o of LBP, lpelvic pain, constipation, urinary urge incontinence, and nocturia.  These Sx impact her QOL and ADLs.  Pt's musculoskeletal assessment revealed  uneven iliac crest/ shoulder,    limited spinal /pelvic mobility, LBP with R sideflexion, weak abdominal mm,  dyscoordination and strength of pelvic floor mm, weak hip weakness, RLE weaker than L associated with shorter R LE, slow gait speed and gait deviations, and  poor body mechanics which places strain on the abdominal/pelvic floor mm.   These are deficits that indicate an ineffective intraabdominal  pressure system associated with increased risk for pt's Sx.   Pt was provided education on etiology of Sx with anatomy, physiology explanation with images along with the benefits of customized pelvic PT Tx based on pt's medical conditions and musculoskeletal deficits.  Explained the physiology of deep core mm coordination and roles of pelvic floor function in urination, defecation, sexual function, and postural control with deep core mm system.   Following Tx today which pt tolerated without complaints, pt demo'd equal alignment of pelvic girdle and increased spinal mobility after placement of shoe lift in R shoe. Pt 's gait speed and pattern improved. Pt reported  no LBP with R sideflexion post Tx. Plan to address spinal deviations and improve deep core system at next session. Pt demo'd IND with body mechanics to minimize worsening of abdominal/ pelvic floor straining which will help preserve IAP system function.     Personal Factors and Comorbidities Comorbidity 3+    Comorbidities currently undergoing chemo for Stage III ovarian CA    Examination-Activity Limitations Continence;Toileting    Stability/Clinical Decision Making Evolving/Moderate complexity    Clinical Decision Making Moderate    Rehab Potential Good    PT Frequency 1x / week    PT Duration Other (comment)    PT Treatment/Interventions Neuromuscular re-education;Therapeutic exercise;Therapeutic activities;Functional mobility training;Patient/family education;Manual techniques;Balance training;Moist Heat;Stair training;ADLs/Self Care Home Management;Taping;Scar mobilization    Consulted and Agree with Plan of Care Patient             Patient will benefit from skilled therapeutic intervention in order to improve the following deficits and impairments:  Decreased mobility, Decreased strength, Impaired sensation, Postural dysfunction, Decreased scar mobility, Decreased balance, Decreased activity tolerance, Hypomobility, Improper  body mechanics, Pain, Decreased endurance, Abnormal gait, Decreased coordination, Difficulty walking, Decreased safety awareness, Cardiopulmonary status limiting activity, Increased muscle spasms, Impaired perceived functional ability, Decreased range of motion  Visit Diagnosis: Other abnormalities of gait and mobility  Malignant neoplasm of right ovary (HCC)  Other lack of coordination     Problem List Patient Active Problem List   Diagnosis Date Noted   Chemotherapy induced neutropenia ([place]) 06/15/2021   Infusion reaction 05/10/2021   Family history of breast cancer 04/27/2021   Goals of care, counseling/discussion 04/19/2021   Pulmonary embolus ([place]) 04/19/2021   Encounter for antineoplastic chemotherapy 04/19/2021   Anxiety 04/19/2021   Ovarian cancer ([place]) 04/15/2021   Malignant ascites 04/11/2021   Other cirrhosis of liver ([place]) 04/11/2021   DDD (degenerative disc disease), cervical 03/02/2019   Glaucoma (increased eye pressure) 03/02/2019   Venous stasis 03/02/2019   Facet arthritis of cervical region 04/01/2016   Incomplete tear of left rotator cuff 01/15/2016   Rotator cuff tendinitis, left 01/15/2016   Essential hypertension 10/02/2015   Obstructive sleep apnea syndrome 10/02/2015   Recurrent major depressive disorder, in full remission ([place]) 10/02/2015   Contusion of left knee 10/17/2014    Jerl Mina, PT 06/28/2021, 4:28 PM  [place] [facility] MAIN [facility] SERVICES [address] [place], [place], [address] Phone: [phone]   Fax:  [phone]  Name: Brittany Montoya MRN: [id number] Date of Birth: 1960/12/20

## 2021-06-29 ENCOUNTER — Ambulatory Visit
Admission: RE | Admit: 2021-06-29 | Discharge: 2021-06-29 | Disposition: A | Discharge status: Home / Self Care | Payer: BC Managed Care – PPO | Attending: Internal Medicine | Admitting: Internal Medicine

## 2021-06-29 ENCOUNTER — Ambulatory Visit: Payer: BC Managed Care – PPO | Admitting: Anesthesiology

## 2021-06-29 ENCOUNTER — Encounter
Admission: RE | Disposition: A | Discharge status: Home / Self Care | Payer: Self-pay | Source: Home / Self Care | Attending: Internal Medicine

## 2021-06-29 DIAGNOSIS — Z880 Allergy status to penicillin: Secondary | ICD-10-CM | POA: Diagnosis not present

## 2021-06-29 DIAGNOSIS — K766 Portal hypertension: Secondary | ICD-10-CM | POA: Diagnosis not present

## 2021-06-29 DIAGNOSIS — K5730 Diverticulosis of large intestine without perforation or abscess without bleeding: Secondary | ICD-10-CM | POA: Diagnosis not present

## 2021-06-29 DIAGNOSIS — K2100 Gastro-esophageal reflux disease with esophagitis, without bleeding: Secondary | ICD-10-CM | POA: Diagnosis not present

## 2021-06-29 DIAGNOSIS — Z79899 Other long term (current) drug therapy: Secondary | ICD-10-CM | POA: Diagnosis not present

## 2021-06-29 DIAGNOSIS — R180 Malignant ascites: Secondary | ICD-10-CM | POA: Insufficient documentation

## 2021-06-29 DIAGNOSIS — K635 Polyp of colon: Secondary | ICD-10-CM | POA: Insufficient documentation

## 2021-06-29 DIAGNOSIS — K449 Diaphragmatic hernia without obstruction or gangrene: Secondary | ICD-10-CM | POA: Insufficient documentation

## 2021-06-29 DIAGNOSIS — C569 Malignant neoplasm of unspecified ovary: Secondary | ICD-10-CM | POA: Diagnosis not present

## 2021-06-29 DIAGNOSIS — K222 Esophageal obstruction: Secondary | ICD-10-CM | POA: Diagnosis not present

## 2021-06-29 DIAGNOSIS — Z86711 Personal history of pulmonary embolism: Secondary | ICD-10-CM | POA: Diagnosis not present

## 2021-06-29 DIAGNOSIS — K7460 Unspecified cirrhosis of liver: Secondary | ICD-10-CM | POA: Diagnosis not present

## 2021-06-29 DIAGNOSIS — Z888 Allergy status to other drugs, medicaments and biological substances status: Secondary | ICD-10-CM | POA: Insufficient documentation

## 2021-06-29 DIAGNOSIS — K640 First degree hemorrhoids: Secondary | ICD-10-CM | POA: Diagnosis not present

## 2021-06-29 DIAGNOSIS — K5909 Other constipation: Secondary | ICD-10-CM | POA: Insufficient documentation

## 2021-06-29 HISTORY — DX: Other specified disorders of veins: I87.8

## 2021-06-29 HISTORY — PX: COLONOSCOPY: SHX5424

## 2021-06-29 HISTORY — PX: ESOPHAGOGASTRODUODENOSCOPY: SHX5428

## 2021-06-29 HISTORY — DX: Depression, unspecified: F32.A

## 2021-06-29 HISTORY — DX: Headache, unspecified: R51.9

## 2021-06-29 SURGERY — COLONOSCOPY
Anesthesia: General

## 2021-06-29 MED ORDER — PROPOFOL 10 MG/ML IV BOLUS
INTRAVENOUS | Status: DC | PRN
  Administered 2021-06-29: 125 ug/kg/min via INTRAVENOUS

## 2021-06-29 MED ORDER — PHENYLEPHRINE HCL (PRESSORS) 10 MG/ML IV SOLN
INTRAVENOUS | Status: DC | PRN
  Administered 2021-06-29: 100 ug via INTRAVENOUS

## 2021-06-29 MED ORDER — LIDOCAINE HCL (PF) 1 % IJ SOLN
INTRAMUSCULAR | Status: AC
  Filled 2021-06-29: qty 2

## 2021-06-29 MED ORDER — SODIUM CHLORIDE 0.9 % IV SOLN: INTRAVENOUS | Status: DC

## 2021-06-29 MED ORDER — LIDOCAINE HCL (CARDIAC) PF 100 MG/5ML IV SOSY
PREFILLED_SYRINGE | INTRAVENOUS | Status: DC | PRN
  Administered 2021-06-29: 40 mg via INTRAVENOUS

## 2021-06-29 MED ORDER — HEPARIN SOD (PORK) LOCK FLUSH 100 UNIT/ML IV SOLN
INTRAVENOUS | Status: AC
  Filled 2021-06-29: qty 5

## 2021-06-29 MED ORDER — PROPOFOL 10 MG/ML IV BOLUS
INTRAVENOUS | Status: DC | PRN
  Administered 2021-06-29: 40 mg via INTRAVENOUS
  Administered 2021-06-29: 50 mg via INTRAVENOUS

## 2021-06-29 NOTE — Interval H&P Note (Signed)
History and Physical Interval Note:  06/29/2021 12:08 PM  Brittany Montoya  has presented today for surgery, with the diagnosis of (K76.6) PORTAL VENOUS HYPERTENSION  (R19.4)ENCOUNTER FOR DIAGNOSTIC COLONOSCOPY DUE TO CHANGE IN BOWEL HABITS.  The various methods of treatment have been discussed with the patient and family. After consideration of risks, benefits and other options for treatment, the patient has consented to  Procedure(s) with comments: COLONOSCOPY (N/A) - DOCTORS ARE IN AGREEMENT THAT Luisa Louk CAN DO THIS PROCEDURE IN RUSSO'S BLOCK ESOPHAGOGASTRODUODENOSCOPY (EGD) (N/A) as a surgical intervention.  The patient's history has been reviewed, patient examined, no change in status, stable for surgery.  I have reviewed the patient's chart and labs.  Questions were answered to the patient's satisfaction.     [place], [place]

## 2021-06-29 NOTE — Transfer of Care (Signed)
Immediate Anesthesia Transfer of Care Note  Patient: Brittany Montoya  Procedure(s) Performed: COLONOSCOPY ESOPHAGOGASTRODUODENOSCOPY (EGD)  Patient Location: PACU  Anesthesia Type:General  Level of Consciousness: awake, alert  and oriented  Airway & Oxygen Therapy: Patient Spontanous Breathing and Patient connected to face mask oxygen  Post-op Assessment: Report given to RN and Post -op Vital signs reviewed and stable  Post vital signs: Reviewed and stable  Last Vitals:  Vitals Value Taken Time  BP    Temp    Pulse    Resp    SpO2      Last Pain:  Vitals:   06/29/21 1219  TempSrc: Temporal  PainSc: 0-No pain         Complications: No notable events documented.

## 2021-06-29 NOTE — Anesthesia Preprocedure Evaluation (Addendum)
Anesthesia Evaluation  Patient identified by MRN, date of birth, ID band Patient awake    Reviewed: Allergy & Precautions, NPO status , Patient's Chart, lab work & pertinent test results  History of Anesthesia Complications (+) PONV and history of anesthetic complications  Airway Mallampati: IV   Neck ROM: Full    Dental no notable dental hx.    Pulmonary sleep apnea ,    Pulmonary exam normal breath sounds clear to auscultation       Cardiovascular hypertension, Normal cardiovascular exam Rhythm:Regular Rate:Normal  Hx PE on Eliquis  ECG 04/12/21: SR, PVCs, low voltage  Myocardial perfusion 04/16/19:  1. Normal left ventricular function  2. Normal wall motion  3. No evidence for scar or ischemia  Echo 08/04/19:  NORMAL LEFT VENTRICULAR SYSTOLIC FUNCTION  NORMAL RIGHT VENTRICULAR SYSTOLIC FUNCTION  TRIVIAL REGURGITATION NOTED   NO VALVULAR STENOSIS     Neuro/Psych  Headaches, PSYCHIATRIC DISORDERS Anxiety Depression    GI/Hepatic GERD  ,(+) Cirrhosis       , Portal HTN and malignant ascites   Endo/Other  Obesity   Renal/GU negative Renal ROS     Musculoskeletal  (+) Arthritis ,   Abdominal   Peds  Hematology  (+) Blood dyscrasia (thrombocytopenia), , Ovarian cancer   Anesthesia Other Findings   Reproductive/Obstetrics                            Anesthesia Physical Anesthesia Plan  ASA: 4  Anesthesia Plan: General   Post-op Pain Management:    Induction: Intravenous  PONV Risk Score and Plan: 4 or greater and Propofol infusion, TIVA and Treatment may vary due to age or medical condition  Airway Management Planned: Natural Airway  Additional Equipment:   Intra-op Plan:   Post-operative Plan:   Informed Consent: I have reviewed the patients History and Physical, chart, labs and discussed the procedure including the risks, benefits and alternatives for the  proposed anesthesia with the patient or authorized representative who has indicated his/her understanding and acceptance.       Plan Discussed with: CRNA  Anesthesia Plan Comments:        Anesthesia Quick Evaluation

## 2021-06-29 NOTE — Op Note (Signed)
[facility] Gastroenterology Patient Name: Brittany Montoya Procedure Date: 06/29/2021 12:42 PM MRN: [id number] Account #: [id number] Date of Birth: 1961/02/12 Admit Type: Inpatient Age: 60 Room: [facility] ENDO ROOM 1 Gender: Female Note Status: Finalized Instrument Name: Upper Endoscope [id number] Procedure:             Upper GI endoscopy Indications:           Portal venous hypertension Providers:             Lorie Apley K. Alice Reichert MD, MD Referring MD:          Ramonita Lab, MD (Referring MD) Medicines:             Propofol per Anesthesia Complications:         No immediate complications. Procedure:             Pre-Anesthesia Assessment:                        - The risks and benefits of the procedure and the                         sedation options and risks were discussed with the                         patient. All questions were answered and informed                         consent was obtained.                        - The risks and benefits of the procedure and the                         sedation options and risks were discussed with the                         patient. All questions were answered and informed                         consent was obtained.                        - Patient identification and proposed procedure were                         verified prior to the procedure by the nurse. The                         procedure was verified in the procedure room.                        - ASA Grade Assessment: III - A patient with severe                         systemic disease.                        - After reviewing the risks and benefits, the patient  was deemed in satisfactory condition to undergo the                         procedure.                        After obtaining informed consent, the endoscope was                         passed under direct vision. Throughout the procedure,                         the patient's blood  pressure, pulse, and oxygen                         saturations were monitored continuously. The                         Endosonoscope was introduced through the mouth, and                         advanced to the third part of duodenum. The upper GI                         endoscopy was accomplished without difficulty. The                         patient tolerated the procedure well. Findings:      Mucosal changes including feline appearance and stenosis were found in       the entire esophagus. Biopsies were obtained from the proximal and       distal esophagus with cold forceps for histology of suspected       eosinophilic esophagitis.      One benign-appearing, intrinsic mild stenosis was found at the       gastroesophageal junction. This stenosis measured 1.7 cm (inner       diameter) x less than one cm (in length). The stenosis was traversed.      A 1 cm hiatal hernia was present.      No other significant abnormalities were identified in a careful       examination of the stomach.      The examined duodenum was normal.      The exam was otherwise without abnormality. Impression:            - Esophageal mucosal changes suspicious for                         eosinophilic esophagitis. Biopsied.                        - Benign-appearing esophageal stenosis.                        - 1 cm hiatal hernia.                        - Normal examined duodenum.                        - The examination was otherwise normal. Recommendation:        -  Await pathology results.                        - Proceed with colonoscopy Procedure Code(s):     --- Professional ---                        [phone], Esophagogastroduodenoscopy, flexible,                         transoral; with biopsy, single or multiple Diagnosis Code(s):     --- Professional ---                        K44.9, Diaphragmatic hernia without obstruction or                         gangrene                        K22.2, Esophageal  obstruction                        K22.8, Other specified diseases of esophagus CPT copyright 2019 American Medical Association. All rights reserved. The codes documented in this report are preliminary and upon coder review may  be revised to meet current compliance requirements. Efrain Sella MD, MD 06/29/2021 1:20:09 PM This report has been signed electronically. Number of Addenda: 0 Note Initiated On: 06/29/2021 12:42 PM Estimated Blood Loss:  Estimated blood loss: none.      [facility]

## 2021-06-29 NOTE — H&P (Signed)
Outpatient short stay form Pre-procedure 06/29/2021 11:58 AM Yaquelin Langelier K. Alice Reichert, M.D.  Primary Physician: Ramonita Lab III, M.D.  Reason for visit:  Change in bowel habits, constipation, portal venous hypertension  History of present illness:  60 y/o female with ovarian cancer with peritioneal malignant ascites, and diagnosis of cirrhosis, presents for EGD and colonoscopy for surveillance and to check for metastatic disease from the primary source and to screen for esophageal varices. Patient denies intractable heartburn, dysphagia, hemetemesis, abdominal pain, nausea or vomiting.  Patient has chronic constipation managed with miralax with good control.   No current facility-administered medications for this encounter.  Current Outpatient Medications:    timolol (TIMOPTIC) 0.5 % ophthalmic solution, Place 1 drop into both eyes 2 (two) times daily., Disp: , Rfl:    traMADol (ULTRAM) 50 MG tablet, Take 50 mg by mouth every 6 (six) hours as needed., Disp: , Rfl:    acetaminophen (TYLENOL) 500 MG tablet, Take 1,000 mg by mouth at bedtime., Disp: , Rfl:    albuterol (VENTOLIN HFA) 108 (90 Base) MCG/ACT inhaler, Inhale 1-2 puffs into the lungs every 6 (six) hours as needed for wheezing or shortness of breath., Disp: , Rfl:    clonazePAM (KLONOPIN) 0.5 MG tablet, Take 1 tablet (0.5 mg total) by mouth daily as needed for anxiety., Disp: 30 tablet, Rfl: 0   dexamethasone (DECADRON) 4 MG tablet, Take 2 tablets (8 mg total) by mouth See admin instructions. Take 8mg  daily for 2 days prior to your chemotherapy, Disp: 30 tablet, Rfl: 0   enoxaparin (LOVENOX) 80 MG/0.8ML injection, Inject 0.8 mLs (80 mg total) into the skin every 12 (twelve) hours., Disp: 48 mL, Rfl: 1   hydrochlorothiazide (HYDRODIURIL) 12.5 MG tablet, Take 12.5 mg by mouth daily., Disp: , Rfl:    latanoprost (XALATAN) 0.005 % ophthalmic solution, Place 1 drop into both eyes at bedtime., Disp: , Rfl:    lidocaine-prilocaine (EMLA) cream, Apply  to affected area once (Patient taking differently: Apply 1 application topically daily as needed (pain). Apply to affected area once), Disp: 30 g, Rfl: 3   losartan (COZAAR) 50 MG tablet, Take 50 mg by mouth daily., Disp: , Rfl:    Multiple Vitamin (MULTIVITAMIN WITH MINERALS) TABS tablet, Take 1 tablet by mouth daily., Disp: , Rfl:    ondansetron (ZOFRAN) 8 MG tablet, Take 1 tablet (8 mg total) by mouth 2 (two) times daily as needed for refractory nausea / vomiting. Start on day 3 after carboplatin chemo., Disp: 30 tablet, Rfl: 1   potassium chloride SA (KLOR-CON) 20 MEQ tablet, Take 1 tablet (20 mEq total) by mouth 2 (two) times daily., Disp: 60 tablet, Rfl: 0   prochlorperazine (COMPAZINE) 10 MG tablet, Take 1 tablet (10 mg total) by mouth every 6 (six) hours as needed (Nausea or vomiting)., Disp: 30 tablet, Rfl: 1   triamcinolone cream (KENALOG) 0.1 %, Apply 1 application topically daily as needed (irritation)., Disp: , Rfl:   Facility-Administered Medications Ordered in Other Encounters:    LORazepam (ATIVAN) injection 0.5 mg, 0.5 mg, Intravenous, Once PRN, Earlie Server, MD  No medications prior to admission.     Allergies  Allergen Reactions   Paclitaxel Shortness Of Breath and Other (See Comments)    pt flush, stated she didn't feel well, taxol stopped (05/10/2021)   Amlodipine Other (See Comments) and Rash    Not effective Not effective    Penicillins Rash     Past Medical History:  Diagnosis Date   Anxiety  Arthritis    Ascites    Cirrhosis of liver (HCC)    DDD (degenerative disc disease), cervical    Depression    Family history of breast cancer    GERD (gastroesophageal reflux disease)    Glaucoma    Headache    migraines   Hypertension    Ovarian cancer ([place]) 04/15/2021   PONV (postoperative nausea and vomiting)    Pulmonary embolism (HCC)    Sleep apnea    Venous stasis     Review of systems:  Otherwise negative.    Physical Exam  Gen: Alert, oriented.  Appears stated age.  HEENT: [place]/AT. PERRLA. Lungs: CTA, no wheezes. CV: RR nl S1, S2. Abd: soft, benign, no masses. BS+ Ext: No edema. Pulses 2+    Planned procedures: Proceed with EGD and colonoscopy. The patient understands the nature of the planned procedure, indications, risks, alternatives and potential complications including but not limited to bleeding, infection, perforation, damage to internal organs and possible oversedation/side effects from anesthesia. The patient agrees and gives consent to proceed.  Please refer to procedure notes for findings, recommendations and patient disposition/instructions.     Razi Hickle K. Alice Reichert, M.D. Gastroenterology 06/29/2021  11:58 AM

## 2021-06-29 NOTE — Anesthesia Postprocedure Evaluation (Signed)
Anesthesia Post Note  Patient: Brittany Montoya  Procedure(s) Performed: COLONOSCOPY ESOPHAGOGASTRODUODENOSCOPY (EGD)  Patient location during evaluation: PACU Anesthesia Type: General Level of consciousness: awake and alert, oriented and patient cooperative Pain management: pain level controlled Vital Signs Assessment: post-procedure vital signs reviewed and stable Respiratory status: spontaneous breathing, nonlabored ventilation and respiratory function stable Cardiovascular status: blood pressure returned to baseline and stable Postop Assessment: adequate PO intake Anesthetic complications: no   No notable events documented.   Last Vitals:  Vitals:   06/29/21 1345 06/29/21 1355  BP: (!) 106/53 (!) 98/57  Pulse:    Resp:  (!) 85  Temp:    SpO2: 97% 96%    Last Pain:  Vitals:   06/29/21 1355  TempSrc:   PainSc: 0-No pain                 Darrin Nipper

## 2021-06-29 NOTE — Op Note (Signed)
[facility] Gastroenterology Patient Name: Bre Pecina Procedure Date: 06/29/2021 12:42 PM MRN: [id number] Account #: [id number] Date of Birth: Jul 25, 1961 Admit Type: Inpatient Age: 60 Room: [facility] ENDO ROOM 1 Gender: Female Note Status: Finalized Instrument Name: Jasper Riling [id number] Procedure:             Colonoscopy Indications:           Change in bowel habits Providers:             Benay Pike. Alice Reichert MD, MD Referring MD:          Ramonita Lab, MD (Referring MD) Medicines:             Propofol per Anesthesia Complications:         No immediate complications. Procedure:             Pre-Anesthesia Assessment:                        - The risks and benefits of the procedure and the                         sedation options and risks were discussed with the                         patient. All questions were answered and informed                         consent was obtained.                        - Patient identification and proposed procedure were                         verified prior to the procedure by the nurse. The                         procedure was verified in the procedure room.                        - ASA Grade Assessment: III - A patient with severe                         systemic disease.                        - After reviewing the risks and benefits, the patient                         was deemed in satisfactory condition to undergo the                         procedure.                        After obtaining informed consent, the colonoscope was                         passed under direct vision. Throughout the procedure,                         the patient's  blood pressure, pulse, and oxygen                         saturations were monitored continuously. The                         Colonoscope was introduced through the anus and                         advanced to the the cecum, identified by appendiceal                         orifice and  ileocecal valve. The colonoscopy was                         performed without difficulty. The patient tolerated                         the procedure well. The quality of the bowel                         preparation was good. The ileocecal valve, appendiceal                         orifice, and rectum were photographed. Findings:      The perianal and digital rectal examinations were normal. Pertinent       negatives include normal sphincter tone and no palpable rectal lesions.      Non-bleeding internal hemorrhoids were found during retroflexion. The       hemorrhoids were Grade I (internal hemorrhoids that do not prolapse).      Many small-mouthed diverticula were found in the entire colon. There was       no evidence of diverticular bleeding.      A 4 mm polyp was found in the distal transverse colon. The polyp was       sessile. The polyp was removed with a cold biopsy forceps. Resection and       retrieval were complete.      The exam was otherwise without abnormality. Impression:            - Non-bleeding internal hemorrhoids.                        - Mild diverticulosis in the entire examined colon.                         There was no evidence of diverticular bleeding.                        - One 4 mm polyp in the distal transverse colon,                         removed with a cold biopsy forceps. Resected and                         retrieved.                        - The examination was otherwise normal. Recommendation:        -  Await pathology results from EGD, also performed                         today.                        - Patient has a contact number available for                         emergencies. The signs and symptoms of potential                         delayed complications were discussed with the patient.                         Return to normal activities tomorrow. Written                         discharge instructions were provided to the patient.                         - Resume previous diet.                        - Continue present medications.                        - Repeat colonoscopy is recommended for surveillance.                         The colonoscopy date will be determined after                         pathology results from today's exam become available                         for review.                        - Return to my office in 6 months.                        - The findings and recommendations were discussed with                         the patient. Procedure Code(s):     --- Professional ---                        [phone], Colonoscopy, flexible; with biopsy, single or                         multiple Diagnosis Code(s):     --- Professional ---                        K57.30, Diverticulosis of large intestine without                         perforation or abscess without bleeding  R19.4, Change in bowel habit                        K63.5, Polyp of colon                        K64.0, First degree hemorrhoids CPT copyright 2019 American Medical Association. All rights reserved. The codes documented in this report are preliminary and upon coder review may  be revised to meet current compliance requirements. Efrain Sella MD, MD 06/29/2021 1:32:58 PM This report has been signed electronically. Number of Addenda: 0 Note Initiated On: 06/29/2021 12:42 PM Scope Withdrawal Time: 0 hours 6 minutes 35 seconds  Total Procedure Duration: 0 hours 8 minutes 41 seconds  Estimated Blood Loss:  Estimated blood loss: none.      [facility]

## 2021-07-02 ENCOUNTER — Encounter: Payer: Self-pay | Admitting: Internal Medicine

## 2021-07-02 ENCOUNTER — Telehealth: Payer: Self-pay

## 2021-07-02 DIAGNOSIS — C561 Malignant neoplasm of right ovary: Secondary | ICD-10-CM

## 2021-07-02 LAB — SURGICAL PATHOLOGY

## 2021-07-02 NOTE — Telephone Encounter (Signed)
-----   Message from Earlie Server, MD sent at 07/01/2021 12:14 AM EDT ----- Please add hold tube and possible blood to her next chemotherapy visit.

## 2021-07-02 NOTE — Telephone Encounter (Signed)
Please add possible blood to next chemo tx...ahs

## 2021-07-03 ENCOUNTER — Ambulatory Visit: Payer: BC Managed Care – PPO | Admitting: Physical Therapy

## 2021-07-03 ENCOUNTER — Other Ambulatory Visit: Payer: Self-pay

## 2021-07-03 DIAGNOSIS — M6281 Muscle weakness (generalized): Secondary | ICD-10-CM

## 2021-07-03 DIAGNOSIS — C561 Malignant neoplasm of right ovary: Secondary | ICD-10-CM

## 2021-07-03 DIAGNOSIS — R2689 Other abnormalities of gait and mobility: Secondary | ICD-10-CM

## 2021-07-03 DIAGNOSIS — R278 Other lack of coordination: Secondary | ICD-10-CM

## 2021-07-03 NOTE — Patient Instructions (Signed)
  Clam Shell 45 Degrees  Lying with hips and knees bent 45, one pillow between knees and ankles. Heel together, toes apart like ballerina,  Lift knee with exhale while pressing heels together. Be sure pelvis does not roll backward. Do not arch back. Do 20 times, each leg, 2 times per day.     Complimentary stretch: Figure-4  Turning body at 45 deg on bed, knees bent, leaning over knee Feel stretch at glut   5 breaths   __  Walking 2 min -4  x day  Before breakfast,  30-45 min after lunch/ dinner , before bed

## 2021-07-03 NOTE — Therapy (Signed)
[place] [facility] SERVICES [address] [place], [place], [address] Phone: [phone]   Fax:  [phone]  Physical Therapy Treatment  Patient Details  Name: Brittany Montoya MRN: [id number] Date of Birth: 1961-01-22 Referring Provider (PT): Verlon Au, NP   Encounter Date: 07/03/2021   PT End of Session - 07/03/21 1744     Visit Number 2    Number of Visits 10    Date for PT Re-Evaluation 09/06/21    PT Start Time 8768    PT Stop Time 1157    PT Time Calculation (min) 49 min    Equipment Utilized During Treatment Gait belt    Activity Tolerance No increased pain;Patient tolerated treatment well    Behavior During Therapy [facility] for tasks assessed/performed             Past Medical History:  Diagnosis Date   Anxiety    Arthritis    Ascites    Cirrhosis of liver ([place])    DDD (degenerative disc disease), cervical    Depression    Family history of breast cancer    GERD (gastroesophageal reflux disease)    Glaucoma    Headache    migraines   Hypertension    Ovarian cancer ([place]) 04/15/2021   PONV (postoperative nausea and vomiting)    Pulmonary embolism ([place])    Sleep apnea    Venous stasis     Past Surgical History:  Procedure Laterality Date   ABDOMINAL HYSTERECTOMY N/A 2008   may have had left ovary removed   [place]     2012, 2015, 2020   COLONOSCOPY N/A 06/29/2021   Procedure: COLONOSCOPY;  Surgeon: Toledo, Benay Pike, MD;  Location: [facility] ENDOSCOPY;  Service: Gastroenterology;  Laterality: N/A;  DOCTORS ARE IN AGREEMENT THAT TOLEDO CAN DO THIS PROCEDURE IN RUSSO'S BLOCK   ESOPHAGOGASTRODUODENOSCOPY N/A 06/29/2021   Procedure: ESOPHAGOGASTRODUODENOSCOPY (EGD);  Surgeon: Toledo, Benay Pike, MD;  Location: [facility] ENDOSCOPY;  Service: Gastroenterology;  Laterality: N/A;   PARACENTESIS  04/20/2021   PILONIDAL CYST / SINUS EXCISION     PORTACATH  PLACEMENT Right 06/01/2021   Procedure: INSERTION PORT-A-CATH;  Surgeon: Herbert Pun, MD;  Location: [facility] ORS;  Service: General;  Laterality: Right;    There were no vitals filed for this visit.   Subjective Assessment - 07/03/21 1746     Subjective Pt reported shoe lift has been helping with her walking.                [facility] PT Assessment - 07/03/21 1717       Ambulation/Gait   Gait Comments 1.11 m/s, reciporcal      6 minute walk test results    Aerobic Endurance Distance Walked --   2' : 340 ft , seated 2' rest break, 2: 360'   Endurance additional comments 1 break after c/o "winded" at 2 min into 6 min               Pre walk  97 Sat O2 127 bpm   After 340 ft  ( 2 min)  c/o "windedness" and had a seated rest break for 2' 98% Sat O2  HR 136 bpm BORG 5/10  Total walked today: 700 ft  After second bout of 85min: 97% Sat O2  133 bpm               [facility]  Adult PT Treatment/Exercise - 07/03/21 1717       Therapeutic Activites    Other Therapeutic Activities explained about endurance and walking 2' with seated breakn based on her walking endurance today      Neuro Re-ed    Neuro Re-ed Details  cued for clamshells                          PT Long Term Goals - 06/28/21 1616       PT LONG TERM GOAL #1   Title Pt will demo increased gait speed to > 1.0 m/s  and more reciprocal gait pattern in order to ambulate safely and have  less LBP    Baseline 0.8 m/s, decreased RLE stance phase, slight L trunk lean    Time 8    Period Weeks    Status New    Target Date 08/23/21      PT LONG TERM GOAL #2   Title Pt will be IND with HEP for strength and conditioning to perform ADLs    Time 8    Period Weeks    Status New    Target Date 09/06/21      PT LONG TERM GOAL #3   Title Pt will increase FOTO lumbar score from 56 to > 67 pts    Baseline 56pts    Period Weeks    Status New    Target Date 09/06/21      PT LONG  TERM GOAL #4   Title Pt will demo proper deep core exercises level 1-2 to improve posture and IAP system to minimize urinary incontinence and pelvic pain    Time 4    Period Weeks    Status New    Target Date 07/26/21      PT LONG TERM GOAL #5   Title Pt will demo levelled iliac crest, shoulders, and less dowagers hump . forward head posture across 2 weeks to progress to deep core HEP    Time 2    Period Weeks    Status New    Target Date 07/12/21      Additional Long Term Goals   Additional Long Term Goals --      PT LONG TERM GOAL #6   Title Pt will demo faster 5 times Sit to stand to demo increased functional strength and minimize risk for falls    Baseline 24 sec 5xSTS,  no UE support    Time 8    Period Weeks    Status New    Target Date 08/23/21      PT LONG TERM GOAL #7   Title PT and pt will communicate with PCP / MD re: pt's discontinued use of CPAP, pt to benefit from updated sleep study and her increased risk for OSA  with nocturia, snoring, and daytime sleepiness    Time 2    Period Weeks    Status New    Target Date 07/12/21                   Plan - 07/03/21 1744     Clinical Impression Statement Pt has no complaints with wearing shoe lift and has reported it has helped her. Pt demo'd improved gait pattern which more reciprocal and with less trunk lean.   Completed walking test and pt showed low endurance as pt required seated rest break due to experiencing "windedness" at 2 min (340 ft). Pt was  able to walk 360 ft for another 2 min after a seated 2 min rest break.   Advised pt to walk 2 min increments across 4 different times in the day with education about activity pacing.   Added clam shell strengthening into HEP and reviewed logrolling technique to minimize straining pelvic floor and back. T required minor cues. \  Plan to add deep core at next session. Pt continues to benefit from skilled PT      Personal Factors and Comorbidities Comorbidity  3+    Comorbidities currently undergoing chemo for Stage III ovarian CA    Examination-Activity Limitations Continence;Toileting    Stability/Clinical Decision Making Evolving/Moderate complexity    Rehab Potential Good    PT Frequency 1x / week    PT Duration Other (comment)    PT Treatment/Interventions Neuromuscular re-education;Therapeutic exercise;Therapeutic activities;Functional mobility training;Patient/family education;Manual techniques;Balance training;Moist Heat;Stair training;ADLs/Self Care Home Management;Taping;Scar mobilization    Consulted and Agree with Plan of Care Patient             Patient will benefit from skilled therapeutic intervention in order to improve the following deficits and impairments:  Decreased mobility, Decreased strength, Impaired sensation, Postural dysfunction, Decreased scar mobility, Decreased balance, Decreased activity tolerance, Hypomobility, Improper body mechanics, Pain, Decreased endurance, Abnormal gait, Decreased coordination, Difficulty walking, Decreased safety awareness, Cardiopulmonary status limiting activity, Increased muscle spasms, Impaired perceived functional ability, Decreased range of motion  Visit Diagnosis: Muscle weakness (generalized)  Malignant neoplasm of right ovary (HCC)  Other abnormalities of gait and mobility  Other lack of coordination     Problem List Patient Active Problem List   Diagnosis Date Noted   Chemotherapy induced neutropenia ([place]) 06/15/2021   Infusion reaction 05/10/2021   Family history of breast cancer 04/27/2021   Goals of care, counseling/discussion 04/19/2021   Pulmonary embolus ([place]) 04/19/2021   Encounter for antineoplastic chemotherapy 04/19/2021   Anxiety 04/19/2021   Ovarian cancer ([place]) 04/15/2021   Malignant ascites 04/11/2021   Other cirrhosis of liver ([place]) 04/11/2021   DDD (degenerative disc disease), cervical 03/02/2019   Glaucoma (increased eye pressure) 03/02/2019    Venous stasis 03/02/2019   Facet arthritis of cervical region 04/01/2016   Incomplete tear of left rotator cuff 01/15/2016   Rotator cuff tendinitis, left 01/15/2016   Essential hypertension 10/02/2015   Obstructive sleep apnea syndrome 10/02/2015   Recurrent major depressive disorder, in full remission ([place]) 10/02/2015   Contusion of left knee 10/17/2014    Jerl Mina, PT 07/03/2021, 6:11 PM  [place] [address] [place], [place], [address] Phone: [phone]   Fax:  [phone]  Name: Brittany Montoya MRN: [id number] Date of Birth: August 18, 1961

## 2021-07-04 ENCOUNTER — Ambulatory Visit: Payer: BC Managed Care – PPO

## 2021-07-05 ENCOUNTER — Other Ambulatory Visit: Payer: Self-pay | Admitting: Internal Medicine

## 2021-07-05 ENCOUNTER — Other Ambulatory Visit: Payer: Self-pay

## 2021-07-05 ENCOUNTER — Inpatient Hospital Stay: Payer: BC Managed Care – PPO

## 2021-07-05 ENCOUNTER — Encounter: Payer: Self-pay | Admitting: Oncology

## 2021-07-05 ENCOUNTER — Inpatient Hospital Stay (HOSPITAL_BASED_OUTPATIENT_CLINIC_OR_DEPARTMENT_OTHER): Payer: BC Managed Care – PPO | Admitting: Oncology

## 2021-07-05 ENCOUNTER — Other Ambulatory Visit: Payer: BC Managed Care – PPO

## 2021-07-05 ENCOUNTER — Other Ambulatory Visit: Payer: Self-pay | Admitting: Oncology

## 2021-07-05 ENCOUNTER — Ambulatory Visit: Payer: BC Managed Care – PPO | Admitting: Oncology

## 2021-07-05 ENCOUNTER — Ambulatory Visit: Payer: BC Managed Care – PPO

## 2021-07-05 VITALS — BP 123/85 | HR 90 | Temp 97.7°F | Wt 184.8 lb

## 2021-07-05 DIAGNOSIS — D649 Anemia, unspecified: Secondary | ICD-10-CM

## 2021-07-05 DIAGNOSIS — C569 Malignant neoplasm of unspecified ovary: Secondary | ICD-10-CM

## 2021-07-05 DIAGNOSIS — T451X5A Adverse effect of antineoplastic and immunosuppressive drugs, initial encounter: Secondary | ICD-10-CM

## 2021-07-05 DIAGNOSIS — R180 Malignant ascites: Secondary | ICD-10-CM

## 2021-07-05 DIAGNOSIS — C561 Malignant neoplasm of right ovary: Secondary | ICD-10-CM

## 2021-07-05 DIAGNOSIS — D701 Agranulocytosis secondary to cancer chemotherapy: Secondary | ICD-10-CM | POA: Diagnosis not present

## 2021-07-05 DIAGNOSIS — Z95828 Presence of other vascular implants and grafts: Secondary | ICD-10-CM

## 2021-07-05 DIAGNOSIS — I2699 Other pulmonary embolism without acute cor pulmonale: Secondary | ICD-10-CM

## 2021-07-05 DIAGNOSIS — D539 Nutritional anemia, unspecified: Secondary | ICD-10-CM | POA: Insufficient documentation

## 2021-07-05 DIAGNOSIS — Z5111 Encounter for antineoplastic chemotherapy: Secondary | ICD-10-CM

## 2021-07-05 LAB — CBC WITH DIFFERENTIAL/PLATELET
Abs Immature Granulocytes: 0.09 10*3/uL — ABNORMAL HIGH (ref 0.00–0.07)
Basophils Absolute: 0 10*3/uL (ref 0.0–0.1)
Basophils Relative: 0 %
Eosinophils Absolute: 0 10*3/uL (ref 0.0–0.5)
Eosinophils Relative: 0 %
HCT: 24.6 % — ABNORMAL LOW (ref 36.0–46.0)
Hemoglobin: 8.1 g/dL — ABNORMAL LOW (ref 12.0–15.0)
Immature Granulocytes: 1 %
Lymphocytes Relative: 6 %
Lymphs Abs: 1.2 10*3/uL (ref 0.7–4.0)
MCH: 32.4 pg (ref 26.0–34.0)
MCHC: 32.9 g/dL (ref 30.0–36.0)
MCV: 98.4 fL (ref 80.0–100.0)
Monocytes Absolute: 0.8 10*3/uL (ref 0.1–1.0)
Monocytes Relative: 4 %
Neutro Abs: 16.2 10*3/uL — ABNORMAL HIGH (ref 1.7–7.7)
Neutrophils Relative %: 89 %
Platelets: 724 10*3/uL — ABNORMAL HIGH (ref 150–400)
RBC: 2.5 MIL/uL — ABNORMAL LOW (ref 3.87–5.11)
RDW: 25.3 % — ABNORMAL HIGH (ref 11.5–15.5)
WBC: 18.2 10*3/uL — ABNORMAL HIGH (ref 4.0–10.5)
nRBC: 0 % (ref 0.0–0.2)

## 2021-07-05 LAB — COMPREHENSIVE METABOLIC PANEL
ALT: 23 U/L (ref 0–44)
AST: 32 U/L (ref 15–41)
Albumin: 3.2 g/dL — ABNORMAL LOW (ref 3.5–5.0)
Alkaline Phosphatase: 128 U/L — ABNORMAL HIGH (ref 38–126)
Anion gap: 9 (ref 5–15)
BUN: 20 mg/dL (ref 8–23)
CO2: 24 mmol/L (ref 22–32)
Calcium: 9.7 mg/dL (ref 8.9–10.3)
Chloride: 101 mmol/L (ref 98–111)
Creatinine, Ser: 0.88 mg/dL (ref 0.44–1.00)
GFR, Estimated: >60 mL/min (ref >60)
Glucose, Bld: 134 mg/dL — ABNORMAL HIGH (ref 70–99)
Potassium: 3.4 mmol/L — ABNORMAL LOW (ref 3.5–5.1)
Sodium: 134 mmol/L — ABNORMAL LOW (ref 135–145)
Total Bilirubin: 0.6 mg/dL (ref 0.3–1.2)
Total Protein: 6.8 g/dL (ref 6.5–8.1)

## 2021-07-05 LAB — ABO/RH: ABO/RH(D): A POS

## 2021-07-05 LAB — PREPARE RBC (CROSSMATCH)

## 2021-07-05 MED ORDER — DIPHENHYDRAMINE HCL 25 MG PO CAPS
25.0000 mg | ORAL_CAPSULE | Freq: Once | ORAL | Status: AC
  Administered 2021-07-05: 25 mg via ORAL
  Filled 2021-07-05: qty 1

## 2021-07-05 MED ORDER — SODIUM CHLORIDE 0.9% IV SOLUTION
250.0000 mL | Freq: Once | INTRAVENOUS | Status: AC
  Administered 2021-07-05: 250 mL via INTRAVENOUS
  Filled 2021-07-05: qty 250

## 2021-07-05 MED ORDER — SODIUM CHLORIDE 0.9% FLUSH
10.0000 mL | Freq: Once | INTRAVENOUS | Status: DC
  Administered 2021-07-05: 10 mL via INTRAVENOUS
  Filled 2021-07-05: qty 10

## 2021-07-05 MED ORDER — POTASSIUM CHLORIDE CRYS ER 20 MEQ PO TBCR
20.0000 meq | EXTENDED_RELEASE_TABLET | Freq: Two times a day (BID) | ORAL | 0 refills | Status: DC
Start: 2021-07-05 — End: 2021-07-09

## 2021-07-05 MED ORDER — HEPARIN SOD (PORK) LOCK FLUSH 100 UNIT/ML IV SOLN
500.0000 [IU] | Freq: Once | INTRAVENOUS | Status: DC
  Administered 2021-07-05: 500 [IU] via INTRAVENOUS
  Filled 2021-07-05: qty 5

## 2021-07-05 MED ORDER — ACETAMINOPHEN 325 MG PO TABS
650.0000 mg | ORAL_TABLET | Freq: Once | ORAL | Status: AC
  Administered 2021-07-05: 650 mg via ORAL
  Filled 2021-07-05: qty 2

## 2021-07-05 MED ORDER — APIXABAN 5 MG PO TABS: 5.0000 mg | ORAL_TABLET | Freq: Two times a day (BID) | ORAL | 0 refills | Status: DC

## 2021-07-05 NOTE — Progress Notes (Signed)
Hematology/Oncology progress note    Patient Care Team: Adin Hector, MD as PCP - General (Internal Medicine) Earlie Server, MD as Consulting Physician (Hematology and Oncology) Clent Jacks, RN as Oncology Nurse Navigator  REFERRING PROVIDER: Adin Hector, MD  CHIEF COMPLAINTS/REASON FOR VISIT:  Malignant ascites, ovarian neoplasm, pulmonary embolism  HISTORY OF PRESENTING ILLNESS:   Brittany Montoya is a  60 y.o.  female with PMH listed below was seen in consultation at the request of  Adin Hector, MD  for evaluation of complicated ovarian cyst, ascites  Patient has noticed generalized abdominal distention and bloating, nausea and constipation for the past months and  03/23/2021, CT abdomen pelvis with contrast showed large solid and cystic right ovarian mass measuring 14.1 x 18.1 x 16 cm highly suspicious for primary ovarian malignancy.  Evidence of peritoneal spread.  Moderate associated ascites. Slightly small liver with mild nodular contour suggesting a degree of cirrhosis.  04/03/2021, CA125 70.9, Hg 4 400 09.  04/04/2021, was seen by gynecology Dr. Ouida Sills.  And was referred to establish care with GYN oncology. 04/09/2021, patient underwent paracentesis and had 6.3 L of hazy yellow fluid removed.  Cytology is pending.  04/12/2021 patient was referred to see Dr. Theora Gianotti and me.  Patient also has noticed bilateral lower extremity edema, progressively worsening.  Fatigued, shortness of breath with exertion.  Denies any chest pain, cough.  Poor oral intake due to decreased appetite She was accompanied by her husband. Denies any alcohol use or previous hepatitis infection.  She is not aware about cirrhosis. She reports some symptom relief after the paracentesis.  Her abdomen seems to got better and getting worse again.  04/19/21- carbo AUC 5- taxol 135 mg/m2 05/10/21- carbo AUC 6 - taxol 150 mg/m2; reaction to taxol- discontinued 06/07/21- carbo AUC 6 - abraxane  260 mg/m2  06/01/2021, patient has US abdomen paracentesis and had 4 L of fluid removed.  #06/25/2021 1. Redemonstrated large, mixed solid and cystic mass of the right ovary, which is slightly diminished in size. 2. Peritoneal and omental thickening and nodularity is improved compared to prior examination, although still present. 3. Findings are consistent with treatment response of primary ovarian malignancy and peritoneal and omental metastatic disease. 4. Moderate volume ascites throughout the abdomen and pelvis, similar in volume to prior. 5. No evidence of metastatic disease in the chest.  06/26/2021 therapeutic paracentesis.    INTERVAL HISTORY Brittany Montoya is a 60 y.o. female who has above history reviewed by me today presents for follow up visit for malignant ascites, ovarian neoplasm, pulmonary embolism.   06/27/2021 seen by Dr.Secord. patient is deconditioned. Dr.Secord recommend to continue neoadjuvant chemotherapy and optimize her health status with physical therapy and dietitian.   Patient has started physical therapy and nutrition supplements.  She reports feeling tried today. Low energy level.  Chronic SOB unchanged.  Bruises from previous lovenox injections She has switched back to Eliquis 5mg  BID.   Review of Systems  Constitutional:  Positive for appetite change and fatigue. Negative for chills and fever.  HENT:   Negative for hearing loss and voice change.   Eyes:  Negative for eye problems.  Respiratory:  Positive for shortness of breath. Negative for chest tightness and cough.   Cardiovascular:  Negative for chest pain.  Gastrointestinal:  Positive for abdominal distention and constipation. Negative for abdominal pain and blood in stool.  Endocrine: Negative for hot flashes.  Genitourinary:  Negative for difficulty urinating  and frequency.   Musculoskeletal:  Negative for arthralgias.  Skin:  Negative for itching and rash.  Neurological:  Negative for  extremity weakness.  Hematological:  Negative for adenopathy.  Psychiatric/Behavioral:  Negative for confusion. The patient is not nervous/anxious.    MEDICAL HISTORY:  Past Medical History:  Diagnosis Date   Anxiety    Arthritis    Ascites    Cirrhosis of liver ([place])    DDD (degenerative disc disease), cervical    Depression    Family history of breast cancer    GERD (gastroesophageal reflux disease)    Glaucoma    Headache    migraines   Hypertension    Ovarian cancer ([place]) 04/15/2021   PONV (postoperative nausea and vomiting)    Pulmonary embolism ([place])    Sleep apnea    Venous stasis     SURGICAL HISTORY: Past Surgical History:  Procedure Laterality Date   ABDOMINAL HYSTERECTOMY N/A 2008   may have had left ovary removed   [place]     2012, 2015, 2020   COLONOSCOPY N/A 06/29/2021   Procedure: COLONOSCOPY;  Surgeon: Toledo, Benay Pike, MD;  Location: [facility] ENDOSCOPY;  Service: Gastroenterology;  Laterality: N/A;  DOCTORS ARE IN AGREEMENT THAT TOLEDO CAN DO THIS PROCEDURE IN RUSSO'S BLOCK   ESOPHAGOGASTRODUODENOSCOPY N/A 06/29/2021   Procedure: ESOPHAGOGASTRODUODENOSCOPY (EGD);  Surgeon: Toledo, Benay Pike, MD;  Location: [facility] ENDOSCOPY;  Service: Gastroenterology;  Laterality: N/A;   PARACENTESIS  04/20/2021   PILONIDAL CYST / SINUS EXCISION     PORTACATH PLACEMENT Right 06/01/2021   Procedure: INSERTION PORT-A-CATH;  Surgeon: Herbert Pun, MD;  Location: [facility] ORS;  Service: General;  Laterality: Right;    SOCIAL HISTORY: Social History   Socioeconomic History   Marital status: Married    Spouse name: Barnabas Lister   Number of children: 2   Years of education: Not on file   Highest education level: Not on file  Occupational History   Not on file  Tobacco Use   Smoking status: Never   Smokeless tobacco: Never  Vaping Use   Vaping Use: Never used  Substance and Sexual Activity   Alcohol use: No    Drug use: No   Sexual activity: Not Currently    Birth control/protection: Surgical  Other Topics Concern   Not on file  Social History Narrative   Not on file   Social Determinants of Health   Financial Resource Strain: Not on file  Food Insecurity: Not on file  Transportation Needs: Not on file  Physical Activity: Not on file  Stress: Not on file  Social Connections: Not on file  Intimate Partner Violence: Not on file    FAMILY HISTORY: Family History  Problem Relation Age of Onset   Breast cancer Mother 34   Breast cancer Maternal Aunt        d. under 61    ALLERGIES:  is allergic to paclitaxel, amlodipine, and penicillins.  MEDICATIONS:  Current Outpatient Medications  Medication Sig Dispense Refill   acetaminophen (TYLENOL) 500 MG tablet Take 1,000 mg by mouth at bedtime.     albuterol (VENTOLIN HFA) 108 (90 Base) MCG/ACT inhaler Inhale 1-2 puffs into the lungs every 6 (six) hours as needed for wheezing or shortness of breath.     clonazePAM (KLONOPIN) 0.5 MG tablet Take 1 tablet (0.5 mg total) by mouth daily as needed for anxiety. 30 tablet 0  dexamethasone (DECADRON) 4 MG tablet Take 2 tablets (8 mg total) by mouth See admin instructions. Take 8mg  daily for 2 days prior to your chemotherapy 30 tablet 0   enoxaparin (LOVENOX) 80 MG/0.8ML injection Inject 0.8 mLs (80 mg total) into the skin every 12 (twelve) hours. 48 mL 1   hydrochlorothiazide (HYDRODIURIL) 12.5 MG tablet Take 12.5 mg by mouth daily.     latanoprost (XALATAN) 0.005 % ophthalmic solution Place 1 drop into both eyes at bedtime.     lidocaine-prilocaine (EMLA) cream Apply to affected area once (Patient taking differently: Apply 1 application topically daily as needed (pain). Apply to affected area once) 30 g 3   losartan (COZAAR) 50 MG tablet Take 50 mg by mouth daily.     Multiple Vitamin (MULTIVITAMIN WITH MINERALS) TABS tablet Take 1 tablet by mouth daily.     ondansetron (ZOFRAN) 8 MG tablet Take  1 tablet (8 mg total) by mouth 2 (two) times daily as needed for refractory nausea / vomiting. Start on day 3 after carboplatin chemo. 30 tablet 1   potassium chloride SA (KLOR-CON) 20 MEQ tablet Take 1 tablet (20 mEq total) by mouth 2 (two) times daily. 60 tablet 0   prochlorperazine (COMPAZINE) 10 MG tablet Take 1 tablet (10 mg total) by mouth every 6 (six) hours as needed (Nausea or vomiting). 30 tablet 1   timolol (TIMOPTIC) 0.5 % ophthalmic solution Place 1 drop into both eyes 2 (two) times daily.     traMADol (ULTRAM) 50 MG tablet Take 50 mg by mouth every 6 (six) hours as needed.     triamcinolone cream (KENALOG) 0.1 % Apply 1 application topically daily as needed (irritation).     No current facility-administered medications for this visit.   Facility-Administered Medications Ordered in Other Visits  Medication Dose Route Frequency Provider Last Rate Last Admin   heparin lock flush 100 unit/mL  500 Units Intravenous Once Earlie Server, MD       LORazepam (ATIVAN) injection 0.5 mg  0.5 mg Intravenous Once PRN Earlie Server, MD       sodium chloride flush (NS) 0.9 % injection 10 mL  10 mL Intravenous Once Earlie Server, MD         PHYSICAL EXAMINATION: ECOG PERFORMANCE STATUS: 1 - Symptomatic but completely ambulatory There were no vitals filed for this visit.   There were no vitals filed for this visit.   Physical Exam Constitutional:      General: She is not in acute distress.    Appearance: She is obese.  HENT:     Head: Normocephalic and atraumatic.  Eyes:     General: No scleral icterus. Cardiovascular:     Rate and Rhythm: Normal rate and regular rhythm.     Heart sounds: Normal heart sounds.  Pulmonary:     Effort: Pulmonary effort is normal. No respiratory distress.     Breath sounds: Normal breath sounds. No wheezing.  Abdominal:     General: Bowel sounds are normal. There is distension.     Palpations: Abdomen is soft.  Musculoskeletal:        General: No deformity. Normal  range of motion.     Cervical back: Normal range of motion and neck supple.  Skin:    General: Skin is warm and dry.     Findings: No erythema or rash.  Neurological:     Mental Status: She is alert and oriented to person, place, and time. Mental status is at baseline.  Cranial Nerves: No cranial nerve deficit.     Coordination: Coordination normal.  Psychiatric:        Mood and Affect: Mood normal.    LABORATORY DATA:  I have reviewed the data as listed Lab Results  Component Value Date   WBC 15.5 (H) 06/21/2021   HGB 7.7 (L) 06/21/2021   HCT 22.7 (L) 06/21/2021   MCV 93.4 06/21/2021   PLT 88 (L) 06/21/2021   Recent Labs    05/24/21 0850 05/31/21 0811 06/07/21 0814  NA 134* 132* 129*  K 3.4* 3.2* 3.6  CL 100 99 97*  CO2 26 23 23   GLUCOSE 104* 133* 106*  BUN 9 13 16   CREATININE 0.86 0.90 0.93  CALCIUM 9.1 9.5 9.7  GFRNONAA >60 >60 >60  PROT 6.9 7.0 6.2*  ALBUMIN 3.0* 3.3* 2.9*  AST 35 37 24  ALT 23 20 19   ALKPHOS 126 105 107  BILITOT <0.1* 0.4 0.4    Iron/TIBC/Ferritin/ %Sat No results found for: IRON, TIBC, FERRITIN, IRONPCTSAT    RADIOGRAPHIC STUDIES: I have personally reviewed the radiological images as listed and agreed with the findings in the report. CT CHEST ABDOMEN PELVIS W CONTRAST  Result Date: 06/26/2021 CLINICAL DATA:  Ovarian cancer restaging, ongoing chemotherapy, assess treatment response EXAM: CT CHEST, ABDOMEN, AND PELVIS WITH CONTRAST TECHNIQUE: Multidetector CT imaging of the chest, abdomen and pelvis was performed following the standard protocol during bolus administration of intravenous contrast. CONTRAST:  123mL OMNIPAQUE IOHEXOL 350 MG/ML SOLN, additional oral enteric contrast COMPARISON:  CT abdomen pelvis, 03/23/2021 FINDINGS: CT CHEST FINDINGS Cardiovascular: Right chest port catheter. Normal heart size. No pericardial effusion. Mediastinum/Nodes: No enlarged mediastinal, hilar, or axillary lymph nodes. Thyroid gland, trachea, and  esophagus demonstrate no significant findings. Lungs/Pleura: Lungs are clear. No pleural effusion or pneumothorax. Musculoskeletal: No chest wall mass or suspicious bone lesions identified. CT ABDOMEN PELVIS FINDINGS Hepatobiliary: No focal liver abnormality is seen. Status post cholecystectomy. No biliary dilatation. Pancreas: Unremarkable. No pancreatic ductal dilatation or surrounding inflammatory changes. Spleen: Normal in size without significant abnormality. Adrenals/Urinary Tract: Adrenal glands are unremarkable. Kidneys are normal, without renal calculi, solid lesion, or hydronephrosis. Bladder is unremarkable. Stomach/Bowel: Stomach is within normal limits. Appendix not clearly visualized and may be surgically absent. No evidence of bowel wall thickening, distention, or inflammatory changes. Vascular/Lymphatic: No significant vascular findings are present. No enlarged abdominal or pelvic lymph nodes. Reproductive: Redemonstrated large, mixed solid and cystic mass of the right ovary, which is slightly diminished in size, measuring 18.1 x 12.7 x 12.8 cm, previously 18.1 x 14.1 x 16 cm (series 2, image 83, series 4, image 74). Status post hysterectomy. Other: No abdominal wall hernia or abnormality. Moderate volume ascites throughout the abdomen and pelvis, similar in volume to prior. Peritoneal and omental thickening and nodularity is improved compared to prior examination, although still present (e.g. Series 2, image 58, 97). Musculoskeletal: No acute or significant osseous findings. IMPRESSION: 1. Redemonstrated large, mixed solid and cystic mass of the right ovary, which is slightly diminished in size. 2. Peritoneal and omental thickening and nodularity is improved compared to prior examination, although still present. 3. Findings are consistent with treatment response of primary ovarian malignancy and peritoneal and omental metastatic disease. 4. Moderate volume ascites throughout the abdomen and pelvis,  similar in volume to prior. 5. No evidence of metastatic disease in the chest. Electronically Signed   By: Delanna Ahmadi M.D.   On: 06/26/2021 16:57   US Paracentesis  Result Date:  06/26/2021 INDICATION: History of ovarian cancer with recurrent ascites request received for therapeutic paracentesis. EXAM: ULTRASOUND GUIDED THERAPEUTIC PARACENTESIS MEDICATIONS: Local 1% lidocaine only. COMPLICATIONS: None immediate. PROCEDURE: Informed written consent was obtained from the patient after a discussion of the risks, benefits and alternatives to treatment. A timeout was performed prior to the initiation of the procedure. Initial ultrasound scanning demonstrates a moderate amount of ascites within the right lower abdominal quadrant. The right lower abdomen was prepped and draped in the usual sterile fashion. 1% lidocaine was used for local anesthesia. Following this, a 19 gauge, 10-cm, Yueh catheter was introduced. An ultrasound image was saved for documentation purposes. The paracentesis was performed. The catheter was removed and a dressing was applied. The patient tolerated the procedure well without immediate post procedural complication. FINDINGS: A total of approximately 2.4 L of clear yellow fluid was removed. IMPRESSION: Successful ultrasound-guided paracentesis yielding 2.4 liters of peritoneal fluid. Read By: Tsosie Billing PA-C Electronically Signed   By: Aletta Edouard M.D.   On: 06/26/2021 16:11      ASSESSMENT & PLAN:  1. 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 Malignant ascites   3. Chemotherapy induced neutropenia ([facility])   4. Encounter for antineoplastic chemotherapy   5. Other acute pulmonary embolism without acute cor pulmonale ([facility])    #Large ovarian cystic mass with malignant ascites, peritoneal nodularity Case was discussed on Gynonc tumor board on 04/25/2021. Consensus reached upon clinical diagnosis of locally advanced Stage II/III Ovarian Cancer, neoadjuvant chemotherapy followed  by debulking surgery.   CT images were reviewed and discussed with patient and daughter- partial response.  Discussed with Dr.Secord, will continue neoadjuvant chemotherapy carboplatin and Abraxane, also further optimize her performance status and nutrition status.   Labs are reviewed and discussed with patient. Symptomatic anemia, proceed with 1 unit PRBC transfusion.  Will proceed with Cycle 4 Carboplatin Abraxane on 07/06/2021 Day 4 Udenyca 07/09/2021    #Thrombocytosis platelet 724,000 - reactive. #Bilateral pulmonary embolism continue Eliquis 5mg  BID.   She has establish care with genetic counselor.   # Anxiety, she is on Cymbalta 30mg  daily. She takes  Clonazepam 0.5mg  QHS PRN   # Malignant ascites, paracentesis PRN if symptomatic.     #  hypokalemia, potassium has improved.  Continue Potassium 93meq BID   # CA125/CEA ratio is <25,  06/29/2021 colonoscopy showed non bleeding internal hemorrhoids. Diverticulosis,  Distal transverse colon 4 mm polyp, resected and retrieved.-Pathology showed polypoid colonic mucosa with intramucosal lymphoid aggregate.  Negative for malignancy and dysplasia. Upper endoscopy showed esophageal mucosal changes suspicious for eosinophilic esophagitis.  Biopsied-pathology showed reflux esophagitis.  Negative for increased eosinophils..  Benign-appearing esophageal stenosis.  Hiatal hernia.  #Radiographic evidence of liver cirrhosis Compensated.  Follow-up with gastroenterology.  All questions were answered. The patient knows to call the clinic with any problems questions or concerns.  cc Adin Hector, MD   Follow up plan  carboplatin Abraxane on 10/21, Udenyca on 10/24.  follow up lab cbc cmp NP around 10/31.  If she is not getting surgery in 3-4 weeks, she will  follow up lab cbc cmp Ca125 hold tube on 11/11. MD carboplatin Abraxane with Day 4 udenyca +/- PRBC transfusion  We spent sufficient time to discuss many aspect of care, questions  were answered to patient's satisfaction.  Earlie Server, MD, PhD Hematology Oncology [place] at [facility] 07/05/2021

## 2021-07-05 NOTE — Progress Notes (Signed)
Nutrition Assessment   Reason for Assessment:   Increase nutrition prior to surgery   ASSESSMENT:  60 year old female with probable advanced ovarian cancer with malignant ascites.  S/p colonoscopy.  Past medical history of HTN, cirrhosis, bilateral LE edema, PE.  Chemo held today.    Met with patient in infusion.  To receive blood today.  Patient reports that she usually eats a pop tart or cereal or [employer] breakfast sandwich for breakfast.  May have a granola bar mid morning. Lunch is sandwich or left overs.  In the afternoon has started drinking ensure max protein shake.  Dinner husband usually picks up a burger or vegetables.    Patient reports recent paracentesis with 2 liters removed.  Says that she does have some issues with nausea but medication helps. Has constipation issues but takes miralax.  Also has pain but has gotten better about taking pain medications to help.      Medications: MVI, zofran, KCL, compazine   Labs: reviewed   Anthropometrics:   Height: 61 inches Weight: 184 lb today 198 lb 04/11/21 BMI: 34  7% weight loss in the last 3 months (?fluid)   Estimated Energy Needs  Kcals: 2100 Protein: 105 g Fluid: 2 L   NUTRITION DIAGNOSIS: Inadequate oral intake related to cancer related treatment side effects as evidenced by 7% weight loss in the last 3 months, decreased intake   INTERVENTION:  Discussed importance of taking medication to help control pain, nausea and constipation to allow her to continue to eat.   Discussed importance of protein rich foods. Continue ensure max protein at least 1 time per day.  Contact information given   MONITORING, EVALUATION, GOAL: weight trends, intake   Next Visit: to be determined with treatment  Arlicia Paquette B. Zenia Resides, [place], [place] Registered Dietitian [phone] (mobile)

## 2021-07-05 NOTE — Patient Instructions (Signed)
[facility]  Discharge Instructions: Thank you for choosing [facility] to provide your oncology and hematology care.  If you have a lab appointment with the [facility], please go directly to the [facility] and check in at the registration area.  Wear comfortable clothing and clothing appropriate for easy access to any Portacath or PICC line.   We strive to give you quality time with your provider. You may need to reschedule your appointment if you arrive late (15 or more minutes).  Arriving late affects you and other patients whose appointments are after yours.  Also, if you miss three or more appointments without notifying the office, you may be dismissed from the clinic at the provider's discretion.      For prescription refill requests, have your pharmacy contact our office and allow 72 hours for refills to be completed.    Today you received the following chemotherapy and/or immunotherapy agents Blood Transfusion      To help prevent nausea and vomiting after your treatment, we encourage you to take your nausea medication as directed.  BELOW ARE SYMPTOMS THAT SHOULD BE REPORTED IMMEDIATELY: *FEVER GREATER THAN 100.4 F (38 C) OR HIGHER *CHILLS OR SWEATING *NAUSEA AND VOMITING THAT IS NOT CONTROLLED WITH YOUR NAUSEA MEDICATION *UNUSUAL SHORTNESS OF BREATH *UNUSUAL BRUISING OR BLEEDING *URINARY PROBLEMS (pain or burning when urinating, or frequent urination) *BOWEL PROBLEMS (unusual diarrhea, constipation, pain near the anus) TENDERNESS IN MOUTH AND THROAT WITH OR WITHOUT PRESENCE OF ULCERS (sore throat, sores in mouth, or a toothache) UNUSUAL RASH, SWELLING OR PAIN  UNUSUAL VAGINAL DISCHARGE OR ITCHING   Items with * indicate a potential emergency and should be followed up as soon as possible or go to the Emergency Department if any problems should occur.  Please show the CHEMOTHERAPY ALERT CARD or IMMUNOTHERAPY ALERT CARD at  check-in to the Emergency Department and triage nurse.  Should you have questions after your visit or need to cancel or reschedule your appointment, please contact [facility]  [phone] and follow the prompts.  Office hours are 8:00 a.m. to 4:30 p.m. Monday - Friday. Please note that voicemails left after 4:00 p.m. may not be returned until the following business day.  We are closed weekends and major holidays. You have access to a nurse at all times for urgent questions. Please call the main number to the clinic [phone] and follow the prompts.  For any non-urgent questions, you may also contact your provider using MyChart. We now offer e-Visits for anyone 61 and older to request care online for non-urgent symptoms. For details visit [email or web address].   Also download the MyChart app! Go to the app store, search "MyChart", open the app, select [facility], and log in with your MyChart username and password.  Due to Covid, a mask is required upon entering the hospital/clinic. If you do not have a mask, one will be given to you upon arrival. For doctor visits, patients may have 1 support person aged 22 or older with them. For treatment visits, patients cannot have anyone with them due to current Covid guidelines and our immunocompromised population.   Blood Transfusion, Adult, Care After This sheet gives you information about how to care for yourself after your procedure. Your doctor may also give you more specific instructions. If you have problems or questions, contact your doctor. What can I expect after the procedure? After the procedure, it is common to have: Bruising and  soreness at the IV site. A headache. Follow these instructions at home: Insertion site care   Follow instructions from your doctor about how to take care of your insertion site. This is where an IV tube was put into your vein. Make sure you: Wash your hands with soap and  water before and after you change your bandage (dressing). If you cannot use soap and water, use hand sanitizer. Change your bandage as told by your doctor. Check your insertion site every day for signs of infection. Check for: Redness, swelling, or pain. Bleeding from the site. Warmth. Pus or a bad smell. General instructions Take over-the-counter and prescription medicines only as told by your doctor. Rest as told by your doctor. Go back to your normal activities as told by your doctor. Keep all follow-up visits as told by your doctor. This is important. Contact a doctor if: You have itching or red, swollen areas of skin (hives). You feel worried or nervous (anxious). You feel weak after doing your normal activities. You have redness, swelling, warmth, or pain around the insertion site. You have blood coming from the insertion site, and the blood does not stop with pressure. You have pus or a bad smell coming from the insertion site. Get help right away if: You have signs of a serious reaction. This may be coming from an allergy or the body's defense system (immune system). Signs include: Trouble breathing or shortness of breath. Swelling of the face or feeling warm (flushed). Fever or chills. Head, chest, or back pain. Dark pee (urine) or blood in the pee. Widespread rash. Fast heartbeat. Feeling dizzy or light-headed. You may receive your blood transfusion in an outpatient setting. If so, you will be told whom to contact to report any reactions. These symptoms may be an emergency. Do not wait to see if the symptoms will go away. Get medical help right away. Call your local emergency services (911 in the [place] Do not drive yourself to the hospital. Summary Bruising and soreness at the IV site are common. Check your insertion site every day for signs of infection. Rest as told by your doctor. Go back to your normal activities as told by your doctor. Get help right away if you  have signs of a serious reaction. This information is not intended to replace advice given to you by your health care provider. Make sure you discuss any questions you have with your health care provider. Document Revised: 12/28/2020 Document Reviewed: 02/25/2019 Elsevier Patient Education  [place].

## 2021-07-06 ENCOUNTER — Inpatient Hospital Stay: Payer: BC Managed Care – PPO

## 2021-07-06 VITALS — BP 109/73 | HR 100 | Temp 97.2°F | Resp 20

## 2021-07-06 DIAGNOSIS — C569 Malignant neoplasm of unspecified ovary: Secondary | ICD-10-CM

## 2021-07-06 DIAGNOSIS — C561 Malignant neoplasm of right ovary: Secondary | ICD-10-CM | POA: Diagnosis not present

## 2021-07-06 LAB — TYPE AND SCREEN
ABO/RH(D): A POS
Antibody Screen: NEGATIVE
Unit division: 0

## 2021-07-06 LAB — BPAM RBC
Blood Product Expiration Date: 202210262359
ISSUE DATE / TIME: 202210201125
Unit Type and Rh: 600

## 2021-07-06 LAB — CA 125: Cancer Antigen (CA) 125: 171 U/mL — ABNORMAL HIGH (ref 0.0–38.1)

## 2021-07-06 MED ORDER — SODIUM CHLORIDE 0.9 % IV SOLN
150.0000 mg | Freq: Once | INTRAVENOUS | Status: AC
  Administered 2021-07-06: 150 mg via INTRAVENOUS
  Filled 2021-07-06: qty 150

## 2021-07-06 MED ORDER — PALONOSETRON HCL INJECTION 0.25 MG/5ML
0.2500 mg | Freq: Once | INTRAVENOUS | Status: AC
  Administered 2021-07-06: 0.25 mg via INTRAVENOUS
  Filled 2021-07-06: qty 5

## 2021-07-06 MED ORDER — LORAZEPAM 2 MG/ML IJ SOLN
0.5000 mg | Freq: Once | INTRAMUSCULAR | Status: AC | PRN
  Administered 2021-07-06: 0.5 mg via INTRAVENOUS
  Filled 2021-07-06: qty 1

## 2021-07-06 MED ORDER — HEPARIN SOD (PORK) LOCK FLUSH 100 UNIT/ML IV SOLN
500.0000 [IU] | Freq: Once | INTRAVENOUS | Status: AC | PRN
  Administered 2021-07-06: 500 [IU]
  Filled 2021-07-06: qty 5

## 2021-07-06 MED ORDER — PACLITAXEL PROTEIN-BOUND CHEMO INJECTION 100 MG
260.0000 mg/m2 | Freq: Once | INTRAVENOUS | Status: AC
  Administered 2021-07-06: 500 mg via INTRAVENOUS
  Filled 2021-07-06: qty 100

## 2021-07-06 MED ORDER — SODIUM CHLORIDE 0.9 % IV SOLN
682.8000 mg | Freq: Once | INTRAVENOUS | Status: AC
  Administered 2021-07-06: 680 mg via INTRAVENOUS
  Filled 2021-07-06: qty 68

## 2021-07-06 MED ORDER — SODIUM CHLORIDE 0.9% FLUSH
10.0000 mL | INTRAVENOUS | Status: DC | PRN
  Administered 2021-07-06: 10 mL
  Filled 2021-07-06: qty 10

## 2021-07-06 MED ORDER — SODIUM CHLORIDE 0.9 % IV SOLN
Freq: Once | INTRAVENOUS | Status: AC
  Filled 2021-07-06: qty 250

## 2021-07-06 MED ORDER — SODIUM CHLORIDE 0.9 % IV SOLN: 722.4000 mg | Freq: Once | INTRAVENOUS | Status: DC

## 2021-07-06 MED ORDER — SODIUM CHLORIDE 0.9 % IV SOLN
10.0000 mg | Freq: Once | INTRAVENOUS | Status: AC
  Administered 2021-07-06: 10 mg via INTRAVENOUS
  Filled 2021-07-06: qty 10

## 2021-07-06 NOTE — Patient Instructions (Signed)
[facility]  Discharge Instructions: Thank you for choosing [facility] to provide your oncology and hematology care.  If you have a lab appointment with the [facility], please go directly to the [facility] and check in at the registration area.  Wear comfortable clothing and clothing appropriate for easy access to any Portacath or PICC line.   We strive to give you quality time with your provider. You may need to reschedule your appointment if you arrive late (15 or more minutes).  Arriving late affects you and other patients whose appointments are after yours.  Also, if you miss three or more appointments without notifying the office, you may be dismissed from the clinic at the provider's discretion.      For prescription refill requests, have your pharmacy contact our office and allow 72 hours for refills to be completed.    Today you received the following chemotherapy and/or immunotherapy agents: Abraxane, Carboplatin      To help prevent nausea and vomiting after your treatment, we encourage you to take your nausea medication as directed.  BELOW ARE SYMPTOMS THAT SHOULD BE REPORTED IMMEDIATELY: *FEVER GREATER THAN 100.4 F (38 C) OR HIGHER *CHILLS OR SWEATING *NAUSEA AND VOMITING THAT IS NOT CONTROLLED WITH YOUR NAUSEA MEDICATION *UNUSUAL SHORTNESS OF BREATH *UNUSUAL BRUISING OR BLEEDING *URINARY PROBLEMS (pain or burning when urinating, or frequent urination) *BOWEL PROBLEMS (unusual diarrhea, constipation, pain near the anus) TENDERNESS IN MOUTH AND THROAT WITH OR WITHOUT PRESENCE OF ULCERS (sore throat, sores in mouth, or a toothache) UNUSUAL RASH, SWELLING OR PAIN  UNUSUAL VAGINAL DISCHARGE OR ITCHING   Items with * indicate a potential emergency and should be followed up as soon as possible or go to the Emergency Department if any problems should occur.  Please show the CHEMOTHERAPY ALERT CARD or IMMUNOTHERAPY ALERT CARD  at check-in to the Emergency Department and triage nurse.  Should you have questions after your visit or need to cancel or reschedule your appointment, please contact [facility]  [phone] and follow the prompts.  Office hours are 8:00 a.m. to 4:30 p.m. Monday - Friday. Please note that voicemails left after 4:00 p.m. may not be returned until the following business day.  We are closed weekends and major holidays. You have access to a nurse at all times for urgent questions. Please call the main number to the clinic [phone] and follow the prompts.  For any non-urgent questions, you may also contact your provider using MyChart. We now offer e-Visits for anyone 3 and older to request care online for non-urgent symptoms. For details visit [email or web address].   Also download the MyChart app! Go to the app store, search "MyChart", open the app, select [facility], and log in with your MyChart username and password.  Due to Covid, a mask is required upon entering the hospital/clinic. If you do not have a mask, one will be given to you upon arrival. For doctor visits, patients may have 1 support person aged 7 or older with them. For treatment visits, patients cannot have anyone with them due to current Covid guidelines and our immunocompromised population. Carboplatin injection What is this medication? CARBOPLATIN (KAR boe pla tin) is a chemotherapy drug. It targets fast dividing cells, like cancer cells, and causes these cells to die. This medicine is used to treat ovarian cancer and many other cancers. This medicine may be used for other purposes; ask your health care provider or pharmacist  if you have questions. COMMON BRAND NAME(S): Paraplatin What should I tell my care team before I take this medication? They need to know if you have any of these conditions: blood disorders hearing problems kidney disease recent or ongoing radiation therapy an  unusual or allergic reaction to carboplatin, cisplatin, other chemotherapy, other medicines, foods, dyes, or preservatives pregnant or trying to get pregnant breast-feeding How should I use this medication? This drug is usually given as an infusion into a vein. It is administered in a hospital or clinic by a specially trained health care professional. Talk to your pediatrician regarding the use of this medicine in children. Special care may be needed. Overdosage: If you think you have taken too much of this medicine contact a poison control center or emergency room at once. NOTE: This medicine is only for you. Do not share this medicine with others. What if I miss a dose? It is important not to miss a dose. Call your doctor or health care professional if you are unable to keep an appointment. What may interact with this medication? medicines for seizures medicines to increase blood counts like filgrastim, pegfilgrastim, sargramostim some antibiotics like amikacin, gentamicin, neomycin, streptomycin, tobramycin vaccines Talk to your doctor or health care professional before taking any of these medicines: acetaminophen aspirin ibuprofen ketoprofen naproxen This list may not describe all possible interactions. Give your health care provider a list of all the medicines, herbs, non-prescription drugs, or dietary supplements you use. Also tell them if you smoke, drink alcohol, or use illegal drugs. Some items may interact with your medicine. What should I watch for while using this medication? Your condition will be monitored carefully while you are receiving this medicine. You will need important blood work done while you are taking this medicine. This drug may make you feel generally unwell. This is not uncommon, as chemotherapy can affect healthy cells as well as cancer cells. Report any side effects. Continue your course of treatment even though you feel ill unless your doctor tells you to  stop. In some cases, you may be given additional medicines to help with side effects. Follow all directions for their use. Call your doctor or health care professional for advice if you get a fever, chills or sore throat, or other symptoms of a cold or flu. Do not treat yourself. This drug decreases your body's ability to fight infections. Try to avoid being around people who are sick. This medicine may increase your risk to bruise or bleed. Call your doctor or health care professional if you notice any unusual bleeding. Be careful brushing and flossing your teeth or using a toothpick because you may get an infection or bleed more easily. If you have any dental work done, tell your dentist you are receiving this medicine. Avoid taking products that contain aspirin, acetaminophen, ibuprofen, naproxen, or ketoprofen unless instructed by your doctor. These medicines may hide a fever. Do not become pregnant while taking this medicine. Women should inform their doctor if they wish to become pregnant or think they might be pregnant. There is a potential for serious side effects to an unborn child. Talk to your health care professional or pharmacist for more information. Do not breast-feed an infant while taking this medicine. What side effects may I notice from receiving this medication? Side effects that you should report to your doctor or health care professional as soon as possible: allergic reactions like skin rash, itching or hives, swelling of the face, lips, or tongue signs  of infection - fever or chills, cough, sore throat, pain or difficulty passing urine signs of decreased platelets or bleeding - bruising, pinpoint red spots on the skin, black, tarry stools, nosebleeds signs of decreased red blood cells - unusually weak or tired, fainting spells, lightheadedness breathing problems changes in hearing changes in vision chest pain high blood pressure low blood counts - This drug may decrease the  number of white blood cells, red blood cells and platelets. You may be at increased risk for infections and bleeding. nausea and vomiting pain, swelling, redness or irritation at the injection site pain, tingling, numbness in the hands or feet problems with balance, talking, walking trouble passing urine or change in the amount of urine Side effects that usually do not require medical attention (report to your doctor or health care professional if they continue or are bothersome): hair loss loss of appetite metallic taste in the mouth or changes in taste This list may not describe all possible side effects. Call your doctor for medical advice about side effects. You may report side effects to FDA at 1-800-FDA-1088. Where should I keep my medication? This drug is given in a hospital or clinic and will not be stored at home. NOTE: This sheet is a summary. It may not cover all possible information. If you have questions about this medicine, talk to your doctor, pharmacist, or health care provider.  2022 Elsevier/Gold Standard (2007-12-08 14:38:05) Nanoparticle Albumin-Bound Paclitaxel injection What is this medication? NANOPARTICLE ALBUMIN-BOUND PACLITAXEL (Na no PAHR ti kuhl al BYOO muhn-bound PAK li TAX el) is a chemotherapy drug. It targets fast dividing cells, like cancer cells, and causes these cells to die. This medicine is used to treat advanced breast cancer, lung cancer, and pancreatic cancer. This medicine may be used for other purposes; ask your health care provider or pharmacist if you have questions. COMMON BRAND NAME(S): Abraxane What should I tell my care team before I take this medication? They need to know if you have any of these conditions: kidney disease liver disease low blood counts, like low white cell, platelet, or red cell counts lung or breathing disease, like asthma tingling of the fingers or toes, or other nerve disorder an unusual or allergic reaction to  paclitaxel, albumin, other chemotherapy, other medicines, foods, dyes, or preservatives pregnant or trying to get pregnant breast-feeding How should I use this medication? This drug is given as an infusion into a vein. It is administered in a hospital or clinic by a specially trained health care professional. Talk to your pediatrician regarding the use of this medicine in children. Special care may be needed. Overdosage: If you think you have taken too much of this medicine contact a poison control center or emergency room at once. NOTE: This medicine is only for you. Do not share this medicine with others. What if I miss a dose? It is important not to miss your dose. Call your doctor or health care professional if you are unable to keep an appointment. What may interact with this medication? This medicine may interact with the following medications: antiviral medicines for hepatitis, HIV or AIDS certain antibiotics like erythromycin and clarithromycin certain medicines for fungal infections like ketoconazole and itraconazole certain medicines for seizures like carbamazepine, phenobarbital, phenytoin gemfibrozil nefazodone rifampin St. John's wort This list may not describe all possible interactions. Give your health care provider a list of all the medicines, herbs, non-prescription drugs, or dietary supplements you use. Also tell them if you smoke, drink alcohol,  or use illegal drugs. Some items may interact with your medicine. What should I watch for while using this medication? Your condition will be monitored carefully while you are receiving this medicine. You will need important blood work done while you are taking this medicine. This medicine can cause serious allergic reactions. If you experience allergic reactions like skin rash, itching or hives, swelling of the face, lips, or tongue, tell your doctor or health care professional right away. In some cases, you may be given additional  medicines to help with side effects. Follow all directions for their use. This drug may make you feel generally unwell. This is not uncommon, as chemotherapy can affect healthy cells as well as cancer cells. Report any side effects. Continue your course of treatment even though you feel ill unless your doctor tells you to stop. Call your doctor or health care professional for advice if you get a fever, chills or sore throat, or other symptoms of a cold or flu. Do not treat yourself. This drug decreases your body's ability to fight infections. Try to avoid being around people who are sick. This medicine may increase your risk to bruise or bleed. Call your doctor or health care professional if you notice any unusual bleeding. Be careful brushing and flossing your teeth or using a toothpick because you may get an infection or bleed more easily. If you have any dental work done, tell your dentist you are receiving this medicine. Avoid taking products that contain aspirin, acetaminophen, ibuprofen, naproxen, or ketoprofen unless instructed by your doctor. These medicines may hide a fever. Do not become pregnant while taking this medicine or for 6 months after stopping it. Women should inform their doctor if they wish to become pregnant or think they might be pregnant. Men should not father a child while taking this medicine or for 3 months after stopping it. There is a potential for serious side effects to an unborn child. Talk to your health care professional or pharmacist for more information. Do not breast-feed an infant while taking this medicine or for 2 weeks after stopping it. This medicine may interfere with the ability to get pregnant or to father a child. You should talk to your doctor or health care professional if you are concerned about your fertility. What side effects may I notice from receiving this medication? Side effects that you should report to your doctor or health care professional as  soon as possible: allergic reactions like skin rash, itching or hives, swelling of the face, lips, or tongue breathing problems changes in vision fast, irregular heartbeat low blood pressure mouth sores pain, tingling, numbness in the hands or feet signs of decreased platelets or bleeding - bruising, pinpoint red spots on the skin, black, tarry stools, blood in the urine signs of decreased red blood cells - unusually weak or tired, feeling faint or lightheaded, falls signs of infection - fever or chills, cough, sore throat, pain or difficulty passing urine signs and symptoms of liver injury like dark yellow or brown urine; general ill feeling or flu-like symptoms; light-colored stools; loss of appetite; nausea; right upper belly pain; unusually weak or tired; yellowing of the eyes or skin swelling of the ankles, feet, hands unusually slow heartbeat Side effects that usually do not require medical attention (report to your doctor or health care professional if they continue or are bothersome): diarrhea hair loss loss of appetite nausea, vomiting tiredness This list may not describe all possible side effects. Call your doctor  for medical advice about side effects. You may report side effects to FDA at 1-800-FDA-1088. Where should I keep my medication? This drug is given in a hospital or clinic and will not be stored at home. NOTE: This sheet is a summary. It may not cover all possible information. If you have questions about this medicine, talk to your doctor, pharmacist, or health care provider.  2022 Elsevier/Gold Standard (2017-05-06 13:03:45)

## 2021-07-07 ENCOUNTER — Other Ambulatory Visit: Payer: Self-pay | Admitting: Oncology

## 2021-07-07 DIAGNOSIS — D701 Agranulocytosis secondary to cancer chemotherapy: Secondary | ICD-10-CM

## 2021-07-07 DIAGNOSIS — C561 Malignant neoplasm of right ovary: Secondary | ICD-10-CM

## 2021-07-07 DIAGNOSIS — I2699 Other pulmonary embolism without acute cor pulmonale: Secondary | ICD-10-CM

## 2021-07-07 DIAGNOSIS — T451X5A Adverse effect of antineoplastic and immunosuppressive drugs, initial encounter: Secondary | ICD-10-CM

## 2021-07-07 DIAGNOSIS — D649 Anemia, unspecified: Secondary | ICD-10-CM

## 2021-07-07 DIAGNOSIS — Z5111 Encounter for antineoplastic chemotherapy: Secondary | ICD-10-CM

## 2021-07-07 DIAGNOSIS — R180 Malignant ascites: Secondary | ICD-10-CM

## 2021-07-09 ENCOUNTER — Inpatient Hospital Stay: Payer: BC Managed Care – PPO

## 2021-07-09 ENCOUNTER — Other Ambulatory Visit: Payer: Self-pay

## 2021-07-09 ENCOUNTER — Encounter: Payer: Self-pay | Admitting: Oncology

## 2021-07-09 DIAGNOSIS — C561 Malignant neoplasm of right ovary: Secondary | ICD-10-CM | POA: Diagnosis not present

## 2021-07-09 DIAGNOSIS — C569 Malignant neoplasm of unspecified ovary: Secondary | ICD-10-CM

## 2021-07-09 MED ORDER — PEGFILGRASTIM-CBQV 6 MG/0.6ML ~~LOC~~ SOSY
6.0000 mg | PREFILLED_SYRINGE | Freq: Once | SUBCUTANEOUS | Status: AC
  Administered 2021-07-09: 6 mg via SUBCUTANEOUS
  Filled 2021-07-09: qty 0.6

## 2021-07-09 NOTE — Telephone Encounter (Signed)
Revealed negative genetic testing.   We discussed that we do not know why she has cancer or why there is cancer in the family. It could be due to a different gene that we are not testing, or something our current technology cannot pick up.  It will be important for her to keep in contact with genetics to learn if additional testing may be needed in the future. HRD testing (somatic testing) was also negative.

## 2021-07-10 ENCOUNTER — Encounter: Payer: Self-pay | Admitting: Licensed Clinical Social Worker

## 2021-07-10 ENCOUNTER — Ambulatory Visit: Payer: Self-pay | Admitting: Licensed Clinical Social Worker

## 2021-07-10 DIAGNOSIS — C561 Malignant neoplasm of right ovary: Secondary | ICD-10-CM

## 2021-07-10 DIAGNOSIS — Z1379 Encounter for other screening for genetic and chromosomal anomalies: Secondary | ICD-10-CM

## 2021-07-10 DIAGNOSIS — Z803 Family history of malignant neoplasm of breast: Secondary | ICD-10-CM

## 2021-07-10 NOTE — Progress Notes (Signed)
HPI:  Ms. Levitz was previously seen in the [facility] due to a personal and family history of cancer and concerns regarding a hereditary predisposition to cancer. Please refer to our prior cancer genetics clinic note for more information regarding our discussion, assessment and recommendations, at the time. Ms. Collings recent genetic test results were disclosed to her, as were recommendations warranted by these results. These results and recommendations are discussed in more detail below.  CANCER HISTORY:  Oncology History  Ovarian cancer ([place])  04/15/2021 Initial Diagnosis   Ovarian cancer ([place])   04/18/2021 Cancer Staging   Staging form: Ovary, Fallopian Tube, and Primary Peritoneal Carcinoma, AJCC 8th Edition - Clinical stage from 04/18/2021: FIGO Stage II, calculated as Stage Unknown (cT2, cNX, cM0) - Signed by Earlie Server, MD on 04/19/2021 Stage prefix: Initial diagnosis    04/19/2021 -  Chemotherapy   Patient is on Treatment Plan : OVARIAN Carboplatin (AUC 6) / Paclitaxel (175) q21d x 6 cycles      Genetic Testing   Negative genetic testing. No pathogenic variants identified on the [facility] panel. The report date is 06/25/2021.  The [facility] gene panel offered by [employer] includes sequencing and deletion/duplication testing of the following 47 genes:  APC, ATM, AXIN2, BAP1, BARD1, BMPR1A, BRIP1, BRCA1, BRCA2, CDH1, CDK4, CDKN2A, CHEK2, CTNNA1, FH, FLCN, HOXB13 (seq only), MEN1, MET, MLH1, MSH2, MSH3 (excluding repetitive portions of exon 1), MSH6, MUTYH, NTHL1, PALB2, PMS2, PTEN, RAD51C, RAD51D, SDHA, SDHB, SDHC, SDHD, SMAD4, STK11, TP53, TSC1, TSC2, VHL.  [facility] CDx HRD was also negative.      FAMILY HISTORY:  We obtained a detailed, 4-generation family history.  Significant diagnoses are listed below: Family History  Problem Relation Age of Onset   Breast cancer Mother 38   Breast cancer Maternal Aunt        d. under 79      Ms. Khachatryan has 2 daughters, 17 and 51, no cancers. She does not have siblings.   Ms. Riedesel mother had breast cancer at 48, again in her 69s, and died at 26. Patient had 1 maternal aunt. She also had breast cancer and died at a young age, under 59. Patient has 2 maternal cousins, neither have had cancer. Maternal grandmother died at 51. Patient does not have information about grandfather, she also knows he had other children (patient's half-siblings) but doesn't have information about them.   Ms. Suppa father died at 44, he was an only child. Paternal grandmother died in her 74s-90s, grandfather died in his 58s-60s.    Ms. Gaskins is unaware of previous family history of genetic testing for hereditary cancer risks. Patient's maternal ancestors are of [place] descent, and paternal ancestors are of [place] descent. There is no reported Ashkenazi Jewish ancestry. There is no known consanguinity.    GENETIC TEST RESULTS: Genetic testing reported out on 06/25/2021 through the [facility] cancer panel found no pathogenic mutations.    Negative genetic testing. No pathogenic variants identified on the [facility] panel. The report date is 06/25/2021.  The [facility] gene panel offered by [employer] includes sequencing and deletion/duplication testing of the following 47 genes:  APC, ATM, AXIN2, BAP1, BARD1, BMPR1A, BRIP1, BRCA1, BRCA2, CDH1, CDK4, CDKN2A, CHEK2, CTNNA1, FH, FLCN, HOXB13 (seq only), MEN1, MET, MLH1, MSH2, MSH3 (excluding repetitive portions of exon 1), MSH6, MUTYH, NTHL1, PALB2, PMS2, PTEN, RAD51C, RAD51D, SDHA, SDHB, SDHC, SDHD, SMAD4, STK11, TP53, TSC1, TSC2, VHL.  [facility] CDx HRD was also  negative.      The test report has been scanned into EPIC and is located under the Molecular Pathology section of the Results Review tab.  A portion of the result report is included below for reference.       We discussed that because current genetic  testing is not perfect, it is possible there may be a gene mutation in one of these genes that current testing cannot detect, but that chance is small.  There could be another gene that has not yet been discovered, or that we have not yet tested, that is responsible for the cancer diagnoses in the family. It is also possible there is a hereditary cause for the cancer in the family that Ms. Christenson did not inherit and therefore was not identified in her testing.  Therefore, it is important to remain in touch with cancer genetics in the future so that we can continue to offer Ms. Rogel the most up to date genetic testing.   ADDITIONAL GENETIC TESTING: We discussed with Ms. Obarr that her genetic testing was fairly extensive.  If there are genes identified to increase cancer risk that can be analyzed in the future, we would be happy to discuss and coordinate this testing at that time.    CANCER SCREENING RECOMMENDATIONS: Ms. Olejnik test result is considered negative (normal).  This means that we have not identified a hereditary cause for her  personal and family history of cancer at this time. Most cancers happen by chance and this negative test suggests that her cancer may fall into this category.    While reassuring, this does not definitively rule out a hereditary predisposition to cancer. It is still possible that there could be genetic mutations that are undetectable by current technology. There could be genetic mutations in genes that have not been tested or identified to increase cancer risk.  Therefore, it is recommended she continue to follow the cancer management and screening guidelines provided by her oncology and primary healthcare provider.   An individual's cancer risk and medical management are not determined by genetic test results alone. Overall cancer risk assessment incorporates additional factors, including personal medical history, family history, and any available genetic  information that may result in a personalized plan for cancer prevention and surveillance.  RECOMMENDATIONS FOR FAMILY MEMBERS:  Relatives in this family might be at some increased risk of developing cancer, over the general population risk, simply due to the family history of cancer.  We recommended female relatives in this family have a yearly mammogram beginning at age 66, or 49 years younger than the earliest onset of cancer, an annual clinical breast exam, and perform monthly breast self-exams. Female relatives in this family should also have a gynecological exam as recommended by their primary provider.  All family members should be referred for colonoscopy starting at age 26.    It is also possible there is a hereditary cause for the cancer in Ms. Mcginty's family that she did not inherit and therefore was not identified in her.  Based on Ms. Halbig's family history, we recommended maternal relatives have genetic counseling and testing. Ms. Linville will let us know if we can be of any assistance in coordinating genetic counseling and/or testing for these family members.  FOLLOW-UP: Lastly, we discussed with Ms. Dunleavy that cancer genetics is a rapidly advancing field and it is possible that new genetic tests will be appropriate for her and/or her family members in the future. We encouraged her to  remain in contact with cancer genetics on an annual basis so we can update her personal and family histories and let her know of advances in cancer genetics that may benefit this family.   Our contact number was provided. Ms. Byron questions were answered to her satisfaction, and she knows she is welcome to call us at anytime with additional questions or concerns.   Faith Rogue, MS, [facility] Genetic Counselor [email or web address] Phone: [phone]

## 2021-07-11 ENCOUNTER — Other Ambulatory Visit: Payer: Self-pay

## 2021-07-11 ENCOUNTER — Ambulatory Visit: Payer: BC Managed Care – PPO | Admitting: Physical Therapy

## 2021-07-11 DIAGNOSIS — R278 Other lack of coordination: Secondary | ICD-10-CM

## 2021-07-11 DIAGNOSIS — R2689 Other abnormalities of gait and mobility: Secondary | ICD-10-CM | POA: Diagnosis not present

## 2021-07-11 DIAGNOSIS — C561 Malignant neoplasm of right ovary: Secondary | ICD-10-CM

## 2021-07-11 DIAGNOSIS — M6281 Muscle weakness (generalized): Secondary | ICD-10-CM

## 2021-07-11 NOTE — Patient Instructions (Signed)
°  Deep core level 1-2 ( handout) ° ° ° ° °

## 2021-07-11 NOTE — Therapy (Signed)
[place] MAIN [facility] SERVICES [address] [place], [place], [address] Phone: [phone]   Fax:  [phone]  Physical Therapy Treatment  Patient Details  Name: Brittany Montoya MRN: [id number] Date of Birth: [birth year] Referring Provider (PT): Verlon Au, NP   Encounter Date: 07/11/2021   PT End of Session - 07/11/21 1248     Visit Number 3    Number of Visits 10    Date for PT Re-Evaluation 09/06/21    PT Start Time 0900    PT Stop Time 0955    PT Time Calculation (min) 55 min    Equipment Utilized During Treatment Gait belt    Activity Tolerance No increased pain;Patient tolerated treatment well    Behavior During Therapy [facility] for tasks assessed/performed             Past Medical History:  Diagnosis Date   Anxiety    Arthritis    Ascites    Cirrhosis of liver ([place])    DDD (degenerative disc disease), cervical    Depression    Family history of breast cancer    GERD (gastroesophageal reflux disease)    Glaucoma    Headache    migraines   Hypertension    Ovarian cancer ([place]) 04/15/2021   PONV (postoperative nausea and vomiting)    Pulmonary embolism ([place])    Sleep apnea    Venous stasis     Past Surgical History:  Procedure Laterality Date   ABDOMINAL HYSTERECTOMY N/A 2008   may have had left ovary removed   [place]     2012, 2015, 2020   COLONOSCOPY N/A 06/29/2021   Procedure: COLONOSCOPY;  Surgeon: Toledo, Benay Pike, MD;  Location: [facility] ENDOSCOPY;  Service: Gastroenterology;  Laterality: N/A;  DOCTORS ARE IN AGREEMENT THAT TOLEDO CAN DO THIS PROCEDURE IN RUSSO'S BLOCK   ESOPHAGOGASTRODUODENOSCOPY N/A 06/29/2021   Procedure: ESOPHAGOGASTRODUODENOSCOPY (EGD);  Surgeon: Toledo, Benay Pike, MD;  Location: [facility] ENDOSCOPY;  Service: Gastroenterology;  Laterality: N/A;   PARACENTESIS  04/20/2021   PILONIDAL CYST / SINUS EXCISION     PORTACATH  PLACEMENT Right 06/01/2021   Procedure: INSERTION PORT-A-CATH;  Surgeon: Herbert Pun, MD;  Location: [facility] ORS;  Service: General;  Laterality: Right;    There were no vitals filed for this visit.   Subjective Assessment - 07/11/21 1250     Subjective Pt reported her husband is noticing her walking is better. she is trying to keep up walking and the clam shell exercises                [facility] PT Assessment - 07/11/21 1247       Coordination   Coordination and Movement Description Exhaling through mouth     Ambulation/Gait   Gait Comments more upright posture                           [facility] Adult PT Treatment/Exercise - 07/11/21 1143       Neuro Re-ed    Neuro Re-ed Details  cued for oblique  scapulothoracic strengthening, deep core level series      Exercises   Other Exercises  oblique  scapulothoracic strengthening, deep core level series                          PT Long Term  Goals - 06/28/21 1616       PT LONG TERM GOAL #1   Title Pt will demo increased gait speed to > 1.0 m/s  and more reciprocal gait pattern in order to ambulate safely and have  less LBP    Baseline 0.8 m/s, decreased RLE stance phase, slight L trunk lean    Time 8    Period Weeks    Status New    Target Date 08/23/21      PT LONG TERM GOAL #2   Title Pt will be IND with HEP for strength and conditioning to perform ADLs    Time 8    Period Weeks    Status New    Target Date 09/06/21      PT LONG TERM GOAL #3   Title Pt will increase FOTO lumbar score from 56 to > 67 pts    Baseline 56pts    Period Weeks    Status New    Target Date 09/06/21      PT LONG TERM GOAL #4   Title Pt will demo proper deep core exercises level 1-2 to improve posture and IAP system to minimize urinary incontinence and pelvic pain    Time 4    Period Weeks    Status New    Target Date 07/26/21      PT LONG TERM GOAL #5   Title Pt will demo levelled iliac crest,  shoulders, and less dowagers hump . forward head posture across 2 weeks to progress to deep core HEP    Time 2    Period Weeks    Status New    Target Date 07/12/21      Additional Long Term Goals   Additional Long Term Goals --      PT LONG TERM GOAL #6   Title Pt will demo faster 5 times Sit to stand to demo increased functional strength and minimize risk for falls    Baseline 24 sec 5xSTS,  no UE support    Time 8    Period Weeks    Status New    Target Date 08/23/21      PT LONG TERM GOAL #7   Title PT and pt will communicate with PCP / MD re: pt's discontinued use of CPAP, pt to benefit from updated sleep study and her increased risk for OSA  with nocturia, snoring, and daytime sleepiness    Time 2    Period Weeks    Status New    Target Date 07/12/21                   Plan - 07/11/21 1248     Clinical Impression Statement Pt demo'd improved gait pattern and reports her walking feels better.  Progressed to deep core coordination which pt demo'd with cues for breathing through the nose. Progressed to deep core level 2 with minor cues.   Added oblique mm strengthening and scapulothoracic strengthening with red band but did not add to HEP today. Plan to add at next session.   Pt continues to benefit from skilled PT     Personal Factors and Comorbidities Comorbidity 3+    Comorbidities currently undergoing chemo for Stage III ovarian CA    Examination-Activity Limitations Continence;Toileting    Stability/Clinical Decision Making Evolving/Moderate complexity    Rehab Potential Good    PT Frequency 1x / week    PT Duration Other (comment)    PT Treatment/Interventions Neuromuscular re-education;Therapeutic exercise;Therapeutic  activities;Functional mobility training;Patient/family education;Manual techniques;Balance training;Moist Heat;Stair training;ADLs/Self Care Home Management;Taping;Scar mobilization    Consulted and Agree with Plan of Care Patient              Patient will benefit from skilled therapeutic intervention in order to improve the following deficits and impairments:  Decreased mobility, Decreased strength, Impaired sensation, Postural dysfunction, Decreased scar mobility, Decreased balance, Decreased activity tolerance, Hypomobility, Improper body mechanics, Pain, Decreased endurance, Abnormal gait, Decreased coordination, Difficulty walking, Decreased safety awareness, Cardiopulmonary status limiting activity, Increased muscle spasms, Impaired perceived functional ability, Decreased range of motion  Visit Diagnosis: Malignant neoplasm of right ovary (HCC)  Other abnormalities of gait and mobility  Other lack of coordination  Muscle weakness (generalized)     Problem List Patient Active Problem List   Diagnosis Date Noted   Genetic testing 07/10/2021   Symptomatic anemia 07/05/2021   Chemotherapy induced neutropenia ([place]) 06/15/2021   Infusion reaction 05/10/2021   Family history of breast cancer 04/27/2021   Goals of care, counseling/discussion 04/19/2021   Pulmonary embolus ([place]) 04/19/2021   Encounter for antineoplastic chemotherapy 04/19/2021   Anxiety 04/19/2021   Ovarian cancer ([place]) 04/15/2021   Malignant ascites 04/11/2021   Other cirrhosis of liver ([place]) 04/11/2021   DDD (degenerative disc disease), cervical 03/02/2019   Glaucoma (increased eye pressure) 03/02/2019   Venous stasis 03/02/2019   Facet arthritis of cervical region 04/01/2016   Incomplete tear of left rotator cuff 01/15/2016   Rotator cuff tendinitis, left 01/15/2016   Essential hypertension 10/02/2015   Obstructive sleep apnea syndrome 10/02/2015   Recurrent major depressive disorder, in full remission ([place]) 10/02/2015   Contusion of left knee 10/17/2014    Jerl Mina, PT 07/11/2021, 12:50 PM  [place] [facility] MAIN [facility] SERVICES [address] [place], [place], [address] Phone: [phone]    Fax:  [phone]  Name: Coni Homesley MRN: [id number] Date of Birth: [birth year]

## 2021-07-12 ENCOUNTER — Ambulatory Visit: Payer: BC Managed Care – PPO

## 2021-07-12 ENCOUNTER — Other Ambulatory Visit: Payer: BC Managed Care – PPO

## 2021-07-12 ENCOUNTER — Ambulatory Visit: Payer: BC Managed Care – PPO | Admitting: Oncology

## 2021-07-12 ENCOUNTER — Encounter: Payer: BC Managed Care – PPO | Admitting: Physical Therapy

## 2021-07-16 ENCOUNTER — Inpatient Hospital Stay (HOSPITAL_BASED_OUTPATIENT_CLINIC_OR_DEPARTMENT_OTHER): Payer: BC Managed Care – PPO | Admitting: Oncology

## 2021-07-16 ENCOUNTER — Other Ambulatory Visit: Payer: Self-pay

## 2021-07-16 ENCOUNTER — Inpatient Hospital Stay: Payer: BC Managed Care – PPO

## 2021-07-16 VITALS — BP 135/84 | HR 96 | Temp 98.4°F | Wt 186.6 lb

## 2021-07-16 DIAGNOSIS — C561 Malignant neoplasm of right ovary: Secondary | ICD-10-CM | POA: Diagnosis not present

## 2021-07-16 DIAGNOSIS — W19XXXA Unspecified fall, initial encounter: Secondary | ICD-10-CM

## 2021-07-16 LAB — COMPREHENSIVE METABOLIC PANEL
ALT: 28 U/L (ref 0–44)
AST: 34 U/L (ref 15–41)
Albumin: 3.3 g/dL — ABNORMAL LOW (ref 3.5–5.0)
Alkaline Phosphatase: 184 U/L — ABNORMAL HIGH (ref 38–126)
Anion gap: 9 (ref 5–15)
BUN: 7 mg/dL — ABNORMAL LOW (ref 8–23)
CO2: 26 mmol/L (ref 22–32)
Calcium: 9 mg/dL (ref 8.9–10.3)
Chloride: 98 mmol/L (ref 98–111)
Creatinine, Ser: 0.8 mg/dL (ref 0.44–1.00)
GFR, Estimated: >60 mL/min (ref >60)
Glucose, Bld: 106 mg/dL — ABNORMAL HIGH (ref 70–99)
Potassium: 4.3 mmol/L (ref 3.5–5.1)
Sodium: 133 mmol/L — ABNORMAL LOW (ref 135–145)
Total Bilirubin: 0.4 mg/dL (ref 0.3–1.2)
Total Protein: 6.6 g/dL (ref 6.5–8.1)

## 2021-07-16 LAB — CBC WITH DIFFERENTIAL/PLATELET
Abs Immature Granulocytes: 0.26 10*3/uL — ABNORMAL HIGH (ref 0.00–0.07)
Basophils Absolute: 0.1 10*3/uL (ref 0.0–0.1)
Basophils Relative: 0 %
Eosinophils Absolute: 0.1 10*3/uL (ref 0.0–0.5)
Eosinophils Relative: 0 %
HCT: 28.1 % — ABNORMAL LOW (ref 36.0–46.0)
Hemoglobin: 9.3 g/dL — ABNORMAL LOW (ref 12.0–15.0)
Immature Granulocytes: 1 %
Lymphocytes Relative: 7 %
Lymphs Abs: 1.6 10*3/uL (ref 0.7–4.0)
MCH: 33.3 pg (ref 26.0–34.0)
MCHC: 33.1 g/dL (ref 30.0–36.0)
MCV: 100.7 fL — ABNORMAL HIGH (ref 80.0–100.0)
Monocytes Absolute: 1.7 10*3/uL — ABNORMAL HIGH (ref 0.1–1.0)
Monocytes Relative: 7 %
Neutro Abs: 19.4 10*3/uL — ABNORMAL HIGH (ref 1.7–7.7)
Neutrophils Relative %: 85 %
Platelets: 163 10*3/uL (ref 150–400)
RBC: 2.79 MIL/uL — ABNORMAL LOW (ref 3.87–5.11)
RDW: 21 % — ABNORMAL HIGH (ref 11.5–15.5)
Smear Review: NORMAL
WBC: 23.1 10*3/uL — ABNORMAL HIGH (ref 4.0–10.5)
nRBC: 0 % (ref 0.0–0.2)

## 2021-07-16 NOTE — Progress Notes (Signed)
Hematology/Oncology progress note    Patient Care Team: Adin Hector, MD as PCP - General (Internal Medicine) Earlie Server, MD as Consulting Physician (Hematology and Oncology) Clent Jacks, RN as Oncology Nurse Navigator  REFERRING PROVIDER: Adin Hector, MD  CHIEF COMPLAINTS/REASON FOR VISIT:  Malignant ascites, ovarian neoplasm, pulmonary embolism  HISTORY OF PRESENTING ILLNESS:  Brittany Montoya is a 60 year old female with past medical history significant for hypertension, PE, sleep apnea, cirrhosis of the liver, anxiety, anemia who was recently diagnosed with ovarian cancer.  She initially presented with abdominal distention and bloating along with nausea and constipation for several months.  Imaging showed a large mass suspicious for primary ovarian malignancy.  There was some evidence of peritoneal spread and moderate associated ascites.  She underwent a paracentesis on 04/09/2021 where 6.3 L of yellow fluid was removed.  Cytology revealed ovarian cancer.  She has been seen by gynecology oncology and plan was for Carbo plus Abraxane which she last received on 07/06/2021 with Udenyca followed by debulking surgery. This has been pushed back d/t allergic reaction with cycle 2 and partial response of chemotherapy.   She had a repeat paracentesis on 06/01/2021 where 4 L of fluid was removed and again on 06/26/2021 (2.4 L).  Repeat imaging on 06/25/2021 showed solid and cystic mass of right ovary which was slightly smaller in size.  Findings were consistent with treatment response.  No evidence of metastatic disease in the chest.  INTERVAL HISTORY She presents back today for labs and to assess tolerance of previous chemo.  She states she is doing fair.  Has 2 to 3 days following chemo feeling poorly but then she recovers.  Appetite is stable.  Reports abdominal distention and bloating for the last couple days.  It has caused some shortness of breath.  Had a fall on Saturday evening  in the shower landing on her left buttocks.  She did not injure her head or neck.  Reports some bruising to her left shoulder. Had a BM this morning. Uses Miralax.   Review of Systems  Constitutional:  Positive for fatigue.  Gastrointestinal:  Positive for abdominal distention and constipation.  Musculoskeletal:  Positive for arthralgias and back pain (fall).  Neurological:  Positive for dizziness.   MEDICAL HISTORY:  Past Medical History:  Diagnosis Date   Anxiety    Arthritis    Ascites    Cirrhosis of liver ([place])    DDD (degenerative disc disease), cervical    Depression    Family history of breast cancer    GERD (gastroesophageal reflux disease)    Glaucoma    Headache    migraines   Hypertension    Ovarian cancer ([place]) 04/15/2021   PONV (postoperative nausea and vomiting)    Pulmonary embolism ([place])    Sleep apnea    Venous stasis     SURGICAL HISTORY: Past Surgical History:  Procedure Laterality Date   ABDOMINAL HYSTERECTOMY N/A 2008   may have had left ovary removed   [place]     2012, 2015, 2020   COLONOSCOPY N/A 06/29/2021   Procedure: COLONOSCOPY;  Surgeon: Toledo, Benay Pike, MD;  Location: [facility] ENDOSCOPY;  Service: Gastroenterology;  Laterality: N/A;  DOCTORS ARE IN AGREEMENT THAT TOLEDO CAN DO THIS PROCEDURE IN RUSSO'S BLOCK   ESOPHAGOGASTRODUODENOSCOPY N/A 06/29/2021   Procedure: ESOPHAGOGASTRODUODENOSCOPY (EGD);  Surgeon: Toledo, Benay Pike, MD;  Location: [facility] ENDOSCOPY;  Service: Gastroenterology;  Laterality: N/A;   PARACENTESIS  04/20/2021   PILONIDAL CYST / SINUS EXCISION     PORTACATH PLACEMENT Right 06/01/2021   Procedure: INSERTION PORT-A-CATH;  Surgeon: Herbert Pun, MD;  Location: [facility] ORS;  Service: General;  Laterality: Right;    SOCIAL HISTORY: Social History   Socioeconomic History   Marital status: Married    Spouse name: Brittany Montoya   Number of children: 2   Years of  education: Not on file   Highest education level: Not on file  Occupational History   Not on file  Tobacco Use   Smoking status: Never   Smokeless tobacco: Never  Vaping Use   Vaping Use: Never used  Substance and Sexual Activity   Alcohol use: No   Drug use: No   Sexual activity: Not Currently    Birth control/protection: Surgical  Other Topics Concern   Not on file  Social History Narrative   Not on file   Social Determinants of Health   Financial Resource Strain: Not on file  Food Insecurity: Not on file  Transportation Needs: Not on file  Physical Activity: Not on file  Stress: Not on file  Social Connections: Not on file  Intimate Partner Violence: Not on file    FAMILY HISTORY: Family History  Problem Relation Age of Onset   Breast cancer Mother 62   Breast cancer Maternal Aunt        d. under 100    ALLERGIES:  is allergic to paclitaxel, amlodipine, and penicillins.  MEDICATIONS:  Current Outpatient Medications  Medication Sig Dispense Refill   acetaminophen (TYLENOL) 500 MG tablet Take 1,000 mg by mouth at bedtime.     albuterol (VENTOLIN HFA) 108 (90 Base) MCG/ACT inhaler Inhale 1-2 puffs into the lungs every 6 (six) hours as needed for wheezing or shortness of breath.     apixaban (ELIQUIS) 5 MG TABS tablet Take 1 tablet (5 mg total) by mouth 2 (two) times daily. 60 tablet 0   clonazePAM (KLONOPIN) 0.5 MG tablet Take 1 tablet (0.5 mg total) by mouth daily as needed for anxiety. 30 tablet 0   dexamethasone (DECADRON) 4 MG tablet Take 2 tablets (8 mg total) by mouth See admin instructions. Take 8mg  daily for 2 days prior to your chemotherapy 30 tablet 0   hydrochlorothiazide (HYDRODIURIL) 12.5 MG tablet Take 12.5 mg by mouth daily.     latanoprost (XALATAN) 0.005 % ophthalmic solution Place 1 drop into both eyes at bedtime.     lidocaine-prilocaine (EMLA) cream Apply to affected area once (Patient taking differently: Apply 1 application topically daily as  needed (pain). Apply to affected area once) 30 g 3   losartan (COZAAR) 50 MG tablet Take 50 mg by mouth daily.     Multiple Vitamin (MULTIVITAMIN WITH MINERALS) TABS tablet Take 1 tablet by mouth daily.     ondansetron (ZOFRAN) 8 MG tablet Take 1 tablet (8 mg total) by mouth 2 (two) times daily as needed for refractory nausea / vomiting. Start on day 3 after carboplatin chemo. 30 tablet 1   potassium chloride SA (KLOR-CON) 20 MEQ tablet Take 1 tablet (20 mEq total) by mouth 2 (two) times daily. 60 tablet 0   prochlorperazine (COMPAZINE) 10 MG tablet Take 1 tablet (10 mg total) by mouth every 6 (six) hours as needed (Nausea or vomiting). 30 tablet 1   timolol (TIMOPTIC) 0.5 % ophthalmic solution Place 1 drop into both eyes 2 (two) times daily.  traMADol (ULTRAM) 50 MG tablet Take 50 mg by mouth every 6 (six) hours as needed.     triamcinolone cream (KENALOG) 0.1 % Apply 1 application topically daily as needed (irritation).     No current facility-administered medications for this visit.   Facility-Administered Medications Ordered in Other Visits  Medication Dose Route Frequency Provider Last Rate Last Admin   LORazepam (ATIVAN) injection 0.5 mg  0.5 mg Intravenous Once PRN Earlie Server, MD         PHYSICAL EXAMINATION: ECOG PERFORMANCE STATUS: 1 - Symptomatic but completely ambulatory There were no vitals filed for this visit.   There were no vitals filed for this visit.   Physical Exam Constitutional:      Appearance: She is obese.  Abdominal:     General: There is distension.  Musculoskeletal:        General: Tenderness (left side post fall) present.    LABORATORY DATA:  I have reviewed the data as listed Lab Results  Component Value Date   WBC 18.2 (H) 07/05/2021   HGB 8.1 (L) 07/05/2021   HCT 24.6 (L) 07/05/2021   MCV 98.4 07/05/2021   PLT 724 (H) 07/05/2021   Recent Labs    05/31/21 0811 06/07/21 0814 07/05/21 0816  NA 132* 129* 134*  K 3.2* 3.6 3.4*  CL 99 97*  101  CO2 23 23 24   GLUCOSE 133* 106* 134*  BUN 13 16 20   CREATININE 0.90 0.93 0.88  CALCIUM 9.5 9.7 9.7  GFRNONAA >60 >60 >60  PROT 7.0 6.2* 6.8  ALBUMIN 3.3* 2.9* 3.2*  AST 37 24 32  ALT 20 19 23   ALKPHOS 105 107 128*  BILITOT 0.4 0.4 0.6    Iron/TIBC/Ferritin/ %Sat No results found for: IRON, TIBC, FERRITIN, IRONPCTSAT    RADIOGRAPHIC STUDIES: I have personally reviewed the radiological images as listed and agreed with the findings in the report. CT CHEST ABDOMEN PELVIS W CONTRAST  Result Date: 06/26/2021 CLINICAL DATA:  Ovarian cancer restaging, ongoing chemotherapy, assess treatment response EXAM: CT CHEST, ABDOMEN, AND PELVIS WITH CONTRAST TECHNIQUE: Multidetector CT imaging of the chest, abdomen and pelvis was performed following the standard protocol during bolus administration of intravenous contrast. CONTRAST:  141mL OMNIPAQUE IOHEXOL 350 MG/ML SOLN, additional oral enteric contrast COMPARISON:  CT abdomen pelvis, 03/23/2021 FINDINGS: CT CHEST FINDINGS Cardiovascular: Right chest port catheter. Normal heart size. No pericardial effusion. Mediastinum/Nodes: No enlarged mediastinal, hilar, or axillary lymph nodes. Thyroid gland, trachea, and esophagus demonstrate no significant findings. Lungs/Pleura: Lungs are clear. No pleural effusion or pneumothorax. Musculoskeletal: No chest wall mass or suspicious bone lesions identified. CT ABDOMEN PELVIS FINDINGS Hepatobiliary: No focal liver abnormality is seen. Status post cholecystectomy. No biliary dilatation. Pancreas: Unremarkable. No pancreatic ductal dilatation or surrounding inflammatory changes. Spleen: Normal in size without significant abnormality. Adrenals/Urinary Tract: Adrenal glands are unremarkable. Kidneys are normal, without renal calculi, solid lesion, or hydronephrosis. Bladder is unremarkable. Stomach/Bowel: Stomach is within normal limits. Appendix not clearly visualized and may be surgically absent. No evidence of bowel  wall thickening, distention, or inflammatory changes. Vascular/Lymphatic: No significant vascular findings are present. No enlarged abdominal or pelvic lymph nodes. Reproductive: Redemonstrated large, mixed solid and cystic mass of the right ovary, which is slightly diminished in size, measuring 18.1 x 12.7 x 12.8 cm, previously 18.1 x 14.1 x 16 cm (series 2, image 83, series 4, image 74). Status post hysterectomy. Other: No abdominal wall hernia or abnormality. Moderate volume ascites throughout the abdomen and pelvis, similar  in volume to prior. Peritoneal and omental thickening and nodularity is improved compared to prior examination, although still present (e.g. Series 2, image 58, 97). Musculoskeletal: No acute or significant osseous findings. IMPRESSION: 1. Redemonstrated large, mixed solid and cystic mass of the right ovary, which is slightly diminished in size. 2. Peritoneal and omental thickening and nodularity is improved compared to prior examination, although still present. 3. Findings are consistent with treatment response of primary ovarian malignancy and peritoneal and omental metastatic disease. 4. Moderate volume ascites throughout the abdomen and pelvis, similar in volume to prior. 5. No evidence of metastatic disease in the chest. Electronically Signed   By: Delanna Ahmadi M.D.   On: 06/26/2021 16:57   US Paracentesis  Result Date: 06/26/2021 INDICATION: History of ovarian cancer with recurrent ascites request received for therapeutic paracentesis. EXAM: ULTRASOUND GUIDED THERAPEUTIC PARACENTESIS MEDICATIONS: Local 1% lidocaine only. COMPLICATIONS: None immediate. PROCEDURE: Informed written consent was obtained from the patient after a discussion of the risks, benefits and alternatives to treatment. A timeout was performed prior to the initiation of the procedure. Initial ultrasound scanning demonstrates a moderate amount of ascites within the right lower abdominal quadrant. The right lower  abdomen was prepped and draped in the usual sterile fashion. 1% lidocaine was used for local anesthesia. Following this, a 19 gauge, 10-cm, Yueh catheter was introduced. An ultrasound image was saved for documentation purposes. The paracentesis was performed. The catheter was removed and a dressing was applied. The patient tolerated the procedure well without immediate post procedural complication. FINDINGS: A total of approximately 2.4 L of clear yellow fluid was removed. IMPRESSION: Successful ultrasound-guided paracentesis yielding 2.4 liters of peritoneal fluid. Read By: Tsosie Billing PA-C Electronically Signed   By: Aletta Edouard M.D.   On: 06/26/2021 16:11      ASSESSMENT & PLAN: Mrs. Lasota presents for routine follow-up for ovarian cancer. No diagnosis found.  Clinically she is doing fair.  She is status post 4 cycles of carbo/Abraxane last given on 07/06/2021 along with Udenyca.  Has 2 to 3 days of feeling poorly following chemotherapy.  Reports taking her narcotic as needed for bone pain and pelvic discomfort.  Feels she may need additional paracentesis.  Has some abdominal bloating and feeling full.  We will get her scheduled for a ultrasound for possible paracentesis in the next couple days.  Labs from today are stable.  Mild hyponatremia at 133.  Hemoglobin 9.3 which is fairly stable for her.  Platelet count 163.  Recent fall-patient fell in the shower on Saturday landing on her coccyx.  Reports pain and bruising.  She is able to bear weight and walk without discomfort.  She has physical therapy tomorrow.  Does not believe she broke anything.  Recommend imaging to rule out fracture if symptoms persist or do not improve.  Standing orders for x-rays sent and patient to get completed if no improvement noted.  History of PE bilateral-continue Eliquis.  Leukocytosis-secondary to [employer].  Continue Claritin and Tylenol.  Anemia-secondary to chemotherapy.  Received 1 unit PRBC recently.   Hemoglobin 9.3 today.  Disposition- Ultrasound-guided paracentesis in the next couple days.  Return to clinic as scheduled next week for next cycle of chemotherapy.  Patient to have x-rays of her coccyx if symptoms do not improve over the next several days.  Patient and daughter are in agreement.  All questions were answered. The patient knows to call the clinic with any problems questions or concerns.  cc Tama High III,  MD   I spent 25 minutes dedicated to the care of this patient (face-to-face and non-face-to-face) on the date of the encounter to include what is described in the assessment and plan.  We spent sufficient time to discuss many aspect of care, questions were answered to patient's satisfaction.  Faythe Casa, NP 07/16/2021 12:03 PM

## 2021-07-17 ENCOUNTER — Ambulatory Visit: Payer: BC Managed Care – PPO | Attending: Nurse Practitioner | Admitting: Physical Therapy

## 2021-07-17 DIAGNOSIS — M6281 Muscle weakness (generalized): Secondary | ICD-10-CM | POA: Insufficient documentation

## 2021-07-17 DIAGNOSIS — R278 Other lack of coordination: Secondary | ICD-10-CM | POA: Insufficient documentation

## 2021-07-17 DIAGNOSIS — R2689 Other abnormalities of gait and mobility: Secondary | ICD-10-CM | POA: Insufficient documentation

## 2021-07-17 DIAGNOSIS — C561 Malignant neoplasm of right ovary: Secondary | ICD-10-CM | POA: Insufficient documentation

## 2021-07-17 NOTE — Telephone Encounter (Signed)
Brittany Baller, do you know that status of the Para?

## 2021-07-18 NOTE — Telephone Encounter (Signed)
Scheduled for 11/7-not sure why it wasn't scheduled sooner  [place]

## 2021-07-18 NOTE — Telephone Encounter (Signed)
I had not heard. I will look into it.   Faythe Casa, NP 07/18/2021 11:46 AM

## 2021-07-19 NOTE — Therapy (Signed)
[place] MAIN [facility] SERVICES [address] [place], [place], [address] Phone: [phone]   Fax:  [phone]  Patient Details  Name: Brittany Montoya MRN: [id number] Date of Birth: 1961/01/08 Referring Provider:  Verlon Au, NP  Encounter Date: 07/17/2021  Withheld PT today. Pt arrived and pt reported she had a fall in her bathroom a few days ago and did not sustain any injuries except for bruises and still feeling sore. Pt reported she has notified her nurse. Pt was explained to apply ice to bruises 20 min at a time and to rest. Screened pelvic girdle alignment and pt demo'd no asymmetries despite antalgic gait.  Plan to resume PT when pt reports she is feeling better. Plan to reassess alignment again next session.   Jerl Mina ,PT, DPT, E-RYT  07/19/2021, 12:00 AM  [place] MAIN [facility] SERVICES [address] [place], [place], [address] Phone: [phone]   Fax:  [phone]

## 2021-07-23 ENCOUNTER — Ambulatory Visit
Admission: RE | Admit: 2021-07-23 | Discharge: 2021-07-23 | Disposition: A | Discharge status: Home / Self Care | Payer: BC Managed Care – PPO | Source: Ambulatory Visit | Attending: Oncology | Admitting: Oncology

## 2021-07-23 ENCOUNTER — Other Ambulatory Visit: Payer: Self-pay

## 2021-07-23 DIAGNOSIS — C561 Malignant neoplasm of right ovary: Secondary | ICD-10-CM | POA: Diagnosis present

## 2021-07-23 NOTE — Procedures (Signed)
PROCEDURE SUMMARY:  Successful US guided paracentesis from RLQ.  Yielded 4.1 L of amber colored fluid.  No immediate complications.  Pt tolerated well.   Specimen was not sent for labs.  EBL < 57mL  Rockney Ghee 07/23/2021 3:18 PM

## 2021-07-25 ENCOUNTER — Ambulatory Visit: Payer: BC Managed Care – PPO | Admitting: Physical Therapy

## 2021-07-25 ENCOUNTER — Other Ambulatory Visit: Payer: Self-pay

## 2021-07-25 DIAGNOSIS — R278 Other lack of coordination: Secondary | ICD-10-CM | POA: Diagnosis present

## 2021-07-25 DIAGNOSIS — C561 Malignant neoplasm of right ovary: Secondary | ICD-10-CM | POA: Diagnosis present

## 2021-07-25 DIAGNOSIS — M6281 Muscle weakness (generalized): Secondary | ICD-10-CM | POA: Diagnosis present

## 2021-07-25 DIAGNOSIS — R2689 Other abnormalities of gait and mobility: Secondary | ICD-10-CM | POA: Diagnosis present

## 2021-07-26 ENCOUNTER — Telehealth: Payer: Self-pay

## 2021-07-26 NOTE — Therapy (Signed)
[place] MAIN [facility] SERVICES [address] [place], [place], [address] Phone: [phone]   Fax:  [phone]  Physical Therapy Treatment  Patient Details  Name: Brittany Montoya MRN: [id number] Date of Birth: 11-06-1960 Referring Provider (PT): Verlon Au, NP   Encounter Date: 07/25/2021   PT End of Session - 07/25/21 1337     Visit Number 4    Number of Visits 10    Date for PT Re-Evaluation 09/06/21    PT Start Time 1310    PT Stop Time 1405    PT Time Calculation (min) 55 min    Equipment Utilized During Treatment Gait belt    Activity Tolerance No increased pain;Patient tolerated treatment well    Behavior During Therapy [facility] for tasks assessed/performed             Past Medical History:  Diagnosis Date   Anxiety    Arthritis    Ascites    Cirrhosis of liver ([place])    DDD (degenerative disc disease), cervical    Depression    Family history of breast cancer    GERD (gastroesophageal reflux disease)    Glaucoma    Headache    migraines   Hypertension    Ovarian cancer ([place]) 04/15/2021   PONV (postoperative nausea and vomiting)    Pulmonary embolism ([place])    Sleep apnea    Venous stasis     Past Surgical History:  Procedure Laterality Date   ABDOMINAL HYSTERECTOMY N/A 2008   may have had left ovary removed   [place]     2012, 2015, 2020   COLONOSCOPY N/A 06/29/2021   Procedure: COLONOSCOPY;  Surgeon: Toledo, Benay Pike, MD;  Location: [facility] ENDOSCOPY;  Service: Gastroenterology;  Laterality: N/A;  DOCTORS ARE IN AGREEMENT THAT TOLEDO CAN DO THIS PROCEDURE IN RUSSO'S BLOCK   ESOPHAGOGASTRODUODENOSCOPY N/A 06/29/2021   Procedure: ESOPHAGOGASTRODUODENOSCOPY (EGD);  Surgeon: Toledo, Benay Pike, MD;  Location: [facility] ENDOSCOPY;  Service: Gastroenterology;  Laterality: N/A;   PARACENTESIS  04/20/2021   PILONIDAL CYST / SINUS EXCISION     PORTACATH  PLACEMENT Right 06/01/2021   Procedure: INSERTION PORT-A-CATH;  Surgeon: Herbert Pun, MD;  Location: [facility] ORS;  Service: General;  Laterality: Right;    There were no vitals filed for this visit.   Subjective Assessment - 07/25/21 1315     Subjective Pt is doing her HEP 2 x day ( clamshells, deep core ). Pt had 4.1 fluids drained from her stomach and she can breathe alot better. Pt still gets out of breath. Pt is feeling better from her falls. She still feels tender at her behind but the bruise is going away and turning purple, greenish yellow.                [facility] PT Assessment - 07/26/21 1658       6 minute walk test results    Endurance additional comments 560 ft , BORG 4/10, Sat O2 100% /100%, HR 108 HR/ 111 HR  pre and post Hx,                           [facility] Adult PT Treatment/Exercise - 07/26/21 1654       Therapeutic Activites    Other Therapeutic Activities explained maintanence of HEP, aerobic conditioning with 6 min walks on levelled ground 2-3  x day , return to pelvic PT after surgery      Neuro Re-ed    Neuro Re-ed Details  cued for HEP technique                          PT Long Term Goals - 07/25/21 1338       PT LONG TERM GOAL #1   Title Pt will demo increased gait speed to > 1.0 m/s  and more reciprocal gait pattern in order to ambulate safely and have  less LBP    Baseline 0.8 m/s, decreased RLE stance phase, slight L trunk lean  ( 07/25/21: 1.02 m/s with no trunk lean with R shoe lift)    Time 8    Period Weeks    Status Achieved      PT LONG TERM GOAL #2   Title Pt will be IND with HEP for strength and conditioning to perform ADLs    Time 8    Period Weeks    Status Achieved      PT LONG TERM GOAL #3   Title Pt will increase FOTO lumbar score from 56 to > 67 pts   ( 07/25/21: 64 pts)    Baseline 56pts    Period Weeks    Status Achieved      PT LONG TERM GOAL #4   Title Pt will demo proper deep core  exercises level 1-2 to improve posture and IAP system to minimize urinary incontinence and pelvic pain    Time 4    Period Weeks    Status Achieved      PT LONG TERM GOAL #5   Title Pt will demo levelled iliac crest, shoulders, and less dowagers hump . forward head posture across 2 weeks to progress to deep core HEP    Time 2    Period Weeks    Status Achieved      PT LONG TERM GOAL #6   Title Pt will demo faster 5 times Sit to stand to demo increased functional strength and minimize risk for falls    Baseline 24 sec 5xSTS,  no UE support   ( 07/25/21: 15 sec no UE support)    Time 8    Period Weeks    Status Achieved      PT LONG TERM GOAL #7   Title PT and pt will communicate with PCP / MD re: pt's discontinued use of CPAP, pt to benefit from updated sleep study and her increased risk for OSA  with nocturia, snoring, and daytime sleepiness    Time 2    Period Weeks    Status On-going                   Plan - 07/25/21 1337     Clinical Impression Statement Pt    Personal Factors and Comorbidities Comorbidity 3+    Comorbidities currently undergoing chemo for Stage III ovarian CA    Examination-Activity Limitations Continence;Toileting    Stability/Clinical Decision Making Evolving/Moderate complexity    Rehab Potential Good    PT Frequency 1x / week    PT Duration Other (comment)    PT Treatment/Interventions Neuromuscular re-education;Therapeutic exercise;Therapeutic activities;Functional mobility training;Patient/family education;Manual techniques;Balance training;Moist Heat;Stair training;ADLs/Self Care Home Management;Taping;Scar mobilization    Consulted and Agree with Plan of Care Patient             Patient will benefit from skilled therapeutic intervention in order to  improve the following deficits and impairments:  Decreased mobility, Decreased strength, Impaired sensation, Postural dysfunction, Decreased scar mobility, Decreased balance, Decreased  activity tolerance, Hypomobility, Improper body mechanics, Pain, Decreased endurance, Abnormal gait, Decreased coordination, Difficulty walking, Decreased safety awareness, Cardiopulmonary status limiting activity, Increased muscle spasms, Impaired perceived functional ability, Decreased range of motion  Visit Diagnosis: Malignant neoplasm of right ovary (HCC)  Other abnormalities of gait and mobility  Other lack of coordination  Muscle weakness (generalized)     Problem List Patient Active Problem List   Diagnosis Date Noted   Genetic testing 07/10/2021   Symptomatic anemia 07/05/2021   Chemotherapy induced neutropenia ([place]) 06/15/2021   Infusion reaction 05/10/2021   Family history of breast cancer 04/27/2021   Goals of care, counseling/discussion 04/19/2021   Pulmonary embolus ([place]) 04/19/2021   Encounter for antineoplastic chemotherapy 04/19/2021   Anxiety 04/19/2021   Ovarian cancer ([place]) 04/15/2021   Malignant ascites 04/11/2021   Other cirrhosis of liver ([place]) 04/11/2021   DDD (degenerative disc disease), cervical 03/02/2019   Glaucoma (increased eye pressure) 03/02/2019   Venous stasis 03/02/2019   Facet arthritis of cervical region 04/01/2016   Incomplete tear of left rotator cuff 01/15/2016   Rotator cuff tendinitis, left 01/15/2016   Essential hypertension 10/02/2015   Obstructive sleep apnea syndrome 10/02/2015   Recurrent major depressive disorder, in full remission ([place]) 10/02/2015   Contusion of left knee 10/17/2014    Jerl Mina, PT 07/26/2021, 5:01 PM  [place] MAIN [facility] SERVICES [address] [place], [place], [address] Phone: [phone]   Fax:  [phone]  Name: Keyry Iracheta MRN: [id number] Date of Birth: [birth year]

## 2021-07-26 NOTE — Therapy (Addendum)
[place] MAIN [facility] SERVICES [address] [place], [place], [address] Phone: [phone]   Fax:  [phone]  Physical Therapy Treatment / Discharge Summary ( Eval to 4th visit)   Patient Details  Name: Brittany Montoya MRN: [id number] Date of Birth: 08/30/1961 Referring Provider (PT): Verlon Au, NP   Encounter Date: 07/25/2021   PT End of Session - 07/25/21 1337     Visit Number 4    Number of Visits 10    Date for PT Re-Evaluation 09/06/21    PT Start Time 1310    PT Stop Time 1405    PT Time Calculation (min) 55 min    Equipment Utilized During Treatment Gait belt    Activity Tolerance No increased pain;Patient tolerated treatment well    Behavior During Therapy [facility] for tasks assessed/performed             Past Medical History:  Diagnosis Date   Anxiety    Arthritis    Ascites    Cirrhosis of liver ([place])    DDD (degenerative disc disease), cervical    Depression    Family history of breast cancer    GERD (gastroesophageal reflux disease)    Glaucoma    Headache    migraines   Hypertension    Ovarian cancer ([place]) 04/15/2021   PONV (postoperative nausea and vomiting)    Pulmonary embolism ([place])    Sleep apnea    Venous stasis     Past Surgical History:  Procedure Laterality Date   ABDOMINAL HYSTERECTOMY N/A 2008   may have had left ovary removed   [place]     2012, 2015, 2020   COLONOSCOPY N/A 06/29/2021   Procedure: COLONOSCOPY;  Surgeon: Toledo, Benay Pike, MD;  Location: [facility] ENDOSCOPY;  Service: Gastroenterology;  Laterality: N/A;  DOCTORS ARE IN AGREEMENT THAT TOLEDO CAN DO THIS PROCEDURE IN RUSSO'S BLOCK   ESOPHAGOGASTRODUODENOSCOPY N/A 06/29/2021   Procedure: ESOPHAGOGASTRODUODENOSCOPY (EGD);  Surgeon: Toledo, Benay Pike, MD;  Location: [facility] ENDOSCOPY;  Service: Gastroenterology;  Laterality: N/A;   PARACENTESIS  04/20/2021    PILONIDAL CYST / SINUS EXCISION     PORTACATH PLACEMENT Right 06/01/2021   Procedure: INSERTION PORT-A-CATH;  Surgeon: Herbert Pun, MD;  Location: [facility] ORS;  Service: General;  Laterality: Right;    There were no vitals filed for this visit.   Subjective Assessment - 07/25/21 1315     Subjective Pt is doing her HEP 2 x day ( clamshells, deep core ). Pt had 4.1 fluids drained from her stomach and she can breathe alot better. Pt still gets out of breath. Pt is feeling better from her falls. She still feels tender at her behind but the bruise is going away and turning purple, greenish yellow.                [facility] PT Assessment - 07/26/21 1658       6 minute walk test results    Endurance additional comments 560 ft , BORG 4/10, Sat O2 100% /100%, HR 108 HR/ 111 HR  pre and post Hx,                           [facility] Adult PT Treatment/Exercise - 07/26/21 1654       Therapeutic Activites    Other Therapeutic Activities explained maintanence of HEP, aerobic  conditioning with 6 min walks on levelled ground 2-3 x day , return to pelvic PT after surgery      Neuro Re-ed    Neuro Re-ed Details  cued for HEP technique              Ther Ex: clamshells, deep core level 1-2, 6 MWT             PT Long Term Goals - 07/25/21 1338       PT LONG TERM GOAL #1   Title Pt will demo increased gait speed to > 1.0 m/s  and more reciprocal gait pattern in order to ambulate safely and have  less LBP    Baseline 0.8 m/s, decreased RLE stance phase, slight L trunk lean  ( 07/25/21: 1.02 m/s with no trunk lean with R shoe lift)    Time 8    Period Weeks    Status Achieved      PT LONG TERM GOAL #2   Title Pt will be IND with HEP for strength and conditioning to perform ADLs    Time 8    Period Weeks    Status Achieved      PT LONG TERM GOAL #3   Title Pt will increase FOTO lumbar score from 56 to > 67 pts   ( 07/25/21: 64 pts)    Baseline 56pts     Period Weeks    Status Achieved      PT LONG TERM GOAL #4   Title Pt will demo proper deep core exercises level 1-2 to improve posture and IAP system to minimize urinary incontinence and pelvic pain    Time 4    Period Weeks    Status Achieved      PT LONG TERM GOAL #5   Title Pt will demo levelled iliac crest, shoulders, and less dowagers hump . forward head posture across 2 weeks to progress to deep core HEP    Time 2    Period Weeks    Status Achieved      PT LONG TERM GOAL #6   Title Pt will demo faster 5 times Sit to stand to demo increased functional strength and minimize risk for falls    Baseline 24 sec 5xSTS,  no UE support   ( 07/25/21: 15 sec no UE support)    Time 8    Period Weeks    Status Achieved      PT LONG TERM GOAL #7   Title PT and pt will communicate with PCP / MD re: pt's discontinued use of CPAP, pt to benefit from updated sleep study and her increased risk for OSA  with nocturia, snoring, and daytime sleepiness    Time 2    Period Weeks    Status Achieved                   Plan - 07/25/21 1337     Clinical Impression Statement Pt has achieved 100% of her goals. Pt 's leg length difference has been addressed with a shoe lift in RLE  which has helped to improve gait pattern, decrease LBP,  and improve her endurance to walk longer distances. Pt 's five to sit to stand measurement has improved with greater speed from 24 sec to 15 sec which indicates less risk for falls. Pt's deep core coordination has improved and pt understand to continue with HEP to increase deep core strength and hip abduction.    Pt was recommended  to f/u with PCP for updated sleep study to rule in/out OSA 2/2 to increased risk factors that include nocturia, snoring, and daytime sleepiness. Therapist will also communicate with PCP re: this issue.  Pt was recommended to return to Pelvic PT in the future as needed , especially if she undergoes surgery as part of her CA Tx.  Pt is  being d/c at this time.     Personal Factors and Comorbidities Comorbidity 3+    Comorbidities currently undergoing chemo for Stage III ovarian CA    Examination-Activity Limitations Continence;Toileting    Stability/Clinical Decision Making Evolving/Moderate complexity    Rehab Potential Good    PT Frequency 1x / week    PT Duration Other (comment)    PT Treatment/Interventions Neuromuscular re-education;Therapeutic exercise;Therapeutic activities;Functional mobility training;Patient/family education;Manual techniques;Balance training;Moist Heat;Stair training;ADLs/Self Care Home Management;Taping;Scar mobilization    Consulted and Agree with Plan of Care Patient             Patient will benefit from skilled therapeutic intervention in order to improve the following deficits and impairments:  Decreased mobility, Decreased strength, Impaired sensation, Postural dysfunction, Decreased scar mobility, Decreased balance, Decreased activity tolerance, Hypomobility, Improper body mechanics, Pain, Decreased endurance, Abnormal gait, Decreased coordination, Difficulty walking, Decreased safety awareness, Cardiopulmonary status limiting activity, Increased muscle spasms, Impaired perceived functional ability, Decreased range of motion  Visit Diagnosis: Malignant neoplasm of right ovary (HCC)  Other abnormalities of gait and mobility  Other lack of coordination  Muscle weakness (generalized)     Problem List Patient Active Problem List   Diagnosis Date Noted   Genetic testing 07/10/2021   Symptomatic anemia 07/05/2021   Chemotherapy induced neutropenia ([place]) 06/15/2021   Infusion reaction 05/10/2021   Family history of breast cancer 04/27/2021   Goals of care, counseling/discussion 04/19/2021   Pulmonary embolus ([place]) 04/19/2021   Encounter for antineoplastic chemotherapy 04/19/2021   Anxiety 04/19/2021   Ovarian cancer ([place]) 04/15/2021   Malignant ascites 04/11/2021   Other  cirrhosis of liver ([place]) 04/11/2021   DDD (degenerative disc disease), cervical 03/02/2019   Glaucoma (increased eye pressure) 03/02/2019   Venous stasis 03/02/2019   Facet arthritis of cervical region 04/01/2016   Incomplete tear of left rotator cuff 01/15/2016   Rotator cuff tendinitis, left 01/15/2016   Essential hypertension 10/02/2015   Obstructive sleep apnea syndrome 10/02/2015   Recurrent major depressive disorder, in full remission ([place]) 10/02/2015   Contusion of left knee 10/17/2014    Jerl Mina, PT 07/26/2021, 5:02 PM  [place] [address] [place], [place], [address] Phone: [phone]   Fax:  [phone]  Name: Yanelly Cantrelle MRN: [id number] Date of Birth: Mar 08, 1961

## 2021-07-26 NOTE — Telephone Encounter (Signed)
Nutrition  Called patient for nutrition follow-up.  No answer. Left message with call back number  Jayshun Galentine B. Hilda Wexler, RD, LDN Registered Dietitian [phone] (mobile)  

## 2021-07-27 ENCOUNTER — Inpatient Hospital Stay: Payer: BC Managed Care – PPO

## 2021-07-27 ENCOUNTER — Inpatient Hospital Stay: Payer: BC Managed Care – PPO | Attending: Oncology

## 2021-07-27 ENCOUNTER — Inpatient Hospital Stay (HOSPITAL_BASED_OUTPATIENT_CLINIC_OR_DEPARTMENT_OTHER): Payer: BC Managed Care – PPO | Admitting: Oncology

## 2021-07-27 ENCOUNTER — Encounter: Payer: Self-pay | Admitting: Oncology

## 2021-07-27 ENCOUNTER — Other Ambulatory Visit: Payer: Self-pay

## 2021-07-27 VITALS — BP 119/76 | HR 106 | Temp 97.6°F | Resp 16 | Wt 181.7 lb

## 2021-07-27 DIAGNOSIS — C561 Malignant neoplasm of right ovary: Secondary | ICD-10-CM

## 2021-07-27 DIAGNOSIS — E876 Hypokalemia: Secondary | ICD-10-CM | POA: Diagnosis not present

## 2021-07-27 DIAGNOSIS — G4730 Sleep apnea, unspecified: Secondary | ICD-10-CM | POA: Diagnosis not present

## 2021-07-27 DIAGNOSIS — Z5111 Encounter for antineoplastic chemotherapy: Secondary | ICD-10-CM | POA: Diagnosis not present

## 2021-07-27 DIAGNOSIS — C569 Malignant neoplasm of unspecified ovary: Secondary | ICD-10-CM

## 2021-07-27 DIAGNOSIS — K219 Gastro-esophageal reflux disease without esophagitis: Secondary | ICD-10-CM | POA: Insufficient documentation

## 2021-07-27 DIAGNOSIS — M5033 Other cervical disc degeneration, cervicothoracic region: Secondary | ICD-10-CM | POA: Diagnosis not present

## 2021-07-27 DIAGNOSIS — B379 Candidiasis, unspecified: Secondary | ICD-10-CM | POA: Insufficient documentation

## 2021-07-27 DIAGNOSIS — R21 Rash and other nonspecific skin eruption: Secondary | ICD-10-CM | POA: Diagnosis not present

## 2021-07-27 DIAGNOSIS — Z79899 Other long term (current) drug therapy: Secondary | ICD-10-CM | POA: Insufficient documentation

## 2021-07-27 DIAGNOSIS — Z86711 Personal history of pulmonary embolism: Secondary | ICD-10-CM | POA: Diagnosis not present

## 2021-07-27 DIAGNOSIS — J910 Malignant pleural effusion: Secondary | ICD-10-CM | POA: Insufficient documentation

## 2021-07-27 LAB — COMPREHENSIVE METABOLIC PANEL
ALT: 22 U/L (ref 0–44)
AST: 35 U/L (ref 15–41)
Albumin: 3 g/dL — ABNORMAL LOW (ref 3.5–5.0)
Alkaline Phosphatase: 160 U/L — ABNORMAL HIGH (ref 38–126)
Anion gap: 9 (ref 5–15)
BUN: 16 mg/dL (ref 8–23)
CO2: 25 mmol/L (ref 22–32)
Calcium: 9.5 mg/dL (ref 8.9–10.3)
Chloride: 100 mmol/L (ref 98–111)
Creatinine, Ser: 0.88 mg/dL (ref 0.44–1.00)
GFR, Estimated: >60 mL/min (ref >60)
Glucose, Bld: 133 mg/dL — ABNORMAL HIGH (ref 70–99)
Potassium: 3.6 mmol/L (ref 3.5–5.1)
Sodium: 134 mmol/L — ABNORMAL LOW (ref 135–145)
Total Bilirubin: 0.4 mg/dL (ref 0.3–1.2)
Total Protein: 6.5 g/dL (ref 6.5–8.1)

## 2021-07-27 LAB — SAMPLE TO BLOOD BANK

## 2021-07-27 LAB — CBC WITH DIFFERENTIAL/PLATELET
Abs Immature Granulocytes: 0.26 10*3/uL — ABNORMAL HIGH (ref 0.00–0.07)
Basophils Absolute: 0.1 10*3/uL (ref 0.0–0.1)
Basophils Relative: 0 %
Eosinophils Absolute: 0 10*3/uL (ref 0.0–0.5)
Eosinophils Relative: 0 %
HCT: 26.6 % — ABNORMAL LOW (ref 36.0–46.0)
Hemoglobin: 8.9 g/dL — ABNORMAL LOW (ref 12.0–15.0)
Immature Granulocytes: 1 %
Lymphocytes Relative: 7 %
Lymphs Abs: 1.7 10*3/uL (ref 0.7–4.0)
MCH: 33.5 pg (ref 26.0–34.0)
MCHC: 33.5 g/dL (ref 30.0–36.0)
MCV: 100 fL (ref 80.0–100.0)
Monocytes Absolute: 1.3 10*3/uL — ABNORMAL HIGH (ref 0.1–1.0)
Monocytes Relative: 5 %
Neutro Abs: 21 10*3/uL — ABNORMAL HIGH (ref 1.7–7.7)
Neutrophils Relative %: 87 %
Platelets: 267 10*3/uL (ref 150–400)
RBC: 2.66 MIL/uL — ABNORMAL LOW (ref 3.87–5.11)
RDW: 20.8 % — ABNORMAL HIGH (ref 11.5–15.5)
WBC: 24.3 10*3/uL — ABNORMAL HIGH (ref 4.0–10.5)
nRBC: 0 % (ref 0.0–0.2)

## 2021-07-27 MED ORDER — HEPARIN SOD (PORK) LOCK FLUSH 100 UNIT/ML IV SOLN
500.0000 [IU] | Freq: Once | INTRAVENOUS | Status: AC | PRN
  Filled 2021-07-27: qty 5

## 2021-07-27 MED ORDER — SODIUM CHLORIDE 0.9 % IV SOLN
10.0000 mg | Freq: Once | INTRAVENOUS | Status: AC
  Administered 2021-07-27: 10 mg via INTRAVENOUS
  Filled 2021-07-27: qty 10

## 2021-07-27 MED ORDER — LORAZEPAM 2 MG/ML IJ SOLN
0.5000 mg | Freq: Once | INTRAMUSCULAR | Status: AC | PRN
  Administered 2021-07-27: 0.5 mg via INTRAVENOUS
  Filled 2021-07-27: qty 1

## 2021-07-27 MED ORDER — SODIUM CHLORIDE 0.9 % IV SOLN
150.0000 mg | Freq: Once | INTRAVENOUS | Status: AC
  Administered 2021-07-27: 150 mg via INTRAVENOUS
  Filled 2021-07-27: qty 150

## 2021-07-27 MED ORDER — HEPARIN SOD (PORK) LOCK FLUSH 100 UNIT/ML IV SOLN
INTRAVENOUS | Status: AC
  Administered 2021-07-27: 500 [IU]
  Filled 2021-07-27: qty 5

## 2021-07-27 MED ORDER — TRAZODONE HCL 50 MG PO TABS: 50.0000 mg | ORAL_TABLET | Freq: Every day | ORAL | 0 refills | Status: DC

## 2021-07-27 MED ORDER — CLONAZEPAM 0.5 MG PO TABS: 0.5000 mg | ORAL_TABLET | Freq: Every day | ORAL | 0 refills | Status: DC | PRN

## 2021-07-27 MED ORDER — PACLITAXEL PROTEIN-BOUND CHEMO INJECTION 100 MG
260.0000 mg/m2 | Freq: Once | INTRAVENOUS | Status: AC
  Administered 2021-07-27: 500 mg via INTRAVENOUS
  Filled 2021-07-27: qty 100

## 2021-07-27 MED ORDER — SODIUM CHLORIDE 0.9 % IV SOLN
Freq: Once | INTRAVENOUS | Status: AC
  Filled 2021-07-27: qty 250

## 2021-07-27 MED ORDER — SODIUM CHLORIDE 0.9 % IV SOLN: 722.4000 mg | Freq: Once | INTRAVENOUS | Status: DC

## 2021-07-27 MED ORDER — PALONOSETRON HCL INJECTION 0.25 MG/5ML
0.2500 mg | Freq: Once | INTRAVENOUS | Status: AC
  Administered 2021-07-27: 0.25 mg via INTRAVENOUS
  Filled 2021-07-27: qty 5

## 2021-07-27 MED ORDER — SODIUM CHLORIDE 0.9 % IV SOLN
680.0000 mg | Freq: Once | INTRAVENOUS | Status: AC
  Administered 2021-07-27: 680 mg via INTRAVENOUS
  Filled 2021-07-27: qty 68

## 2021-07-27 NOTE — Progress Notes (Signed)
Hematology/Oncology progress note    Patient Care Team: Adin Hector, MD as PCP - General (Internal Medicine) Earlie Server, MD as Consulting Physician (Hematology and Oncology) Clent Jacks, RN as Oncology Nurse Navigator  REFERRING PROVIDER: Adin Hector, MD  CHIEF COMPLAINTS/REASON FOR VISIT:  Malignant ascites, ovarian neoplasm, pulmonary embolism  HISTORY OF PRESENTING ILLNESS:   Brittany Montoya is a  60 y.o.  female with PMH listed below was seen in consultation at the request of  Adin Hector, MD  for evaluation of complicated ovarian cyst, ascites  Patient has noticed generalized abdominal distention and bloating, nausea and constipation for the past months and  03/23/2021, CT abdomen pelvis with contrast showed large solid and cystic right ovarian mass measuring 14.1 x 18.1 x 16 cm highly suspicious for primary ovarian malignancy.  Evidence of peritoneal spread.  Moderate associated ascites. Slightly small liver with mild nodular contour suggesting a degree of cirrhosis.  04/03/2021, CA125 70.9, Hg 4 400 09.  04/04/2021, was seen by gynecology Dr. Ouida Sills.  And was referred to establish care with GYN oncology. 04/09/2021, patient underwent paracentesis and had 6.3 L of hazy yellow fluid removed.  Cytology is pending.  04/12/2021 patient was referred to see Dr. Theora Gianotti and me.  Patient also has noticed bilateral lower extremity edema, progressively worsening.  Fatigued, shortness of breath with exertion.  Denies any chest pain, cough.  Poor oral intake due to decreased appetite She was accompanied by her husband. Denies any alcohol use or previous hepatitis infection.  She is not aware about cirrhosis. She reports some symptom relief after the paracentesis.  Her abdomen seems to got better and getting worse again.  04/19/21- carbo AUC 5- taxol 135 mg/m2 05/10/21- carbo AUC 6 - taxol 150 mg/m2; reaction to taxol- discontinued 06/07/21- carbo AUC 6 - abraxane  260 mg/m2  # CA125/CEA ratio is <25,  06/29/2021 colonoscopy showed non bleeding internal hemorrhoids. Diverticulosis,  Distal transverse colon 4 mm polyp, resected and retrieved.-Pathology showed polypoid colonic mucosa with intramucosal lymphoid aggregate.  Negative for malignancy and dysplasia. Upper endoscopy showed esophageal mucosal changes suspicious for eosinophilic esophagitis.  Biopsied-pathology showed reflux esophagitis.  Negative for increased eosinophils..  Benign-appearing esophageal stenosis.  Hiatal hernia. 06/01/2021, patient has US abdomen paracentesis and had 4 L of fluid removed.  #06/25/2021 1. Redemonstrated large, mixed solid and cystic mass of the right ovary, which is slightly diminished in size. 2. Peritoneal and omental thickening and nodularity is improved compared to prior examination, although still present. 3. Findings are consistent with treatment response of primary ovarian malignancy and peritoneal and omental metastatic disease. 4. Moderate volume ascites throughout the abdomen and pelvis, similar in volume to prior. 5. No evidence of metastatic disease in the chest.  06/26/2021 therapeutic paracentesis.    06/27/2021 seen by Dr.Secord. patient is deconditioned. Dr.Secord recommend to continue neoadjuvant chemotherapy and optimize her health status with physical therapy and dietitian.   INTERVAL HISTORY Brittany Montoya is a 60 y.o. female who has above history reviewed by me today presents for follow up visit for malignant ascites, ovarian neoplasm, pulmonary embolism.   07/23/2021, ultrasound guided paracentesis, 4.1 L removed.  Patient reports feeling better after paracentesis.  Chronic shortness of breath unchanged Patient is on Eliquis 5 mg twice daily  Tired and fatigued.  Insomnia.  Patient requests some medication to help with sleeping.  Review of Systems  Constitutional:  Positive for appetite change and fatigue. Negative for chills and  fever.  HENT:   Negative for hearing loss and voice change.   Eyes:  Negative for eye problems.  Respiratory:  Positive for shortness of breath. Negative for chest tightness and cough.   Cardiovascular:  Negative for chest pain.  Gastrointestinal:  Positive for abdominal distention and constipation. Negative for abdominal pain and blood in stool.  Endocrine: Negative for hot flashes.  Genitourinary:  Negative for difficulty urinating and frequency.   Musculoskeletal:  Negative for arthralgias.  Skin:  Negative for itching and rash.  Neurological:  Negative for extremity weakness.  Hematological:  Negative for adenopathy.  Psychiatric/Behavioral:  Negative for confusion. The patient is not nervous/anxious.    MEDICAL HISTORY:  Past Medical History:  Diagnosis Date   Anxiety    Arthritis    Ascites    Cirrhosis of liver ([place])    DDD (degenerative disc disease), cervical    Depression    Family history of breast cancer    GERD (gastroesophageal reflux disease)    Glaucoma    Headache    migraines   Hypertension    Ovarian cancer ([place]) 04/15/2021   PONV (postoperative nausea and vomiting)    Pulmonary embolism ([place])    Sleep apnea    Venous stasis     SURGICAL HISTORY: Past Surgical History:  Procedure Laterality Date   ABDOMINAL HYSTERECTOMY N/A 2008   may have had left ovary removed   [place]     2012, 2015, 2020   COLONOSCOPY N/A 06/29/2021   Procedure: COLONOSCOPY;  Surgeon: Toledo, Benay Pike, MD;  Location: [facility] ENDOSCOPY;  Service: Gastroenterology;  Laterality: N/A;  DOCTORS ARE IN AGREEMENT THAT TOLEDO CAN DO THIS PROCEDURE IN RUSSO'S BLOCK   ESOPHAGOGASTRODUODENOSCOPY N/A 06/29/2021   Procedure: ESOPHAGOGASTRODUODENOSCOPY (EGD);  Surgeon: Toledo, Benay Pike, MD;  Location: [facility] ENDOSCOPY;  Service: Gastroenterology;  Laterality: N/A;   PARACENTESIS  04/20/2021   PILONIDAL CYST / SINUS EXCISION      PORTACATH PLACEMENT Right 06/01/2021   Procedure: INSERTION PORT-A-CATH;  Surgeon: Herbert Pun, MD;  Location: [facility] ORS;  Service: General;  Laterality: Right;    SOCIAL HISTORY: Social History   Socioeconomic History   Marital status: Married    Spouse name: Barnabas Lister   Number of children: 2   Years of education: Not on file   Highest education level: Not on file  Occupational History   Not on file  Tobacco Use   Smoking status: Never   Smokeless tobacco: Never  Vaping Use   Vaping Use: Never used  Substance and Sexual Activity   Alcohol use: No   Drug use: No   Sexual activity: Not Currently    Birth control/protection: Surgical  Other Topics Concern   Not on file  Social History Narrative   Not on file   Social Determinants of Health   Financial Resource Strain: Not on file  Food Insecurity: Not on file  Transportation Needs: Not on file  Physical Activity: Not on file  Stress: Not on file  Social Connections: Not on file  Intimate Partner Violence: Not on file    FAMILY HISTORY: Family History  Problem Relation Age of Onset   Breast cancer Mother 66   Breast cancer Maternal Aunt        d. under 53    ALLERGIES:  is allergic to paclitaxel, amlodipine, and penicillins.  MEDICATIONS:  Current Outpatient Medications  Medication Sig Dispense Refill  acetaminophen (TYLENOL) 500 MG tablet Take 1,000 mg by mouth at bedtime.     albuterol (VENTOLIN HFA) 108 (90 Base) MCG/ACT inhaler Inhale 1-2 puffs into the lungs every 6 (six) hours as needed for wheezing or shortness of breath.     apixaban (ELIQUIS) 5 MG TABS tablet Take 1 tablet (5 mg total) by mouth 2 (two) times daily. 60 tablet 0   clonazePAM (KLONOPIN) 0.5 MG tablet Take 1 tablet (0.5 mg total) by mouth daily as needed for anxiety. 30 tablet 0   dexamethasone (DECADRON) 4 MG tablet Take 2 tablets (8 mg total) by mouth See admin instructions. Take 8mg  daily for 2 days prior to your chemotherapy 30  tablet 0   hydrochlorothiazide (HYDRODIURIL) 12.5 MG tablet Take 12.5 mg by mouth daily.     latanoprost (XALATAN) 0.005 % ophthalmic solution Place 1 drop into both eyes at bedtime.     lidocaine-prilocaine (EMLA) cream Apply to affected area once (Patient taking differently: Apply 1 application topically daily as needed (pain). Apply to affected area once) 30 g 3   losartan (COZAAR) 50 MG tablet Take 50 mg by mouth daily.     Multiple Vitamin (MULTIVITAMIN WITH MINERALS) TABS tablet Take 1 tablet by mouth daily.     ondansetron (ZOFRAN) 8 MG tablet Take 1 tablet (8 mg total) by mouth 2 (two) times daily as needed for refractory nausea / vomiting. Start on day 3 after carboplatin chemo. 30 tablet 1   potassium chloride SA (KLOR-CON) 20 MEQ tablet Take 1 tablet (20 mEq total) by mouth 2 (two) times daily. 60 tablet 0   prochlorperazine (COMPAZINE) 10 MG tablet Take 1 tablet (10 mg total) by mouth every 6 (six) hours as needed (Nausea or vomiting). 30 tablet 1   timolol (TIMOPTIC) 0.5 % ophthalmic solution Place 1 drop into both eyes 2 (two) times daily.     traMADol (ULTRAM) 50 MG tablet Take 50 mg by mouth every 6 (six) hours as needed.     triamcinolone cream (KENALOG) 0.1 % Apply 1 application topically daily as needed (irritation).     No current facility-administered medications for this visit.   Facility-Administered Medications Ordered in Other Visits  Medication Dose Route Frequency Provider Last Rate Last Admin   LORazepam (ATIVAN) injection 0.5 mg  0.5 mg Intravenous Once PRN Earlie Server, MD         PHYSICAL EXAMINATION: ECOG PERFORMANCE STATUS: 1 - Symptomatic but completely ambulatory Vitals:   07/27/21 0834  BP: 119/76  Pulse: (!) 106  Resp: 16  Temp: 97.6 F (36.4 C)  SpO2: 99%    There were no vitals filed for this visit.   Physical Exam Constitutional:      General: She is not in acute distress.    Appearance: She is obese.  HENT:     Head: Normocephalic and  atraumatic.  Eyes:     General: No scleral icterus. Cardiovascular:     Rate and Rhythm: Normal rate and regular rhythm.     Heart sounds: Normal heart sounds.  Pulmonary:     Effort: Pulmonary effort is normal. No respiratory distress.     Breath sounds: Normal breath sounds. No wheezing.  Abdominal:     General: Bowel sounds are normal. There is distension.     Palpations: Abdomen is soft.  Musculoskeletal:        General: No deformity. Normal range of motion.     Cervical back: Normal range of motion and neck supple.  Skin:    General: Skin is warm and dry.     Findings: No erythema or rash.  Neurological:     Mental Status: She is alert and oriented to person, place, and time. Mental status is at baseline.     Cranial Nerves: No cranial nerve deficit.     Coordination: Coordination normal.  Psychiatric:        Mood and Affect: Mood normal.    LABORATORY DATA:  I have reviewed the data as listed Lab Results  Component Value Date   WBC 23.1 (H) 07/16/2021   HGB 9.3 (L) 07/16/2021   HCT 28.1 (L) 07/16/2021   MCV 100.7 (H) 07/16/2021   PLT 163 07/16/2021   Recent Labs    06/07/21 0814 07/05/21 0816 07/16/21 1048  NA 129* 134* 133*  K 3.6 3.4* 4.3  CL 97* 101 98  CO2 23 24 26   GLUCOSE 106* 134* 106*  BUN 16 20 7*  CREATININE 0.93 0.88 0.80  CALCIUM 9.7 9.7 9.0  GFRNONAA >60 >60 >60  PROT 6.2* 6.8 6.6  ALBUMIN 2.9* 3.2* 3.3*  AST 24 32 34  ALT 19 23 28   ALKPHOS 107 128* 184*  BILITOT 0.4 0.6 0.4    Iron/TIBC/Ferritin/ %Sat No results found for: IRON, TIBC, FERRITIN, IRONPCTSAT    RADIOGRAPHIC STUDIES: I have personally reviewed the radiological images as listed and agreed with the findings in the report. US Paracentesis  Result Date: 07/23/2021 INDICATION: History of ovarian cancer with recurrent ascites request received for therapeutic paracentesis. EXAM: ULTRASOUND GUIDED THERAPEUTIC PARACENTESIS MEDICATIONS: Local 1% lidocaine only. COMPLICATIONS:  None immediate. PROCEDURE: Informed written consent was obtained from the patient after a discussion of the risks, benefits and alternatives to treatment. A timeout was performed prior to the initiation of the procedure. Initial ultrasound scanning demonstrates a moderate amount of ascites within the right lower abdominal quadrant. The right lower abdomen was prepped and draped in the usual sterile fashion. 1% lidocaine was used for local anesthesia. Following this, a 19 gauge, 7-cm, Yueh catheter was introduced. An ultrasound image was saved for documentation purposes. The paracentesis was performed. The catheter was removed and a dressing was applied. The patient tolerated the procedure well without immediate post procedural complication. FINDINGS: A total of approximately 4.1 L of amber colored fluid was removed. IMPRESSION: Successful ultrasound-guided paracentesis yielding 4.1 liters of peritoneal fluid. Read By: Tsosie Billing PA-C Electronically Signed   By: Sandi Mariscal M.D.   On: 07/23/2021 15:54      ASSESSMENT & PLAN:  1. Malignant neoplasm of right ovary ([place])   2. Encounter for antineoplastic chemotherapy    #Large ovarian cystic mass with malignant ascites, peritoneal nodularity Case was discussed on Gynonc tumor board on 04/25/2021. Consensus reached upon clinical diagnosis of locally advanced Stage II/III Ovarian Cancer, neoadjuvant chemotherapy followed by debulking surgery.  CA125 at her last visit trended up.  Repeat level today. Labs reviewed and are discussed with patient Proceed with cycle 5 carboplatin and Abraxane with Day 4 Udenyca Repeat CT chest abdomen pelvis.   #Bilateral pulmonary embolism continue Eliquis 5mg  BID.  # Anxiety, she is on Cymbalta 30mg  daily. She takes  Clonazepam 0.5mg  QHS PRN, refilled # Malignant ascites, paracentesis PRN if symptomatic.    #  hypokalemia, potassium has improved.  Continue Potassium 76meq BID  #Insomnia, trazodone 50 mg nightly as  needed.  #Radiographic evidence of liver cirrhosis Compensated.  Follow-up with gastroenterology.  All questions were answered. The patient knows to  call the clinic with any problems questions or concerns.  cc Tama High III, MD   Follow up plan  Repeat CBC, hold tube in 1 week +/- PRBC transfusion CT chest abdomen pelvis prior to next visit. Lab MD 3 weeks carboplatin/Abraxane.  +/- Blood transfusion Udenyca day 4.  We spent sufficient time to discuss many aspect of care, questions were answered to patient's satisfaction.  Earlie Server, MD, PhD [facility] Hematology Oncology 07/27/2021

## 2021-07-27 NOTE — Progress Notes (Signed)
Pulse Rate: 106. MD, Dr. Tasia Catchings, aware. Per MD order: okay to proceed with scheduled Abraxane and Carboplatin treatment today.

## 2021-07-27 NOTE — Patient Instructions (Signed)
[facility] [place] [facility]  Discharge Instructions: Thank you for choosing [facility] to provide your oncology and hematology care.  If you have a lab appointment with the Cancer Center, please go directly to the Cancer Center and check in at the registration area.  Wear comfortable clothing and clothing appropriate for easy access to any Portacath or PICC line.   We strive to give you quality time with your provider. You may need to reschedule your appointment if you arrive late (15 or more minutes).  Arriving late affects you and other patients whose appointments are after yours.  Also, if you miss three or more appointments without notifying the office, you may be dismissed from the clinic at the provider's discretion.      For prescription refill requests, have your pharmacy contact our office and allow 72 hours for refills to be completed.    Today you received the following chemotherapy and/or immunotherapy agents Abraxane and Carboplatin       To help prevent nausea and vomiting after your treatment, we encourage you to take your nausea medication as directed.  BELOW ARE SYMPTOMS THAT SHOULD BE REPORTED IMMEDIATELY: . *FEVER GREATER THAN 100.4 F (38 C) OR HIGHER . *CHILLS OR SWEATING . *NAUSEA AND VOMITING THAT IS NOT CONTROLLED WITH YOUR NAUSEA MEDICATION . *UNUSUAL SHORTNESS OF BREATH . *UNUSUAL BRUISING OR BLEEDING . *URINARY PROBLEMS (pain or burning when urinating, or frequent urination) . *BOWEL PROBLEMS (unusual diarrhea, constipation, pain near the anus) . TENDERNESS IN MOUTH AND THROAT WITH OR WITHOUT PRESENCE OF ULCERS (sore throat, sores in mouth, or a toothache) . UNUSUAL RASH, SWELLING OR PAIN  . UNUSUAL VAGINAL DISCHARGE OR ITCHING   Items with * indicate a potential emergency and should be followed up as soon as possible or go to the Emergency Department if any problems should occur.  Please show the CHEMOTHERAPY ALERT CARD or  IMMUNOTHERAPY ALERT CARD at check-in to the Emergency Department and triage nurse.  Should you have questions after your visit or need to cancel or reschedule your appointment, please contact [facility] [place] [facility]  [phone] and follow the prompts.  Office hours are 8:00 a.m. to 4:30 p.m. Monday - Friday. Please note that voicemails left after 4:00 p.m. may not be returned until the following business day.  We are closed weekends and major holidays. You have access to a nurse at all times for urgent questions. Please call the main number to the clinic [phone] and follow the prompts.  For any non-urgent questions, you may also contact your provider using MyChart. We now offer e-Visits for anyone 18 and older to request care online for non-urgent symptoms. For details visit [email or web address].   Also download the MyChart app! Go to the app store, search "MyChart", open the app, select [facility], and log in with your MyChart username and password.  Due to Covid, a mask is required upon entering the hospital/clinic. If you do not have a mask, one will be given to you upon arrival. For doctor visits, patients may have 1 support person aged 18 or older with them. For treatment visits, patients cannot have anyone with them due to current Covid guidelines and our immunocompromised population.  

## 2021-07-27 NOTE — Progress Notes (Signed)
Pt requesting refill of clonazepam and states she had fluid drained from abdomen on Monday.

## 2021-07-28 LAB — CA 125: Cancer Antigen (CA) 125: 54 U/mL — ABNORMAL HIGH (ref 0.0–38.1)

## 2021-07-30 ENCOUNTER — Inpatient Hospital Stay: Payer: BC Managed Care – PPO

## 2021-07-30 ENCOUNTER — Other Ambulatory Visit: Payer: Self-pay

## 2021-07-30 DIAGNOSIS — C569 Malignant neoplasm of unspecified ovary: Secondary | ICD-10-CM | POA: Diagnosis not present

## 2021-07-30 MED ORDER — PEGFILGRASTIM-CBQV 6 MG/0.6ML ~~LOC~~ SOSY
6.0000 mg | PREFILLED_SYRINGE | Freq: Once | SUBCUTANEOUS | Status: AC
  Administered 2021-07-30: 6 mg via SUBCUTANEOUS
  Filled 2021-07-30: qty 0.6

## 2021-08-01 ENCOUNTER — Ambulatory Visit: Payer: BC Managed Care – PPO

## 2021-08-03 ENCOUNTER — Other Ambulatory Visit: Payer: Self-pay | Admitting: Oncology

## 2021-08-03 ENCOUNTER — Other Ambulatory Visit: Payer: Self-pay

## 2021-08-03 ENCOUNTER — Inpatient Hospital Stay: Payer: BC Managed Care – PPO

## 2021-08-03 ENCOUNTER — Inpatient Hospital Stay (HOSPITAL_BASED_OUTPATIENT_CLINIC_OR_DEPARTMENT_OTHER): Payer: BC Managed Care – PPO | Admitting: Hospice and Palliative Medicine

## 2021-08-03 DIAGNOSIS — C561 Malignant neoplasm of right ovary: Secondary | ICD-10-CM

## 2021-08-03 DIAGNOSIS — B370 Candidal stomatitis: Secondary | ICD-10-CM | POA: Diagnosis not present

## 2021-08-03 DIAGNOSIS — R180 Malignant ascites: Secondary | ICD-10-CM

## 2021-08-03 DIAGNOSIS — T451X5A Adverse effect of antineoplastic and immunosuppressive drugs, initial encounter: Secondary | ICD-10-CM

## 2021-08-03 DIAGNOSIS — R21 Rash and other nonspecific skin eruption: Secondary | ICD-10-CM

## 2021-08-03 DIAGNOSIS — D649 Anemia, unspecified: Secondary | ICD-10-CM

## 2021-08-03 DIAGNOSIS — Z5111 Encounter for antineoplastic chemotherapy: Secondary | ICD-10-CM

## 2021-08-03 DIAGNOSIS — C569 Malignant neoplasm of unspecified ovary: Secondary | ICD-10-CM | POA: Diagnosis not present

## 2021-08-03 DIAGNOSIS — I2699 Other pulmonary embolism without acute cor pulmonale: Secondary | ICD-10-CM

## 2021-08-03 LAB — COMPREHENSIVE METABOLIC PANEL
ALT: 41 U/L (ref 0–44)
AST: 46 U/L — ABNORMAL HIGH (ref 15–41)
Albumin: 2.8 g/dL — ABNORMAL LOW (ref 3.5–5.0)
Alkaline Phosphatase: 203 U/L — ABNORMAL HIGH (ref 38–126)
Anion gap: 11 (ref 5–15)
BUN: 17 mg/dL (ref 8–23)
CO2: 23 mmol/L (ref 22–32)
Calcium: 8.7 mg/dL — ABNORMAL LOW (ref 8.9–10.3)
Chloride: 94 mmol/L — ABNORMAL LOW (ref 98–111)
Creatinine, Ser: 0.87 mg/dL (ref 0.44–1.00)
GFR, Estimated: >60 mL/min (ref >60)
Glucose, Bld: 129 mg/dL — ABNORMAL HIGH (ref 70–99)
Potassium: 3.6 mmol/L (ref 3.5–5.1)
Sodium: 128 mmol/L — ABNORMAL LOW (ref 135–145)
Total Bilirubin: 0.3 mg/dL (ref 0.3–1.2)
Total Protein: 6.3 g/dL — ABNORMAL LOW (ref 6.5–8.1)

## 2021-08-03 LAB — SAMPLE TO BLOOD BANK

## 2021-08-03 LAB — CBC WITH DIFFERENTIAL/PLATELET
Abs Immature Granulocytes: 0.05 10*3/uL (ref 0.00–0.07)
Basophils Absolute: 0 10*3/uL (ref 0.0–0.1)
Basophils Relative: 1 %
Eosinophils Absolute: 0 10*3/uL (ref 0.0–0.5)
Eosinophils Relative: 1 %
HCT: 23.1 % — ABNORMAL LOW (ref 36.0–46.0)
Hemoglobin: 7.8 g/dL — ABNORMAL LOW (ref 12.0–15.0)
Immature Granulocytes: 2 %
Lymphocytes Relative: 24 %
Lymphs Abs: 0.5 10*3/uL — ABNORMAL LOW (ref 0.7–4.0)
MCH: 34.2 pg — ABNORMAL HIGH (ref 26.0–34.0)
MCHC: 33.8 g/dL (ref 30.0–36.0)
MCV: 101.3 fL — ABNORMAL HIGH (ref 80.0–100.0)
Monocytes Absolute: 0.6 10*3/uL (ref 0.1–1.0)
Monocytes Relative: 25 %
Neutro Abs: 1 10*3/uL — ABNORMAL LOW (ref 1.7–7.7)
Neutrophils Relative %: 47 %
Platelets: 226 10*3/uL (ref 150–400)
RBC: 2.28 MIL/uL — ABNORMAL LOW (ref 3.87–5.11)
RDW: 18.1 % — ABNORMAL HIGH (ref 11.5–15.5)
Smear Review: NORMAL
WBC: 2.2 10*3/uL — ABNORMAL LOW (ref 4.0–10.5)
nRBC: 0 % (ref 0.0–0.2)

## 2021-08-03 LAB — PREPARE RBC (CROSSMATCH)

## 2021-08-03 MED ORDER — HYDROCORTISONE BUTYRATE 0.1 % EX CREA
1.0000 | TOPICAL_CREAM | Freq: Every day | CUTANEOUS | 0 refills | Status: DC
Start: 2021-08-03 — End: 2021-09-25

## 2021-08-03 MED ORDER — HEPARIN SOD (PORK) LOCK FLUSH 100 UNIT/ML IV SOLN
INTRAVENOUS | Status: AC
  Administered 2021-08-03: 500 [IU]
  Filled 2021-08-03: qty 5

## 2021-08-03 MED ORDER — SODIUM CHLORIDE 0.9% IV SOLUTION
250.0000 mL | Freq: Once | INTRAVENOUS | Status: AC
  Administered 2021-08-03: 250 mL via INTRAVENOUS
  Filled 2021-08-03: qty 250

## 2021-08-03 MED ORDER — DIPHENHYDRAMINE HCL 25 MG PO CAPS
25.0000 mg | ORAL_CAPSULE | Freq: Once | ORAL | Status: AC
  Administered 2021-08-03: 25 mg via ORAL
  Filled 2021-08-03: qty 1

## 2021-08-03 MED ORDER — ACETAMINOPHEN 325 MG PO TABS
650.0000 mg | ORAL_TABLET | Freq: Once | ORAL | Status: AC
  Administered 2021-08-03: 650 mg via ORAL
  Filled 2021-08-03: qty 2

## 2021-08-03 MED ORDER — NYSTATIN 100000 UNIT/ML MT SUSP: 5.0000 mL | Freq: Four times a day (QID) | OROMUCOSAL | 0 refills | Status: DC

## 2021-08-03 NOTE — Progress Notes (Signed)
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 Patient is receiving blood today.  Met with patient during infusion.  Patient reports that she has not felt good after treatment, decreased appetite, nausea and pain.  "I wait to long and don't take my medicine early enough."  Says that she is eating potato soups, chicken and rice soup mostly.  Drinking ensure max shakes about 1 time per day.  Has thrush and will be starting treatment today.      Medications: nystatin   Labs: reviewed  Anthropometrics:   Weight 181 lb today  184 lb on 10/20 198 lb on 7/27   NUTRITION DIAGNOSIS: Inadequate oral intake continues   INTERVENTION:  Discussed trying higher calorie oral nutrition supplement, 350 calorie or higher with poor po intake.  Samples of ensure complete given and coupons.  Discussed options with patient Patient with questions regarding good snacks to eat.  Handout provided     MONITORING, EVALUATION, GOAL: weight trends, intake   NEXT VISIT: Friday, Dec 2nd during infusion  Tayven Renteria B. Zenia Resides, [place], [place] Registered Dietitian [phone] (mobile).

## 2021-08-03 NOTE — Patient Instructions (Signed)
Blood Transfusion, Adult, Care After This sheet gives you information about how to care for yourself after your procedure. Your doctor may also give you more specific instructions. If you have problems or questions, contact your doctor. What can I expect after the procedure? After the procedure, it is common to have: Bruising and soreness at the IV site. A headache. Follow these instructions at home: Insertion site care   Follow instructions from your doctor about how to take care of your insertion site. This is where an IV tube was put into your vein. Make sure you: Wash your hands with soap and water before and after you change your bandage (dressing). If you cannot use soap and water, use hand sanitizer. Change your bandage as told by your doctor. Check your insertion site every day for signs of infection. Check for: Redness, swelling, or pain. Bleeding from the site. Warmth. Pus or a bad smell. General instructions Take over-the-counter and prescription medicines only as told by your doctor. Rest as told by your doctor. Go back to your normal activities as told by your doctor. Keep all follow-up visits as told by your doctor. This is important. Contact a doctor if: You have itching or red, swollen areas of skin (hives). You feel worried or nervous (anxious). You feel weak after doing your normal activities. You have redness, swelling, warmth, or pain around the insertion site. You have blood coming from the insertion site, and the blood does not stop with pressure. You have pus or a bad smell coming from the insertion site. Get help right away if: You have signs of a serious reaction. This may be coming from an allergy or the body's defense system (immune system). Signs include: Trouble breathing or shortness of breath. Swelling of the face or feeling warm (flushed). Fever or chills. Head, chest, or back pain. Dark pee (urine) or blood in the pee. Widespread rash. Fast  heartbeat. Feeling dizzy or light-headed. You may receive your blood transfusion in an outpatient setting. If so, you will be told whom to contact to report any reactions. These symptoms may be an emergency. Do not wait to see if the symptoms will go away. Get medical help right away. Call your local emergency services (911 in the [place] Do not drive yourself to the hospital. Summary Bruising and soreness at the IV site are common. Check your insertion site every day for signs of infection. Rest as told by your doctor. Go back to your normal activities as told by your doctor. Get help right away if you have signs of a serious reaction. This information is not intended to replace advice given to you by your health care provider. Make sure you discuss any questions you have with your health care provider. Document Revised: 12/28/2020 Document Reviewed: 02/25/2019 Elsevier Patient Education  2022 Elsevier Inc.  

## 2021-08-03 NOTE — Progress Notes (Signed)
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  Patient is status post large-volume paracentesis.  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  Dr. Tasia Catchings last saw patient 07/27/2021 for follow-up visit after paracentesis on 11/7 with 4.1 L removed.  She felt better after this but still had persistent shortness of breath, fatigue, and insomnia.  Patient received cycle 5 carbo/Taxol on 11/11.  She has since felt fatigued and has developed oral/throat pain and a facial rash.  Patient presented to infusion on 11/18 for blood transfusion and has since he was requested to evaluate the above symptoms.  Patient reports that she has felt somewhat "lousy" since her last chemo treatment.  She denies fever or chills.  She has had some oral and throat discomfort and has noted white patches on her tongue.  She is also had a erythematous rash to her cheeks.  She has tried OTC lotions but has found them to cause burning sensation.  Patient endorses occasional abdominal pain and nausea but this is unchanged in severity or frequency.  Symptoms are relieved with tramadol and antiemetics.  She is having regular bowel movements with the bowel regimen.  Patient does not feel like her ascites has reaccumulated.  Denies any neurologic complaints. Denies recent fevers or illnesses. Denies any easy bleeding or bruising. Reports good appetite  and denies weight loss. Denies chest pain. Denies urinary complaints. Patient offers no further specific complaints today.  PAST MEDICAL HISTORY: Past Medical History:  Diagnosis Date   Anxiety    Arthritis    Ascites    Cirrhosis of liver ([place])    DDD (degenerative disc disease), cervical    Depression    Family history of breast cancer    GERD (gastroesophageal reflux disease)    Glaucoma    Headache    migraines   Hypertension    Ovarian cancer ([place]) 04/15/2021   PONV (postoperative nausea and vomiting)    Pulmonary embolism ([place])    Sleep apnea    Venous stasis     PAST SURGICAL HISTORY:  Past Surgical History:  Procedure Laterality Date   ABDOMINAL HYSTERECTOMY N/A 2008   may have had left ovary removed   [place]     2012, 2015, 2020   COLONOSCOPY N/A 06/29/2021   Procedure: COLONOSCOPY;  Surgeon: Toledo, Benay Pike, MD;  Location: [facility] ENDOSCOPY;  Service: Gastroenterology;  Laterality: N/A;  DOCTORS ARE IN AGREEMENT THAT TOLEDO CAN DO THIS PROCEDURE IN RUSSO'S BLOCK   ESOPHAGOGASTRODUODENOSCOPY N/A 06/29/2021   Procedure: ESOPHAGOGASTRODUODENOSCOPY (EGD);  Surgeon: Toledo, Benay Pike, MD;  Location: [facility] ENDOSCOPY;  Service: Gastroenterology;  Laterality: N/A;   PARACENTESIS  04/20/2021   PILONIDAL CYST / SINUS EXCISION     PORTACATH PLACEMENT Right 06/01/2021   Procedure: INSERTION PORT-A-CATH;  Surgeon: Herbert Pun, MD;  Location: [facility] ORS;  Service: General;  Laterality: Right;    HEMATOLOGY/ONCOLOGY HISTORY:  Oncology History  Ovarian cancer ([place])  04/15/2021 Initial Diagnosis   Ovarian cancer ([place])   04/18/2021 Cancer Staging   Staging form: Ovary, Fallopian Tube, and Primary Peritoneal Carcinoma, AJCC 8th Edition - Clinical stage from 04/18/2021: FIGO Stage II, calculated as Stage Unknown (cT2, cNX, cM0) - Signed by Earlie Server, MD on 04/19/2021 Stage prefix: Initial diagnosis    04/19/2021 -   Chemotherapy   Patient is on Treatment Plan : OVARIAN Carboplatin (AUC 6) / Paclitaxel (175) q21d x 6 cycles      Genetic Testing   Negative genetic testing. No pathogenic variants identified on the [facility] panel. The report date is 06/25/2021.  The [facility] gene panel offered by [employer] includes sequencing and deletion/duplication testing of the following 47 genes:  APC, ATM, AXIN2, BAP1, BARD1, BMPR1A, BRIP1, BRCA1, BRCA2, CDH1, CDK4, CDKN2A, CHEK2, CTNNA1, FH, FLCN, HOXB13 (seq only), MEN1, MET, MLH1, MSH2, MSH3 (excluding repetitive portions of exon 1), MSH6, MUTYH, NTHL1, PALB2, PMS2, PTEN, RAD51C, RAD51D, SDHA, SDHB, SDHC, SDHD, SMAD4, STK11, TP53, TSC1, TSC2, VHL.  [employer] CDx HRD was also negative.      ALLERGIES:  is allergic to paclitaxel, amlodipine, and penicillins.  MEDICATIONS:  Current Outpatient Medications  Medication Sig Dispense Refill   acetaminophen (TYLENOL) 500 MG tablet Take 1,000 mg by mouth at bedtime.     albuterol (VENTOLIN HFA) 108 (90 Base) MCG/ACT inhaler Inhale 1-2 puffs into the lungs every 6 (six) hours as needed for wheezing or shortness of breath.     apixaban (ELIQUIS) 5 MG TABS tablet Take 1 tablet (5 mg total) by mouth 2 (two) times daily. 60 tablet 0   clonazePAM (KLONOPIN) 0.5 MG tablet Take 1 tablet (0.5 mg total) by mouth daily as needed for anxiety. 30 tablet 0   dexamethasone (DECADRON) 4 MG tablet Take 2 tablets (8 mg total) by mouth See admin instructions. Take 71m daily for 2 days prior to your chemotherapy 30 tablet 0   hydrochlorothiazide (HYDRODIURIL) 12.5 MG tablet Take 12.5 mg by mouth daily.     latanoprost (XALATAN) 0.005 % ophthalmic solution Place 1 drop into both eyes at bedtime.     lidocaine-prilocaine (EMLA) cream Apply to affected area once (Patient taking differently: Apply 1 application topically daily as needed (pain). Apply to affected area once) 30 g 3   losartan (COZAAR) 50 MG tablet Take 50 mg  by mouth daily.     Multiple Vitamin (MULTIVITAMIN WITH MINERALS) TABS tablet Take 1 tablet by mouth daily.     ondansetron (ZOFRAN) 8 MG tablet Take 1 tablet (8 mg total) by mouth 2 (two) times daily as needed for refractory nausea / vomiting. Start on day 3 after carboplatin chemo. 30 tablet 1   potassium chloride SA (KLOR-CON) 20 MEQ tablet Take 1 tablet (20 mEq total) by mouth 2 (two) times daily. 60 tablet 0   prochlorperazine (COMPAZINE) 10 MG tablet Take 1 tablet (10 mg total) by mouth every 6 (six) hours as needed (Nausea or vomiting). 30 tablet 1   timolol (TIMOPTIC) 0.5 % ophthalmic solution Place 1 drop into both eyes 2 (two) times daily.     traMADol (ULTRAM) 50 MG tablet Take 50 mg by mouth every 6 (six) hours as needed.     traZODone (DESYREL) 50 MG tablet Take 1 tablet (50 mg total) by mouth at bedtime. 30 tablet 0   triamcinolone cream (KENALOG) 0.1 % Apply 1 application topically daily as needed (irritation).     No  current facility-administered medications for this visit.   Facility-Administered Medications Ordered in Other Visits  Medication Dose Route Frequency Provider Last Rate Last Admin   0.9 %  sodium chloride infusion (Manually program via Guardrails IV Fluids)  250 mL Intravenous Once Earlie Server, MD       acetaminophen (TYLENOL) tablet 650 mg  650 mg Oral Once Earlie Server, MD       diphenhydrAMINE (BENADRYL) capsule 25 mg  25 mg Oral Once Earlie Server, MD       LORazepam (ATIVAN) injection 0.5 mg  0.5 mg Intravenous Once PRN Earlie Server, MD        VITAL SIGNS: There were no vitals taken for this visit. There were no vitals filed for this visit.  Estimated body mass index is 34.33 kg/m as calculated from the following:   Height as of 06/29/21: _0  (1.549 m).   Weight as of 07/27/21: 181 lb 11.2 oz (82.4 kg).  LABS: CBC:    Component Value Date/Time   WBC 2.2 (L) 08/03/2021 0830   HGB 7.8 (L) 08/03/2021 0830   HCT 23.1 (L) 08/03/2021 0830   PLT 226 08/03/2021 0830    MCV 101.3 (H) 08/03/2021 0830   NEUTROABS 1.0 (L) 08/03/2021 0830   LYMPHSABS 0.5 (L) 08/03/2021 0830   MONOABS 0.6 08/03/2021 0830   EOSABS 0.0 08/03/2021 0830   BASOSABS 0.0 08/03/2021 0830   Comprehensive Metabolic Panel:    Component Value Date/Time   NA 128 (L) 08/03/2021 0830   K 3.6 08/03/2021 0830   CL 94 (L) 08/03/2021 0830   CO2 23 08/03/2021 0830   BUN 17 08/03/2021 0830   CREATININE 0.87 08/03/2021 0830   GLUCOSE 129 (H) 08/03/2021 0830   CALCIUM 8.7 (L) 08/03/2021 0830   AST 46 (H) 08/03/2021 0830   ALT 41 08/03/2021 0830   ALKPHOS 203 (H) 08/03/2021 0830   BILITOT 0.3 08/03/2021 0830   PROT 6.3 (L) 08/03/2021 0830   ALBUMIN 2.8 (L) 08/03/2021 0830    RADIOGRAPHIC STUDIES: US Paracentesis  Result Date: 07/23/2021 INDICATION: History of ovarian cancer with recurrent ascites request received for therapeutic paracentesis. EXAM: ULTRASOUND GUIDED THERAPEUTIC PARACENTESIS MEDICATIONS: Local 1% lidocaine only. COMPLICATIONS: None immediate. PROCEDURE: Informed written consent was obtained from the patient after a discussion of the risks, benefits and alternatives to treatment. A timeout was performed prior to the initiation of the procedure. Initial ultrasound scanning demonstrates a moderate amount of ascites within the right lower abdominal quadrant. The right lower abdomen was prepped and draped in the usual sterile fashion. 1% lidocaine was used for local anesthesia. Following this, a 19 gauge, 7-cm, Yueh catheter was introduced. An ultrasound image was saved for documentation purposes. The paracentesis was performed. The catheter was removed and a dressing was applied. The patient tolerated the procedure well without immediate post procedural complication. FINDINGS: A total of approximately 4.1 L of amber colored fluid was removed. IMPRESSION: Successful ultrasound-guided paracentesis yielding 4.1 liters of peritoneal fluid. Read By: Tsosie Billing PA-C Electronically Signed    By: Sandi Mariscal M.D.   On: 07/23/2021 15:54    PERFORMANCE STATUS (ECOG) : 1 - Symptomatic but completely ambulatory  Review of Systems Unless otherwise noted, a complete review of systems is negative.  Physical Exam General: NAD HEENT: Thrush on tongue, erythematous rash bilateral cheeks Pulmonary: Unlabored Extremities: no edema, no joint deformities Skin: no rashes Neurological: Weakness but otherwise nonfocal     Assessment and Plan- Patient is a 60 y.o. 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  Patient is status post large-volume paracentesis.  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  [facility] was requested for evaluation of thrush and facial rash   Thrush -we will start nystatin swish and swallow 4 times daily until resolution.  Rash -suspect chemo induced dermatitis.  Start hydrocortisone butyrate cream daily.  Case and plan discussed with Dr. Tasia Catchings   Patient expressed understanding and was in agreement with this plan. She also understands that She can call clinic at any time with any questions, concerns, or complaints.   Thank you for allowing me to participate in the care of this very pleasant patient.   Time Total: 15 minutes  Visit consisted of counseling and education dealing with the complex and emotionally intense issues of symptom management in the setting of serious illness.Greater than 50%  of this time was spent counseling and coordinating care related to the above assessment and plan.  Signed by: Altha Harm, PhD, NP-C

## 2021-08-04 LAB — BPAM RBC
Blood Product Expiration Date: 202212082359
ISSUE DATE / TIME: 202211181044
Unit Type and Rh: 6200

## 2021-08-04 LAB — TYPE AND SCREEN
ABO/RH(D): A POS
Antibody Screen: NEGATIVE
Unit division: 0

## 2021-08-07 ENCOUNTER — Encounter: Payer: Self-pay | Admitting: Oncology

## 2021-08-07 ENCOUNTER — Ambulatory Visit: Payer: BC Managed Care – PPO | Admitting: Physical Therapy

## 2021-08-08 ENCOUNTER — Ambulatory Visit: Admit: 2021-08-08 | Payer: BC Managed Care – PPO | Admitting: Internal Medicine

## 2021-08-08 SURGERY — COLONOSCOPY
Anesthesia: General

## 2021-08-08 NOTE — Telephone Encounter (Signed)
Per last note on 11/11: Proceed with cycle 5 carboplatin and Abraxane with Day 4 Udenyca, Repeat CT chest abdomen pelvis. However, I do not see it in the wrap up and no CT order in. Please advise. Not sure if you were referring to her actual last tx or after last treatment that she actually had

## 2021-08-13 ENCOUNTER — Other Ambulatory Visit: Payer: Self-pay

## 2021-08-13 DIAGNOSIS — C561 Malignant neoplasm of right ovary: Secondary | ICD-10-CM

## 2021-08-14 ENCOUNTER — Telehealth: Payer: Self-pay

## 2021-08-14 ENCOUNTER — Encounter: Payer: BC Managed Care – PPO | Admitting: Physical Therapy

## 2021-08-14 DIAGNOSIS — R180 Malignant ascites: Secondary | ICD-10-CM

## 2021-08-14 DIAGNOSIS — C561 Malignant neoplasm of right ovary: Secondary | ICD-10-CM

## 2021-08-14 NOTE — Telephone Encounter (Signed)
Please arrange her to get CT chest abdomen pelvis with contrast (next avail) and notify pt of appt. Thanks

## 2021-08-15 ENCOUNTER — Inpatient Hospital Stay (HOSPITAL_BASED_OUTPATIENT_CLINIC_OR_DEPARTMENT_OTHER): Payer: BC Managed Care – PPO | Admitting: Obstetrics and Gynecology

## 2021-08-15 ENCOUNTER — Other Ambulatory Visit: Payer: Self-pay | Admitting: Oncology

## 2021-08-15 ENCOUNTER — Other Ambulatory Visit: Payer: Self-pay

## 2021-08-15 ENCOUNTER — Inpatient Hospital Stay: Payer: BC Managed Care – PPO

## 2021-08-15 VITALS — BP 113/80 | HR 114 | Temp 97.8°F | Resp 20 | Wt 200.9 lb

## 2021-08-15 DIAGNOSIS — D539 Nutritional anemia, unspecified: Secondary | ICD-10-CM

## 2021-08-15 DIAGNOSIS — C561 Malignant neoplasm of right ovary: Secondary | ICD-10-CM

## 2021-08-15 DIAGNOSIS — K7469 Other cirrhosis of liver: Secondary | ICD-10-CM

## 2021-08-15 DIAGNOSIS — Z95828 Presence of other vascular implants and grafts: Secondary | ICD-10-CM

## 2021-08-15 DIAGNOSIS — C569 Malignant neoplasm of unspecified ovary: Secondary | ICD-10-CM | POA: Diagnosis not present

## 2021-08-15 DIAGNOSIS — M7989 Other specified soft tissue disorders: Secondary | ICD-10-CM

## 2021-08-15 DIAGNOSIS — B370 Candidal stomatitis: Secondary | ICD-10-CM

## 2021-08-15 DIAGNOSIS — R21 Rash and other nonspecific skin eruption: Secondary | ICD-10-CM

## 2021-08-15 DIAGNOSIS — I2699 Other pulmonary embolism without acute cor pulmonale: Secondary | ICD-10-CM

## 2021-08-15 LAB — APTT: aPTT: 38 seconds — ABNORMAL HIGH (ref 24–36)

## 2021-08-15 LAB — PROTIME-INR
INR: 1.2 (ref 0.8–1.2)
Prothrombin Time: 15.6 seconds — ABNORMAL HIGH (ref 11.4–15.2)

## 2021-08-15 NOTE — Progress Notes (Signed)
Gynecologic Oncology Interval Visit   Referring Provider: Burman Blacksmith Schermerhorn MD   Chief Concern: ascites (h/o cirrhosis) and pelvic mass with pathology confirming malignancy  Subjective:  Brittany Montoya is a 60 y.o. 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   Since her last visit she had 2 additional cycles of chemotherapy and is due for cycle #6 on Aug 17, 2021. She had 2 additional parcentesis procedures; colonoscopy and EGD.   06/26/2021  Successful ultrasound-guided paracentesis yielding 2.4 liters of  06/29/2021 Colonoscopy findings: One 4 mm polyp in the distal transverse colon, removed with a cold biopsy forceps. Resected and retrieved. - The examination was otherwise normal.  EGD findings:  - Esophageal mucosal changes suspicious for eosinophilic esophagitis. Biopsied. - Benign-appearing esophageal stenosis. - 1 cm hiatal hernia. - Normal examined duodenum. - The examination was otherwise normal.  DIAGNOSIS:  A. ESOPHAGUS, PROXIMAL AND DISTAL; COLD BIOPSY:  - SQUAMOUS MUCOSA WITH FEATURES OF REFLUX ESOPHAGITIS.  - NEGATIVE FOR INCREASED EOSINOPHILS.  - NEGATIVE FOR INTESTINAL METAPLASIA, DYSPLASIA, AND MALIGNANCY.   B. COLON POLYP, DISTAL TRANSVERSE; COLD BIOPSY:  - POLYPOID COLONIC MUCOSA WITH INTRAMUCOSAL LYMPHOID AGGREGATE.  - NEGATIVE FOR DYSPLASIA AND MALIGNANCY.   07/23/2021 Paracentesis - total of approximately 4.1 L of amber colored fluid was removed  She is really feeling down about her situation. She feels that the chemotherapy is making her ill. She has derm (rash) toxicity, mucositis, and neuropathy symptoms. She is feeling very distended and is scheduled for another paracentesis tomorrow.    Component Ref Range & Units 2 wk ago  (07/27/21) 1 mo ago  (07/05/21) 2 mo ago  (05/31/21) 2 mo ago   (05/24/21) 3 mo ago  (05/10/21) 3 mo ago  (04/19/21) 4 mo ago  (04/11/21)  Cancer Antigen (CA) 125 0.0 - 38.1 U/mL 54.0 High   171.0 High  CM  51.2 High  CM  53.1 High  CM  52.6 High  CM  61.1 High  CM  62.0 High  CM     Gynecologic Oncology History:  Ms Brittany Montoya initially seen in consultation from Dr. Ouida Sills for generalized abdominal pain with bloating, nausea, and constipation for 1 month.  03/23/2021 CT scan Large solid and cystic right ovarian mass measuring 14.1 x 18.1 x 16 cm highly suspicious for primary ovarian malignancy. 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 Moderate associated ascites. 2. Slightly small liver with mild nodular contour suggesting a degree of cirrhosis.  CA125  70.9  HE4  409  03/23/2021 Cytology + malignancy Cytokeratin 7: Positive  Cytokeratin 20: Negative  MOC-31: Positive  WT1: Negative  PAX8: Negative  Calretinin: Negative  CDX2: Negative  TTF-1: Negative  GATA3: Negative (focal non-specific staining of mesothelial cells)  p53: Non contributory  The differential includes Mullerian / gyne, breast,  pancreaticobiliary, upper GI, and lung as possible sites of origin. Correlation with radiographic findings is required.  CT imaging pancreas, lungs negative; mammogram 05/08/2020 negative BI-RADS 1  04/11/2021  Gyn Onc consult. She spends >50% of her waking time in a bed or chair. We recommended neoadjuvant on chemotherapy based on performance status of 2-3, frailty, possible cirrhosis. Tumor markers were: CA 125- 62, CEA 4.0. With ratio range of less than 27 raising concern for non-ovarian malignancy.   Progressive SOB prompted CTA- 04/12/21 which was positive for multiple bilateral lobar,  segmental and subsegmental pulmonary emboli and upper abdominal ascites. On anticoagulation.   Treatment Summary:  04/19/21- cycle #1 carbo AUC 5- taxol 135 mg/m2 05/10/21- cycle #2 carbo AUC 6 - taxol 150 mg/m2; reaction to taxol-  discontinued 06/07/21- cycle #3 carbo AUC 6 - abraxane 260 mg/m2 07/06/21- cycle #4 carbo AUC 6 - abraxane 260 mg/m2 07/27/21- cycle #5 carbo AUC 6 - abraxane 260 mg/m2     CT C/A/P on 06/25/21 Redemonstrated large, mixed solid and cystic mass of the right ovary, which is slightly diminished in size, measuring 18.1 x 12.7 x 12.8 cm, previously 18.1 x 14.1 x 16 cm (series 2, image 83, series 4, image 74). Status post hysterectomy.  1. Redemonstrated large, mixed solid and cystic mass of the right ovary, which is slightly diminished in size. 2. Peritoneal and omental thickening and nodularity is improved compared to prior examination, although still present. 3. Findings are consistent with treatment response of primary ovarian malignancy and peritoneal and omental metastatic disease. 4. Moderate volume ascites throughout the abdomen and pelvis, similar in volume to prior. 5. No evidence of metastatic disease in the chest   Genetic Testing: MyRisk +HRD Panel, BRCA1/2 - Negative and HRD negative. Germline testing no clinically significant mutation identified   GI Evaluation:  She was seen by Dr. 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 MELD score of 6 suggesting well compensated disease.    Ref Range & Units 4 mo ago   Hepatitis B Surface Ag NON REACTIVE NON REACTIVE   HCV Ab NON REACTIVE NON REACTIVE   Comment: (NOTE)  Nonreactive HCV antibody screen is consistent with no HCV infections,  unless recent infection is suspected or other evidence exists to  indicate HCV infection.    Hep A IgM NON REACTIVE NON REACTIVE   Hep B C IgM NON REACTIVE NON REACTIVE    Problem List: Patient Active Problem List   Diagnosis Date Noted   Genetic testing 07/10/2021   Symptomatic anemia 07/05/2021   Chemotherapy induced neutropenia ([place]) 06/15/2021   Infusion reaction 05/10/2021   Family history of breast cancer 04/27/2021   Goals of care,  counseling/discussion 04/19/2021   Pulmonary embolus ([place]) 04/19/2021   Encounter for antineoplastic chemotherapy 04/19/2021   Anxiety 04/19/2021   Ovarian cancer ([place]) 04/15/2021   Malignant ascites 04/11/2021   Other cirrhosis of liver ([place]) 04/11/2021   DDD (degenerative disc disease), cervical 03/02/2019   Glaucoma (increased eye pressure) 03/02/2019   Venous stasis 03/02/2019   Facet arthritis of cervical region 04/01/2016   Incomplete tear of left rotator cuff 01/15/2016   Rotator cuff tendinitis, left 01/15/2016   Essential hypertension 10/02/2015   Obstructive sleep apnea syndrome 10/02/2015   Recurrent major depressive disorder, in full remission ([place]) 10/02/2015   Contusion of left knee 10/17/2014   Past Medical History: See problem list Past Medical History:  Diagnosis Date   Anxiety    Arthritis    Ascites    Cirrhosis of liver (HCC)    DDD (degenerative disc disease), cervical    Depression    Family history of breast cancer    GERD (gastroesophageal reflux disease)    Glaucoma    Headache    migraines   Hypertension    Ovarian cancer ([place]) 04/15/2021   PONV (postoperative nausea and vomiting)    Pulmonary embolism (HCC)    Sleep apnea    Venous stasis    Past Surgical History: Past Surgical History:  Procedure Laterality Date   ABDOMINAL HYSTERECTOMY N/A 2008   may have had left ovary removed   [place]     2012, 2015, 2020   COLONOSCOPY N/A 06/29/2021   Procedure: COLONOSCOPY;  Surgeon: Toledo, Benay Pike, MD;  Location: [facility] ENDOSCOPY;  Service: Gastroenterology;  Laterality: N/A;  DOCTORS ARE IN AGREEMENT THAT TOLEDO CAN DO THIS PROCEDURE IN RUSSO'S BLOCK   ESOPHAGOGASTRODUODENOSCOPY N/A 06/29/2021   Procedure: ESOPHAGOGASTRODUODENOSCOPY (EGD);  Surgeon: Toledo, Benay Pike, MD;  Location: [facility] ENDOSCOPY;  Service: Gastroenterology;  Laterality: N/A;   PARACENTESIS  04/20/2021   PILONIDAL  CYST / SINUS EXCISION     PORTACATH PLACEMENT Right 06/01/2021   Procedure: INSERTION PORT-A-CATH;  Surgeon: Herbert Pun, MD;  Location: [facility] ORS;  Service: General;  Laterality: Right;   Past Gynecologic History:  Menarche: 11 mammogram 05/08/2020 negative BI-RADS 1  OB History: G2P2 OB History  Gravida Para Term Preterm AB Living  _0 SAB IAB Ectopic Multiple Live Births               # Outcome Date GA Lbr Len/2nd Weight Sex Delivery Anes PTL Lv  2 Para           1 Para             Obstetric Comments  C/Section x 2   Family History: Family History  Problem Relation Age of Onset   Breast cancer Mother 67   Breast cancer Maternal Aunt        d. 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  Social History   Socioeconomic History   Marital status: Married    Spouse name: Barnabas Lister   Number of children: 2   Years of education: Not on file   Highest education level: Not on file  Occupational History   Not on file  Tobacco Use   Smoking status: Never   Smokeless tobacco: Never  Vaping Use   Vaping Use: Never used  Substance and Sexual Activity   Alcohol use: No   Drug use: No   Sexual activity: Not Currently    Birth control/protection: Surgical  Other Topics Concern   Not on file  Social History Narrative   Not on file   Social Determinants of Health   Financial Resource Strain: Not on file  Food Insecurity: Not on file  Transportation Needs: Not on file  Physical Activity: Not on file  Stress: Not on file  Social Connections: Not on file  Intimate Partner Violence: Not on file   Allergies: Allergies  Allergen Reactions   Paclitaxel Shortness Of Breath and Other (See Comments)    pt flush, stated she didn't feel well, taxol stopped (05/10/2021)   Amlodipine Other (See Comments) and Rash    Not effective Not effective    Penicillins Rash   Current Medications: Current Outpatient  Medications  Medication Sig Dispense Refill   acetaminophen (TYLENOL) 500 MG tablet Take 1,000 mg by mouth at bedtime.     albuterol (VENTOLIN HFA) 108 (90 Base) MCG/ACT inhaler Inhale 1-2 puffs into the lungs every 6 (six) hours as needed for wheezing or shortness of breath.     apixaban (ELIQUIS) 5 MG TABS tablet Take 1 tablet (5 mg total) by mouth 2 (two) times daily. 60 tablet 0  clonazePAM (KLONOPIN) 0.5 MG tablet Take 1 tablet (0.5 mg total) by mouth daily as needed for anxiety. 30 tablet 0   dexamethasone (DECADRON) 4 MG tablet Take 2 tablets (8 mg total) by mouth See admin instructions. Take 83m daily for 2 days prior to your chemotherapy 30 tablet 0   hydrochlorothiazide (HYDRODIURIL) 12.5 MG tablet Take 12.5 mg by mouth daily.     hydrocortisone butyrate (LUCOID) 0.1 % CREA cream Apply 1 application topically daily. 45 g 0   latanoprost (XALATAN) 0.005 % ophthalmic solution Place 1 drop into both eyes at bedtime.     lidocaine-prilocaine (EMLA) cream Apply to affected area once (Patient taking differently: Apply 1 application topically daily as needed (pain). Apply to affected area once) 30 g 3   losartan (COZAAR) 50 MG tablet Take 50 mg by mouth daily.     Multiple Vitamin (MULTIVITAMIN WITH MINERALS) TABS tablet Take 1 tablet by mouth daily.     nystatin (MYCOSTATIN) 100000 UNIT/ML suspension Use as directed 5 mLs (500,000 Units total) in the mouth or throat 4 (four) times daily. 473 mL 0   ondansetron (ZOFRAN) 8 MG tablet Take 1 tablet (8 mg total) by mouth 2 (two) times daily as needed for refractory nausea / vomiting. Start on day 3 after carboplatin chemo. 30 tablet 1   potassium chloride SA (KLOR-CON) 20 MEQ tablet Take 1 tablet (20 mEq total) by mouth 2 (two) times daily. 60 tablet 0   prochlorperazine (COMPAZINE) 10 MG tablet Take 1 tablet (10 mg total) by mouth every 6 (six) hours as needed (Nausea or vomiting). 30 tablet 1   timolol (TIMOPTIC) 0.5 % ophthalmic solution Place 1  drop into both eyes 2 (two) times daily.     traMADol (ULTRAM) 50 MG tablet Take 50 mg by mouth every 6 (six) hours as needed.     traZODone (DESYREL) 50 MG tablet Take 1 tablet (50 mg total) by mouth at bedtime. 30 tablet 0   triamcinolone cream (KENALOG) 0.1 % Apply 1 application topically daily as needed (irritation).     No current facility-administered medications for this visit.   Facility-Administered Medications Ordered in Other Visits  Medication Dose Route Frequency Provider Last Rate Last Admin   LORazepam (ATIVAN) injection 0.5 mg  0.5 mg Intravenous Once PRN YEarlie Server MD        Review of Systems General: fatigue and decreased appetite o/w no complaints  HEENT: no complaints  Lungs: SOB o/w no complaints  Cardiac: no complaints  GI: abdominal pain, bloating, distension; n/v; constipation o/w no complaints  GU: pelvic pain o/w no complaints  Musculoskeletal: no complaints  Extremities: no complaints  Skin: no complaints  Neuro: peripheral neuropathy o/w no complaints  Endocrine: no complaints  Psych: no complaints       Objective:  Physical Examination:  BP 113/80   Pulse (!) 114   Temp 97.8 F (36.6 C)   Resp 20   Wt 200 lb 14.4 oz (91.1 kg)   SpO2 100%   BMI 37.96 kg/m    ECOG Performance Status: 2-3  GENERAL: Patient is pale appearing female in no acute distress HEENT:  PERRL, neck supple with midline trachea. Sclera clear NODES:  No cervical, supraclavicular, axillary, or inguinal lymphadenopathy palpated.  LUNGS:  normal respiratory effort HEART:  Regular rate and rhythm.  ABDOMEN:  Firm and distended with ascites; caput medusa present. Nontender to deep palpation. Possible mass in the lower abdomen.  Extremities: 2+ edema unchanged from prior  exam NEURO:  Nonfocal. Well oriented.  Appropriate affect.  Pelvic: EGBUS: no lesions Cervix: surgically absent Vagina: no lesions, no discharge or bleeding; the prior 2 cm nodule is no longer palpable  compared to her initial visit. Uterus: surgically absent Adnexa: pelvic fullness and mass filling the cul de sac.  Rectovaginal: patient declined.    Lab Review CBC EXTENDED Latest Ref Rng & Units 08/03/2021 07/27/2021 07/16/2021  WBC 4.0 - 10.5 K/uL 2.2(L) 24.3(H) 23.1(H)  RBC 3.87 - 5.11 MIL/uL 2.28(L) 2.66(L) 2.79(L)  HGB 12.0 - 15.0 g/dL 7.8(L) 8.9(L) 9.3(L)  HCT 36.0 - 46.0 % 23.1(L) 26.6(L) 28.1(L)  PLT 150 - 400 K/uL 226 267 163  NEUTROABS 1.7 - 7.7 K/uL 1.0(L) 21.0(H) 19.4(H)  LYMPHSABS 0.7 - 4.0 K/uL 0.5(L) 1.7 1.6   CMP Latest Ref Rng & Units 08/03/2021 07/27/2021 07/16/2021  Glucose 70 - 99 mg/dL 129(H) 133(H) 106(H)  BUN 8 - 23 mg/dL 17 16 7(L)  Creatinine 0.44 - 1.00 mg/dL 0.87 0.88 0.80  Sodium 135 - 145 mmol/L 128(L) 134(L) 133(L)  Potassium 3.5 - 5.1 mmol/L 3.6 3.6 4.3  Chloride 98 - 111 mmol/L 94(L) 100 98  CO2 22 - 32 mmol/L _0 Calcium 8.9 - 10.3 mg/dL 8.7(L) 9.5 9.0  Total Protein 6.5 - 8.1 g/dL 6.3(L) 6.5 6.6  Total Bilirubin 0.3 - 1.2 mg/dL 0.3 0.4 0.4  Alkaline Phos 38 - 126 U/L 203(H) 160(H) 184(H)  AST 15 - 41 U/L 46(H) 35 34  ALT 0 - 44 U/L 41 22 28    Albumin 2.9  Radiologic Imaging:  03/23/2021   06/25/21- CT C/A/P     Assessment:  Brittany Montoya is a 60 y.o. 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 Persistent ascites uncertain if this is due to disease or cirrhosis. CA125 stable. Based on cytology still unclear if this is an ovarian/tubal/peritoneal cancer. Imaging, EGD, and coloscopy negative for lung, breast, pancreatic, upper biliary, stomach or colon cancer.   Performance status 2-3  Cirrhosis- well compensated, MELD score=6. Followed by GI  Bilateral LE edema, chronic  Hx of PE, chronic anticoagulation  Hypoalbuminemia  Anemia, uncertain etiology    Medical co-morbidities complicating  care: Essential hypertension, Obstructive sleep apnea syndrome, Body mass index is 37.96 kg/m. Prior intra-abdominal surgery CESAREAN SECTION and hysterectomy.   Plan:   Problem List Items Addressed This Visit       Cardiovascular and Mediastinum   Pulmonary embolus ([place])     Digestive   Other cirrhosis of liver (HCC)     Endocrine   Ovarian cancer ([place]) - Primary   Relevant Orders   Protime-INR   APTT   Other Visit Diagnoses     Thrush       Rash       Port-A-Cath in place       Swelling of both lower extremities          Plan to proceed with chemotherapy on Friday. Follow up CT scan  She has multiple comorbidities, cirrhosis MELD score =6 is very concerning, deconditioning and hypoalbuminemia. Recommended Dr. Damita Lack at [facility] for preoperative evaluation and perioperative care. He can set up a visit with her within this next month.   Optimization of health status with PT consult. She has seen the dietician and I asked her to increase supplements to BID.   Anemia work up and if iron  deficiency arrange for IV iron.   Follow up in 3-4 weeks at [facility] for consideration of surgery. She is high risk for surgical complications and she is aware of this fact due to her other comorbid conditions, namely cirrhosis. She will need preop appt for surgical evaluation and anesthesia evaluation. I requested a postop ICU bed.   The patient's diagnosis, an outline of the further diagnostic and laboratory studies which will be required, the recommendation, and alternatives were discussed.  All questions were answered to the patient's satisfaction.  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   [place] Gaetana Michaelis, MD

## 2021-08-16 ENCOUNTER — Ambulatory Visit
Admission: RE | Admit: 2021-08-16 | Discharge: 2021-08-16 | Disposition: A | Discharge status: Home / Self Care | Payer: BC Managed Care – PPO | Source: Ambulatory Visit | Attending: Oncology | Admitting: Oncology

## 2021-08-16 ENCOUNTER — Inpatient Hospital Stay: Payer: BC Managed Care – PPO

## 2021-08-16 ENCOUNTER — Encounter: Payer: BC Managed Care – PPO | Admitting: Physical Therapy

## 2021-08-16 DIAGNOSIS — R180 Malignant ascites: Secondary | ICD-10-CM | POA: Diagnosis present

## 2021-08-17 ENCOUNTER — Other Ambulatory Visit: Payer: Self-pay | Admitting: Oncology

## 2021-08-17 ENCOUNTER — Inpatient Hospital Stay: Payer: BC Managed Care – PPO | Attending: Oncology

## 2021-08-17 ENCOUNTER — Inpatient Hospital Stay: Payer: BC Managed Care – PPO

## 2021-08-17 ENCOUNTER — Encounter: Payer: Self-pay | Admitting: Oncology

## 2021-08-17 ENCOUNTER — Inpatient Hospital Stay (HOSPITAL_BASED_OUTPATIENT_CLINIC_OR_DEPARTMENT_OTHER): Payer: BC Managed Care – PPO | Admitting: Oncology

## 2021-08-17 ENCOUNTER — Other Ambulatory Visit: Payer: Self-pay

## 2021-08-17 VITALS — BP 92/67 | HR 72 | Temp 98.9°F | Resp 18

## 2021-08-17 VITALS — BP 122/90 | HR 102 | Temp 97.4°F | Resp 18 | Wt 190.1 lb

## 2021-08-17 DIAGNOSIS — C561 Malignant neoplasm of right ovary: Secondary | ICD-10-CM

## 2021-08-17 DIAGNOSIS — R180 Malignant ascites: Secondary | ICD-10-CM | POA: Insufficient documentation

## 2021-08-17 DIAGNOSIS — D696 Thrombocytopenia, unspecified: Secondary | ICD-10-CM | POA: Insufficient documentation

## 2021-08-17 DIAGNOSIS — G893 Neoplasm related pain (acute) (chronic): Secondary | ICD-10-CM | POA: Diagnosis not present

## 2021-08-17 DIAGNOSIS — K7469 Other cirrhosis of liver: Secondary | ICD-10-CM | POA: Diagnosis not present

## 2021-08-17 DIAGNOSIS — D649 Anemia, unspecified: Secondary | ICD-10-CM

## 2021-08-17 DIAGNOSIS — E8809 Other disorders of plasma-protein metabolism, not elsewhere classified: Secondary | ICD-10-CM | POA: Diagnosis not present

## 2021-08-17 DIAGNOSIS — Z7901 Long term (current) use of anticoagulants: Secondary | ICD-10-CM | POA: Diagnosis not present

## 2021-08-17 DIAGNOSIS — R188 Other ascites: Secondary | ICD-10-CM | POA: Insufficient documentation

## 2021-08-17 DIAGNOSIS — Z5111 Encounter for antineoplastic chemotherapy: Secondary | ICD-10-CM | POA: Diagnosis not present

## 2021-08-17 DIAGNOSIS — G4700 Insomnia, unspecified: Secondary | ICD-10-CM | POA: Insufficient documentation

## 2021-08-17 DIAGNOSIS — D539 Nutritional anemia, unspecified: Secondary | ICD-10-CM | POA: Diagnosis not present

## 2021-08-17 DIAGNOSIS — Z5189 Encounter for other specified aftercare: Secondary | ICD-10-CM | POA: Insufficient documentation

## 2021-08-17 DIAGNOSIS — R112 Nausea with vomiting, unspecified: Secondary | ICD-10-CM | POA: Insufficient documentation

## 2021-08-17 DIAGNOSIS — E876 Hypokalemia: Secondary | ICD-10-CM | POA: Diagnosis not present

## 2021-08-17 DIAGNOSIS — I2699 Other pulmonary embolism without acute cor pulmonale: Secondary | ICD-10-CM | POA: Diagnosis not present

## 2021-08-17 DIAGNOSIS — I959 Hypotension, unspecified: Secondary | ICD-10-CM | POA: Diagnosis not present

## 2021-08-17 DIAGNOSIS — K5900 Constipation, unspecified: Secondary | ICD-10-CM | POA: Diagnosis not present

## 2021-08-17 DIAGNOSIS — C569 Malignant neoplasm of unspecified ovary: Secondary | ICD-10-CM

## 2021-08-17 DIAGNOSIS — Z86711 Personal history of pulmonary embolism: Secondary | ICD-10-CM | POA: Diagnosis not present

## 2021-08-17 DIAGNOSIS — R140 Abdominal distension (gaseous): Secondary | ICD-10-CM | POA: Insufficient documentation

## 2021-08-17 LAB — TECHNOLOGIST SMEAR REVIEW
Plt Morphology: NORMAL
RBC MORPHOLOGY: NORMAL
WBC MORPHOLOGY: NORMAL

## 2021-08-17 LAB — COMPREHENSIVE METABOLIC PANEL
ALT: 19 U/L (ref 0–44)
AST: 35 U/L (ref 15–41)
Albumin: 2.6 g/dL — ABNORMAL LOW (ref 3.5–5.0)
Alkaline Phosphatase: 170 U/L — ABNORMAL HIGH (ref 38–126)
Anion gap: 11 (ref 5–15)
BUN: 16 mg/dL (ref 8–23)
CO2: 22 mmol/L (ref 22–32)
Calcium: 9.1 mg/dL (ref 8.9–10.3)
Chloride: 99 mmol/L (ref 98–111)
Creatinine, Ser: 1.02 mg/dL — ABNORMAL HIGH (ref 0.44–1.00)
GFR, Estimated: >60 mL/min (ref >60)
Glucose, Bld: 132 mg/dL — ABNORMAL HIGH (ref 70–99)
Potassium: 3.6 mmol/L (ref 3.5–5.1)
Sodium: 132 mmol/L — ABNORMAL LOW (ref 135–145)
Total Bilirubin: 0.2 mg/dL — ABNORMAL LOW (ref 0.3–1.2)
Total Protein: 6 g/dL — ABNORMAL LOW (ref 6.5–8.1)

## 2021-08-17 LAB — CBC WITH DIFFERENTIAL/PLATELET
Abs Immature Granulocytes: 0.53 10*3/uL — ABNORMAL HIGH (ref 0.00–0.07)
Basophils Absolute: 0.1 10*3/uL (ref 0.0–0.1)
Basophils Relative: 0 %
Eosinophils Absolute: 0 10*3/uL (ref 0.0–0.5)
Eosinophils Relative: 0 %
HCT: 22.6 % — ABNORMAL LOW (ref 36.0–46.0)
Hemoglobin: 7.6 g/dL — ABNORMAL LOW (ref 12.0–15.0)
Immature Granulocytes: 2 %
Lymphocytes Relative: 6 %
Lymphs Abs: 1.5 10*3/uL (ref 0.7–4.0)
MCH: 35 pg — ABNORMAL HIGH (ref 26.0–34.0)
MCHC: 33.6 g/dL (ref 30.0–36.0)
MCV: 104.1 fL — ABNORMAL HIGH (ref 80.0–100.0)
Monocytes Absolute: 1.2 10*3/uL — ABNORMAL HIGH (ref 0.1–1.0)
Monocytes Relative: 5 %
Neutro Abs: 22.7 10*3/uL — ABNORMAL HIGH (ref 1.7–7.7)
Neutrophils Relative %: 87 %
Platelets: 141 10*3/uL — ABNORMAL LOW (ref 150–400)
RBC: 2.17 MIL/uL — ABNORMAL LOW (ref 3.87–5.11)
RDW: 18.2 % — ABNORMAL HIGH (ref 11.5–15.5)
WBC: 26 10*3/uL — ABNORMAL HIGH (ref 4.0–10.5)
nRBC: 0.1 % (ref 0.0–0.2)

## 2021-08-17 LAB — IRON AND TIBC
Iron: 50 ug/dL (ref 28–170)
Saturation Ratios: 27 % (ref 10.4–31.8)
TIBC: 186 ug/dL — ABNORMAL LOW (ref 250–450)
UIBC: 136 ug/dL

## 2021-08-17 LAB — VITAMIN B12: Vitamin B-12: 1598 pg/mL — ABNORMAL HIGH (ref 180–914)

## 2021-08-17 LAB — LACTATE DEHYDROGENASE: LDH: 157 U/L (ref 98–192)

## 2021-08-17 LAB — RETIC PANEL
Immature Retic Fract: 44.2 % — ABNORMAL HIGH (ref 2.3–15.9)
RBC.: 2.22 MIL/uL — ABNORMAL LOW (ref 3.87–5.11)
Retic Count, Absolute: 58.2 10*3/uL (ref 19.0–186.0)
Retic Ct Pct: 2.6 % (ref 0.4–3.1)
Reticulocyte Hemoglobin: 34.7 pg (ref >27.9)

## 2021-08-17 LAB — PREPARE RBC (CROSSMATCH)

## 2021-08-17 LAB — SAMPLE TO BLOOD BANK

## 2021-08-17 LAB — FOLATE: Folate: 19.7 ng/mL (ref >5.9)

## 2021-08-17 LAB — FERRITIN: Ferritin: 636 ng/mL — ABNORMAL HIGH (ref 11–307)

## 2021-08-17 MED ORDER — FUROSEMIDE 10 MG/ML IJ SOLN
20.0000 mg | Freq: Once | INTRAMUSCULAR | Status: AC
  Administered 2021-08-17: 20 mg via INTRAVENOUS
  Filled 2021-08-17: qty 2

## 2021-08-17 MED ORDER — PACLITAXEL PROTEIN-BOUND CHEMO INJECTION 100 MG
260.0000 mg/m2 | Freq: Once | INTRAVENOUS | Status: AC
  Administered 2021-08-17: 500 mg via INTRAVENOUS
  Filled 2021-08-17: qty 100

## 2021-08-17 MED ORDER — HEPARIN SOD (PORK) LOCK FLUSH 100 UNIT/ML IV SOLN
500.0000 [IU] | Freq: Once | INTRAVENOUS | Status: AC | PRN
  Filled 2021-08-17: qty 5

## 2021-08-17 MED ORDER — SODIUM CHLORIDE 0.9 % IV SOLN
622.8000 mg | Freq: Once | INTRAVENOUS | Status: AC
  Administered 2021-08-17: 620 mg via INTRAVENOUS
  Filled 2021-08-17: qty 62

## 2021-08-17 MED ORDER — SODIUM CHLORIDE 0.9 % IV SOLN
10.0000 mg | Freq: Once | INTRAVENOUS | Status: AC
  Administered 2021-08-17: 10 mg via INTRAVENOUS
  Filled 2021-08-17: qty 10

## 2021-08-17 MED ORDER — SODIUM CHLORIDE 0.9 % IV SOLN
Freq: Once | INTRAVENOUS | Status: AC
  Filled 2021-08-17: qty 250

## 2021-08-17 MED ORDER — PALONOSETRON HCL INJECTION 0.25 MG/5ML
0.2500 mg | Freq: Once | INTRAVENOUS | Status: AC
  Administered 2021-08-17: 0.25 mg via INTRAVENOUS
  Filled 2021-08-17: qty 5

## 2021-08-17 MED ORDER — ACETAMINOPHEN 325 MG PO TABS
650.0000 mg | ORAL_TABLET | Freq: Once | ORAL | Status: AC
  Administered 2021-08-17: 650 mg via ORAL
  Filled 2021-08-17: qty 2

## 2021-08-17 MED ORDER — LORAZEPAM 2 MG/ML IJ SOLN
0.5000 mg | Freq: Once | INTRAMUSCULAR | Status: AC | PRN
  Administered 2021-08-17: 0.5 mg via INTRAVENOUS
  Filled 2021-08-17: qty 1

## 2021-08-17 MED ORDER — SODIUM CHLORIDE 0.9% IV SOLUTION
250.0000 mL | Freq: Once | INTRAVENOUS | Status: AC
  Administered 2021-08-17: 250 mL via INTRAVENOUS
  Filled 2021-08-17: qty 250

## 2021-08-17 MED ORDER — SODIUM CHLORIDE 0.9 % IV SOLN
150.0000 mg | Freq: Once | INTRAVENOUS | Status: AC
  Administered 2021-08-17: 150 mg via INTRAVENOUS
  Filled 2021-08-17: qty 150

## 2021-08-17 MED ORDER — HEPARIN SOD (PORK) LOCK FLUSH 100 UNIT/ML IV SOLN
INTRAVENOUS | Status: AC
  Administered 2021-08-17: 500 [IU]
  Filled 2021-08-17: qty 5

## 2021-08-17 MED ORDER — DIPHENHYDRAMINE HCL 25 MG PO CAPS
25.0000 mg | ORAL_CAPSULE | Freq: Once | ORAL | Status: AC
  Administered 2021-08-17: 25 mg via ORAL
  Filled 2021-08-17: qty 1

## 2021-08-17 NOTE — Progress Notes (Signed)
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  Patient is receiving chemotherapy today.   Met with patient during infusion.  Patient reports that she had paracentesis yesterday with 4 Liters removed and appetite was better last night.  Overall appetite has been decreased, more nausea and pain.  Reports that she was able to eat chicken, fried rice/noodles for supper following paracentesis.  Has been having a hard time finding the ensure complete shakes.      Medications: reviewed  Labs: reviewed  Anthropometrics:   Weight 190 lb 1.6 oz  181 lb on 11/18 184 lb on 10/20 198 lb on 7/27   NUTRITION DIAGNOSIS: Inadequate oral intake continues   INTERVENTION:  Discussed options for purchasing 350 + calorie shakes (order via [employer] or [employer]).  Encouraged as often as she is able to drink. Discussed ways to add calories and protein to current diet Discussed ways to increase calories and protein in oatmeal (mix with milk, adding butter, nuts or peanut butter, fruit) Encouraged small frequent meals with ascites.  Contact information given    MONITORING, EVALUATION, GOAL: weight trends, intake   NEXT VISIT: phone call on Thursday Dec 29th  Burnis Halling B. Zenia Resides, [place], [place] Registered Dietitian [phone] (mobile)

## 2021-08-17 NOTE — Progress Notes (Signed)
Hematology/Oncology progress note    Patient Care Team: Adin Hector, MD as PCP - General (Internal Medicine) Earlie Server, MD as Consulting Physician (Hematology and Oncology) Clent Jacks, RN as Oncology Nurse Navigator  REFERRING PROVIDER: Adin Hector, MD  CHIEF COMPLAINTS/REASON FOR VISIT:  Malignant ascites, ovarian neoplasm, pulmonary embolism  HISTORY OF PRESENTING ILLNESS:   Brittany Montoya is a  60 y.o.  female with PMH listed below was seen in consultation at the request of  Adin Hector, MD  for evaluation of complicated ovarian cyst, ascites  Patient has noticed generalized abdominal distention and bloating, nausea and constipation for the past months and  03/23/2021, CT abdomen pelvis with contrast showed large solid and cystic right ovarian mass measuring 14.1 x 18.1 x 16 cm highly suspicious for primary ovarian malignancy.  Evidence of peritoneal spread.  Moderate associated ascites. Slightly small liver with mild nodular contour suggesting a degree of cirrhosis.  04/03/2021, CA125 70.9, Hg 4 400 09.  04/04/2021, was seen by gynecology Dr. Ouida Sills.  And was referred to establish care with GYN oncology. 04/09/2021, patient underwent paracentesis and had 6.3 L of hazy yellow fluid removed.  Cytology is pending.  04/12/2021 patient was referred to see Dr. Theora Gianotti and me.  Patient also has noticed bilateral lower extremity edema, progressively worsening.  Fatigued, shortness of breath with exertion.  Denies any chest pain, cough.  Poor oral intake due to decreased appetite She was accompanied by her husband. Denies any alcohol use or previous hepatitis infection.  She is not aware about cirrhosis. She reports some symptom relief after the paracentesis.  Her abdomen seems to got better and getting worse again.  04/19/21- carbo AUC 5- taxol 135 mg/m2 05/10/21- carbo AUC 6 - taxol 150 mg/m2; reaction to taxol- discontinued 06/07/21- carbo AUC 6 - abraxane  260 mg/m2  # CA125/CEA ratio is <25,  06/29/2021 colonoscopy showed non bleeding internal hemorrhoids. Diverticulosis,  Distal transverse colon 4 mm polyp, resected and retrieved.-Pathology showed polypoid colonic mucosa with intramucosal lymphoid aggregate.  Negative for malignancy and dysplasia. Upper endoscopy showed esophageal mucosal changes suspicious for eosinophilic esophagitis.  Biopsied-pathology showed reflux esophagitis.  Negative for increased eosinophils..  Benign-appearing esophageal stenosis.  Hiatal hernia. 06/01/2021, patient has US abdomen paracentesis and had 4 L of fluid removed.  #06/25/2021 1. Redemonstrated large, mixed solid and cystic mass of the right ovary, which is slightly diminished in size. 2. Peritoneal and omental thickening and nodularity is improved compared to prior examination, although still present. 3. Findings are consistent with treatment response of primary ovarian malignancy and peritoneal and omental metastatic disease. 4. Moderate volume ascites throughout the abdomen and pelvis, similar in volume to prior. 5. No evidence of metastatic disease in the chest.  06/26/2021 therapeutic paracentesis.    06/27/2021 seen by Dr.Secord. patient is deconditioned. Dr.Secord recommend to continue neoadjuvant chemotherapy and optimize her health status with physical therapy and dietitian.   07/23/2021, ultrasound guided paracentesis, 4.1 L removed.    INTERVAL HISTORY Brittany Montoya is a 60 y.o. female who has above history reviewed by me today presents for follow up visit for malignant ascites, ovarian neoplasm, pulmonary embolism.  Patient has tried trazodone which makes her very lethargic and drowsy. She has felt more nauseated and bilateral upper quadrant discomfort/pain which were relieved after recent paracentesis during which 4 L of fluid were removed.  Fatigue.  Chronic shortness of breath, improved after paracentesis.  Review of Systems  Constitutional:  Positive for appetite change and fatigue. Negative for chills and fever.  HENT:   Negative for hearing loss and voice change.   Eyes:  Negative for eye problems.  Respiratory:  Positive for shortness of breath. Negative for chest tightness and cough.   Cardiovascular:  Negative for chest pain.  Gastrointestinal:  Positive for abdominal distention and constipation. Negative for abdominal pain and blood in stool.  Endocrine: Negative for hot flashes.  Genitourinary:  Negative for difficulty urinating and frequency.   Musculoskeletal:  Negative for arthralgias.  Skin:  Negative for itching and rash.  Neurological:  Negative for extremity weakness.  Hematological:  Negative for adenopathy.  Psychiatric/Behavioral:  Negative for confusion. The patient is not nervous/anxious.    MEDICAL HISTORY:  Past Medical History:  Diagnosis Date   Anxiety    Arthritis    Ascites    Cirrhosis of liver ([place])    DDD (degenerative disc disease), cervical    Depression    Family history of breast cancer    GERD (gastroesophageal reflux disease)    Glaucoma    Headache    migraines   Hypertension    Ovarian cancer ([place]) 04/15/2021   PONV (postoperative nausea and vomiting)    Pulmonary embolism ([place])    Sleep apnea    Venous stasis     SURGICAL HISTORY: Past Surgical History:  Procedure Laterality Date   ABDOMINAL HYSTERECTOMY N/A 2008   may have had left ovary removed   [place]     2012, 2015, 2020   COLONOSCOPY N/A 06/29/2021   Procedure: COLONOSCOPY;  Surgeon: Toledo, Benay Pike, MD;  Location: [facility] ENDOSCOPY;  Service: Gastroenterology;  Laterality: N/A;  DOCTORS ARE IN AGREEMENT THAT TOLEDO CAN DO THIS PROCEDURE IN RUSSO'S BLOCK   ESOPHAGOGASTRODUODENOSCOPY N/A 06/29/2021   Procedure: ESOPHAGOGASTRODUODENOSCOPY (EGD);  Surgeon: Toledo, Benay Pike, MD;  Location: [facility] ENDOSCOPY;  Service: Gastroenterology;   Laterality: N/A;   PARACENTESIS  04/20/2021   PILONIDAL CYST / SINUS EXCISION     PORTACATH PLACEMENT Right 06/01/2021   Procedure: INSERTION PORT-A-CATH;  Surgeon: Herbert Pun, MD;  Location: [facility] ORS;  Service: General;  Laterality: Right;    SOCIAL HISTORY: Social History   Socioeconomic History   Marital status: Married    Spouse name: Barnabas Lister   Number of children: 2   Years of education: Not on file   Highest education level: Not on file  Occupational History   Not on file  Tobacco Use   Smoking status: Never   Smokeless tobacco: Never  Vaping Use   Vaping Use: Never used  Substance and Sexual Activity   Alcohol use: No   Drug use: No   Sexual activity: Not Currently    Birth control/protection: Surgical  Other Topics Concern   Not on file  Social History Narrative   Not on file   Social Determinants of Health   Financial Resource Strain: Not on file  Food Insecurity: Not on file  Transportation Needs: Not on file  Physical Activity: Not on file  Stress: Not on file  Social Connections: Not on file  Intimate Partner Violence: Not on file    FAMILY HISTORY: Family History  Problem Relation Age of Onset   Breast cancer Mother 57   Breast cancer Maternal Aunt        d. under 74    ALLERGIES:  is allergic to paclitaxel, amlodipine, and  penicillins.  MEDICATIONS:  Current Outpatient Medications  Medication Sig Dispense Refill   acetaminophen (TYLENOL) 500 MG tablet Take 1,000 mg by mouth at bedtime.     albuterol (VENTOLIN HFA) 108 (90 Base) MCG/ACT inhaler Inhale 1-2 puffs into the lungs every 6 (six) hours as needed for wheezing or shortness of breath.     apixaban (ELIQUIS) 5 MG TABS tablet Take 1 tablet (5 mg total) by mouth 2 (two) times daily. 60 tablet 0   clonazePAM (KLONOPIN) 0.5 MG tablet Take 1 tablet (0.5 mg total) by mouth daily as needed for anxiety. 30 tablet 0   dexamethasone (DECADRON) 4 MG tablet Take 2 tablets (8 mg total) by  mouth See admin instructions. Take 8mg  daily for 2 days prior to your chemotherapy 30 tablet 0   hydrochlorothiazide (HYDRODIURIL) 12.5 MG tablet Take 12.5 mg by mouth daily.     hydrocortisone butyrate (LUCOID) 0.1 % CREA cream Apply 1 application topically daily. 45 g 0   latanoprost (XALATAN) 0.005 % ophthalmic solution Place 1 drop into both eyes at bedtime.     lidocaine-prilocaine (EMLA) cream Apply to affected area once (Patient taking differently: Apply 1 application topically daily as needed (pain). Apply to affected area once) 30 g 3   losartan (COZAAR) 50 MG tablet Take 50 mg by mouth daily.     Multiple Vitamin (MULTIVITAMIN WITH MINERALS) TABS tablet Take 1 tablet by mouth daily.     nystatin (MYCOSTATIN) 100000 UNIT/ML suspension Use as directed 5 mLs (500,000 Units total) in the mouth or throat 4 (four) times daily. 473 mL 0   ondansetron (ZOFRAN) 8 MG tablet Take 1 tablet (8 mg total) by mouth 2 (two) times daily as needed for refractory nausea / vomiting. Start on day 3 after carboplatin chemo. 30 tablet 1   potassium chloride SA (KLOR-CON) 20 MEQ tablet Take 1 tablet (20 mEq total) by mouth 2 (two) times daily. 60 tablet 0   prochlorperazine (COMPAZINE) 10 MG tablet Take 1 tablet (10 mg total) by mouth every 6 (six) hours as needed (Nausea or vomiting). 30 tablet 1   timolol (TIMOPTIC) 0.5 % ophthalmic solution Place 1 drop into both eyes 2 (two) times daily.     traMADol (ULTRAM) 50 MG tablet Take 50 mg by mouth every 6 (six) hours as needed.     triamcinolone cream (KENALOG) 0.1 % Apply 1 application topically daily as needed (irritation).     No current facility-administered medications for this visit.   Facility-Administered Medications Ordered in Other Visits  Medication Dose Route Frequency Provider Last Rate Last Admin   LORazepam (ATIVAN) injection 0.5 mg  0.5 mg Intravenous Once PRN Earlie Server, MD         PHYSICAL EXAMINATION: ECOG PERFORMANCE STATUS: 1 - Symptomatic  but completely ambulatory Vitals:   08/17/21 0850  BP: 122/90  Pulse: (!) 102  Resp: 18  Temp: (!) 97.4 F (36.3 C)    There were no vitals filed for this visit.   Physical Exam Constitutional:      General: She is not in acute distress.    Appearance: She is obese.  HENT:     Head: Normocephalic and atraumatic.  Eyes:     General: No scleral icterus. Cardiovascular:     Rate and Rhythm: Normal rate and regular rhythm.     Heart sounds: Normal heart sounds.  Pulmonary:     Effort: Pulmonary effort is normal. No respiratory distress.     Breath sounds:  Normal breath sounds. No wheezing.  Abdominal:     General: Bowel sounds are normal. There is distension.     Palpations: Abdomen is soft.  Musculoskeletal:        General: No deformity. Normal range of motion.     Cervical back: Normal range of motion and neck supple.  Skin:    General: Skin is warm and dry.     Findings: No erythema or rash.  Neurological:     Mental Status: She is alert and oriented to person, place, and time. Mental status is at baseline.     Cranial Nerves: No cranial nerve deficit.     Coordination: Coordination normal.  Psychiatric:        Mood and Affect: Mood normal.    LABORATORY DATA:  I have reviewed the data as listed Lab Results  Component Value Date   WBC 26.0 (H) 08/17/2021   HGB 7.6 (L) 08/17/2021   HCT 22.6 (L) 08/17/2021   MCV 104.1 (H) 08/17/2021   PLT 141 (L) 08/17/2021   Recent Labs    07/27/21 0809 08/03/21 0830 08/17/21 0828  NA 134* 128* 132*  K 3.6 3.6 3.6  CL 100 94* 99  CO2 25 23 22   GLUCOSE 133* 129* 132*  BUN 16 17 16   CREATININE 0.88 0.87 1.02*  CALCIUM 9.5 8.7* 9.1  GFRNONAA >60 >60 >60  PROT 6.5 6.3* 6.0*  ALBUMIN 3.0* 2.8* 2.6*  AST 35 46* 35  ALT 22 41 19  ALKPHOS 160* 203* 170*  BILITOT 0.4 0.3 0.2*    Iron/TIBC/Ferritin/ %Sat    Component Value Date/Time   IRON 50 08/17/2021 0828   TIBC 186 (L) 08/17/2021 0828   FERRITIN 636 (H)  08/17/2021 0828   IRONPCTSAT 27 08/17/2021 0828      RADIOGRAPHIC STUDIES: I have personally reviewed the radiological images as listed and agreed with the findings in the report. US Paracentesis  Result Date: 08/16/2021 INDICATION: 60 year old female with history of ovarian cancer with recurrent ascites presenting for paracentesis. EXAM: ULTRASOUND GUIDED  PARACENTESIS MEDICATIONS: None. COMPLICATIONS: None immediate. PROCEDURE: Informed written consent was obtained from the patient after a discussion of the risks, benefits and alternatives to treatment. A timeout was performed prior to the initiation of the procedure. Initial ultrasound scanning demonstrates a large amount of ascites within the right lower abdominal quadrant. The right lower abdomen was prepped and draped in the usual sterile fashion. 1% lidocaine was used for local anesthesia. Following this, a 6 Fr Safe-T-Centesis catheter was introduced. An ultrasound image was saved for documentation purposes. The paracentesis was performed. The catheter was removed and a dressing was applied. The patient tolerated the procedure well without immediate post procedural complication. FINDINGS: A total of approximately 4 of translucent, amber fluid was removed. IMPRESSION: Successful ultrasound-guided paracentesis yielding 4 liters of peritoneal fluid. Ruthann Cancer, MD [facility] Electronically Signed   By: Ruthann Cancer M.D.   On: 08/16/2021 16:25   US Paracentesis  Result Date: 07/23/2021 INDICATION: History of ovarian cancer with recurrent ascites request received for therapeutic paracentesis. EXAM: ULTRASOUND GUIDED THERAPEUTIC PARACENTESIS MEDICATIONS: Local 1% lidocaine only. COMPLICATIONS: None immediate. PROCEDURE: Informed written consent was obtained from the patient after a discussion of the risks, benefits and alternatives to treatment. A timeout was performed prior to the  initiation of the procedure. Initial ultrasound scanning demonstrates a moderate amount of ascites within the right lower abdominal quadrant. The right lower abdomen was prepped and  draped in the usual sterile fashion. 1% lidocaine was used for local anesthesia. Following this, a 19 gauge, 7-cm, Yueh catheter was introduced. An ultrasound image was saved for documentation purposes. The paracentesis was performed. The catheter was removed and a dressing was applied. The patient tolerated the procedure well without immediate post procedural complication. FINDINGS: A total of approximately 4.1 L of amber colored fluid was removed. IMPRESSION: Successful ultrasound-guided paracentesis yielding 4.1 liters of peritoneal fluid. Read By: Tsosie Billing PA-C Electronically Signed   By: Sandi Mariscal M.D.   On: 07/23/2021 15:54      ASSESSMENT & PLAN:  1. Malignant neoplasm of right ovary (HCC)   2. Other acute pulmonary embolism without acute cor pulmonale (HCC)   3. Other cirrhosis of liver ([place])   4. Malignant ascites   5. Encounter for antineoplastic chemotherapy    #Large ovarian cystic mass with malignant ascites, peritoneal nodularity Case was discussed on Gynonc tumor board on 04/25/2021. Consensus reached upon clinical diagnosis of locally advanced Stage II/III Ovarian Cancer, neoadjuvant chemotherapy followed by debulking surgery.  CA125 is pending. Was reviewed and discussed with patient. Proceed with cycle 6 carboplatin/Abraxane with day 4 Udenyca Repeat CT scan after 6 cycles of treatment.  Scheduled.  #Symptomatic anemia, recommend to proceed with 1 unit of irradiated PRBC transfusion today. Anemia work-up was sent. Patient has adequate B12, folate, iron panel is consistent with anemia secondary to chronic disease.  No evidence of iron deficiency.  #Bilateral pulmonary embolism continue Eliquis 5mg  BID.  # Anxiety, she is on Cymbalta 30mg  daily. She takes  Clonazepam 0.5mg  QHS PRN, refilled #  Malignant ascites, paracentesis PRN if symptomatic.   -We will repeat ultrasound paracentesis next week. #  hypokalemia, potassium has improved.  Continue Potassium 23meq BID  #Insomnia, did not tolerate trazodone.  #Radiographic evidence of liver cirrhosis Compensated.  Discussed with Dr. Theora Gianotti who will refer patient to [facility] gastroenterology  All questions were answered. The patient knows to call the clinic with any problems questions or concerns.  cc Adin Hector, MD   Follow up plan  Lab MD 2 weeks carboplatin/Abraxane.  +/- Blood transfusion   We spent sufficient time to discuss many aspect of care, questions were answered to patient's satisfaction.  Earlie Server 08/17/2021

## 2021-08-17 NOTE — Progress Notes (Signed)
Hemoglobin 7.6 and HR 102, per Janeann Merl RN per Dr. Tasia Catchings okay to proceed with Abraxane and Carboplatin, pt to also receive 1 unit PRBCs.

## 2021-08-17 NOTE — Progress Notes (Signed)
Pt reports being more nauseous since last treatment. Pt also has had frequent nosebleeds and increased shortness of breath. Pt also reports having more frequent pain to abdomen.

## 2021-08-17 NOTE — Patient Instructions (Signed)
[facility] [place]  Discharge Instructions: Thank you for choosing [facility] to provide your oncology and hematology care.  If you have a lab appointment with the [facility], please go directly to the [facility] and check in at the registration area.  Wear comfortable clothing and clothing appropriate for easy access to any Portacath or PICC line.   We strive to give you quality time with your provider. You may need to reschedule your appointment if you arrive late (15 or more minutes).  Arriving late affects you and other patients whose appointments are after yours.  Also, if you miss three or more appointments without notifying the office, you may be dismissed from the clinic at the provider's discretion.      For prescription refill requests, have your pharmacy contact our office and allow 72 hours for refills to be completed.    Today you received the following chemotherapy and/or immunotherapy agents Abraxane and Carboplatin       To help prevent nausea and vomiting after your treatment, we encourage you to take your nausea medication as directed.  BELOW ARE SYMPTOMS THAT SHOULD BE REPORTED IMMEDIATELY: *FEVER GREATER THAN 100.4 F (38 C) OR HIGHER *CHILLS OR SWEATING *NAUSEA AND VOMITING THAT IS NOT CONTROLLED WITH YOUR NAUSEA MEDICATION *UNUSUAL SHORTNESS OF BREATH *UNUSUAL BRUISING OR BLEEDING *URINARY PROBLEMS (pain or burning when urinating, or frequent urination) *BOWEL PROBLEMS (unusual diarrhea, constipation, pain near the anus) TENDERNESS IN MOUTH AND THROAT WITH OR WITHOUT PRESENCE OF ULCERS (sore throat, sores in mouth, or a toothache) UNUSUAL RASH, SWELLING OR PAIN  UNUSUAL VAGINAL DISCHARGE OR ITCHING   Items with * indicate a potential emergency and should be followed up as soon as possible or go to the Emergency Department if any problems should occur.  Please show the CHEMOTHERAPY ALERT CARD or IMMUNOTHERAPY ALERT CARD  at check-in to the Emergency Department and triage nurse.  Should you have questions after your visit or need to cancel or reschedule your appointment, please contact [facility] [place] [facility] [place]  [phone] and follow the prompts.  Office hours are 8:00 a.m. to 4:30 p.m. Monday - Friday. Please note that voicemails left after 4:00 p.m. may not be returned until the following business day.  We are closed weekends and major holidays. You have access to a nurse at all times for urgent questions. Please call the main number to the clinic [phone] and follow the prompts.  For any non-urgent questions, you may also contact your provider using MyChart. We now offer e-Visits for anyone 46 and older to request care online for non-urgent symptoms. For details visit [email or web address].   Also download the MyChart app! Go to the app store, search "MyChart", open the app, select [facility], and log in with your MyChart username and password.  Due to Covid, a mask is required upon entering the hospital/clinic. If you do not have a mask, one will be given to you upon arrival. For doctor visits, patients may have 1 support person aged 22 or older with them. For treatment visits, patients cannot have anyone with them due to current Covid guidelines and our immunocompromised population.     Blood Transfusion, Adult A blood transfusion is a procedure in which you receive blood or a type of blood cell (blood component) through an IV. You may need a blood transfusion when your blood level is low. This may result from a bleeding disorder, illness, injury, or surgery. The blood  may come from a donor. You may also be able to donate blood for yourself (autologous blood donation) before a planned surgery. The blood given in a transfusion is made up of different blood components. You may receive: Red blood cells. These carry oxygen to the cells in the body. Platelets. These help your blood to  clot. Plasma. This is the liquid part of your blood. It carries proteins and other substances throughout the body. White blood cells. These help you fight infections. If you have hemophilia or another clotting disorder, you may also receive other types of blood products. Tell a health care provider about: Any blood disorders you have. Any previous reactions you have had during a blood transfusion. Any allergies you have. All medicines you are taking, including vitamins, herbs, eye drops, creams, and over-the-counter medicines. Any surgeries you have had. Any medical conditions you have, including any recent fever or cold symptoms. Whether you are pregnant or may be pregnant. What are the risks? Generally, this is a safe procedure. However, problems may occur. The most common problems include: A mild allergic reaction, such as red, swollen areas of skin (hives) and itching. Fever or chills. This may be the body's response to new blood cells received. This may occur during or up to 4 hours after the transfusion. More serious problems may include: Transfusion-associated circulatory overload (TACO), or too much fluid in the lungs. This may cause breathing problems. A serious allergic reaction, such as difficulty breathing or swelling around the face and lips. Transfusion-related acute lung injury (TRALI), which causes breathing difficulty and low oxygen in the blood. This can occur within hours of the transfusion or several days later. Iron overload. This can happen after receiving many blood transfusions over a period of time. Infection or virus being transmitted. This is rare because donated blood is carefully tested before it is given. Hemolytic transfusion reaction. This is rare. It happens when your body's defense system (immune system)tries to attack the new blood cells. Symptoms may include fever, chills, nausea, low blood pressure, and low back or chest pain. Transfusion-associated  graft-versus-host disease (TAGVHD). This is rare. It happens when donated cells attack your body's healthy tissues. What happens before the procedure? Medicines Ask your health care provider about: Changing or stopping your regular medicines. This is especially important if you are taking diabetes medicines or blood thinners. Taking medicines such as aspirin and ibuprofen. These medicines can thin your blood. Do not take these medicines unless your health care provider tells you to take them. Taking over-the-counter medicines, vitamins, herbs, and supplements. General instructions Follow instructions from your health care provider about eating and drinking restrictions. You will have a blood test to determine your blood type. This is necessary to know what kind of blood your body will accept and to match it to the donor blood. If you are going to have a planned surgery, you may be able to do an autologous blood donation. This may be done in case you need to have a transfusion. You will have your temperature, blood pressure, and pulse monitored before the transfusion. If you have had an allergic reaction to a transfusion in the past, you may be given medicine to help prevent a reaction. This medicine may be given to you by mouth (orally) or through an IV. Set aside time for the blood transfusion. This procedure generally takes 1-4 hours to complete. What happens during the procedure?  An IV will be inserted into one of your veins. The bag  of donated blood will be attached to your IV. The blood will then enter through your vein. Your temperature, blood pressure, and pulse will be monitored regularly during the transfusion. This monitoring is done to detect early signs of a transfusion reaction. Tell your nurse right away if you have any of these symptoms during the transfusion: Shortness of breath or trouble breathing. Chest or back pain. Fever or chills. Hives or itching. If you have any signs  or symptoms of a reaction, your transfusion will be stopped and you may be given medicine. When the transfusion is complete, your IV will be removed. Pressure may be applied to the IV site for a few minutes. A bandage (dressing)will be applied. The procedure may vary among health care providers and hospitals. What happens after the procedure? Your temperature, blood pressure, pulse, breathing rate, and blood oxygen level will be monitored until you leave the hospital or clinic. Your blood may be tested to see how you are responding to the transfusion. You may be warmed with fluids or blankets to maintain a normal body temperature. If you receive your blood transfusion in an outpatient setting, you will be told whom to contact to report any reactions. Where to find more information For more information on blood transfusions, visit the American Red Cross: redcross.org Summary A blood transfusion is a procedure in which you receive blood or a type of blood cell (blood component) through an IV. The blood you receive may come from a donor or be donated by yourself (autologous blood donation) before a planned surgery. The blood given in a transfusion is made up of different blood components. You may receive red blood cells, platelets, plasma, or white blood cells depending on the condition treated. Your temperature, blood pressure, and pulse will be monitored before, during, and after the transfusion. After the transfusion, your blood may be tested to see how your body has responded. This information is not intended to replace advice given to you by your health care provider. Make sure you discuss any questions you have with your health care provider. Document Revised: 07/08/2019 Document Reviewed: 02/25/2019 Elsevier Patient Education  [place].

## 2021-08-18 LAB — TYPE AND SCREEN
ABO/RH(D): A POS
Antibody Screen: NEGATIVE
Unit division: 0

## 2021-08-18 LAB — BPAM RBC
Blood Product Expiration Date: 202212202359
ISSUE DATE / TIME: 202212021227
Unit Type and Rh: 5100

## 2021-08-18 LAB — CA 125: Cancer Antigen (CA) 125: 39.4 U/mL — ABNORMAL HIGH (ref 0.0–38.1)

## 2021-08-20 ENCOUNTER — Other Ambulatory Visit: Payer: Self-pay

## 2021-08-20 ENCOUNTER — Inpatient Hospital Stay: Payer: BC Managed Care – PPO

## 2021-08-20 DIAGNOSIS — C569 Malignant neoplasm of unspecified ovary: Secondary | ICD-10-CM

## 2021-08-20 DIAGNOSIS — T451X5A Adverse effect of antineoplastic and immunosuppressive drugs, initial encounter: Secondary | ICD-10-CM

## 2021-08-20 DIAGNOSIS — C561 Malignant neoplasm of right ovary: Secondary | ICD-10-CM | POA: Diagnosis not present

## 2021-08-20 MED ORDER — PEGFILGRASTIM-CBQV 6 MG/0.6ML ~~LOC~~ SOSY
6.0000 mg | PREFILLED_SYRINGE | Freq: Once | SUBCUTANEOUS | Status: AC
  Administered 2021-08-20: 6 mg via SUBCUTANEOUS
  Filled 2021-08-20: qty 0.6

## 2021-08-22 ENCOUNTER — Ambulatory Visit
Admission: RE | Admit: 2021-08-22 | Discharge: 2021-08-22 | Disposition: A | Discharge status: Home / Self Care | Payer: BC Managed Care – PPO | Source: Ambulatory Visit | Attending: Oncology | Admitting: Oncology

## 2021-08-22 ENCOUNTER — Other Ambulatory Visit: Payer: Self-pay

## 2021-08-22 DIAGNOSIS — R180 Malignant ascites: Secondary | ICD-10-CM | POA: Insufficient documentation

## 2021-08-22 DIAGNOSIS — C569 Malignant neoplasm of unspecified ovary: Secondary | ICD-10-CM | POA: Diagnosis not present

## 2021-08-22 NOTE — Procedures (Signed)
PROCEDURE SUMMARY:  Successful US guided paracentesis from RLQ.  Yielded 4 liters of amber colored fluid.  No immediate complications.  Pt tolerated well.   Specimen was not sent for labs.  EBL < 44mL  Tsosie Billing D PA-C 08/22/2021 4:30 PM

## 2021-08-24 ENCOUNTER — Encounter: Payer: Self-pay | Admitting: Oncology

## 2021-08-24 ENCOUNTER — Other Ambulatory Visit: Payer: Self-pay | Admitting: Oncology

## 2021-08-24 DIAGNOSIS — R180 Malignant ascites: Secondary | ICD-10-CM

## 2021-08-24 DIAGNOSIS — D649 Anemia, unspecified: Secondary | ICD-10-CM

## 2021-08-24 DIAGNOSIS — T451X5A Adverse effect of antineoplastic and immunosuppressive drugs, initial encounter: Secondary | ICD-10-CM

## 2021-08-24 DIAGNOSIS — Z5111 Encounter for antineoplastic chemotherapy: Secondary | ICD-10-CM

## 2021-08-24 DIAGNOSIS — I2699 Other pulmonary embolism without acute cor pulmonale: Secondary | ICD-10-CM

## 2021-08-24 DIAGNOSIS — C561 Malignant neoplasm of right ovary: Secondary | ICD-10-CM

## 2021-08-24 MED ORDER — CLONAZEPAM 0.5 MG PO TABS
0.5000 mg | ORAL_TABLET | Freq: Every day | ORAL | 0 refills | Status: DC | PRN
Start: 2021-08-24 — End: 2021-09-07

## 2021-08-24 MED ORDER — APIXABAN 5 MG PO TABS: 5.0000 mg | ORAL_TABLET | Freq: Two times a day (BID) | ORAL | 0 refills | Status: DC

## 2021-08-24 MED ORDER — DULOXETINE HCL 30 MG PO CPEP: 30.0000 mg | ORAL_CAPSULE | Freq: Every day | ORAL | 1 refills | Status: DC

## 2021-08-24 NOTE — Telephone Encounter (Signed)
Please advise 

## 2021-08-28 ENCOUNTER — Other Ambulatory Visit: Payer: Self-pay

## 2021-08-28 DIAGNOSIS — R180 Malignant ascites: Secondary | ICD-10-CM

## 2021-08-29 ENCOUNTER — Ambulatory Visit
Admission: RE | Admit: 2021-08-29 | Discharge: 2021-08-29 | Disposition: A | Discharge status: Home / Self Care | Payer: BC Managed Care – PPO | Source: Ambulatory Visit | Attending: Oncology | Admitting: Oncology

## 2021-08-29 DIAGNOSIS — A419 Sepsis, unspecified organism: Secondary | ICD-10-CM | POA: Diagnosis not present

## 2021-08-29 DIAGNOSIS — R1084 Generalized abdominal pain: Secondary | ICD-10-CM | POA: Diagnosis not present

## 2021-08-29 DIAGNOSIS — R180 Malignant ascites: Secondary | ICD-10-CM | POA: Insufficient documentation

## 2021-08-29 NOTE — Procedures (Signed)
PROCEDURE SUMMARY:  Successful US guided paracentesis from LLQ.  Yielded 4 L of amber colored fluid.  No immediate complications.  Pt tolerated well.   Specimen was not sent for labs.  EBL None.  Tsosie Billing D PA-C 08/29/2021 2:13 PM

## 2021-08-30 ENCOUNTER — Inpatient Hospital Stay: Payer: BC Managed Care – PPO

## 2021-08-30 ENCOUNTER — Other Ambulatory Visit: Payer: Self-pay

## 2021-08-30 ENCOUNTER — Inpatient Hospital Stay (HOSPITAL_BASED_OUTPATIENT_CLINIC_OR_DEPARTMENT_OTHER): Payer: BC Managed Care – PPO | Admitting: Oncology

## 2021-08-30 ENCOUNTER — Telehealth: Payer: Self-pay | Admitting: *Deleted

## 2021-08-30 ENCOUNTER — Ambulatory Visit: Payer: BC Managed Care – PPO | Admitting: Hospice and Palliative Medicine

## 2021-08-30 ENCOUNTER — Other Ambulatory Visit: Payer: BC Managed Care – PPO

## 2021-08-30 ENCOUNTER — Ambulatory Visit: Payer: BC Managed Care – PPO

## 2021-08-30 VITALS — BP 120/84 | HR 124 | Temp 100.1°F | Resp 18

## 2021-08-30 DIAGNOSIS — R110 Nausea: Secondary | ICD-10-CM

## 2021-08-30 DIAGNOSIS — C561 Malignant neoplasm of right ovary: Secondary | ICD-10-CM

## 2021-08-30 DIAGNOSIS — D696 Thrombocytopenia, unspecified: Secondary | ICD-10-CM | POA: Diagnosis not present

## 2021-08-30 DIAGNOSIS — Z95828 Presence of other vascular implants and grafts: Secondary | ICD-10-CM

## 2021-08-30 LAB — CBC WITH DIFFERENTIAL/PLATELET
Abs Immature Granulocytes: 0.17 10*3/uL — ABNORMAL HIGH (ref 0.00–0.07)
Basophils Absolute: 0.2 10*3/uL — ABNORMAL HIGH (ref 0.0–0.1)
Basophils Relative: 1 %
Eosinophils Absolute: 0 10*3/uL (ref 0.0–0.5)
Eosinophils Relative: 0 %
HCT: 29 % — ABNORMAL LOW (ref 36.0–46.0)
Hemoglobin: 10.2 g/dL — ABNORMAL LOW (ref 12.0–15.0)
Immature Granulocytes: 1 %
Lymphocytes Relative: 5 %
Lymphs Abs: 0.9 10*3/uL (ref 0.7–4.0)
MCH: 34.9 pg — ABNORMAL HIGH (ref 26.0–34.0)
MCHC: 35.2 g/dL (ref 30.0–36.0)
MCV: 99.3 fL (ref 80.0–100.0)
Monocytes Absolute: 0.9 10*3/uL (ref 0.1–1.0)
Monocytes Relative: 5 %
Neutro Abs: 15.6 10*3/uL — ABNORMAL HIGH (ref 1.7–7.7)
Neutrophils Relative %: 88 %
Platelets: 39 10*3/uL — ABNORMAL LOW (ref 150–400)
RBC: 2.92 MIL/uL — ABNORMAL LOW (ref 3.87–5.11)
RDW: 16.4 % — ABNORMAL HIGH (ref 11.5–15.5)
Smear Review: NORMAL
WBC: 17.8 10*3/uL — ABNORMAL HIGH (ref 4.0–10.5)
nRBC: 0 % (ref 0.0–0.2)

## 2021-08-30 LAB — COMPREHENSIVE METABOLIC PANEL
ALT: 25 U/L (ref 0–44)
AST: 36 U/L (ref 15–41)
Albumin: 2.6 g/dL — ABNORMAL LOW (ref 3.5–5.0)
Alkaline Phosphatase: 217 U/L — ABNORMAL HIGH (ref 38–126)
Anion gap: 14 (ref 5–15)
BUN: 11 mg/dL (ref 8–23)
CO2: 25 mmol/L (ref 22–32)
Calcium: 9.2 mg/dL (ref 8.9–10.3)
Chloride: 90 mmol/L — ABNORMAL LOW (ref 98–111)
Creatinine, Ser: 0.86 mg/dL (ref 0.44–1.00)
GFR, Estimated: >60 mL/min (ref >60)
Glucose, Bld: 108 mg/dL — ABNORMAL HIGH (ref 70–99)
Potassium: 3.9 mmol/L (ref 3.5–5.1)
Sodium: 129 mmol/L — ABNORMAL LOW (ref 135–145)
Total Bilirubin: 0.6 mg/dL (ref 0.3–1.2)
Total Protein: 6.4 g/dL — ABNORMAL LOW (ref 6.5–8.1)

## 2021-08-30 LAB — IMMATURE PLATELET FRACTION: Immature Platelet Fraction: 10.5 % — ABNORMAL HIGH (ref 1.2–8.6)

## 2021-08-30 MED ORDER — SODIUM CHLORIDE 0.9 % IV SOLN
INTRAVENOUS | Status: AC
  Filled 2021-08-30: qty 250

## 2021-08-30 MED ORDER — SODIUM CHLORIDE 0.9 % IV SOLN: 10.0000 mg | Freq: Once | INTRAVENOUS | Status: DC

## 2021-08-30 MED ORDER — SODIUM CHLORIDE 0.9 % IV SOLN: 8.0000 mg | Freq: Once | INTRAVENOUS | Status: DC

## 2021-08-30 MED ORDER — HEPARIN SOD (PORK) LOCK FLUSH 100 UNIT/ML IV SOLN
500.0000 [IU] | Freq: Once | INTRAVENOUS | Status: AC
  Administered 2021-08-30: 500 [IU] via INTRAVENOUS
  Filled 2021-08-30: qty 5

## 2021-08-30 MED ORDER — ONDANSETRON HCL 4 MG/2ML IJ SOLN
8.0000 mg | Freq: Once | INTRAMUSCULAR | Status: AC
  Administered 2021-08-30: 8 mg via INTRAVENOUS
  Filled 2021-08-30: qty 4

## 2021-08-30 MED ORDER — DEXAMETHASONE SODIUM PHOSPHATE 10 MG/ML IJ SOLN
10.0000 mg | Freq: Once | INTRAMUSCULAR | Status: AC
  Administered 2021-08-30: 10 mg via INTRAVENOUS
  Filled 2021-08-30: qty 1

## 2021-08-30 MED ORDER — SODIUM CHLORIDE 0.9% FLUSH
10.0000 mL | Freq: Once | INTRAVENOUS | Status: DC
  Filled 2021-08-30: qty 10

## 2021-08-30 NOTE — Progress Notes (Signed)
Notified Symptom Management that patient having uncontrolled vomiting after trying anti-emetics. She is coming to clinic today at 2:30 per Jonnie Finner.  Notified patient and husband.

## 2021-08-30 NOTE — Telephone Encounter (Signed)
Patient husband called reporting that patient started vomiting during the night and into this morning. They were trying to get fluids into her and now she is vomiting again and he is asking what to do. Please advise

## 2021-08-30 NOTE — Progress Notes (Signed)
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  Patient is status post large-volume paracentesis.  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  Dr. Tasia Catchings last saw patient 08/17/2021 and at that time patient had nausea and bilateral upper quadrant pain, which was relieved after large-volume paracentesis.  Patient was also found to have symptomatic anemia and received PRBC transfusion.  Patient received cycle 6 carbo/Abraxane on 08/17/2021.    Patient is pending CT of the chest, abdomen, and pelvis on 09/03/2021  Patient presents to [facility] with nausea and vomiting. Patient underwent paracentesis yesterday with 4 L removed.  She says that there was significant amount of mashing on her abdomen to get residual fluid removed.  She says that she has since had abdominal cramping and nausea and vomiting.  She has been unable to tolerate much food or fluids today.  She has tried taking antiemetics but has had subsequent vomiting.  She denies fever or chills.  She reports having a regular bowel movement this morning and denies diarrhea or constipation.  She denies recurrent abdominal distention.  No GU symptoms.  Denies any neurologic complaints. Denies recent fevers or illnesses. Denies any easy bleeding or bruising. Denies chest pain. Denies urinary complaints. Patient offers no further specific complaints today.  PAST MEDICAL  HISTORY: Past Medical History:  Diagnosis Date   Anxiety    Arthritis    Ascites    Cirrhosis of liver ([place])    DDD (degenerative disc disease), cervical    Depression    Family history of breast cancer    GERD (gastroesophageal reflux disease)    Glaucoma    Headache    migraines   Hypertension    Ovarian cancer ([place]) 04/15/2021   PONV (postoperative nausea and vomiting)    Pulmonary embolism ([place])    Sleep apnea    Venous stasis     PAST SURGICAL HISTORY:  Past Surgical History:  Procedure Laterality Date   ABDOMINAL HYSTERECTOMY N/A 2008   may have had left ovary removed   [place]     2012, 2015, 2020   COLONOSCOPY N/A 06/29/2021   Procedure: COLONOSCOPY;  Surgeon: Toledo, Benay Pike, MD;  Location: [facility] ENDOSCOPY;  Service: Gastroenterology;  Laterality: N/A;  DOCTORS ARE IN AGREEMENT THAT TOLEDO CAN DO THIS PROCEDURE IN RUSSO'S BLOCK   ESOPHAGOGASTRODUODENOSCOPY N/A 06/29/2021   Procedure: ESOPHAGOGASTRODUODENOSCOPY (EGD);  Surgeon: Toledo, Benay Pike, MD;  Location: [facility] ENDOSCOPY;  Service: Gastroenterology;  Laterality: N/A;   PARACENTESIS  04/20/2021   PILONIDAL CYST / SINUS EXCISION     PORTACATH PLACEMENT Right 06/01/2021   Procedure: INSERTION PORT-A-CATH;  Surgeon: Herbert Pun, MD;  Location: [facility] ORS;  Service: General;  Laterality: Right;    HEMATOLOGY/ONCOLOGY HISTORY:  Oncology History  Ovarian cancer ([place])  04/15/2021 Initial Diagnosis   Ovarian cancer ([place])   04/18/2021 Cancer Staging   Staging form: Ovary, Fallopian Tube, and  Primary Peritoneal Carcinoma, AJCC 8th Edition - Clinical stage from 04/18/2021: FIGO Stage II, calculated as Stage Unknown (cT2, cNX, cM0) - Signed by Earlie Server, MD on 04/19/2021 Stage prefix: Initial diagnosis   04/19/2021 -  Chemotherapy   Patient is on Treatment Plan : OVARIAN Carboplatin (AUC 6) / Paclitaxel (175) q21d x 6 cycles      Genetic Testing    Negative genetic testing. No pathogenic variants identified on the [facility] panel. The report date is 06/25/2021.  The [facility] gene panel offered by [employer] includes sequencing and deletion/duplication testing of the following 47 genes:  APC, ATM, AXIN2, BAP1, BARD1, BMPR1A, BRIP1, BRCA1, BRCA2, CDH1, CDK4, CDKN2A, CHEK2, CTNNA1, FH, FLCN, HOXB13 (seq only), MEN1, MET, MLH1, MSH2, MSH3 (excluding repetitive portions of exon 1), MSH6, MUTYH, NTHL1, PALB2, PMS2, PTEN, RAD51C, RAD51D, SDHA, SDHB, SDHC, SDHD, SMAD4, STK11, TP53, TSC1, TSC2, VHL.  [employer] CDx HRD was also negative.      ALLERGIES:  is allergic to paclitaxel, amlodipine, and penicillins.  MEDICATIONS:  Current Outpatient Medications  Medication Sig Dispense Refill   acetaminophen (TYLENOL) 500 MG tablet Take 1,000 mg by mouth at bedtime.     albuterol (VENTOLIN HFA) 108 (90 Base) MCG/ACT inhaler Inhale 1-2 puffs into the lungs every 6 (six) hours as needed for wheezing or shortness of breath.     apixaban (ELIQUIS) 5 MG TABS tablet Take 1 tablet (5 mg total) by mouth 2 (two) times daily. 60 tablet 0   clonazePAM (KLONOPIN) 0.5 MG tablet Take 1 tablet (0.5 mg total) by mouth daily as needed for anxiety. 30 tablet 0   dexamethasone (DECADRON) 4 MG tablet Take 2 tablets (8 mg total) by mouth See admin instructions. Take 60m daily for 2 days prior to your chemotherapy 30 tablet 0   DULoxetine (CYMBALTA) 30 MG capsule Take 1 capsule (30 mg total) by mouth daily. 30 capsule 1   hydrochlorothiazide (HYDRODIURIL) 12.5 MG tablet Take 12.5 mg by mouth daily.     hydrocortisone butyrate (LUCOID) 0.1 % CREA cream Apply 1 application topically daily. 45 g 0   latanoprost (XALATAN) 0.005 % ophthalmic solution Place 1 drop into both eyes at bedtime.     lidocaine-prilocaine (EMLA) cream Apply to affected area once (Patient taking differently: Apply 1 application topically daily as needed (pain). Apply to affected area  once) 30 g 3   losartan (COZAAR) 50 MG tablet Take 50 mg by mouth daily.     Multiple Vitamin (MULTIVITAMIN WITH MINERALS) TABS tablet Take 1 tablet by mouth daily.     nystatin (MYCOSTATIN) 100000 UNIT/ML suspension Use as directed 5 mLs (500,000 Units total) in the mouth or throat 4 (four) times daily. 473 mL 0   ondansetron (ZOFRAN) 8 MG tablet Take 1 tablet (8 mg total) by mouth 2 (two) times daily as needed for refractory nausea / vomiting. Start on day 3 after carboplatin chemo. 30 tablet 1   potassium chloride SA (KLOR-CON) 20 MEQ tablet Take 1 tablet (20 mEq total) by mouth 2 (two) times daily. 60 tablet 0   prochlorperazine (COMPAZINE) 10 MG tablet Take 1 tablet (10 mg total) by mouth every 6 (six) hours as needed (Nausea or vomiting). 30 tablet 1   timolol (TIMOPTIC) 0.5 % ophthalmic solution Place 1 drop into both eyes 2 (two) times daily.     traMADol (ULTRAM) 50 MG tablet Take 50 mg by mouth every 6 (six) hours as needed.     triamcinolone cream (KENALOG) 0.1 %  Apply 1 application topically daily as needed (irritation).     No current facility-administered medications for this visit.   Facility-Administered Medications Ordered in Other Visits  Medication Dose Route Frequency Provider Last Rate Last Admin   LORazepam (ATIVAN) injection 0.5 mg  0.5 mg Intravenous Once PRN Earlie Server, MD        VITAL SIGNS: There were no vitals taken for this visit. There were no vitals filed for this visit.  Estimated body mass index is 35.92 kg/m as calculated from the following:   Height as of 06/29/21: '5\' 1"'  (1.549 m).   Weight as of 08/17/21: 190 lb 1.6 oz (86.2 kg).  LABS: CBC:    Component Value Date/Time   WBC 26.0 (H) 08/17/2021 0828   HGB 7.6 (L) 08/17/2021 0828   HCT 22.6 (L) 08/17/2021 0828   PLT 141 (L) 08/17/2021 0828   MCV 104.1 (H) 08/17/2021 0828   NEUTROABS 22.7 (H) 08/17/2021 0828   LYMPHSABS 1.5 08/17/2021 0828   MONOABS 1.2 (H) 08/17/2021 0828   EOSABS 0.0 08/17/2021  0828   BASOSABS 0.1 08/17/2021 0828   Comprehensive Metabolic Panel:    Component Value Date/Time   NA 132 (L) 08/17/2021 0828   K 3.6 08/17/2021 0828   CL 99 08/17/2021 0828   CO2 22 08/17/2021 0828   BUN 16 08/17/2021 0828   CREATININE 1.02 (H) 08/17/2021 0828   GLUCOSE 132 (H) 08/17/2021 0828   CALCIUM 9.1 08/17/2021 0828   AST 35 08/17/2021 0828   ALT 19 08/17/2021 0828   ALKPHOS 170 (H) 08/17/2021 0828   BILITOT 0.2 (L) 08/17/2021 0828   PROT 6.0 (L) 08/17/2021 0828   ALBUMIN 2.6 (L) 08/17/2021 0828    RADIOGRAPHIC STUDIES: US Paracentesis  Result Date: 08/29/2021 INDICATION: Patient with history of ovarian cancer recurrent ascites request for paracentesis. EXAM: ULTRASOUND GUIDED  PARACENTESIS MEDICATIONS: Local 1% lidocaine only. COMPLICATIONS: None immediate. PROCEDURE: Informed written consent was obtained from the patient after a discussion of the risks, benefits and alternatives to treatment. A timeout was performed prior to the initiation of the procedure. Initial ultrasound scanning demonstrates a large amount of ascites within the left lower abdominal quadrant. The left lower abdomen was prepped and draped in the usual sterile fashion. 1% lidocaine was used for local anesthesia. Following this, a 19 gauge, 7-cm, Yueh catheter was introduced. An ultrasound image was saved for documentation purposes. The paracentesis was performed. The catheter was removed and a dressing was applied. The patient tolerated the procedure well without immediate post procedural complication. FINDINGS: A total of approximately 4 L of amber colored fluid was removed. IMPRESSION: Successful ultrasound-guided paracentesis yielding 4 liters of peritoneal fluid. This exam was performed by Tsosie Billing PA-C, and was supervised and interpreted by Dr. Vernard Gambles. Electronically Signed   By: Lucrezia Europe M.D.   On: 08/29/2021 16:23   US Paracentesis  Result Date: 08/22/2021 INDICATION: Patient with history of  ovarian cancer and recurrent ascites request for paracentesis. EXAM: ULTRASOUND GUIDED  PARACENTESIS MEDICATIONS: Local 1% lidocaine only. COMPLICATIONS: None immediate. PROCEDURE: Informed written consent was obtained from the patient after a discussion of the risks, benefits and alternatives to treatment. A timeout was performed prior to the initiation of the procedure. Initial ultrasound scanning demonstrates a large amount of ascites within the right lower abdominal quadrant. The right lower abdomen was prepped and draped in the usual sterile fashion. 1% lidocaine was used for local anesthesia. Following this, a 19 gauge, 7-cm, Yueh catheter was introduced.  An ultrasound image was saved for documentation purposes. The paracentesis was performed. The catheter was removed and a dressing was applied. The patient tolerated the procedure well without immediate post procedural complication. FINDINGS: A total of approximately 4 liters of amber colored fluid was removed. IMPRESSION: Successful ultrasound-guided paracentesis yielding 4 liters of peritoneal fluid. This exam was performed by Tsosie Billing PA-C, and was supervised and interpreted by Dr. Pascal Lux. Electronically Signed   By: Sandi Mariscal M.D.   On: 08/22/2021 16:38   US Paracentesis  Result Date: 08/16/2021 INDICATION: 60 year old female with history of ovarian cancer with recurrent ascites presenting for paracentesis. EXAM: ULTRASOUND GUIDED  PARACENTESIS MEDICATIONS: None. COMPLICATIONS: None immediate. PROCEDURE: Informed written consent was obtained from the patient after a discussion of the risks, benefits and alternatives to treatment. A timeout was performed prior to the initiation of the procedure. Initial ultrasound scanning demonstrates a large amount of ascites within the right lower abdominal quadrant. The right lower abdomen was prepped and draped in the usual sterile fashion. 1% lidocaine was used for local anesthesia. Following this, a 6 Fr  Safe-T-Centesis catheter was introduced. An ultrasound image was saved for documentation purposes. The paracentesis was performed. The catheter was removed and a dressing was applied. The patient tolerated the procedure well without immediate post procedural complication. FINDINGS: A total of approximately 4 of translucent, amber fluid was removed. IMPRESSION: Successful ultrasound-guided paracentesis yielding 4 liters of peritoneal fluid. Ruthann Cancer, MD [facility] Electronically Signed   By: Ruthann Cancer M.D.   On: 08/16/2021 16:25    PERFORMANCE STATUS (ECOG) : 1 - Symptomatic but completely ambulatory  Review of Systems Unless otherwise noted, a complete review of systems is negative.  Physical Exam General: NAD Cardiac: RRR Pulmonary: Clear anterior/posterior fields Abdomen: Soft, nontender to palpation, bowel sounds hypoactive but present GU: No SP tenderness Extremities: Trace bilateral lower extremity edema, no joint deformities Skin: no rashes Neurological: Weakness but otherwise nonfocal   Assessment and Plan- Patient is a 60 y.o. 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  Patient is status post large-volume paracentesis.  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  [facility] was requested for evaluation of nausea/vomiting   Nausea/Vomiting -low suspicion for malignant obstruction as patient had regular bowel movement this morning and is passing gas.  I discussed moving forward her CT, which is currently scheduled for next week.  However, patient does not think that she would tolerate getting a CT right now.  We will proceed with IV fluids, IV Zofran, and dexamethasone.  I would have a low threshold for sending her to the ER for further evaluation if needed.  ER triggers were  discussed with patient.  Thrombocytopenia -discussed with Dr. Tasia Catchings and will plan for repeat labs tomorrow with possible transfusion of platelets if needed.  Patient instructed to hold Eliquis  Case and plan discussed with Dr. Tasia Catchings.  Patient will return tomorrow for repeat labs/fluids and to see Dr. Tasia Catchings   Patient expressed understanding and was in agreement with this plan. She also understands that She can call clinic at any time with any questions, concerns, or complaints.   Thank you for allowing me to participate in the care of this very pleasant patient.   Time Total: 20 minutes  Visit consisted of counseling and education dealing with the complex and emotionally intense issues of  symptom management in the setting of serious illness.Greater than 50%  of this time was spent counseling and coordinating care related to the above assessment and plan.  Signed by: Altha Harm, PhD, NP-C

## 2021-08-30 NOTE — Progress Notes (Signed)
Pt woke up at 4am with nausea and vomiting. No vomiting since 11am today. Had paracentesis yesterday.

## 2021-08-31 ENCOUNTER — Inpatient Hospital Stay: Payer: BC Managed Care – PPO

## 2021-08-31 ENCOUNTER — Encounter: Payer: Self-pay | Admitting: Emergency Medicine

## 2021-08-31 ENCOUNTER — Inpatient Hospital Stay (HOSPITAL_BASED_OUTPATIENT_CLINIC_OR_DEPARTMENT_OTHER): Payer: BC Managed Care – PPO | Admitting: Oncology

## 2021-08-31 ENCOUNTER — Emergency Department: Payer: BC Managed Care – PPO

## 2021-08-31 ENCOUNTER — Other Ambulatory Visit: Payer: Self-pay

## 2021-08-31 ENCOUNTER — Inpatient Hospital Stay
Admission: EM | Admit: 2021-08-31 | Discharge: 2021-09-04 | DRG: 871 | Disposition: A | Discharge status: Home / Self Care | Payer: BC Managed Care – PPO | Attending: Internal Medicine | Admitting: Internal Medicine

## 2021-08-31 ENCOUNTER — Encounter: Payer: Self-pay | Admitting: Oncology

## 2021-08-31 VITALS — BP 168/86 | HR 119 | Temp 97.6°F | Wt 188.8 lb

## 2021-08-31 DIAGNOSIS — Z5111 Encounter for antineoplastic chemotherapy: Secondary | ICD-10-CM

## 2021-08-31 DIAGNOSIS — D539 Nutritional anemia, unspecified: Secondary | ICD-10-CM | POA: Diagnosis present

## 2021-08-31 DIAGNOSIS — F32A Depression, unspecified: Secondary | ICD-10-CM | POA: Diagnosis present

## 2021-08-31 DIAGNOSIS — D6959 Other secondary thrombocytopenia: Secondary | ICD-10-CM | POA: Diagnosis present

## 2021-08-31 DIAGNOSIS — G4730 Sleep apnea, unspecified: Secondary | ICD-10-CM | POA: Diagnosis present

## 2021-08-31 DIAGNOSIS — R180 Malignant ascites: Secondary | ICD-10-CM | POA: Diagnosis present

## 2021-08-31 DIAGNOSIS — R1084 Generalized abdominal pain: Secondary | ICD-10-CM | POA: Diagnosis present

## 2021-08-31 DIAGNOSIS — I10 Essential (primary) hypertension: Secondary | ICD-10-CM | POA: Diagnosis present

## 2021-08-31 DIAGNOSIS — K7469 Other cirrhosis of liver: Secondary | ICD-10-CM | POA: Diagnosis present

## 2021-08-31 DIAGNOSIS — M5030 Other cervical disc degeneration, unspecified cervical region: Secondary | ICD-10-CM | POA: Diagnosis present

## 2021-08-31 DIAGNOSIS — D696 Thrombocytopenia, unspecified: Secondary | ICD-10-CM | POA: Diagnosis present

## 2021-08-31 DIAGNOSIS — R6520 Severe sepsis without septic shock: Secondary | ICD-10-CM | POA: Diagnosis present

## 2021-08-31 DIAGNOSIS — K652 Spontaneous bacterial peritonitis: Secondary | ICD-10-CM | POA: Diagnosis present

## 2021-08-31 DIAGNOSIS — Z880 Allergy status to penicillin: Secondary | ICD-10-CM

## 2021-08-31 DIAGNOSIS — D72823 Leukemoid reaction: Secondary | ICD-10-CM | POA: Diagnosis present

## 2021-08-31 DIAGNOSIS — C569 Malignant neoplasm of unspecified ovary: Secondary | ICD-10-CM | POA: Diagnosis present

## 2021-08-31 DIAGNOSIS — R109 Unspecified abdominal pain: Secondary | ICD-10-CM

## 2021-08-31 DIAGNOSIS — I700 Atherosclerosis of aorta: Secondary | ICD-10-CM | POA: Diagnosis present

## 2021-08-31 DIAGNOSIS — M1990 Unspecified osteoarthritis, unspecified site: Secondary | ICD-10-CM | POA: Diagnosis present

## 2021-08-31 DIAGNOSIS — R112 Nausea with vomiting, unspecified: Secondary | ICD-10-CM | POA: Diagnosis not present

## 2021-08-31 DIAGNOSIS — F418 Other specified anxiety disorders: Secondary | ICD-10-CM | POA: Diagnosis present

## 2021-08-31 DIAGNOSIS — Z6835 Body mass index (BMI) 35.0-35.9, adult: Secondary | ICD-10-CM

## 2021-08-31 DIAGNOSIS — Z86711 Personal history of pulmonary embolism: Secondary | ICD-10-CM | POA: Diagnosis not present

## 2021-08-31 DIAGNOSIS — A419 Sepsis, unspecified organism: Secondary | ICD-10-CM | POA: Diagnosis present

## 2021-08-31 DIAGNOSIS — E669 Obesity, unspecified: Secondary | ICD-10-CM | POA: Diagnosis present

## 2021-08-31 DIAGNOSIS — C561 Malignant neoplasm of right ovary: Secondary | ICD-10-CM

## 2021-08-31 DIAGNOSIS — N179 Acute kidney failure, unspecified: Secondary | ICD-10-CM | POA: Diagnosis present

## 2021-08-31 DIAGNOSIS — T451X5A Adverse effect of antineoplastic and immunosuppressive drugs, initial encounter: Secondary | ICD-10-CM | POA: Diagnosis present

## 2021-08-31 DIAGNOSIS — E8809 Other disorders of plasma-protein metabolism, not elsewhere classified: Secondary | ICD-10-CM | POA: Diagnosis not present

## 2021-08-31 DIAGNOSIS — E871 Hypo-osmolality and hyponatremia: Secondary | ICD-10-CM | POA: Diagnosis present

## 2021-08-31 DIAGNOSIS — D649 Anemia, unspecified: Secondary | ICD-10-CM | POA: Diagnosis present

## 2021-08-31 DIAGNOSIS — Z888 Allergy status to other drugs, medicaments and biological substances status: Secondary | ICD-10-CM

## 2021-08-31 DIAGNOSIS — Z7901 Long term (current) use of anticoagulants: Secondary | ICD-10-CM

## 2021-08-31 DIAGNOSIS — Z803 Family history of malignant neoplasm of breast: Secondary | ICD-10-CM | POA: Diagnosis not present

## 2021-08-31 DIAGNOSIS — K219 Gastro-esophageal reflux disease without esophagitis: Secondary | ICD-10-CM | POA: Diagnosis present

## 2021-08-31 DIAGNOSIS — Z20822 Contact with and (suspected) exposure to covid-19: Secondary | ICD-10-CM | POA: Diagnosis present

## 2021-08-31 DIAGNOSIS — I2699 Other pulmonary embolism without acute cor pulmonale: Secondary | ICD-10-CM

## 2021-08-31 DIAGNOSIS — K5900 Constipation, unspecified: Secondary | ICD-10-CM | POA: Diagnosis not present

## 2021-08-31 DIAGNOSIS — H409 Unspecified glaucoma: Secondary | ICD-10-CM | POA: Diagnosis present

## 2021-08-31 DIAGNOSIS — C786 Secondary malignant neoplasm of retroperitoneum and peritoneum: Secondary | ICD-10-CM | POA: Diagnosis present

## 2021-08-31 DIAGNOSIS — D72828 Other elevated white blood cell count: Secondary | ICD-10-CM | POA: Diagnosis not present

## 2021-08-31 DIAGNOSIS — K5903 Drug induced constipation: Secondary | ICD-10-CM

## 2021-08-31 DIAGNOSIS — F419 Anxiety disorder, unspecified: Secondary | ICD-10-CM | POA: Diagnosis present

## 2021-08-31 LAB — LACTATE DEHYDROGENASE, PLEURAL OR PERITONEAL FLUID: LD, Fluid: 1174 U/L — ABNORMAL HIGH (ref 3–23)

## 2021-08-31 LAB — PROTIME-INR
INR: 1.1 (ref 0.8–1.2)
Prothrombin Time: 14.6 seconds (ref 11.4–15.2)

## 2021-08-31 LAB — CBC WITH DIFFERENTIAL/PLATELET
Abs Immature Granulocytes: 0.17 10*3/uL — ABNORMAL HIGH (ref 0.00–0.07)
Basophils Absolute: 0 10*3/uL (ref 0.0–0.1)
Basophils Relative: 0 %
Eosinophils Absolute: 0 10*3/uL (ref 0.0–0.5)
Eosinophils Relative: 0 %
HCT: 28.9 % — ABNORMAL LOW (ref 36.0–46.0)
Hemoglobin: 9.9 g/dL — ABNORMAL LOW (ref 12.0–15.0)
Immature Granulocytes: 1 %
Lymphocytes Relative: 5 %
Lymphs Abs: 1.1 10*3/uL (ref 0.7–4.0)
MCH: 34.1 pg — ABNORMAL HIGH (ref 26.0–34.0)
MCHC: 34.3 g/dL (ref 30.0–36.0)
MCV: 99.7 fL (ref 80.0–100.0)
Monocytes Absolute: 1 10*3/uL (ref 0.1–1.0)
Monocytes Relative: 5 %
Neutro Abs: 19.3 10*3/uL — ABNORMAL HIGH (ref 1.7–7.7)
Neutrophils Relative %: 89 %
Platelets: 31 10*3/uL — ABNORMAL LOW (ref 150–400)
RBC: 2.9 MIL/uL — ABNORMAL LOW (ref 3.87–5.11)
RDW: 16.9 % — ABNORMAL HIGH (ref 11.5–15.5)
Smear Review: NORMAL
WBC: 21.6 10*3/uL — ABNORMAL HIGH (ref 4.0–10.5)
nRBC: 0 % (ref 0.0–0.2)

## 2021-08-31 LAB — URINALYSIS, ROUTINE W REFLEX MICROSCOPIC
Bilirubin Urine: NEGATIVE
Glucose, UA: NEGATIVE mg/dL
Hgb urine dipstick: NEGATIVE
Ketones, ur: 5 mg/dL — AB
Leukocytes,Ua: NEGATIVE
Nitrite: NEGATIVE
Protein, ur: NEGATIVE mg/dL
Specific Gravity, Urine: >1.046 — ABNORMAL HIGH (ref 1.005–1.030)
pH: 5 (ref 5.0–8.0)

## 2021-08-31 LAB — COMPREHENSIVE METABOLIC PANEL
ALT: 25 U/L (ref 0–44)
AST: 37 U/L (ref 15–41)
Albumin: 2.6 g/dL — ABNORMAL LOW (ref 3.5–5.0)
Alkaline Phosphatase: 214 U/L — ABNORMAL HIGH (ref 38–126)
Anion gap: 13 (ref 5–15)
BUN: 19 mg/dL (ref 8–23)
CO2: 24 mmol/L (ref 22–32)
Calcium: 9 mg/dL (ref 8.9–10.3)
Chloride: 91 mmol/L — ABNORMAL LOW (ref 98–111)
Creatinine, Ser: 1.26 mg/dL — ABNORMAL HIGH (ref 0.44–1.00)
GFR, Estimated: 49 mL/min — ABNORMAL LOW (ref >60)
Glucose, Bld: 169 mg/dL — ABNORMAL HIGH (ref 70–99)
Potassium: 3.6 mmol/L (ref 3.5–5.1)
Sodium: 128 mmol/L — ABNORMAL LOW (ref 135–145)
Total Bilirubin: 0.6 mg/dL (ref 0.3–1.2)
Total Protein: 6.4 g/dL — ABNORMAL LOW (ref 6.5–8.1)

## 2021-08-31 LAB — OSMOLALITY, URINE: Osmolality, Ur: 560 mOsm/kg (ref 300–900)

## 2021-08-31 LAB — LACTIC ACID, PLASMA: Lactic Acid, Venous: 3.1 mmol/L (ref 0.5–1.9)

## 2021-08-31 LAB — BASIC METABOLIC PANEL
Anion gap: 8 (ref 5–15)
BUN: 17 mg/dL (ref 8–23)
CO2: 25 mmol/L (ref 22–32)
Calcium: 8.3 mg/dL — ABNORMAL LOW (ref 8.9–10.3)
Chloride: 97 mmol/L — ABNORMAL LOW (ref 98–111)
Creatinine, Ser: 1.03 mg/dL — ABNORMAL HIGH (ref 0.44–1.00)
GFR, Estimated: >60 mL/min (ref >60)
Glucose, Bld: 105 mg/dL — ABNORMAL HIGH (ref 70–99)
Potassium: 3.4 mmol/L — ABNORMAL LOW (ref 3.5–5.1)
Sodium: 130 mmol/L — ABNORMAL LOW (ref 135–145)

## 2021-08-31 LAB — PROCALCITONIN: Procalcitonin: 0.12 ng/mL

## 2021-08-31 LAB — RESP PANEL BY RT-PCR (FLU A&B, COVID) ARPGX2
Influenza A by PCR: NEGATIVE
Influenza B by PCR: NEGATIVE
SARS Coronavirus 2 by RT PCR: NEGATIVE

## 2021-08-31 LAB — OSMOLALITY: Osmolality: 277 mOsm/kg (ref 275–295)

## 2021-08-31 LAB — GLUCOSE, PLEURAL OR PERITONEAL FLUID: Glucose, Fluid: 69 mg/dL

## 2021-08-31 LAB — LIPASE, BLOOD: Lipase: 31 U/L (ref 11–51)

## 2021-08-31 LAB — SODIUM, URINE, RANDOM: Sodium, Ur: <10 mmol/L

## 2021-08-31 LAB — AMMONIA: Ammonia: 31 umol/L (ref 9–35)

## 2021-08-31 LAB — APTT: aPTT: 30 seconds (ref 24–36)

## 2021-08-31 LAB — HIV ANTIBODY (ROUTINE TESTING W REFLEX): HIV Screen 4th Generation wRfx: NONREACTIVE

## 2021-08-31 LAB — PROTEIN, PLEURAL OR PERITONEAL FLUID: Total protein, fluid: 3.6 g/dL

## 2021-08-31 MED ORDER — FAMOTIDINE 20 MG PO TABS
20.0000 mg | ORAL_TABLET | Freq: Every day | ORAL | Status: DC
  Administered 2021-08-31 – 2021-09-04 (×3): 20 mg via ORAL
  Filled 2021-08-31 (×4): qty 1

## 2021-08-31 MED ORDER — HYDRALAZINE HCL 20 MG/ML IJ SOLN: 5.0000 mg | INTRAMUSCULAR | Status: DC | PRN

## 2021-08-31 MED ORDER — ACETAMINOPHEN 325 MG PO TABS
650.0000 mg | ORAL_TABLET | Freq: Four times a day (QID) | ORAL | Status: DC | PRN
  Administered 2021-08-31: 23:00:00 650 mg via ORAL
  Filled 2021-08-31: qty 2

## 2021-08-31 MED ORDER — SODIUM CHLORIDE 0.9 % IV SOLN
2.0000 g | Freq: Once | INTRAVENOUS | Status: AC
  Administered 2021-08-31: 2 g via INTRAVENOUS
  Filled 2021-08-31: qty 2

## 2021-08-31 MED ORDER — LIDOCAINE-PRILOCAINE 2.5-2.5 % EX CREA
1.0000 "application " | TOPICAL_CREAM | Freq: Every day | CUTANEOUS | Status: DC | PRN
  Filled 2021-08-31: qty 5

## 2021-08-31 MED ORDER — ALBUTEROL SULFATE (2.5 MG/3ML) 0.083% IN NEBU: 2.5000 mg | INHALATION_SOLUTION | RESPIRATORY_TRACT | Status: DC | PRN

## 2021-08-31 MED ORDER — DULOXETINE HCL 30 MG PO CPEP
30.0000 mg | ORAL_CAPSULE | Freq: Every day | ORAL | Status: DC
  Filled 2021-08-31 (×4): qty 1

## 2021-08-31 MED ORDER — ONDANSETRON HCL 4 MG/2ML IJ SOLN
4.0000 mg | Freq: Once | INTRAMUSCULAR | Status: AC
  Administered 2021-08-31: 4 mg via INTRAVENOUS
  Filled 2021-08-31: qty 2

## 2021-08-31 MED ORDER — ALBUMIN HUMAN 25 % IV SOLN: 12.5000 g | Freq: Once | INTRAVENOUS | Status: DC

## 2021-08-31 MED ORDER — ADULT MULTIVITAMIN W/MINERALS CH
1.0000 | ORAL_TABLET | Freq: Every day | ORAL | Status: DC
  Administered 2021-09-01: 1 via ORAL
  Filled 2021-08-31 (×3): qty 1

## 2021-08-31 MED ORDER — TRAMADOL HCL 50 MG PO TABS
50.0000 mg | ORAL_TABLET | Freq: Four times a day (QID) | ORAL | Status: DC | PRN
  Administered 2021-09-01: 17:00:00 50 mg via ORAL
  Filled 2021-08-31 (×2): qty 1

## 2021-08-31 MED ORDER — SODIUM CHLORIDE 0.9 % IV BOLUS
1000.0000 mL | Freq: Once | INTRAVENOUS | Status: AC
  Administered 2021-08-31: 1000 mL via INTRAVENOUS

## 2021-08-31 MED ORDER — ALBUMIN HUMAN 25 % IV SOLN
25.0000 g | Freq: Once | INTRAVENOUS | Status: AC
  Administered 2021-08-31: 25 g via INTRAVENOUS
  Filled 2021-08-31: qty 100

## 2021-08-31 MED ORDER — TIMOLOL MALEATE 0.5 % OP SOLN
1.0000 [drp] | Freq: Two times a day (BID) | OPHTHALMIC | Status: DC
  Filled 2021-08-31: qty 5

## 2021-08-31 MED ORDER — ONDANSETRON HCL 4 MG/2ML IJ SOLN
4.0000 mg | Freq: Three times a day (TID) | INTRAMUSCULAR | Status: DC | PRN
  Administered 2021-09-01 – 2021-09-03 (×5): 4 mg via INTRAVENOUS
  Filled 2021-08-31 (×6): qty 2

## 2021-08-31 MED ORDER — IOHEXOL 300 MG/ML  SOLN
100.0000 mL | Freq: Once | INTRAMUSCULAR | Status: AC | PRN
  Administered 2021-08-31: 100 mL via INTRAVENOUS

## 2021-08-31 MED ORDER — SODIUM CHLORIDE 0.9 % IV SOLN: INTRAVENOUS | Status: DC

## 2021-08-31 MED ORDER — METRONIDAZOLE 500 MG/100ML IV SOLN
500.0000 mg | Freq: Once | INTRAVENOUS | Status: AC
  Administered 2021-08-31: 500 mg via INTRAVENOUS
  Filled 2021-08-31: qty 100

## 2021-08-31 MED ORDER — IOHEXOL 350 MG/ML SOLN: 75.0000 mL | Freq: Once | INTRAVENOUS | Status: DC | PRN

## 2021-08-31 MED ORDER — ALBUTEROL SULFATE HFA 108 (90 BASE) MCG/ACT IN AERS: 2.0000 | INHALATION_SPRAY | RESPIRATORY_TRACT | Status: DC | PRN

## 2021-08-31 MED ORDER — MORPHINE SULFATE (PF) 2 MG/ML IV SOLN
2.0000 mg | INTRAVENOUS | Status: DC | PRN
  Administered 2021-09-01 – 2021-09-02 (×3): 2 mg via INTRAVENOUS
  Filled 2021-08-31 (×4): qty 1

## 2021-08-31 MED ORDER — METRONIDAZOLE 500 MG/100ML IV SOLN
500.0000 mg | Freq: Two times a day (BID) | INTRAVENOUS | Status: DC
  Administered 2021-08-31 – 2021-09-04 (×8): 500 mg via INTRAVENOUS
  Filled 2021-08-31 (×9): qty 100

## 2021-08-31 MED ORDER — LATANOPROST 0.005 % OP SOLN
1.0000 [drp] | Freq: Every day | OPHTHALMIC | Status: DC
  Administered 2021-08-31 – 2021-09-03 (×4): 1 [drp] via OPHTHALMIC
  Filled 2021-08-31: qty 2.5

## 2021-08-31 MED ORDER — TRIAMCINOLONE ACETONIDE 0.1 % EX CREA
1.0000 "application " | TOPICAL_CREAM | Freq: Every day | CUTANEOUS | Status: DC | PRN
  Filled 2021-08-31: qty 15

## 2021-08-31 MED ORDER — POTASSIUM CHLORIDE CRYS ER 20 MEQ PO TBCR
40.0000 meq | EXTENDED_RELEASE_TABLET | Freq: Once | ORAL | Status: AC
  Administered 2021-08-31: 40 meq via ORAL
  Filled 2021-08-31: qty 2

## 2021-08-31 MED ORDER — SODIUM CHLORIDE 0.9 % IV BOLUS
500.0000 mL | Freq: Once | INTRAVENOUS | Status: AC
  Administered 2021-08-31: 500 mL via INTRAVENOUS

## 2021-08-31 MED ORDER — APIXABAN 5 MG PO TABS
5.0000 mg | ORAL_TABLET | Freq: Two times a day (BID) | ORAL | Status: DC
  Administered 2021-08-31 (×2): 5 mg via ORAL
  Filled 2021-08-31 (×2): qty 1

## 2021-08-31 MED ORDER — CLONAZEPAM 0.5 MG PO TABS
0.5000 mg | ORAL_TABLET | Freq: Every day | ORAL | Status: DC | PRN
  Administered 2021-08-31: 0.5 mg via ORAL
  Filled 2021-08-31: qty 1

## 2021-08-31 MED ORDER — SODIUM CHLORIDE 0.9 % IV SOLN
2.0000 g | Freq: Two times a day (BID) | INTRAVENOUS | Status: DC
Start: 2021-09-01 — End: 2021-09-01
  Administered 2021-09-01 (×2): 2 g via INTRAVENOUS
  Filled 2021-08-31 (×3): qty 2

## 2021-08-31 MED ORDER — MORPHINE SULFATE (PF) 4 MG/ML IV SOLN
4.0000 mg | Freq: Once | INTRAVENOUS | Status: AC
  Administered 2021-08-31: 4 mg via INTRAVENOUS
  Filled 2021-08-31: qty 1

## 2021-08-31 NOTE — ED Notes (Addendum)
Pt transported to US

## 2021-08-31 NOTE — Procedures (Signed)
PROCEDURE SUMMARY:  Successful image-guided paracentesis from the right upper abdomen.  Yielded 300 milliliters of dark, serosanguineous fluid.  No immediate complications.  EBL < 1 mL Patient tolerated well.   Specimen was sent for labs.  Please see imaging section of Epic for full dictation.  Joaquim Nam PA-C 08/31/2021 1:59 PM

## 2021-08-31 NOTE — ED Provider Notes (Signed)
[facility] Emergency Department Provider Note  ____________________________________________   Event Date/Time   First MD Initiated Contact with Patient 08/31/21 1057     (approximate)  I have reviewed the triage vital signs and the nursing notes.   HISTORY  Chief Complaint Abdominal Pain    HPI Brittany Montoya is a 60 y.o. female  with PMHx below here with abd pain, nausea, vomiting. Pt states that for the past 48 hours or so, she has had progressively worsening nausea, vomiting, and diffuse abdominal pain.  She recently had a paracentesis and states that during the procedure, she had some diffuse abdominal pain.  Since then, the pain is progressively worsened.  It is diffuse but primarily in the middle.  Pain is worse with any movement or palpation.  She said associated nausea and vomiting, and has had difficulty tolerating essentially any p.o. for the last 2 days.  Her emesis has been bilious as well as occasionally brown.  She is had some relative constipation.  She went to oncology today and was sent here for further evaluation.  She was given fluids and antiemetics yesterday with slight, but only transient improvement.    Past Medical History:  Diagnosis Date   Anxiety    Arthritis    Ascites    Cirrhosis of liver (HCC)    DDD (degenerative disc disease), cervical    Depression    Family history of breast cancer    GERD (gastroesophageal reflux disease)    Glaucoma    Headache    migraines   Hypertension    Ovarian cancer ([place]) 04/15/2021   PONV (postoperative nausea and vomiting)    Pulmonary embolism (HCC)    Sleep apnea    Venous stasis     Patient Active Problem List   Diagnosis Date Noted   Abdominal pain 08/31/2021   Normocytic anemia 08/31/2021   Thrombocytopenia ([place]) 08/31/2021   Sepsis ([place]) 08/31/2021   Hyponatremia 08/31/2021   Genetic testing 07/10/2021   Symptomatic anemia 07/05/2021   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   Infusion reaction 05/10/2021   Family history of breast cancer 04/27/2021   Goals of care, counseling/discussion 04/19/2021   Pulmonary embolus ([place]) 04/19/2021   Encounter for antineoplastic chemotherapy 04/19/2021   Depression with anxiety 04/19/2021   Ovarian cancer ([place]) 04/15/2021   Malignant ascites 04/11/2021   Other cirrhosis of liver ([place]) 04/11/2021   DDD (degenerative disc disease), cervical 03/02/2019   Glaucoma (increased eye pressure) 03/02/2019   Venous stasis 03/02/2019   Facet arthritis of cervical region 04/01/2016   Incomplete tear of left rotator cuff 01/15/2016   Rotator cuff tendinitis, left 01/15/2016   Essential hypertension 10/02/2015   Obstructive sleep apnea syndrome 10/02/2015   Recurrent major depressive disorder, in full remission ([place]) 10/02/2015   Contusion of left knee 10/17/2014    Past Surgical History:  Procedure Laterality Date   ABDOMINAL HYSTERECTOMY N/A 2008   may have had left ovary removed   [place]     2012, 2015, 2020   COLONOSCOPY N/A 06/29/2021   Procedure: COLONOSCOPY;  Surgeon: Toledo, Benay Pike, MD;  Location: [facility] ENDOSCOPY;  Service: Gastroenterology;  Laterality: N/A;  DOCTORS ARE IN AGREEMENT THAT TOLEDO CAN DO THIS PROCEDURE IN RUSSO'S BLOCK   ESOPHAGOGASTRODUODENOSCOPY N/A 06/29/2021   Procedure: ESOPHAGOGASTRODUODENOSCOPY (EGD);  Surgeon: Toledo, Benay Pike, MD;  Location: [facility] ENDOSCOPY;  Service: Gastroenterology;  Laterality: N/A;   PARACENTESIS  04/20/2021   PILONIDAL CYST / SINUS EXCISION     PORTACATH PLACEMENT Right 06/01/2021   Procedure: INSERTION PORT-A-CATH;  Surgeon: Herbert Pun, MD;  Location: [facility] ORS;  Service: General;  Laterality: Right;    Prior to Admission medications   Medication Sig Start Date End Date Taking? Authorizing Provider  acetaminophen (TYLENOL) 500 MG tablet Take 1,000 mg by mouth at  bedtime.   Yes [provider]  albuterol (VENTOLIN HFA) 108 (90 Base) MCG/ACT inhaler Inhale 1-2 puffs into the lungs every 6 (six) hours as needed for wheezing or shortness of breath. 03/03/21  Yes [provider]  hydrocortisone butyrate (LUCOID) 0.1 % CREA cream Apply 1 application topically daily. 08/03/21  Yes Borders, Kirt Boys, NP  lidocaine-prilocaine (EMLA) cream Apply to affected area once Patient taking differently: Apply 1 application topically daily as needed (pain). Apply to affected area once 04/15/21  Yes Earlie Server, MD  nystatin (MYCOSTATIN) 100000 UNIT/ML suspension Use as directed 5 mLs (500,000 Units total) in the mouth or throat 4 (four) times daily. 08/03/21  Yes Borders, Kirt Boys, NP  triamcinolone cream (KENALOG) 0.1 % Apply 1 application topically daily as needed (irritation). 03/20/21  Yes [provider]  apixaban (ELIQUIS) 5 MG TABS tablet Take 1 tablet (5 mg total) by mouth 2 (two) times daily. 08/24/21   Earlie Server, MD  clonazePAM (KLONOPIN) 0.5 MG tablet Take 1 tablet (0.5 mg total) by mouth daily as needed for anxiety. 08/24/21   Earlie Server, MD  dexamethasone (DECADRON) 4 MG tablet Take 2 tablets (8 mg total) by mouth See admin instructions. Take 8mg  daily for 2 days prior to your chemotherapy Patient not taking: Reported on 08/31/2021 05/10/21   Earlie Server, MD  DULoxetine (CYMBALTA) 30 MG capsule Take 1 capsule (30 mg total) by mouth daily. 08/24/21   Earlie Server, MD  hydrochlorothiazide (HYDRODIURIL) 12.5 MG tablet Take 12.5 mg by mouth daily. 12/03/14   [provider]  latanoprost (XALATAN) 0.005 % ophthalmic solution Place 1 drop into both eyes at bedtime. 01/06/21   [provider]  losartan (COZAAR) 50 MG tablet Take 50 mg by mouth daily. 03/16/21   [provider]  Multiple Vitamin (MULTIVITAMIN WITH MINERALS) TABS tablet Take 1 tablet by mouth daily.    [provider]  ondansetron (ZOFRAN) 8 MG tablet Take 1 tablet (8  mg total) by mouth 2 (two) times daily as needed for refractory nausea / vomiting. Start on day 3 after carboplatin chemo. 04/15/21   Earlie Server, MD  potassium chloride SA (KLOR-CON) 20 MEQ tablet Take 1 tablet (20 mEq total) by mouth 2 (two) times daily. 07/09/21   Earlie Server, MD  prochlorperazine (COMPAZINE) 10 MG tablet Take 1 tablet (10 mg total) by mouth every 6 (six) hours as needed (Nausea or vomiting). 04/15/21   Earlie Server, MD  timolol (TIMOPTIC) 0.5 % ophthalmic solution Place 1 drop into both eyes 2 (two) times daily.    [provider]  traMADol (ULTRAM) 50 MG tablet Take 50 mg by mouth every 6 (six) hours as needed.    [provider]    Allergies Paclitaxel, Amlodipine, and Penicillins  Family History  Problem Relation Age of Onset   Breast cancer Mother 15   Breast cancer Maternal Aunt        d. under 77    Social History Social History   Tobacco Use   Smoking status: Never   Smokeless tobacco:  Never  Vaping Use   Vaping Use: Never used  Substance Use Topics   Alcohol use: No   Drug use: No    Review of Systems  Review of Systems  Constitutional:  Positive for fatigue. Negative for fever.  HENT:  Negative for congestion and sore throat.   Eyes:  Negative for visual disturbance.  Respiratory:  Negative for cough and shortness of breath.   Cardiovascular:  Negative for chest pain.  Gastrointestinal:  Positive for abdominal pain, nausea and vomiting. Negative for diarrhea.  Genitourinary:  Negative for flank pain.  Musculoskeletal:  Negative for back pain and neck pain.  Skin:  Negative for rash and wound.  Neurological:  Positive for weakness.  All other systems reviewed and are negative.   ____________________________________________  PHYSICAL EXAM:      VITAL SIGNS: ED Triage Vitals  Enc Vitals Group     BP 08/31/21 1032 134/89     Pulse Rate 08/31/21 1032 (!) 122     Resp 08/31/21 1032 20     Temp 08/31/21 1032 98.4 F (36.9 C)      Temp Source 08/31/21 1032 Oral     SpO2 08/31/21 1032 98 %     Weight --      Height 08/31/21 1030 5\' 1"  (1.549 m)     Head Circumference --      Peak Flow --      Pain Score 08/31/21 1029 5     Pain Loc --      Pain Edu? --      Excl. in [place]? --      Physical Exam Vitals and nursing note reviewed.  Constitutional:      General: She is not in acute distress.    Appearance: She is well-developed.  HENT:     Head: Normocephalic and atraumatic.     Mouth/Throat:     Comments: Dry MM Eyes:     Conjunctiva/sclera: Conjunctivae normal.  Cardiovascular:     Rate and Rhythm: Regular rhythm. Tachycardia present.     Heart sounds: Normal heart sounds. No murmur heard.   No friction rub.  Pulmonary:     Effort: Pulmonary effort is normal. No respiratory distress.     Breath sounds: Normal breath sounds. No wheezing or rales.  Abdominal:     General: There is no distension.     Palpations: Abdomen is soft.     Tenderness: There is abdominal tenderness. There is rebound. There is no guarding. Negative signs include Murphy's sign and Rovsing's sign.  Musculoskeletal:     Cervical back: Neck supple.  Skin:    General: Skin is warm.     Capillary Refill: Capillary refill takes less than 2 seconds.  Neurological:     Mental Status: She is alert and oriented to person, place, and time.     Motor: No abnormal muscle tone.      ____________________________________________   LABS (all labs ordered are listed, but only abnormal results are displayed)  Labs Reviewed  URINALYSIS, ROUTINE W REFLEX MICROSCOPIC - Abnormal; Notable for the following components:      Result Value   Color, Urine YELLOW (*)    APPearance CLEAR (*)    Specific Gravity, Urine >1.046 (*)    Ketones, ur 5 (*)    All other components within normal limits  LACTIC ACID, PLASMA - Abnormal; Notable for the following components:   Lactic Acid, Venous 3.1 (*)    All other components within normal  limits  BODY  FLUID CELL COUNT WITH DIFFERENTIAL - Abnormal; Notable for the following components:   Color, Fluid RED (*)    Appearance, Fluid CLOUDY (*)    All other components within normal limits  LACTATE DEHYDROGENASE, PLEURAL OR PERITONEAL FLUID - Abnormal; Notable for the following components:   LD, Fluid 1,174 (*)    All other components within normal limits  RESP PANEL BY RT-PCR (FLU A&B, COVID) ARPGX2  CULTURE, BLOOD (ROUTINE X 2)  CULTURE, BLOOD (ROUTINE X 2)  BODY FLUID CULTURE W GRAM STAIN  LIPASE, BLOOD  GLUCOSE, PLEURAL OR PERITONEAL FLUID  PROTEIN, PLEURAL OR PERITONEAL FLUID  OSMOLALITY  OSMOLALITY, URINE  SODIUM, URINE, RANDOM  BASIC METABOLIC PANEL  BASIC METABOLIC PANEL  PROCALCITONIN  HIV ANTIBODY (ROUTINE TESTING W REFLEX)  TOTAL BILIRUBIN, BODY FLUID  CBC  PROTIME-INR  APTT  CYTOLOGY - NON PAP    ____________________________________________  EKG: Sinus tachycardia, ventricular 122.  PR 129, QRS 69, QTc 422.  No acute ST elevations or depressions.  No EKG evidence of acute ischemia or infarct. ________________________________________  RADIOLOGY All imaging, including plain films, CT scans, and ultrasounds, independently reviewed by me, and interpretations confirmed via formal radiology reads.  ED MD interpretation:   CT abdomen/pelvis: Small to moderate volume ascites, no acute surgical abnormality  Official radiology report(s): CT ABDOMEN PELVIS W CONTRAST  Result Date: 08/31/2021 CLINICAL DATA:  Abdominal pain, history of ovarian cancer EXAM: CT ABDOMEN AND PELVIS WITH CONTRAST TECHNIQUE: Multidetector CT imaging of the abdomen and pelvis was performed using the standard protocol following bolus administration of intravenous contrast. CONTRAST:  151mL OMNIPAQUE IOHEXOL 300 MG/ML  SOLN COMPARISON:  CT abdomen and pelvis dated June 25, 2021 FINDINGS: Lower chest: No acute abnormality. Hepatobiliary: No focal liver abnormality is seen. Status post cholecystectomy.  No biliary dilatation. In Pancreas: Unremarkable. No pancreatic ductal dilatation or surrounding inflammatory changes. Spleen: Normal in size without focal abnormality. Adrenals/Urinary Tract: Adrenal glands are unremarkable. Kidneys are normal, without renal calculi, focal lesion, or hydronephrosis. Bladder is unremarkable. Stomach/Bowel: Stomach is within normal limits. Appendix appears normal. No evidence of bowel wall thickening, distention, or inflammatory changes. Vascular/Lymphatic: Aortic atherosclerosis. No enlarged abdominal or pelvic lymph nodes. Reproductive: Uterus is surgically absent. Unchanged appearance of large cystic and solid mass of the right ovary, measures approximately 1.9 x 1.3 cm, when measured on coronal image, unchanged in size when remeasured in similar plane on prior exam. Other: Small fluid containing umbilical hernia. Similar peritoneal thickening and nodularity. Small to moderate volume abdominal ascites, slightly decreased when compared to the prior exam. Musculoskeletal: No acute or significant osseous findings. IMPRESSION: 1. No acute findings in the abdomen or pelvis. 2. Unchanged appearance of large cystic and solid mass of the right ovary. 3. Similar peritoneal thickening and nodularity. 4. Small to moderate volume abdominal ascites, slightly decreased when compared with prior exam. 5.  Aortic Atherosclerosis (ICD10-I70.0). Electronically Signed   By: Yetta Glassman M.D.   On: 08/31/2021 12:24   US Paracentesis  Result Date: 08/31/2021 INDICATION: Patient with history of ovarian cancer, recurrent ascites, new onset abdominal pain following paracentesis 08/29/2021. Request to IR for diagnostic and therapeutic paracentesis EXAM: ULTRASOUND GUIDED DIAGNOSTIC AND THERAPEUTIC PARACENTESIS MEDICATIONS: 8 mL 1% lidocaine COMPLICATIONS: None immediate. PROCEDURE: Informed written consent was obtained from the patient after a discussion of the risks, benefits and alternatives to  treatment. A timeout was performed prior to the initiation of the procedure. Initial ultrasound scanning demonstrates a small amount of  ascites within the right upper abdominal quadrant. The right upper abdomen was prepped and draped in the usual sterile fashion. 1% lidocaine was used for local anesthesia. Following this, a 19 gauge, 7-cm, Yueh catheter was introduced. An ultrasound image was saved for documentation purposes. The paracentesis was performed. The catheter was removed and a dressing was applied. The patient tolerated the procedure well without immediate post procedural complication. FINDINGS: A total of approximately 300 mL of clear, dark serosanguineous fluid was removed. Samples were sent to the laboratory as requested by the clinical team. IMPRESSION: Successful ultrasound-guided paracentesis yielding 300 milliliters of peritoneal fluid. Read by Candiss Norse, PA-C Electronically Signed   By: Miachel Roux M.D.   On: 08/31/2021 14:48    ____________________________________________  PROCEDURES   Procedure(s) performed (including Critical Care):  .Critical Care Performed by: Duffy Bruce, MD Authorized by: Duffy Bruce, MD   Critical care provider statement:    Critical care time (minutes):  30   Critical care was necessary to treat or prevent imminent or life-threatening deterioration of the following conditions:  Cardiac failure, circulatory failure, respiratory failure and sepsis   Critical care was time spent personally by me on the following activities:  Development of treatment plan with patient or surrogate, discussions with consultants, evaluation of patient's response to treatment, examination of patient, ordering and review of laboratory studies, ordering and review of radiographic studies, ordering and performing treatments and interventions, pulse oximetry, re-evaluation of patient's condition and review of old charts   I assumed direction of critical care for  this patient from another provider in my specialty: no     Care discussed with: admitting provider    ____________________________________________  INITIAL IMPRESSION / MDM / [place] / ED COURSE  As part of my medical decision making, I reviewed the following data within the [place] notes reviewed and incorporated, Old chart reviewed, Notes from prior ED visits, and [place] Controlled Substance Database       *Treonna Klee was evaluated in Emergency Department on 08/31/2021 for the symptoms described in the history of present illness. She was evaluated in the context of the global COVID-19 pandemic, which necessitated consideration that the patient might be at risk for infection with the SARS-CoV-2 virus that causes COVID-19. Institutional protocols and algorithms that pertain to the evaluation of patients at risk for COVID-19 are in a state of rapid change based on information released by regulatory bodies including the CDC and federal and state organizations. These policies and algorithms were followed during the patient's care in the ED.  Some ED evaluations and interventions may be delayed as a result of limited staffing during the pandemic.*     Medical Decision Making:  60 yo F here with abdominal pain, nausea, in setting of recent paracentesis. DDx includes bacterial peritonitis, also chemo-related N/V or obstruction related to her ovarian CA. CT scan subsequently obtained, reviewed, shows no signs of acute intra-abd emergency. No obstruction noted. Labs from oncology visit and note from visit reviewed, show increasing leukocytosis, relative dehydration, with concern for peritonitis. Will consult IR for drainage and fluid analysis, start on empiric abx, and give fluids. Admit to medicine. Pt updated and in agreement.   ____________________________________________  FINAL CLINICAL IMPRESSION(S) / ED DIAGNOSES  Final diagnoses:  Abdominal pain   Sepsis, due to unspecified organism, unspecified whether acute organ dysfunction present ([place])     MEDICATIONS GIVEN DURING THIS VISIT:  Medications  morphine 2 MG/ML injection 2 mg (  has no administration in time range)  ondansetron (ZOFRAN) injection 4 mg (has no administration in time range)  hydrALAZINE (APRESOLINE) injection 5 mg (has no administration in time range)  acetaminophen (TYLENOL) tablet 650 mg (has no administration in time range)  metroNIDAZOLE (FLAGYL) IVPB 500 mg (has no administration in time range)  0.9 %  sodium chloride infusion (has no administration in time range)  traMADol (ULTRAM) tablet 50 mg (has no administration in time range)  DULoxetine (CYMBALTA) DR capsule 30 mg (0 mg Oral Hold 08/31/21 1514)  apixaban (ELIQUIS) tablet 5 mg (5 mg Oral Given 08/31/21 1521)  clonazePAM (KLONOPIN) tablet 0.5 mg (has no administration in time range)  multivitamin with minerals tablet 1 tablet (has no administration in time range)  latanoprost (XALATAN) 0.005 % ophthalmic solution 1 drop (has no administration in time range)  lidocaine-prilocaine (EMLA) cream 1 application (has no administration in time range)  timolol (TIMOPTIC) 0.5 % ophthalmic solution 1 drop (0 drops Both Eyes Hold 08/31/21 1521)  triamcinolone cream (KENALOG) 0.1 % cream 1 application (has no administration in time range)  albuterol (PROVENTIL) (2.5 MG/3ML) 0.083% nebulizer solution 2.5 mg (has no administration in time range)  ceFEPIme (MAXIPIME) 2 g in sodium chloride 0.9 % 100 mL IVPB (has no administration in time range)  sodium chloride 0.9 % bolus 1,000 mL (0 mLs Intravenous Stopped 08/31/21 1401)  metroNIDAZOLE (FLAGYL) IVPB 500 mg (0 mg Intravenous Stopped 08/31/21 1312)  ceFEPIme (MAXIPIME) 2 g in sodium chloride 0.9 % 100 mL IVPB (0 g Intravenous Stopped 08/31/21 1401)  iohexol (OMNIPAQUE) 300 MG/ML solution 100 mL (100 mLs Intravenous Contrast Given 08/31/21 1203)  ondansetron (ZOFRAN)  injection 4 mg (4 mg Intravenous Given 08/31/21 1233)  morphine 4 MG/ML injection 4 mg (4 mg Intravenous Given 08/31/21 1233)  sodium chloride 0.9 % bolus 1,000 mL (0 mLs Intravenous Stopped 08/31/21 1637)  sodium chloride 0.9 % bolus 500 mL (500 mLs Intravenous New Bag/Given 08/31/21 1703)  albumin human 25 % solution 25 g (0 g Intravenous Stopped 08/31/21 1704)     ED Discharge Orders     None        Note:  This document was prepared using Dragon voice recognition software and may include unintentional dictation errors.   Duffy Bruce, MD 08/31/21 1730

## 2021-08-31 NOTE — Progress Notes (Signed)
PHARMACY -  BRIEF ANTIBIOTIC NOTE   Pharmacy has received consult(s) for cefepime from an ED provider.  The patient's profile has been reviewed for ht/wt/allergies/indication/available labs. Pt has noted allergy to penicillins (rash) but has tolerated cephalosporins in the past.   One time order(s) placed for Cefepime 2 g  Further antibiotics/pharmacy consults should be ordered by admitting physician if indicated.                       Thank you, Brittany Montoya 08/31/2021  11:21 AM

## 2021-08-31 NOTE — ED Notes (Signed)
Date and time results received: 08/31/21 1230 (use smartphrase ".now" to insert current time)  Test: Lactic Acid Critical Value: 3.1  Name of Provider Notified: Dr. Ellender Hose  Orders Received? Or Actions Taken?: see chart

## 2021-08-31 NOTE — Progress Notes (Signed)
Pharmacy Antibiotic Note  Brittany Montoya is a 60 y.o. female admitted on 08/31/2021. Pharmacy has been consulted for cefepime dosing.  Plan: Cefepime 2 g IV q12h Monitor renal function and adjust dose as clinically indicated  Height: 5\' 1"  (154.9 cm) IBW/kg (Calculated) : 47.8  Temp (24hrs), Avg:98.7 F (37.1 C), Min:97.6 F (36.4 C), Max:100.1 F (37.8 C)  Recent Labs  Lab 08/30/21 1500 08/31/21 0904 08/31/21 1136  WBC 17.8* 21.6*  --   CREATININE 0.86 1.26*  --   LATICACIDVEN  --   --  3.1*    Estimated Creatinine Clearance: 46.6 mL/min (A) (by C-G formula based on SCr of 1.26 mg/dL (H)).    Allergies  Allergen Reactions   Paclitaxel Shortness Of Breath and Other (See Comments)    pt flush, stated she didn't feel well, taxol stopped (05/10/2021)   Amlodipine Other (See Comments) and Rash    Not effective Not effective    Penicillins Rash    Antimicrobials this admission: 12/16 metronidazole >>  12/16 cefepime >>   Microbiology results: 12/16 BCx: sent  Thank you for allowing pharmacy to be a part of this patients care.  Forde Dandy Drishti Pepperman 08/31/2021 1:57 PM

## 2021-08-31 NOTE — Progress Notes (Addendum)
Hematology/Oncology progress note    Patient Care Team: Adin Hector, MD as PCP - General (Internal Medicine) Earlie Server, MD as Consulting Physician (Hematology and Oncology) Clent Jacks, RN as Oncology Nurse Navigator  REFERRING PROVIDER: Adin Hector, MD  CHIEF COMPLAINTS/REASON FOR VISIT:  Malignant ascites, ovarian neoplasm, pulmonary embolism  HISTORY OF PRESENTING ILLNESS:   Brittany Montoya is a  60 y.o.  female with PMH listed below was seen in consultation at the request of  Adin Hector, MD  for evaluation of complicated ovarian cyst, ascites  Patient has noticed generalized abdominal distention and bloating, nausea and constipation for the past months and  03/23/2021, CT abdomen pelvis with contrast showed large solid and cystic right ovarian mass measuring 14.1 x 18.1 x 16 cm highly suspicious for primary ovarian malignancy.  Evidence of peritoneal spread.  Moderate associated ascites. Slightly small liver with mild nodular contour suggesting a degree of cirrhosis.  04/03/2021, CA125 70.9, Hg 4 400 09.  04/04/2021, was seen by gynecology Dr. Ouida Sills.  And was referred to establish care with GYN oncology. 04/09/2021, patient underwent paracentesis and had 6.3 L of hazy yellow fluid removed.  Cytology is pending.  04/12/2021 patient was referred to see Dr. Theora Gianotti and me.  Patient also has noticed bilateral lower extremity edema, progressively worsening.  Fatigued, shortness of breath with exertion.  Denies any chest pain, cough.  Poor oral intake due to decreased appetite She was accompanied by her husband. Denies any alcohol use or previous hepatitis infection.  She is not aware about cirrhosis. She reports some symptom relief after the paracentesis.  Her abdomen seems to got better and getting worse again.  04/19/21- carbo AUC 5- taxol 135 mg/m2 05/10/21- carbo AUC 6 - taxol 150 mg/m2; reaction to taxol- discontinued 06/07/21- carbo AUC 6 - abraxane  260 mg/m2  # CA125/CEA ratio is <25,  06/29/2021 colonoscopy showed non bleeding internal hemorrhoids. Diverticulosis,  Distal transverse colon 4 mm polyp, resected and retrieved.-Pathology showed polypoid colonic mucosa with intramucosal lymphoid aggregate.  Negative for malignancy and dysplasia. Upper endoscopy showed esophageal mucosal changes suspicious for eosinophilic esophagitis.  Biopsied-pathology showed reflux esophagitis.  Negative for increased eosinophils..  Benign-appearing esophageal stenosis.  Hiatal hernia. 06/01/2021, patient has US abdomen paracentesis and had 4 L of fluid removed.  #06/25/2021 1. Redemonstrated large, mixed solid and cystic mass of the right ovary, which is slightly diminished in size. 2. Peritoneal and omental thickening and nodularity is improved compared to prior examination, although still present. 3. Findings are consistent with treatment response of primary ovarian malignancy and peritoneal and omental metastatic disease. 4. Moderate volume ascites throughout the abdomen and pelvis, similar in volume to prior. 5. No evidence of metastatic disease in the chest.  06/26/2021 therapeutic paracentesis.    06/27/2021 seen by Dr.Secord. patient is deconditioned. Dr.Secord recommend to continue neoadjuvant chemotherapy and optimize her health status with physical therapy and dietitian.   07/23/2021, ultrasound guided paracentesis, 4.1 L removed.    INTERVAL HISTORY Brittany Montoya is a 60 y.o. female who has above history reviewed by me today presents for follow up visit for nausea vomiting, abdominal pain, history of malignant ascites, ovarian neoplasm, pulmonary embolism Patient was accompanied by husband today. Patient was seen by symptom management clinic yesterday for nausea vomiting and abdominal pain started 1 day after therapeutic paracentesis.  No fever or chills.  She received IV fluid, antiemetics and dexamethasone yesterday was slight  improvement.  She  went home and had a supple and not able to keep anything down.  Today she was able to eat small amount of breakfast in the morning.  Still feeling nauseated.  Abdominal pain, not much better, 5 out of 10.    Review of Systems  Constitutional:  Positive for appetite change and fatigue. Negative for chills and fever.  HENT:   Negative for hearing loss and voice change.   Eyes:  Negative for eye problems.  Respiratory:  Positive for shortness of breath. Negative for chest tightness and cough.   Cardiovascular:  Negative for chest pain.  Gastrointestinal:  Positive for abdominal distention, abdominal pain, constipation, nausea and vomiting. Negative for blood in stool.  Endocrine: Negative for hot flashes.  Genitourinary:  Negative for difficulty urinating and frequency.   Musculoskeletal:  Negative for arthralgias.  Skin:  Negative for itching and rash.  Neurological:  Negative for extremity weakness.  Hematological:  Negative for adenopathy.  Psychiatric/Behavioral:  Negative for confusion. The patient is not nervous/anxious.    MEDICAL HISTORY:  Past Medical History:  Diagnosis Date   Anxiety    Arthritis    Ascites    Cirrhosis of liver ([place])    DDD (degenerative disc disease), cervical    Depression    Family history of breast cancer    GERD (gastroesophageal reflux disease)    Glaucoma    Headache    migraines   Hypertension    Ovarian cancer ([place]) 04/15/2021   PONV (postoperative nausea and vomiting)    Pulmonary embolism ([place])    Sleep apnea    Venous stasis     SURGICAL HISTORY: Past Surgical History:  Procedure Laterality Date   ABDOMINAL HYSTERECTOMY N/A 2008   may have had left ovary removed   [place]     2012, 2015, 2020   COLONOSCOPY N/A 06/29/2021   Procedure: COLONOSCOPY;  Surgeon: Toledo, Benay Pike, MD;  Location: [facility] ENDOSCOPY;  Service: Gastroenterology;  Laterality: N/A;   DOCTORS ARE IN AGREEMENT THAT TOLEDO CAN DO THIS PROCEDURE IN RUSSO'S BLOCK   ESOPHAGOGASTRODUODENOSCOPY N/A 06/29/2021   Procedure: ESOPHAGOGASTRODUODENOSCOPY (EGD);  Surgeon: Toledo, Benay Pike, MD;  Location: [facility] ENDOSCOPY;  Service: Gastroenterology;  Laterality: N/A;   PARACENTESIS  04/20/2021   PILONIDAL CYST / SINUS EXCISION     PORTACATH PLACEMENT Right 06/01/2021   Procedure: INSERTION PORT-A-CATH;  Surgeon: Herbert Pun, MD;  Location: [facility] ORS;  Service: General;  Laterality: Right;    SOCIAL HISTORY: Social History   Socioeconomic History   Marital status: Married    Spouse name: Barnabas Lister   Number of children: 2   Years of education: Not on file   Highest education level: Not on file  Occupational History   Not on file  Tobacco Use   Smoking status: Never   Smokeless tobacco: Never  Vaping Use   Vaping Use: Never used  Substance and Sexual Activity   Alcohol use: No   Drug use: No   Sexual activity: Not Currently    Birth control/protection: Surgical  Other Topics Concern   Not on file  Social History Narrative   Not on file   Social Determinants of Health   Financial Resource Strain: Not on file  Food Insecurity: Not on file  Transportation Needs: Not on file  Physical Activity: Not on file  Stress: Not on file  Social Connections: Not on file  Intimate Partner Violence:  Not on file    FAMILY HISTORY: Family History  Problem Relation Age of Onset   Breast cancer Mother 64   Breast cancer Maternal Aunt        d. under 70    ALLERGIES:  is allergic to paclitaxel, amlodipine, and penicillins.  MEDICATIONS:  No current facility-administered medications for this visit.   No current outpatient medications on file.   Facility-Administered Medications Ordered in Other Visits  Medication Dose Route Frequency Provider Last Rate Last Admin   0.9 %  sodium chloride infusion   Intravenous Continuous Ivor Costa, MD 75 mL/hr at 08/31/21 1917 New Bag  at 08/31/21 1917   acetaminophen (TYLENOL) tablet 650 mg  650 mg Oral Q6H PRN Ivor Costa, MD       albuterol (PROVENTIL) (2.5 MG/3ML) 0.083% nebulizer solution 2.5 mg  2.5 mg Nebulization Q4H PRN Ivor Costa, MD       apixaban Arne Cleveland) tablet 5 mg  5 mg Oral BID Ivor Costa, MD   5 mg at 08/31/21 1521   [START ON 09/01/2021] ceFEPIme (MAXIPIME) 2 g in sodium chloride 0.9 % 100 mL IVPB  2 g Intravenous Q12H Rauer, Samantha O, RPH       clonazePAM (KLONOPIN) tablet 0.5 mg  0.5 mg Oral Daily PRN Ivor Costa, MD       DULoxetine (CYMBALTA) DR capsule 30 mg  30 mg Oral Daily Ivor Costa, MD       hydrALAZINE (APRESOLINE) injection 5 mg  5 mg Intravenous Q2H PRN Ivor Costa, MD       latanoprost (XALATAN) 0.005 % ophthalmic solution 1 drop  1 drop Both Eyes QHS Ivor Costa, MD       lidocaine-prilocaine (EMLA) cream 1 application  1 application Topical Daily PRN Ivor Costa, MD       LORazepam (ATIVAN) injection 0.5 mg  0.5 mg Intravenous Once PRN Earlie Server, MD       metroNIDAZOLE (FLAGYL) IVPB 500 mg  500 mg Intravenous Q12H Ivor Costa, MD       morphine 2 MG/ML injection 2 mg  2 mg Intravenous Q4H PRN Ivor Costa, MD       Derrill Memo ON 09/01/2021] multivitamin with minerals tablet 1 tablet  1 tablet Oral Daily Ivor Costa, MD       ondansetron [facility]) injection 4 mg  4 mg Intravenous Q8H PRN Ivor Costa, MD       timolol (TIMOPTIC) 0.5 % ophthalmic solution 1 drop  1 drop Both Eyes BID Ivor Costa, MD       traMADol Veatrice Bourbon) tablet 50 mg  50 mg Oral Q6H PRN Ivor Costa, MD       triamcinolone cream (KENALOG) 0.1 % cream 1 application  1 application Topical Daily PRN Ivor Costa, MD         PHYSICAL EXAMINATION: ECOG PERFORMANCE STATUS: 2 - Symptomatic, <50% confined to bed Vitals:   08/31/21 0919  BP: (!) 168/86  Pulse: (!) 119  Temp: 97.6 F (36.4 C)  SpO2: 97%    There were no vitals filed for this visit.   Physical Exam Constitutional:      General: She is not in acute distress.    Appearance:  She is obese.  HENT:     Head: Normocephalic and atraumatic.  Eyes:     General: No scleral icterus. Cardiovascular:     Rate and Rhythm: Regular rhythm. Tachycardia present.     Heart sounds: Normal heart sounds.  Pulmonary:  Effort: Pulmonary effort is normal. No respiratory distress.     Breath sounds: Normal breath sounds. No wheezing.  Abdominal:     General: Bowel sounds are normal. There is distension.     Palpations: Abdomen is soft.  Musculoskeletal:        General: No deformity. Normal range of motion.     Cervical back: Normal range of motion and neck supple.  Skin:    General: Skin is warm and dry.     Findings: No erythema or rash.  Neurological:     Mental Status: She is alert and oriented to person, place, and time. Mental status is at baseline.     Cranial Nerves: No cranial nerve deficit.     Coordination: Coordination normal.  Psychiatric:        Mood and Affect: Mood normal.    LABORATORY DATA:  I have reviewed the data as listed Lab Results  Component Value Date   WBC 21.6 (H) 08/31/2021   HGB 9.9 (L) 08/31/2021   HCT 28.9 (L) 08/31/2021   MCV 99.7 08/31/2021   PLT 31 (L) 08/31/2021   Recent Labs    08/17/21 0828 08/30/21 1500 08/31/21 0904 08/31/21 1705  NA 132* 129* 128* 130*  K 3.6 3.9 3.6 3.4*  CL 99 90* 91* 97*  CO2 22 25 24 25   GLUCOSE 132* 108* 169* 105*  BUN 16 11 19 17   CREATININE 1.02* 0.86 1.26* 1.03*  CALCIUM 9.1 9.2 9.0 8.3*  GFRNONAA >60 >60 49* >60  PROT 6.0* 6.4* 6.4*  --   ALBUMIN 2.6* 2.6* 2.6*  --   AST 35 36 37  --   ALT 19 25 25   --   ALKPHOS 170* 217* 214*  --   BILITOT 0.2* 0.6 0.6  --     Iron/TIBC/Ferritin/ %Sat    Component Value Date/Time   IRON 50 08/17/2021 0828   TIBC 186 (L) 08/17/2021 0828   FERRITIN 636 (H) 08/17/2021 0828   IRONPCTSAT 27 08/17/2021 0828      RADIOGRAPHIC STUDIES: I have personally reviewed the radiological images as listed and agreed with the findings in the report. CT  ABDOMEN PELVIS W CONTRAST  Result Date: 08/31/2021 CLINICAL DATA:  Abdominal pain, history of ovarian cancer EXAM: CT ABDOMEN AND PELVIS WITH CONTRAST TECHNIQUE: Multidetector CT imaging of the abdomen and pelvis was performed using the standard protocol following bolus administration of intravenous contrast. CONTRAST:  160mL OMNIPAQUE IOHEXOL 300 MG/ML  SOLN COMPARISON:  CT abdomen and pelvis dated June 25, 2021 FINDINGS: Lower chest: No acute abnormality. Hepatobiliary: No focal liver abnormality is seen. Status post cholecystectomy. No biliary dilatation. In Pancreas: Unremarkable. No pancreatic ductal dilatation or surrounding inflammatory changes. Spleen: Normal in size without focal abnormality. Adrenals/Urinary Tract: Adrenal glands are unremarkable. Kidneys are normal, without renal calculi, focal lesion, or hydronephrosis. Bladder is unremarkable. Stomach/Bowel: Stomach is within normal limits. Appendix appears normal. No evidence of bowel wall thickening, distention, or inflammatory changes. Vascular/Lymphatic: Aortic atherosclerosis. No enlarged abdominal or pelvic lymph nodes. Reproductive: Uterus is surgically absent. Unchanged appearance of large cystic and solid mass of the right ovary, measures approximately 1.9 x 1.3 cm, when measured on coronal image, unchanged in size when remeasured in similar plane on prior exam. Other: Small fluid containing umbilical hernia. Similar peritoneal thickening and nodularity. Small to moderate volume abdominal ascites, slightly decreased when compared to the prior exam. Musculoskeletal: No acute or significant osseous findings. IMPRESSION: 1. No acute findings in the abdomen  or pelvis. 2. Unchanged appearance of large cystic and solid mass of the right ovary. 3. Similar peritoneal thickening and nodularity. 4. Small to moderate volume abdominal ascites, slightly decreased when compared with prior exam. 5.  Aortic Atherosclerosis (ICD10-I70.0). Electronically  Signed   By: Yetta Glassman M.D.   On: 08/31/2021 12:24   US Paracentesis  Result Date: 08/31/2021 INDICATION: Patient with history of ovarian cancer, recurrent ascites, new onset abdominal pain following paracentesis 08/29/2021. Request to IR for diagnostic and therapeutic paracentesis EXAM: ULTRASOUND GUIDED DIAGNOSTIC AND THERAPEUTIC PARACENTESIS MEDICATIONS: 8 mL 1% lidocaine COMPLICATIONS: None immediate. PROCEDURE: Informed written consent was obtained from the patient after a discussion of the risks, benefits and alternatives to treatment. A timeout was performed prior to the initiation of the procedure. Initial ultrasound scanning demonstrates a small amount of ascites within the right upper abdominal quadrant. The right upper abdomen was prepped and draped in the usual sterile fashion. 1% lidocaine was used for local anesthesia. Following this, a 19 gauge, 7-cm, Yueh catheter was introduced. An ultrasound image was saved for documentation purposes. The paracentesis was performed. The catheter was removed and a dressing was applied. The patient tolerated the procedure well without immediate post procedural complication. FINDINGS: A total of approximately 300 mL of clear, dark serosanguineous fluid was removed. Samples were sent to the laboratory as requested by the clinical team. IMPRESSION: Successful ultrasound-guided paracentesis yielding 300 milliliters of peritoneal fluid. Read by Candiss Norse, PA-C Electronically Signed   By: Miachel Roux M.D.   On: 08/31/2021 14:48   US Paracentesis  Result Date: 08/29/2021 INDICATION: Patient with history of ovarian cancer recurrent ascites request for paracentesis. EXAM: ULTRASOUND GUIDED  PARACENTESIS MEDICATIONS: Local 1% lidocaine only. COMPLICATIONS: None immediate. PROCEDURE: Informed written consent was obtained from the patient after a discussion of the risks, benefits and alternatives to treatment. A timeout was performed prior to the  initiation of the procedure. Initial ultrasound scanning demonstrates a large amount of ascites within the left lower abdominal quadrant. The left lower abdomen was prepped and draped in the usual sterile fashion. 1% lidocaine was used for local anesthesia. Following this, a 19 gauge, 7-cm, Yueh catheter was introduced. An ultrasound image was saved for documentation purposes. The paracentesis was performed. The catheter was removed and a dressing was applied. The patient tolerated the procedure well without immediate post procedural complication. FINDINGS: A total of approximately 4 L of amber colored fluid was removed. IMPRESSION: Successful ultrasound-guided paracentesis yielding 4 liters of peritoneal fluid. This exam was performed by Tsosie Billing PA-C, and was supervised and interpreted by Dr. Vernard Gambles. Electronically Signed   By: Lucrezia Europe M.D.   On: 08/29/2021 16:23   US Paracentesis  Result Date: 08/22/2021 INDICATION: Patient with history of ovarian cancer and recurrent ascites request for paracentesis. EXAM: ULTRASOUND GUIDED  PARACENTESIS MEDICATIONS: Local 1% lidocaine only. COMPLICATIONS: None immediate. PROCEDURE: Informed written consent was obtained from the patient after a discussion of the risks, benefits and alternatives to treatment. A timeout was performed prior to the initiation of the procedure. Initial ultrasound scanning demonstrates a large amount of ascites within the right lower abdominal quadrant. The right lower abdomen was prepped and draped in the usual sterile fashion. 1% lidocaine was used for local anesthesia. Following this, a 19 gauge, 7-cm, Yueh catheter was introduced. An ultrasound image was saved for documentation purposes. The paracentesis was performed. The catheter was removed and a dressing was applied. The patient tolerated the procedure well without immediate post  procedural complication. FINDINGS: A total of approximately 4 liters of amber colored fluid was  removed. IMPRESSION: Successful ultrasound-guided paracentesis yielding 4 liters of peritoneal fluid. This exam was performed by Tsosie Billing PA-C, and was supervised and interpreted by Dr. Pascal Lux. Electronically Signed   By: Sandi Mariscal M.D.   On: 08/22/2021 16:38   US Paracentesis  Result Date: 08/16/2021 INDICATION: 60 year old female with history of ovarian cancer with recurrent ascites presenting for paracentesis. EXAM: ULTRASOUND GUIDED  PARACENTESIS MEDICATIONS: None. COMPLICATIONS: None immediate. PROCEDURE: Informed written consent was obtained from the patient after a discussion of the risks, benefits and alternatives to treatment. A timeout was performed prior to the initiation of the procedure. Initial ultrasound scanning demonstrates a large amount of ascites within the right lower abdominal quadrant. The right lower abdomen was prepped and draped in the usual sterile fashion. 1% lidocaine was used for local anesthesia. Following this, a 6 Fr Safe-T-Centesis catheter was introduced. An ultrasound image was saved for documentation purposes. The paracentesis was performed. The catheter was removed and a dressing was applied. The patient tolerated the procedure well without immediate post procedural complication. FINDINGS: A total of approximately 4 of translucent, amber fluid was removed. IMPRESSION: Successful ultrasound-guided paracentesis yielding 4 liters of peritoneal fluid. Ruthann Cancer, MD [facility] Electronically Signed   By: Ruthann Cancer M.D.   On: 08/16/2021 16:25      ASSESSMENT & PLAN:  1. Thrombocytopenia ([place])   2. Malignant neoplasm of right ovary (HCC)   3. Encounter for antineoplastic chemotherapy   4. Other acute pulmonary embolism without acute cor pulmonale ([facility])    #Large ovarian cystic mass with malignant ascites, peritoneal nodularity Case was discussed on Gynonc tumor board on 04/25/2021. Consensus reached  upon clinical diagnosis of locally advanced Stage II/III Ovarian Cancer, neoadjuvant chemotherapy followed by debulking surgery.  Status post 6 cycles of neoadjuvant chemo with G-CSF support. Last chemo therapy on 08/17/2021.  #Intractable nausea vomiting diarrhea Despite IV fluid, IV antiemetics and dexamethasone. Patient remains tachycardic with a heart rate of 119.  #Abdominal pain I recommend patient to have a stat CT image for further evaluation. Differential diagnosis includes spontaneous peritonitis, cancer progression, hematoma, obstruction [less likely given that she has had regular bowel movement]. She will need diagnostic paracentesis with cell count and culture.  #Thrombocytopenia, immature platelet count is high. ?  Chemotherapy induced versus peripheral consumption.  Rule out bleeding. Recommend patient to hold Eliquis.  She takes Eliquis for pulmonary embolism bilaterally.   Recommend patient to go to emergency room for further evaluation.  She agrees with the plan.  I called ER triage nurse and communicated with ER physician who took care of this patient.  All questions were answered. The patient knows to call the clinic with any problems questions or concerns.  cc Adin Hector, MD   Follow up plan  TBD  We spent sufficient time to discuss many aspect of care, questions were answered to patient's satisfaction.  Earlie Server 08/31/2021

## 2021-08-31 NOTE — ED Triage Notes (Signed)
Pt via POV from home. Pt was sent from the cancer center. Pt has a hx of ovarian cancer and is on active chemo. Pt c/o generalized abd pain. Pt went for a paracentesis on Wednesday and since then she started having pain and nausea. Pt is A&OX4 and NAD.

## 2021-08-31 NOTE — H&P (Signed)
History and Physical    Brittany Montoya URK:[id number] DOB: 1960-12-28 DOA: 08/31/2021  Referring MD/NP/PA:   PCP: Brittany Hector, MD   Patient coming from:  The patient is coming from home.  At baseline, pt is independent for most of ADL.        Chief Complaint: Abdominal pain  HPI: Brittany Montoya is a 60 y.o. female with medical history significant of ovarian cancer on chemotherapy, liver cirrhosis with ascites, hypertension, GERD, depression with anxiety, thrombocytopenia, PE on Eliquis, who presents with abdominal pain  Patient states that she has abdominal pain for almost 2 days.  Her abdominal pain is diffuse, constant, sharp, nonradiating, associated with nausea.  She had vomiting yesterday, no vomiting today.  No diarrhea.  She had subjective fever yesterday, no fever today.  Her body temperature is 98.4 in ED.  Patient has mild shortness breath, no cough, chest pain.  No symptoms of UTI.  ED Course: pt was found to have WBC 21.6, lactic acid 3.1, platelet 37, sodium 128, AKI with creatinine 1.26, BUN 19, temperature normal, blood pressure 122/86, heart rate 122, RR 26, oxygen saturation 92% on room air.  Patient is admitted to [facility] bed as inpatient  CT abdomen/pelvis: 1. No acute findings in the abdomen or pelvis. 2. Unchanged appearance of large cystic and solid mass of the rightovary. 3. Similar peritoneal thickening and nodularity. 4. Small to moderate volume abdominal ascites, slightly decreased when compared with prior exam. 5.  Aortic Atherosclerosis (ICD10-I70.0).  Review of Systems:   General: no fevers, chills, no body weight gain, has poor appetite, has fatigue HEENT: no blurry vision, hearing changes or sore throat Respiratory: has mild dyspnea, no coughing, wheezing CV: no chest pain, no palpitations GI: has nausea, vomiting, abdominal pain, no diarrhea, constipation GU: no dysuria, burning on urination, increased urinary frequency, hematuria   Ext: no leg edema Neuro: no unilateral weakness, numbness, or tingling, no vision change or hearing loss Skin: no rash, no skin tear. MSK: No muscle spasm, no deformity, no limitation of range of movement in spin Heme: No easy bruising.  Travel history: No recent long distant travel.  Allergy:  Allergies  Allergen Reactions   Paclitaxel Shortness Of Breath and Other (See Comments)    pt flush, stated she didn't feel well, taxol stopped (05/10/2021)   Amlodipine Other (See Comments) and Rash    Not effective Not effective    Penicillins Rash    Past Medical History:  Diagnosis Date   Anxiety    Arthritis    Ascites    Cirrhosis of liver (HCC)    DDD (degenerative disc disease), cervical    Depression    Family history of breast cancer    GERD (gastroesophageal reflux disease)    Glaucoma    Headache    migraines   Hypertension    Ovarian cancer ([place]) 04/15/2021   PONV (postoperative nausea and vomiting)    Pulmonary embolism ([place])    Sleep apnea    Venous stasis     Past Surgical History:  Procedure Laterality Date   ABDOMINAL HYSTERECTOMY N/A 2008   may have had left ovary removed   [place]     2012, 2015, 2020   COLONOSCOPY N/A 06/29/2021   Procedure: COLONOSCOPY;  Surgeon: Brittany Montoya, Brittany Pike, MD;  Location: [facility] ENDOSCOPY;  Service: Gastroenterology;  Laterality: N/A;  DOCTORS ARE IN AGREEMENT THAT  Brittany Montoya CAN DO THIS PROCEDURE IN RUSSO'S BLOCK   ESOPHAGOGASTRODUODENOSCOPY N/A 06/29/2021   Procedure: ESOPHAGOGASTRODUODENOSCOPY (EGD);  Surgeon: Brittany Montoya, Brittany Pike, MD;  Location: [facility] ENDOSCOPY;  Service: Gastroenterology;  Laterality: N/A;   PARACENTESIS  04/20/2021   PILONIDAL CYST / SINUS EXCISION     PORTACATH PLACEMENT Right 06/01/2021   Procedure: INSERTION PORT-A-CATH;  Surgeon: Brittany Pun, MD;  Location: [facility] ORS;  Service: General;  Laterality: Right;    Social History:  reports  that she has never smoked. She has never used smokeless tobacco. She reports that she does not drink alcohol and does not use drugs.  Family History:  Family History  Problem Relation Age of Onset   Breast cancer Mother 2   Breast cancer Maternal Aunt        d. under 109     Prior to Admission medications   Medication Sig Start Date End Date Taking? Authorizing Provider  acetaminophen (TYLENOL) 500 MG tablet Take 1,000 mg by mouth at bedtime.   Yes [provider]  albuterol (VENTOLIN HFA) 108 (90 Base) MCG/ACT inhaler Inhale 1-2 puffs into the lungs every 6 (six) hours as needed for wheezing or shortness of breath. 03/03/21  Yes [provider]  hydrocortisone butyrate (LUCOID) 0.1 % CREA cream Apply 1 application topically daily. 08/03/21  Yes Brittany Montoya, Brittany Boys, NP  lidocaine-prilocaine (EMLA) cream Apply to affected area once Patient taking differently: Apply 1 application topically daily as needed (pain). Apply to affected area once 04/15/21  Yes Brittany Server, MD  nystatin (MYCOSTATIN) 100000 UNIT/ML suspension Use as directed 5 mLs (500,000 Units total) in the mouth or throat 4 (four) times daily. 08/03/21  Yes Brittany Montoya, Brittany Boys, NP  triamcinolone cream (KENALOG) 0.1 % Apply 1 application topically daily as needed (irritation). 03/20/21  Yes [provider]  apixaban (ELIQUIS) 5 MG TABS tablet Take 1 tablet (5 mg total) by mouth 2 (two) times daily. 08/24/21   Brittany Server, MD  clonazePAM (KLONOPIN) 0.5 MG tablet Take 1 tablet (0.5 mg total) by mouth daily as needed for anxiety. 08/24/21   Brittany Server, MD  dexamethasone (DECADRON) 4 MG tablet Take 2 tablets (8 mg total) by mouth See admin instructions. Take 8mg  daily for 2 days prior to your chemotherapy Patient not taking: Reported on 08/31/2021 05/10/21   Brittany Server, MD  DULoxetine (CYMBALTA) 30 MG capsule Take 1 capsule (30 mg total) by mouth daily. 08/24/21   Brittany Server, MD  hydrochlorothiazide (HYDRODIURIL) 12.5 MG tablet Take  12.5 mg by mouth daily. 12/03/14   [provider]  latanoprost (XALATAN) 0.005 % ophthalmic solution Place 1 drop into both eyes at bedtime. 01/06/21   [provider]  losartan (COZAAR) 50 MG tablet Take 50 mg by mouth daily. 03/16/21   [provider]  Multiple Vitamin (MULTIVITAMIN WITH MINERALS) TABS tablet Take 1 tablet by mouth daily.    [provider]  ondansetron (ZOFRAN) 8 MG tablet Take 1 tablet (8 mg total) by mouth 2 (two) times daily as needed for refractory nausea / vomiting. Start on day 3 after carboplatin chemo. 04/15/21   Brittany Server, MD  potassium chloride SA (KLOR-CON) 20 MEQ tablet Take 1 tablet (20 mEq total) by mouth 2 (two) times daily. 07/09/21   Brittany Server, MD  prochlorperazine (COMPAZINE) 10 MG tablet Take 1 tablet (10 mg total) by mouth every 6 (six) hours as needed (Nausea or vomiting). 04/15/21   Brittany Server, MD  timolol (TIMOPTIC) 0.5 % ophthalmic solution  Place 1 drop into both eyes 2 (two) times daily.    [provider]  traMADol (ULTRAM) 50 MG tablet Take 50 mg by mouth every 6 (six) hours as needed.    [provider]    Physical Exam: Vitals:   08/31/21 1600 08/31/21 1630 08/31/21 1731 08/31/21 1830  BP: 111/74 123/79  (!) 114/46  Pulse: 96 (!) 107  94  Resp: 19 17  18   Temp:   99.1 F (37.3 C) 98.7 F (37.1 C)  TempSrc:   Oral Oral  SpO2: 97% 99%  97%  Height:       General: Not in acute distress HEENT:       Eyes: PERRL, EOMI, no scleral icterus.       ENT: No discharge from the ears and nose, no pharynx injection, no tonsillar enlargement.        Neck: No JVD, no bruit, no mass felt. Heme: No neck lymph node enlargement. Cardiac: S1/S2, RRR, No murmurs, No gallops or rubs. Respiratory: No rales, wheezing, rhonchi or rubs. GI: distended, diffusely tender, no organomegaly, BS present. GU: No hematuria Ext: No pitting leg edema bilaterally. 1+DP/PT pulse bilaterally. Musculoskeletal: No joint  deformities, No joint redness or warmth, no limitation of ROM in spin. Skin: No rashes.  Neuro: Alert, oriented X3, cranial nerves II-XII grossly intact, moves all extremities normally.  Psych: Patient is not psychotic, no suicidal or hemocidal ideation.  Labs on Admission: I have personally reviewed following labs and imaging studies  CBC: Recent Labs  Lab 08/30/21 1500 08/31/21 0904  WBC 17.8* 21.6*  NEUTROABS 15.6* 19.3*  HGB 10.2* 9.9*  HCT 29.0* 28.9*  MCV 99.3 99.7  PLT 39* 31*   Basic Metabolic Panel: Recent Labs  Lab 08/30/21 1500 08/31/21 0904 08/31/21 1705  NA 129* 128* 130*  K 3.9 3.6 3.4*  CL 90* 91* 97*  CO2 25 24 25   GLUCOSE 108* 169* 105*  BUN 11 19 17   CREATININE 0.86 1.26* 1.03*  CALCIUM 9.2 9.0 8.3*   GFR: Estimated Creatinine Clearance: 57 mL/min (A) (by C-G formula based on SCr of 1.03 mg/dL (H)). Liver Function Tests: Recent Labs  Lab 08/30/21 1500 08/31/21 0904  AST 36 37  ALT 25 25  ALKPHOS 217* 214*  BILITOT 0.6 0.6  PROT 6.4* 6.4*  ALBUMIN 2.6* 2.6*   Recent Labs  Lab 08/31/21 1136  LIPASE 31   No results for input(s): AMMONIA in the last 168 hours. Coagulation Profile: No results for input(s): INR, PROTIME in the last 168 hours. Cardiac Enzymes: No results for input(s): CKTOTAL, CKMB, CKMBINDEX, TROPONINI in the last 168 hours. BNP (last 3 results) No results for input(s): PROBNP in the last 8760 hours. HbA1C: No results for input(s): HGBA1C in the last 72 hours. CBG: No results for input(s): GLUCAP in the last 168 hours. Lipid Profile: No results for input(s): CHOL, HDL, LDLCALC, TRIG, CHOLHDL, LDLDIRECT in the last 72 hours. Thyroid Function Tests: No results for input(s): TSH, T4TOTAL, FREET4, T3FREE, THYROIDAB in the last 72 hours. Anemia Panel: No results for input(s): VITAMINB12, FOLATE, FERRITIN, TIBC, IRON, RETICCTPCT in the last 72 hours. Urine analysis:    Component Value Date/Time   COLORURINE YELLOW (A)  08/31/2021 1705   APPEARANCEUR CLEAR (A) 08/31/2021 1705   LABSPEC >1.046 (H) 08/31/2021 1705   PHURINE 5.0 08/31/2021 1705   GLUCOSEU NEGATIVE 08/31/2021 1705   HGBUR NEGATIVE 08/31/2021 [place] 08/31/2021 1705   KETONESUR 5 (A) 08/31/2021  [place] 08/31/2021 1705   NITRITE NEGATIVE 08/31/2021 1705   LEUKOCYTESUR NEGATIVE 08/31/2021 1705   Sepsis Labs: @LABRCNTIP (procalcitonin:4,lacticidven:4) ) Recent Results (from the past 240 hour(s))  Resp Panel by RT-PCR (Flu A&B, Covid) Nasopharyngeal Swab     Status: None   Collection Time: 08/31/21  1:03 PM   Specimen: Nasopharyngeal Swab; Nasopharyngeal(NP) swabs in vial transport medium  Result Value Ref Range Status   SARS Coronavirus 2 by RT PCR NEGATIVE NEGATIVE Final    Comment: (NOTE) SARS-CoV-2 target nucleic acids are NOT DETECTED.  The SARS-CoV-2 RNA is generally detectable in upper respiratory specimens during the acute phase of infection. The lowest concentration of SARS-CoV-2 viral copies this assay can detect is 138 copies/mL. A negative result does not preclude SARS-Cov-2 infection and should not be used as the sole basis for treatment or other patient management decisions. A negative result may occur with  improper specimen collection/handling, submission of specimen other than nasopharyngeal swab, presence of viral mutation(s) within the areas targeted by this assay, and inadequate number of viral copies(<138 copies/mL). A negative result must be combined with clinical observations, patient history, and epidemiological information. The expected result is Negative.  Fact Sheet for Patients:  EntrepreneurPulse.com.au  Fact Sheet for Healthcare Providers:  IncredibleEmployment.be  This test is no t yet approved or cleared by the [place] FDA and  has been authorized for detection and/or diagnosis of SARS-CoV-2 by FDA under an Emergency Use  Authorization (EUA). This EUA will remain  in effect (meaning this test can be used) for the duration of the COVID-19 declaration under Section 564(b)(1) of the Act, 21 U.S.C.section 360bbb-3(b)(1), unless the authorization is terminated  or revoked sooner.       Influenza A by PCR NEGATIVE NEGATIVE Final   Influenza B by PCR NEGATIVE NEGATIVE Final    Comment: (NOTE) The Xpert Xpress SARS-CoV-2/FLU/RSV plus assay is intended as an aid in the diagnosis of influenza from Nasopharyngeal swab specimens and should not be used as a sole basis for treatment. Nasal washings and aspirates are unacceptable for Xpert Xpress SARS-CoV-2/FLU/RSV testing.  Fact Sheet for Patients: EntrepreneurPulse.com.au  Fact Sheet for Healthcare Providers: IncredibleEmployment.be  This test is not yet approved or cleared by the [place] FDA and has been authorized for detection and/or diagnosis of SARS-CoV-2 by FDA under an Emergency Use Authorization (EUA). This EUA will remain in effect (meaning this test can be used) for the duration of the COVID-19 declaration under Section 564(b)(1) of the Act, 21 U.S.C. section 360bbb-3(b)(1), unless the authorization is terminated or revoked.  Performed at [facility], [address] [place], [place] [address]      Radiological Exams on Admission: CT ABDOMEN PELVIS W CONTRAST  Result Date: 08/31/2021 CLINICAL DATA:  Abdominal pain, history of ovarian cancer EXAM: CT ABDOMEN AND PELVIS WITH CONTRAST TECHNIQUE: Multidetector CT imaging of the abdomen and pelvis was performed using the standard protocol following bolus administration of intravenous contrast. CONTRAST:  149mL OMNIPAQUE IOHEXOL 300 MG/ML  SOLN COMPARISON:  CT abdomen and pelvis dated June 25, 2021 FINDINGS: Lower chest: No acute abnormality. Hepatobiliary: No focal liver abnormality is seen. Status post cholecystectomy. No biliary dilatation. In  Pancreas: Unremarkable. No pancreatic ductal dilatation or surrounding inflammatory changes. Spleen: Normal in size without focal abnormality. Adrenals/Urinary Tract: Adrenal glands are unremarkable. Kidneys are normal, without renal calculi, focal lesion, or hydronephrosis. Bladder is unremarkable. Stomach/Bowel: Stomach is within normal limits. Appendix appears normal. No evidence of bowel  wall thickening, distention, or inflammatory changes. Vascular/Lymphatic: Aortic atherosclerosis. No enlarged abdominal or pelvic lymph nodes. Reproductive: Uterus is surgically absent. Unchanged appearance of large cystic and solid mass of the right ovary, measures approximately 1.9 x 1.3 cm, when measured on coronal image, unchanged in size when remeasured in similar plane on prior exam. Other: Small fluid containing umbilical hernia. Similar peritoneal thickening and nodularity. Small to moderate volume abdominal ascites, slightly decreased when compared to the prior exam. Musculoskeletal: No acute or significant osseous findings. IMPRESSION: 1. No acute findings in the abdomen or pelvis. 2. Unchanged appearance of large cystic and solid mass of the right ovary. 3. Similar peritoneal thickening and nodularity. 4. Small to moderate volume abdominal ascites, slightly decreased when compared with prior exam. 5.  Aortic Atherosclerosis (ICD10-I70.0). Electronically Signed   By: Yetta Glassman M.D.   On: 08/31/2021 12:24   US Paracentesis  Result Date: 08/31/2021 INDICATION: Patient with history of ovarian cancer, recurrent ascites, new onset abdominal pain following paracentesis 08/29/2021. Request to IR for diagnostic and therapeutic paracentesis EXAM: ULTRASOUND GUIDED DIAGNOSTIC AND THERAPEUTIC PARACENTESIS MEDICATIONS: 8 mL 1% lidocaine COMPLICATIONS: None immediate. PROCEDURE: Informed written consent was obtained from the patient after a discussion of the risks, benefits and alternatives to treatment. A timeout was  performed prior to the initiation of the procedure. Initial ultrasound scanning demonstrates a small amount of ascites within the right upper abdominal quadrant. The right upper abdomen was prepped and draped in the usual sterile fashion. 1% lidocaine was used for local anesthesia. Following this, a 19 gauge, 7-cm, Yueh catheter was introduced. An ultrasound image was saved for documentation purposes. The paracentesis was performed. The catheter was removed and a dressing was applied. The patient tolerated the procedure well without immediate post procedural complication. FINDINGS: A total of approximately 300 mL of clear, dark serosanguineous fluid was removed. Samples were sent to the laboratory as requested by the clinical team. IMPRESSION: Successful ultrasound-guided paracentesis yielding 300 milliliters of peritoneal fluid. Read by Candiss Norse, PA-C Electronically Signed   By: Miachel Roux M.D.   On: 08/31/2021 14:48     EKG: I have personally reviewed.  Sinus rhythm, QTC 422, low voltage, poor R wave progression, nonspecific to change  Assessment/Plan Principal Problem:   Abdominal pain Active Problems:   Essential hypertension   Other cirrhosis of liver (HCC)   Ovarian cancer (HCC)   Pulmonary embolus (HCC)   Depression with anxiety   Normocytic anemia   Thrombocytopenia (HCC)   Severe sepsis (HCC)   Hyponatremia   SBP (spontaneous bacterial peritonitis) ([place])  Abdominal pain and severe sepsis due to possible SBP: Patient has history of liver cirrhosis with ascites.  Also has history of ovarian cancer.  She has diffuse abdominal pain, meets criteria for severe sepsis with WBC 21.6, heart rate 122, RR 26.  Lactic acid 3.1.  Currently hemodynamically stable.  Highly suspect SBP.  Paracentesis was performed by IR.  -Admitted to MedSurg bed as inpatient -Antibiotics: Cefepime and Flagyl -Follow-up blood culture and urine culture -Follow-up fluid analysis -will get Procalcitonin  and trend lactic acid levels per sepsis protocol. -IVF: 2.5L of NS bolus in ED, followed by 75 cc/h  -give 25 g of albumine  Essential hypertension -Hold HCTZ and Cozaar due to hyponatremia and the risk of developing hypotension due to sepsis -IV hydralazine as needed  Other cirrhosis of liver [facility]): Mental status normal -Check INR, PTT, ammonia level  Ovarian cancer [facility]): On chemotherapy.  Last dose of  chemotherapy was on 08/22/2021 -Follow-up with oncology  Pulmonary embolus ([place]) -Eliquis  Depression with anxiety -Continue home medications  Normocytic anemia and Thrombocytopenia ([place]): Hemoglobin stable 9.9 (7.6 on 08/17/2021, 10.2 on 08/30/2021), platelets 31, likely due to chemotherapy -Follow-up with CBC  Hyponatremia: Sodium 128, likely due to decreased oral intake and continuation of HCTZ and Cozaar -IV fluid of normal saline as above -Follow-up BMP every 8-hour    DVT ppx: on Eliquis Code Status: Full code Family Communication:  Yes, patient's husband at bed side.    Disposition Plan:  Anticipate discharge back to previous environment Consults called:  none Admission status and Level of care: Med-Surg:     as inpt       Status is: Inpatient  Remains inpatient appropriate because: Patient has multiple comorbidities, including ovarian cancer on chemotherapy, immunosuppressed.  Presents with diffuse abdominal pain and sepsis, highly suspecting SBP.  Her presentation is highly complicated.  Patient is at high risk of deteriorating.  Will need to be treated in hospital for at least 2 days            Date of Service 08/31/2021    [facility]   If 7PM-7AM, please contact night-coverage [email or web address] 08/31/2021, 6:39 PM

## 2021-09-01 LAB — CBC WITH DIFFERENTIAL/PLATELET
Abs Immature Granulocytes: 0.28 10*3/uL — ABNORMAL HIGH (ref 0.00–0.07)
Abs Immature Granulocytes: 0.4 10*3/uL — ABNORMAL HIGH (ref 0.00–0.07)
Basophils Absolute: 0.1 10*3/uL (ref 0.0–0.1)
Basophils Absolute: 0.2 10*3/uL — ABNORMAL HIGH (ref 0.0–0.1)
Basophils Relative: 1 %
Basophils Relative: 1 %
Eosinophils Absolute: 0 10*3/uL (ref 0.0–0.5)
Eosinophils Absolute: 0 10*3/uL (ref 0.0–0.5)
Eosinophils Relative: 0 %
Eosinophils Relative: 0 %
HCT: 23.3 % — ABNORMAL LOW (ref 36.0–46.0)
HCT: 24.4 % — ABNORMAL LOW (ref 36.0–46.0)
Hemoglobin: 8 g/dL — ABNORMAL LOW (ref 12.0–15.0)
Hemoglobin: 8.5 g/dL — ABNORMAL LOW (ref 12.0–15.0)
Immature Granulocytes: 1 %
Immature Granulocytes: 2 %
Lymphocytes Relative: 5 %
Lymphocytes Relative: 6 %
Lymphs Abs: 1.1 10*3/uL (ref 0.7–4.0)
Lymphs Abs: 1.6 10*3/uL (ref 0.7–4.0)
MCH: 34.9 pg — ABNORMAL HIGH (ref 26.0–34.0)
MCH: 35.6 pg — ABNORMAL HIGH (ref 26.0–34.0)
MCHC: 34.3 g/dL (ref 30.0–36.0)
MCHC: 34.8 g/dL (ref 30.0–36.0)
MCV: 101.7 fL — ABNORMAL HIGH (ref 80.0–100.0)
MCV: 102.1 fL — ABNORMAL HIGH (ref 80.0–100.0)
Monocytes Absolute: 1.2 10*3/uL — ABNORMAL HIGH (ref 0.1–1.0)
Monocytes Absolute: 1.5 10*3/uL — ABNORMAL HIGH (ref 0.1–1.0)
Monocytes Relative: 5 %
Monocytes Relative: 5 %
Neutro Abs: 21.5 10*3/uL — ABNORMAL HIGH (ref 1.7–7.7)
Neutro Abs: 23.5 10*3/uL — ABNORMAL HIGH (ref 1.7–7.7)
Neutrophils Relative %: 88 %
Neutrophils Relative %: 86 %
Platelets: 21 10*3/uL — CL (ref 150–400)
Platelets: 21 10*3/uL — CL (ref 150–400)
RBC: 2.29 MIL/uL — ABNORMAL LOW (ref 3.87–5.11)
RBC: 2.39 MIL/uL — ABNORMAL LOW (ref 3.87–5.11)
RDW: 17 % — ABNORMAL HIGH (ref 11.5–15.5)
RDW: 17.2 % — ABNORMAL HIGH (ref 11.5–15.5)
Smear Review: DECREASED
Smear Review: DECREASED
WBC: 24.3 10*3/uL — ABNORMAL HIGH (ref 4.0–10.5)
WBC: 27.1 10*3/uL — ABNORMAL HIGH (ref 4.0–10.5)
nRBC: 0 % (ref 0.0–0.2)
nRBC: 0 % (ref 0.0–0.2)

## 2021-09-01 LAB — BASIC METABOLIC PANEL
Anion gap: 7 (ref 5–15)
Anion gap: 6 (ref 5–15)
BUN: 18 mg/dL (ref 8–23)
BUN: 15 mg/dL (ref 8–23)
CO2: 29 mmol/L (ref 22–32)
CO2: 25 mmol/L (ref 22–32)
Calcium: 8.3 mg/dL — ABNORMAL LOW (ref 8.9–10.3)
Calcium: 8 mg/dL — ABNORMAL LOW (ref 8.9–10.3)
Chloride: 99 mmol/L (ref 98–111)
Chloride: 102 mmol/L (ref 98–111)
Creatinine, Ser: 0.97 mg/dL (ref 0.44–1.00)
Creatinine, Ser: 0.88 mg/dL (ref 0.44–1.00)
GFR, Estimated: >60 mL/min (ref >60)
GFR, Estimated: >60 mL/min (ref >60)
Glucose, Bld: 99 mg/dL (ref 70–99)
Glucose, Bld: 85 mg/dL (ref 70–99)
Potassium: 3.8 mmol/L (ref 3.5–5.1)
Potassium: 3.9 mmol/L (ref 3.5–5.1)
Sodium: 135 mmol/L (ref 135–145)
Sodium: 133 mmol/L — ABNORMAL LOW (ref 135–145)

## 2021-09-01 LAB — CBC
HCT: 21.7 % — ABNORMAL LOW (ref 36.0–46.0)
Hemoglobin: 7.4 g/dL — ABNORMAL LOW (ref 12.0–15.0)
MCH: 34.6 pg — ABNORMAL HIGH (ref 26.0–34.0)
MCHC: 34.1 g/dL (ref 30.0–36.0)
MCV: 101.4 fL — ABNORMAL HIGH (ref 80.0–100.0)
Platelets: 21 10*3/uL — CL (ref 150–400)
RBC: 2.14 MIL/uL — ABNORMAL LOW (ref 3.87–5.11)
RDW: 17.1 % — ABNORMAL HIGH (ref 11.5–15.5)
WBC: 20.5 10*3/uL — ABNORMAL HIGH (ref 4.0–10.5)
nRBC: 0 % (ref 0.0–0.2)

## 2021-09-01 LAB — CA 125: Cancer Antigen (CA) 125: 29.4 U/mL (ref 0.0–38.1)

## 2021-09-01 MED ORDER — SODIUM CHLORIDE 0.9 % IV SOLN
12.5000 mg | Freq: Four times a day (QID) | INTRAVENOUS | Status: DC | PRN
  Administered 2021-09-01 – 2021-09-03 (×3): 12.5 mg via INTRAVENOUS
  Filled 2021-09-01: qty 0.5
  Filled 2021-09-01 (×2): qty 12.5

## 2021-09-01 MED ORDER — SENNA 8.6 MG PO TABS
1.0000 | ORAL_TABLET | Freq: Every day | ORAL | Status: DC
  Administered 2021-09-01: 8.6 mg via ORAL
  Filled 2021-09-01 (×3): qty 1

## 2021-09-01 MED ORDER — SODIUM CHLORIDE 0.9 % IV SOLN
2.0000 g | Freq: Three times a day (TID) | INTRAVENOUS | Status: DC
Start: 2021-09-01 — End: 2021-09-03
  Administered 2021-09-01 – 2021-09-03 (×6): 2 g via INTRAVENOUS
  Filled 2021-09-01 (×9): qty 2

## 2021-09-01 MED ORDER — CHLORHEXIDINE GLUCONATE CLOTH 2 % EX PADS
6.0000 | MEDICATED_PAD | Freq: Every day | CUTANEOUS | Status: DC
  Administered 2021-09-01 – 2021-09-04 (×4): 6 via TOPICAL

## 2021-09-01 MED ORDER — POLYETHYLENE GLYCOL 3350 17 G PO PACK
17.0000 g | PACK | Freq: Every day | ORAL | Status: DC
  Administered 2021-09-01 – 2021-09-04 (×4): 17 g via ORAL
  Filled 2021-09-01 (×4): qty 1

## 2021-09-01 NOTE — Consult Note (Signed)
[place]  [phone] [phone]  ID: Brittany Montoya OB: October 22, 1960  MR#: [id number]  OIN#:[id number]  Patient Care Team: Adin Hector, MD as PCP - General (Internal Medicine) Earlie Server, MD as Consulting Physician (Hematology and Oncology) Clent Jacks, RN as Oncology Nurse Navigator  CHIEF COMPLAINT: Abdominal pain   INTERVAL HISTORY: Brittany Montoya is a 59 year old female with past medical history significant for anxiety, arthritis, cirrhosis of liver, GERD, depression and history of pulmonary embolus on Eliquis who is followed by Dr. Tasia Catchings for ovarian cancer.  She is presently undergoing chemotherapy and frequent paracentesis.  She was evaluated yesterday by Dr. Tasia Catchings for nausea, vomiting, abdominal pain and tachycardia and ultimately was sent to the emergency room.  She had been seen the day before for same complaint and received IV fluids, antiemetics and dexamethasone with some improvement of her symptoms.  Went home and was unable to keep anything down and worsening abdominal pain.  She is now admitted for likely spontaneous bacterial peritonitis and we are r/o sepsis.  She had paracentesis on admission with 300 mL of fluid was removed and sent for cytology. Cytology and blood cultures are pending. She is currently on IV antibiotics. Lab work showed anemia with a hemoglobin of 7.4, thrombocytopenia platelet count of 21,000 and white blood cell count 20.5.   Reports feeling pretty good today. She is able to eat a little but feel full quickly.  Mild nausea.  Abdominal pain is stable and improving.  Has not been taking tramadol due to feeling constipated.  Just had second dose of morphine.  Has not had MiraLAX in several days.  Denies any fevers, shortness of breath or chest pain.  Denies any bleeding.  REVIEW OF SYSTEMS:   Review of Systems  Constitutional:  Positive for malaise/fatigue. Negative for chills, fever and weight loss.   HENT:  Negative for congestion, ear pain and tinnitus.   Eyes: Negative.  Negative for blurred vision and double vision.  Respiratory: Negative.  Negative for cough, sputum production and shortness of breath.   Cardiovascular: Negative.  Negative for chest pain, palpitations and leg swelling.  Gastrointestinal:  Positive for abdominal pain and constipation. Negative for diarrhea, nausea and vomiting.  Genitourinary:  Negative for dysuria, frequency and urgency.  Musculoskeletal:  Negative for back pain and falls.  Skin: Negative.  Negative for rash.  Neurological: Negative.  Negative for weakness and headaches.  Endo/Heme/Allergies: Negative.  Does not bruise/bleed easily.  Psychiatric/Behavioral: Negative.  Negative for depression. The patient is not nervous/anxious and does not have insomnia.    As per HPI. Otherwise, a complete review of systems is negative.  PAST MEDICAL HISTORY: Past Medical History:  Diagnosis Date   Anxiety    Arthritis    Ascites    Cirrhosis of liver ([place])    DDD (degenerative disc disease), cervical    Depression    Family history of breast cancer    GERD (gastroesophageal reflux disease)    Glaucoma    Headache    migraines   Hypertension    Ovarian cancer ([place]) 04/15/2021   PONV (postoperative nausea and vomiting)    Pulmonary embolism ([place])    Sleep apnea    Venous stasis     PAST SURGICAL HISTORY: Past Surgical History:  Procedure Laterality Date   ABDOMINAL HYSTERECTOMY N/A 2008   may have had left ovary removed   [place]  COLONOSCOPY     2012, 2015, 2020   COLONOSCOPY N/A 06/29/2021   Procedure: COLONOSCOPY;  Surgeon: [place], Benay Pike, MD;  Location: [facility] ENDOSCOPY;  Service: Gastroenterology;  Laterality: N/A;  DOCTORS ARE IN AGREEMENT THAT TOLEDO CAN DO THIS PROCEDURE IN RUSSO'S BLOCK   ESOPHAGOGASTRODUODENOSCOPY N/A 06/29/2021   Procedure: ESOPHAGOGASTRODUODENOSCOPY (EGD);  Surgeon:  [place], Benay Pike, MD;  Location: [facility] ENDOSCOPY;  Service: Gastroenterology;  Laterality: N/A;   PARACENTESIS  04/20/2021   PILONIDAL CYST / SINUS EXCISION     PORTACATH PLACEMENT Right 06/01/2021   Procedure: INSERTION PORT-A-CATH;  Surgeon: Herbert Pun, MD;  Location: [facility] ORS;  Service: General;  Laterality: Right;    FAMILY HISTORY: Family History  Problem Relation Age of Onset   Breast cancer Mother 32   Breast cancer Maternal Aunt        d. under 9    ADVANCED DIRECTIVES (Y/N):  @ADVDIR @  HEALTH MAINTENANCE: Social History   Tobacco Use   Smoking status: Never   Smokeless tobacco: Never  Vaping Use   Vaping Use: Never used  Substance Use Topics   Alcohol use: No   Drug use: No     Colonoscopy:  PAP:  Bone density:  Lipid panel:  Allergies  Allergen Reactions   Paclitaxel Shortness Of Breath and Other (See Comments)    pt flush, stated she didn't feel well, taxol stopped (05/10/2021)   Amlodipine Other (See Comments) and Rash    Not effective Not effective    Penicillins Rash    Current Facility-Administered Medications  Medication Dose Route Frequency Provider Last Rate Last Admin   0.9 %  sodium chloride infusion   Intravenous Continuous Ivor Costa, MD 75 mL/hr at 09/01/21 0836 New Bag at 09/01/21 0836   acetaminophen (TYLENOL) tablet 650 mg  650 mg Oral Q6H PRN Ivor Costa, MD   650 mg at 08/31/21 2251   albuterol (PROVENTIL) (2.5 MG/3ML) 0.083% nebulizer solution 2.5 mg  2.5 mg Nebulization Q4H PRN Ivor Costa, MD       ceFEPIme (MAXIPIME) 2 g in sodium chloride 0.9 % 100 mL IVPB  2 g Intravenous Q12H Rauer, Samantha O, RPH 200 mL/hr at 09/01/21 0035 2 g at 09/01/21 0035   Chlorhexidine Gluconate Cloth 2 % PADS 6 each  6 each Topical Daily Ivor Costa, MD   6 each at 09/01/21 0836   clonazePAM (KLONOPIN) tablet 0.5 mg  0.5 mg Oral Daily PRN Ivor Costa, MD   0.5 mg at 08/31/21 2251   DULoxetine (CYMBALTA) DR capsule 30 mg  30 mg Oral Daily Ivor Costa, MD       famotidine (PEPCID) tablet 20 mg  20 mg Oral Daily Sharion Settler, NP   20 mg at 09/01/21 0843   hydrALAZINE (APRESOLINE) injection 5 mg  5 mg Intravenous Q2H PRN Ivor Costa, MD       latanoprost (XALATAN) 0.005 % ophthalmic solution 1 drop  1 drop Both Eyes QHS Ivor Costa, MD   1 drop at 08/31/21 2302   lidocaine-prilocaine (EMLA) cream 1 application  1 application Topical Daily PRN Ivor Costa, MD       metroNIDAZOLE (FLAGYL) IVPB 500 mg  500 mg Intravenous Q12H Ivor Costa, MD 100 mL/hr at 09/01/21 0839 500 mg at 09/01/21 0839   morphine 2 MG/ML injection 2 mg  2 mg Intravenous Q4H PRN Ivor Costa, MD       multivitamin with minerals tablet 1 tablet  1 tablet Oral Daily Ivor Costa, MD  1 tablet at 09/01/21 0843   ondansetron (ZOFRAN) injection 4 mg  4 mg Intravenous Q8H PRN Ivor Costa, MD       timolol (TIMOPTIC) 0.5 % ophthalmic solution 1 drop  1 drop Both Eyes BID Ivor Costa, MD       traMADol Veatrice Bourbon) tablet 50 mg  50 mg Oral Q6H PRN Ivor Costa, MD       triamcinolone cream (KENALOG) 0.1 % cream 1 application  1 application Topical Daily PRN Ivor Costa, MD       Facility-Administered Medications Ordered in Other Encounters  Medication Dose Route Frequency Provider Last Rate Last Admin   LORazepam (ATIVAN) injection 0.5 mg  0.5 mg Intravenous Once PRN Earlie Server, MD        OBJECTIVE: Vitals:   09/01/21 0559 09/01/21 0753  BP: 98/69 109/72  Pulse: 88 88  Resp: 20 16  Temp: 98.1 F (36.7 C) 97.9 F (36.6 C)  SpO2: 100% 100%     Body mass index is 35.66 kg/m.    ECOG FS:2 - Symptomatic, <50% confined to bed  Physical Exam Constitutional:      Appearance: Normal appearance.  HENT:     Head: Normocephalic and atraumatic.  Eyes:     Pupils: Pupils are equal, round, and reactive to light.  Cardiovascular:     Rate and Rhythm: Normal rate and regular rhythm.     Heart sounds: Normal heart sounds. No murmur heard. Pulmonary:     Effort: Pulmonary effort is normal.      Breath sounds: Normal breath sounds. No wheezing.  Abdominal:     General: Bowel sounds are normal. There is no distension.     Palpations: Abdomen is soft.     Tenderness: There is no abdominal tenderness.  Musculoskeletal:        General: Normal range of motion.     Cervical back: Normal range of motion.  Skin:    General: Skin is warm and dry.     Findings: No rash.  Neurological:     Mental Status: She is alert and oriented to person, place, and time.  Psychiatric:        Judgment: Judgment normal.    LAB RESULTS:  Lab Results  Component Value Date   NA 133 (L) 09/01/2021   K 3.9 09/01/2021   CL 102 09/01/2021   CO2 25 09/01/2021   GLUCOSE 85 09/01/2021   BUN 15 09/01/2021   CREATININE 0.88 09/01/2021   CALCIUM 8.0 (L) 09/01/2021   PROT 6.4 (L) 08/31/2021   ALBUMIN 2.6 (L) 08/31/2021   AST 37 08/31/2021   ALT 25 08/31/2021   ALKPHOS 214 (H) 08/31/2021   BILITOT 0.6 08/31/2021   GFRNONAA >60 09/01/2021   GFRAA >60 03/06/2015    Lab Results  Component Value Date   WBC 20.5 (H) 09/01/2021   NEUTROABS 19.3 (H) 08/31/2021   HGB 7.4 (L) 09/01/2021   HCT 21.7 (L) 09/01/2021   MCV 101.4 (H) 09/01/2021   PLT 21 (LL) 09/01/2021     STUDIES: CT ABDOMEN PELVIS W CONTRAST  Result Date: 08/31/2021 CLINICAL DATA:  Abdominal pain, history of ovarian cancer EXAM: CT ABDOMEN AND PELVIS WITH CONTRAST TECHNIQUE: Multidetector CT imaging of the abdomen and pelvis was performed using the standard protocol following bolus administration of intravenous contrast. CONTRAST:  153mL OMNIPAQUE IOHEXOL 300 MG/ML  SOLN COMPARISON:  CT abdomen and pelvis dated June 25, 2021 FINDINGS: Lower chest: No acute abnormality. Hepatobiliary: No focal liver abnormality  is seen. Status post cholecystectomy. No biliary dilatation. In Pancreas: Unremarkable. No pancreatic ductal dilatation or surrounding inflammatory changes. Spleen: Normal in size without focal abnormality. Adrenals/Urinary  Tract: Adrenal glands are unremarkable. Kidneys are normal, without renal calculi, focal lesion, or hydronephrosis. Bladder is unremarkable. Stomach/Bowel: Stomach is within normal limits. Appendix appears normal. No evidence of bowel wall thickening, distention, or inflammatory changes. Vascular/Lymphatic: Aortic atherosclerosis. No enlarged abdominal or pelvic lymph nodes. Reproductive: Uterus is surgically absent. Unchanged appearance of large cystic and solid mass of the right ovary, measures approximately 1.9 x 1.3 cm, when measured on coronal image, unchanged in size when remeasured in similar plane on prior exam. Other: Small fluid containing umbilical hernia. Similar peritoneal thickening and nodularity. Small to moderate volume abdominal ascites, slightly decreased when compared to the prior exam. Musculoskeletal: No acute or significant osseous findings. IMPRESSION: 1. No acute findings in the abdomen or pelvis. 2. Unchanged appearance of large cystic and solid mass of the right ovary. 3. Similar peritoneal thickening and nodularity. 4. Small to moderate volume abdominal ascites, slightly decreased when compared with prior exam. 5.  Aortic Atherosclerosis (ICD10-I70.0). Electronically Signed   By: Yetta Glassman M.D.   On: 08/31/2021 12:24   US Paracentesis  Result Date: 08/31/2021 INDICATION: Patient with history of ovarian cancer, recurrent ascites, new onset abdominal pain following paracentesis 08/29/2021. Request to IR for diagnostic and therapeutic paracentesis EXAM: ULTRASOUND GUIDED DIAGNOSTIC AND THERAPEUTIC PARACENTESIS MEDICATIONS: 8 mL 1% lidocaine COMPLICATIONS: None immediate. PROCEDURE: Informed written consent was obtained from the patient after a discussion of the risks, benefits and alternatives to treatment. A timeout was performed prior to the initiation of the procedure. Initial ultrasound scanning demonstrates a small amount of ascites within the right upper abdominal quadrant.  The right upper abdomen was prepped and draped in the usual sterile fashion. 1% lidocaine was used for local anesthesia. Following this, a 19 gauge, 7-cm, Yueh catheter was introduced. An ultrasound image was saved for documentation purposes. The paracentesis was performed. The catheter was removed and a dressing was applied. The patient tolerated the procedure well without immediate post procedural complication. FINDINGS: A total of approximately 300 mL of clear, dark serosanguineous fluid was removed. Samples were sent to the laboratory as requested by the clinical team. IMPRESSION: Successful ultrasound-guided paracentesis yielding 300 milliliters of peritoneal fluid. Read by Candiss Norse, PA-C Electronically Signed   By: Miachel Roux M.D.   On: 08/31/2021 14:48   US Paracentesis  Result Date: 08/29/2021 INDICATION: Patient with history of ovarian cancer recurrent ascites request for paracentesis. EXAM: ULTRASOUND GUIDED  PARACENTESIS MEDICATIONS: Local 1% lidocaine only. COMPLICATIONS: None immediate. PROCEDURE: Informed written consent was obtained from the patient after a discussion of the risks, benefits and alternatives to treatment. A timeout was performed prior to the initiation of the procedure. Initial ultrasound scanning demonstrates a large amount of ascites within the left lower abdominal quadrant. The left lower abdomen was prepped and draped in the usual sterile fashion. 1% lidocaine was used for local anesthesia. Following this, a 19 gauge, 7-cm, Yueh catheter was introduced. An ultrasound image was saved for documentation purposes. The paracentesis was performed. The catheter was removed and a dressing was applied. The patient tolerated the procedure well without immediate post procedural complication. FINDINGS: A total of approximately 4 L of amber colored fluid was removed. IMPRESSION: Successful ultrasound-guided paracentesis yielding 4 liters of peritoneal fluid. This exam was  performed by Tsosie Billing PA-C, and was supervised and interpreted by Dr. Vernard Gambles.  Electronically Signed   By: Lucrezia Europe M.D.   On: 08/29/2021 16:23   US Paracentesis  Result Date: 08/22/2021 INDICATION: Patient with history of ovarian cancer and recurrent ascites request for paracentesis. EXAM: ULTRASOUND GUIDED  PARACENTESIS MEDICATIONS: Local 1% lidocaine only. COMPLICATIONS: None immediate. PROCEDURE: Informed written consent was obtained from the patient after a discussion of the risks, benefits and alternatives to treatment. A timeout was performed prior to the initiation of the procedure. Initial ultrasound scanning demonstrates a large amount of ascites within the right lower abdominal quadrant. The right lower abdomen was prepped and draped in the usual sterile fashion. 1% lidocaine was used for local anesthesia. Following this, a 19 gauge, 7-cm, Yueh catheter was introduced. An ultrasound image was saved for documentation purposes. The paracentesis was performed. The catheter was removed and a dressing was applied. The patient tolerated the procedure well without immediate post procedural complication. FINDINGS: A total of approximately 4 liters of amber colored fluid was removed. IMPRESSION: Successful ultrasound-guided paracentesis yielding 4 liters of peritoneal fluid. This exam was performed by Tsosie Billing PA-C, and was supervised and interpreted by Dr. Pascal Lux. Electronically Signed   By: Sandi Mariscal M.D.   On: 08/22/2021 16:38   US Paracentesis  Result Date: 08/16/2021 INDICATION: 60 year old female with history of ovarian cancer with recurrent ascites presenting for paracentesis. EXAM: ULTRASOUND GUIDED  PARACENTESIS MEDICATIONS: None. COMPLICATIONS: None immediate. PROCEDURE: Informed written consent was obtained from the patient after a discussion of the risks, benefits and alternatives to treatment. A timeout was performed prior to the initiation of the procedure. Initial ultrasound  scanning demonstrates a large amount of ascites within the right lower abdominal quadrant. The right lower abdomen was prepped and draped in the usual sterile fashion. 1% lidocaine was used for local anesthesia. Following this, a 6 Fr Safe-T-Centesis catheter was introduced. An ultrasound image was saved for documentation purposes. The paracentesis was performed. The catheter was removed and a dressing was applied. The patient tolerated the procedure well without immediate post procedural complication. FINDINGS: A total of approximately 4 of translucent, amber fluid was removed. IMPRESSION: Successful ultrasound-guided paracentesis yielding 4 liters of peritoneal fluid. Ruthann Cancer, MD [facility] Electronically Signed   By: Ruthann Cancer M.D.   On: 08/16/2021 16:25    ASSESSMENT: Mrs. Strahm is a 60 year old female with past medical history of ovarian cancer who is currently admitted for spontaneous bacterial peritonitis meeting sepsis criteria.  PLAN: Discussed case with Dr. Tasia Catchings.  Cytopenias likely secondary to chemotherapy and SBP is likely contributing. Recommend transfusing PRBC for hemoglobin less than 7 or significant symptoms, maintain a platelet count greater than 20,000 and to hold Eliquis until plt greater then 50,000.  Awaiting results from peritoneal fluid and blood cultures. Continue antibiotics.  Recommend checking CBC with differential twice per day.   Constipation-added MiraLAX and Senokot to her daily medications.  We will continue to follow her while in the hospital.  I spent 45 minutes dedicated to the care of this patient (face-to-face and non-face-to-face) on the date of the encounter to include what is described in the assessment and plan.   Patient expressed understanding and was in agreement with this plan. She also understands that She can call clinic at any time with any questions, concerns, or complaints.     Cancer Staging  Ovarian cancer [facility]) Staging form: Ovary, Fallopian Tube, and Primary Peritoneal Carcinoma, AJCC 8th Edition - Clinical stage from 04/18/2021: FIGO Stage II, calculated  as Stage Unknown (cT2, cNX, cM0) - Signed by Earlie Server, MD on 04/19/2021 Stage prefix: Initial diagnosis   Jacquelin Hawking, NP   09/01/2021 9:16 AM

## 2021-09-01 NOTE — Progress Notes (Signed)
Chaplain On-Call responded to Spiritual Care Consult Order from RN Posey Rea: "patient requests prayer."  Chaplain met the patient and her daughter Brittany Montoya at bedside. Offered listening encouragement and support as patient described her health challenges related to ovarian cancer; chemotherapy; possible surgery.  Chaplain provided spiritual and emotional support and prayer.  Chaplain Pollyann Samples M.Div., [facility]

## 2021-09-01 NOTE — Progress Notes (Signed)
PHARMACY NOTE:  ANTIMICROBIAL RENAL DOSAGE ADJUSTMENT  Current antimicrobial regimen includes a mismatch between antimicrobial dosage and estimated renal function.  As per policy approved by the Pharmacy & Therapeutics and Medical Executive Committees, the antimicrobial dosage will be adjusted accordingly.  Current antimicrobial dosage:  Cefepime 2 g IV q12h  Indication: Peritonitis  Renal Function:  Estimated Creatinine Clearance: 66.7 mL/min (by C-G formula based on SCr of 0.88 mg/dL).  Antimicrobial dosage has been changed to:  Cefepime 2 g IV q8h   Thank you for allowing pharmacy to be a part of this patient's care.  Benita Gutter, [facility] 09/01/2021 12:30 PM

## 2021-09-01 NOTE — Progress Notes (Signed)
PROGRESS NOTE    Brittany Montoya  SFK:[id number] DOB: [birth year] DOA: 08/31/2021 PCP: Adin Hector, MD  Assessment & Plan:   Principal Problem:   Abdominal pain Active Problems:   Essential hypertension   Other cirrhosis of liver (HCC)   Ovarian cancer ([place])   Pulmonary embolus ([place])   Depression with anxiety   Normocytic anemia   Thrombocytopenia (HCC)   Severe sepsis (HCC)   Hyponatremia   SBP (spontaneous bacterial peritonitis) ([place])  Severe sepsis: met criteria w/ leukocytosis, tachycardia, tachypnea, elevated lactic acid and possibly due to SBP. S/p paracentesis which yielded 300 mL of dark serosanguineous fluid. Peritoneal fluid cx NGTD.  Continue on IV cefepime, flagyl. Blood cxs NGTD. Procal 0.12   HTN: continue to hold home dose of losartan, HCTZ as BP is low normal    Cirrhosis: not on any meds as per med rec. MELD score of 7   Ovarian cancer: last chemo 08/22/21. Onco consulted    Pulmonary embolus:  dx approx 3 months ago. Hold home dose of eliquis as pt has severe thrombocytopenia & anemia.    Depression: severity unknown. Continue on home dose of duloxetine    Bicytopenia: w/ macrocytic anemia & severe thrombocytopenia. Will transfuse if Hb < 7.0. Onco consulted    Leukocytosis: likely secondary to infection. Continue on IV abxs    Hyponatremia: trending up today. Continue on IVFs   Obesity: BMI 35.6. Complicates prognosis    DVT prophylaxis: SCDs Code Status: full  Family Communication: discussed pt's care w/ pt's family at bedside and answered their questions  Disposition Plan: depends on OT/PT recs (not consulted yet)  Level of care: Med-Surg  Status is: Inpatient  Remains inpatient appropriate because: severity of illness    Consultants:  Onco   Procedures:  Antimicrobials: flagyl & cefepime    Subjective: Pt c/o generalized weakness   Objective: Vitals:   08/31/21 2007 08/31/21 2251 09/01/21 0109 09/01/21 0559  BP:  107/69  101/66 98/69  Pulse: (!) 101  (!) 103 88  Resp: '16  20 20  ' Temp: 98.8 F (37.1 C)  98.3 F (36.8 C) 98.1 F (36.7 C)  TempSrc: Oral  Oral Oral  SpO2: 98%  97% 100%  Weight:  85.6 kg    Height:  '5\' 1"'  (1.549 m)      Intake/Output Summary (Last 24 hours) at 09/01/2021 0739 Last data filed at 09/01/2021 0300 Gross per 24 hour  Intake 1901.95 ml  Output --  Net 1901.95 ml   Filed Weights   08/31/21 2251  Weight: 85.6 kg    Examination:  General exam: Appears calm and comfortable  Respiratory system: diminished breath sounds b/l  Cardiovascular system: S1 & S2 +. No rubs, gallops or clicks.  Gastrointestinal system: Abdomen is obese, soft and nontender. Normal bowel sounds heard. Central nervous system: Alert and oriented. Moves all extremities  Psychiatry: Judgement and insight appear normal. Flat mood and affect     Data Reviewed: I have personally reviewed following labs and imaging studies  CBC: Recent Labs  Lab 08/30/21 1500 08/31/21 0904 09/01/21 0540  WBC 17.8* 21.6* 20.5*  NEUTROABS 15.6* 19.3*  --   HGB 10.2* 9.9* 7.4*  HCT 29.0* 28.9* 21.7*  MCV 99.3 99.7 101.4*  PLT 39* 31* 21*   Basic Metabolic Panel: Recent Labs  Lab 08/30/21 1500 08/31/21 0904 08/31/21 1705 08/31/21 1912 09/01/21 0540  NA 129* 128* 130* 135 133*  K 3.9 3.6 3.4* 3.8 3.9  CL  90* 91* 97* 99 102  CO2 '25 24 25 29 25  ' GLUCOSE 108* 169* 105* 99 85  BUN '11 19 17 18 15  ' CREATININE 0.86 1.26* 1.03* 0.97 0.88  CALCIUM 9.2 9.0 8.3* 8.3* 8.0*   GFR: Estimated Creatinine Clearance: 66.7 mL/min (by C-G formula based on SCr of 0.88 mg/dL). Liver Function Tests: Recent Labs  Lab 08/30/21 1500 08/31/21 0904  AST 36 37  ALT 25 25  ALKPHOS 217* 214*  BILITOT 0.6 0.6  PROT 6.4* 6.4*  ALBUMIN 2.6* 2.6*   Recent Labs  Lab 08/31/21 1136  LIPASE 31   Recent Labs  Lab 08/31/21 1912  AMMONIA 31   Coagulation Profile: Recent Labs  Lab 08/31/21 1912  INR 1.1    Cardiac Enzymes: No results for input(s): CKTOTAL, CKMB, CKMBINDEX, TROPONINI in the last 168 hours. BNP (last 3 results) No results for input(s): PROBNP in the last 8760 hours. HbA1C: No results for input(s): HGBA1C in the last 72 hours. CBG: No results for input(s): GLUCAP in the last 168 hours. Lipid Profile: No results for input(s): CHOL, HDL, LDLCALC, TRIG, CHOLHDL, LDLDIRECT in the last 72 hours. Thyroid Function Tests: No results for input(s): TSH, T4TOTAL, FREET4, T3FREE, THYROIDAB in the last 72 hours. Anemia Panel: No results for input(s): VITAMINB12, FOLATE, FERRITIN, TIBC, IRON, RETICCTPCT in the last 72 hours. Sepsis Labs: Recent Labs  Lab 08/31/21 1136 08/31/21 1705  PROCALCITON  --  0.12  LATICACIDVEN 3.1*  --     Recent Results (from the past 240 hour(s))  Blood culture (routine x 2)     Status: None (Preliminary result)   Collection Time: 08/31/21 11:16 AM   Specimen: BLOOD LEFT ARM  Result Value Ref Range Status   Specimen Description BLOOD LEFT ARM  Final   Special Requests   Final    BOTTLES DRAWN AEROBIC AND ANAEROBIC Blood Culture adequate volume   Culture   Final    NO GROWTH < 24 HOURS Performed at [facility], [address] [place], [place] [address]    Report Status PENDING  Incomplete  Blood culture (routine x 2)     Status: None (Preliminary result)   Collection Time: 08/31/21 11:37 AM   Specimen: BLOOD  Result Value Ref Range Status   Specimen Description BLOOD LEFT ANTECUBITAL  Final   Special Requests   Final    BOTTLES DRAWN AEROBIC AND ANAEROBIC Blood Culture adequate volume   Culture   Final    NO GROWTH < 24 HOURS Performed at [facility], [address], [place], [place] [address]    Report Status PENDING  Incomplete  Resp Panel by RT-PCR (Flu A&B, Covid) Nasopharyngeal Swab     Status: None   Collection Time: 08/31/21  1:03 PM   Specimen: Nasopharyngeal Swab; Nasopharyngeal(NP) swabs in vial transport  medium  Result Value Ref Range Status   SARS Coronavirus 2 by RT PCR NEGATIVE NEGATIVE Final    Comment: (NOTE) SARS-CoV-2 target nucleic acids are NOT DETECTED.  The SARS-CoV-2 RNA is generally detectable in upper respiratory specimens during the acute phase of infection. The lowest concentration of SARS-CoV-2 viral copies this assay can detect is 138 copies/mL. A negative result does not preclude SARS-Cov-2 infection and should not be used as the sole basis for treatment or other patient management decisions. A negative result may occur with  improper specimen collection/handling, submission of specimen other than nasopharyngeal swab, presence of viral mutation(s) within the areas targeted by this assay,  and inadequate number of viral copies(<138 copies/mL). A negative result must be combined with clinical observations, patient history, and epidemiological information. The expected result is Negative.  Fact Sheet for Patients:  EntrepreneurPulse.com.au  Fact Sheet for Healthcare Providers:  IncredibleEmployment.be  This test is no t yet approved or cleared by the [place] FDA and  has been authorized for detection and/or diagnosis of SARS-CoV-2 by FDA under an Emergency Use Authorization (EUA). This EUA will remain  in effect (meaning this test can be used) for the duration of the COVID-19 declaration under Section 564(b)(1) of the Act, 21 U.S.C.section 360bbb-3(b)(1), unless the authorization is terminated  or revoked sooner.       Influenza A by PCR NEGATIVE NEGATIVE Final   Influenza B by PCR NEGATIVE NEGATIVE Final    Comment: (NOTE) The Xpert Xpress SARS-CoV-2/FLU/RSV plus assay is intended as an aid in the diagnosis of influenza from Nasopharyngeal swab specimens and should not be used as a sole basis for treatment. Nasal washings and aspirates are unacceptable for Xpert Xpress SARS-CoV-2/FLU/RSV testing.  Fact Sheet for  Patients: EntrepreneurPulse.com.au  Fact Sheet for Healthcare Providers: IncredibleEmployment.be  This test is not yet approved or cleared by the [place] FDA and has been authorized for detection and/or diagnosis of SARS-CoV-2 by FDA under an Emergency Use Authorization (EUA). This EUA will remain in effect (meaning this test can be used) for the duration of the COVID-19 declaration under Section 564(b)(1) of the Act, 21 U.S.C. section 360bbb-3(b)(1), unless the authorization is terminated or revoked.  Performed at [facility], [address] [place], [place] [address]   Body fluid culture w Gram Stain     Status: None (Preliminary result)   Collection Time: 08/31/21  1:24 PM   Specimen: Peritoneal Washings; Body Fluid  Result Value Ref Range Status   Specimen Description   Final    PERITONEAL Performed at [facility], [address] [place], [place] [address]    Special Requests   Final    NONE Performed at [facility], [place], [place] [address]    Gram Stain   Final    NO SQUAMOUS EPITHELIAL CELLS SEEN FEW WBC SEEN NO ORGANISMS SEEN Performed at [facility], [place] [address], [place], [place] [address]    Culture PENDING  Incomplete   Report Status PENDING  Incomplete         Radiology Studies: CT ABDOMEN PELVIS W CONTRAST  Result Date: 08/31/2021 CLINICAL DATA:  Abdominal pain, history of ovarian cancer EXAM: CT ABDOMEN AND PELVIS WITH CONTRAST TECHNIQUE: Multidetector CT imaging of the abdomen and pelvis was performed using the standard protocol following bolus administration of intravenous contrast. CONTRAST:  124m OMNIPAQUE IOHEXOL 300 MG/ML  SOLN COMPARISON:  CT abdomen and pelvis dated June 25, 2021 FINDINGS: Lower chest: No acute abnormality. Hepatobiliary: No focal liver abnormality is seen. Status post cholecystectomy. No biliary dilatation. In Pancreas:  Unremarkable. No pancreatic ductal dilatation or surrounding inflammatory changes. Spleen: Normal in size without focal abnormality. Adrenals/Urinary Tract: Adrenal glands are unremarkable. Kidneys are normal, without renal calculi, focal lesion, or hydronephrosis. Bladder is unremarkable. Stomach/Bowel: Stomach is within normal limits. Appendix appears normal. No evidence of bowel wall thickening, distention, or inflammatory changes. Vascular/Lymphatic: Aortic atherosclerosis. No enlarged abdominal or pelvic lymph nodes. Reproductive: Uterus is surgically absent. Unchanged appearance of large cystic and solid mass of the right ovary, measures approximately 1.9 x 1.3 cm, when measured on coronal image, unchanged  in size when remeasured in similar plane on prior exam. Other: Small fluid containing umbilical hernia. Similar peritoneal thickening and nodularity. Small to moderate volume abdominal ascites, slightly decreased when compared to the prior exam. Musculoskeletal: No acute or significant osseous findings. IMPRESSION: 1. No acute findings in the abdomen or pelvis. 2. Unchanged appearance of large cystic and solid mass of the right ovary. 3. Similar peritoneal thickening and nodularity. 4. Small to moderate volume abdominal ascites, slightly decreased when compared with prior exam. 5.  Aortic Atherosclerosis (ICD10-I70.0). Electronically Signed   By: Yetta Glassman M.D.   On: 08/31/2021 12:24   US Paracentesis  Result Date: 08/31/2021 INDICATION: Patient with history of ovarian cancer, recurrent ascites, new onset abdominal pain following paracentesis 08/29/2021. Request to IR for diagnostic and therapeutic paracentesis EXAM: ULTRASOUND GUIDED DIAGNOSTIC AND THERAPEUTIC PARACENTESIS MEDICATIONS: 8 mL 1% lidocaine COMPLICATIONS: None immediate. PROCEDURE: Informed written consent was obtained from the patient after a discussion of the risks, benefits and alternatives to treatment. A timeout was performed  prior to the initiation of the procedure. Initial ultrasound scanning demonstrates a small amount of ascites within the right upper abdominal quadrant. The right upper abdomen was prepped and draped in the usual sterile fashion. 1% lidocaine was used for local anesthesia. Following this, a 19 gauge, 7-cm, Yueh catheter was introduced. An ultrasound image was saved for documentation purposes. The paracentesis was performed. The catheter was removed and a dressing was applied. The patient tolerated the procedure well without immediate post procedural complication. FINDINGS: A total of approximately 300 mL of clear, dark serosanguineous fluid was removed. Samples were sent to the laboratory as requested by the clinical team. IMPRESSION: Successful ultrasound-guided paracentesis yielding 300 milliliters of peritoneal fluid. Read by Candiss Norse, PA-C Electronically Signed   By: Miachel Roux M.D.   On: 08/31/2021 14:48        Scheduled Meds:  Chlorhexidine Gluconate Cloth  6 each Topical Daily   DULoxetine  30 mg Oral Daily   famotidine  20 mg Oral Daily   latanoprost  1 drop Both Eyes QHS   multivitamin with minerals  1 tablet Oral Daily   timolol  1 drop Both Eyes BID   Continuous Infusions:  sodium chloride 75 mL/hr at 08/31/21 1917   ceFEPime (MAXIPIME) IV 2 g (09/01/21 0035)   metronidazole 500 mg (08/31/21 2259)     LOS: 1 day    Time spent: 35 mins     Wyvonnia Dusky, MD [facility] Pager [phone]  If 7PM-7AM, please contact night-coverage 09/01/2021, 7:39 AM

## 2021-09-01 NOTE — Progress Notes (Signed)
Not low enough to transfuse per Randol Kern, NP. Will communicate results to day shift RN.   09/01/21 0645  Provider Notification  Provider Name/Title Sharion Settler NP  Date Provider Notified 09/01/21  Time Provider Notified [phone]  Notification Type Page  Notification Reason Critical result  Test performed and critical result Platelets 21  Date Critical Result Received 09/01/21  Time Critical Result Received 0640  Provider response No new orders  Date of Provider Response 09/01/21  Time of Provider Response [phone]

## 2021-09-02 ENCOUNTER — Inpatient Hospital Stay: Payer: BC Managed Care – PPO

## 2021-09-02 DIAGNOSIS — K652 Spontaneous bacterial peritonitis: Secondary | ICD-10-CM

## 2021-09-02 DIAGNOSIS — A419 Sepsis, unspecified organism: Principal | ICD-10-CM

## 2021-09-02 LAB — COMPREHENSIVE METABOLIC PANEL
ALT: 17 U/L (ref 0–44)
AST: 24 U/L (ref 15–41)
Albumin: 2.5 g/dL — ABNORMAL LOW (ref 3.5–5.0)
Alkaline Phosphatase: 147 U/L — ABNORMAL HIGH (ref 38–126)
Anion gap: 9 (ref 5–15)
BUN: 12 mg/dL (ref 8–23)
CO2: 23 mmol/L (ref 22–32)
Calcium: 9 mg/dL (ref 8.9–10.3)
Chloride: 101 mmol/L (ref 98–111)
Creatinine, Ser: 0.85 mg/dL (ref 0.44–1.00)
GFR, Estimated: >60 mL/min (ref >60)
Glucose, Bld: 105 mg/dL — ABNORMAL HIGH (ref 70–99)
Potassium: 3.8 mmol/L (ref 3.5–5.1)
Sodium: 133 mmol/L — ABNORMAL LOW (ref 135–145)
Total Bilirubin: 0.8 mg/dL (ref 0.3–1.2)
Total Protein: 5.7 g/dL — ABNORMAL LOW (ref 6.5–8.1)

## 2021-09-02 LAB — CBC WITH DIFFERENTIAL/PLATELET
Abs Immature Granulocytes: 0.25 10*3/uL — ABNORMAL HIGH (ref 0.00–0.07)
Abs Immature Granulocytes: 0.56 10*3/uL — ABNORMAL HIGH (ref 0.00–0.07)
Basophils Absolute: 0.2 10*3/uL — ABNORMAL HIGH (ref 0.0–0.1)
Basophils Absolute: 0 10*3/uL (ref 0.0–0.1)
Basophils Relative: 1 %
Basophils Relative: 0 %
Eosinophils Absolute: 0 10*3/uL (ref 0.0–0.5)
Eosinophils Absolute: 0 10*3/uL (ref 0.0–0.5)
Eosinophils Relative: 0 %
Eosinophils Relative: 0 %
HCT: 27.9 % — ABNORMAL LOW (ref 36.0–46.0)
HCT: 25.5 % — ABNORMAL LOW (ref 36.0–46.0)
Hemoglobin: 9.5 g/dL — ABNORMAL LOW (ref 12.0–15.0)
Hemoglobin: 8.8 g/dL — ABNORMAL LOW (ref 12.0–15.0)
Immature Granulocytes: 1 %
Immature Granulocytes: 2 %
Lymphocytes Relative: 5 %
Lymphocytes Relative: 4 %
Lymphs Abs: 1.4 10*3/uL (ref 0.7–4.0)
Lymphs Abs: 1.4 10*3/uL (ref 0.7–4.0)
MCH: 34.7 pg — ABNORMAL HIGH (ref 26.0–34.0)
MCH: 35.1 pg — ABNORMAL HIGH (ref 26.0–34.0)
MCHC: 34.1 g/dL (ref 30.0–36.0)
MCHC: 34.5 g/dL (ref 30.0–36.0)
MCV: 101.8 fL — ABNORMAL HIGH (ref 80.0–100.0)
MCV: 101.6 fL — ABNORMAL HIGH (ref 80.0–100.0)
Monocytes Absolute: 1.1 10*3/uL — ABNORMAL HIGH (ref 0.1–1.0)
Monocytes Absolute: 1.6 10*3/uL — ABNORMAL HIGH (ref 0.1–1.0)
Monocytes Relative: 4 %
Monocytes Relative: 4 %
Neutro Abs: 22.9 10*3/uL — ABNORMAL HIGH (ref 1.7–7.7)
Neutro Abs: 31.8 10*3/uL — ABNORMAL HIGH (ref 1.7–7.7)
Neutrophils Relative %: 89 %
Neutrophils Relative %: 90 %
Platelets: 24 10*3/uL — CL (ref 150–400)
Platelets: 28 10*3/uL — CL (ref 150–400)
RBC: 2.74 MIL/uL — ABNORMAL LOW (ref 3.87–5.11)
RBC: 2.51 MIL/uL — ABNORMAL LOW (ref 3.87–5.11)
RDW: 17.2 % — ABNORMAL HIGH (ref 11.5–15.5)
RDW: 17.3 % — ABNORMAL HIGH (ref 11.5–15.5)
Smear Review: DECREASED
Smear Review: NORMAL
WBC: 25.7 10*3/uL — ABNORMAL HIGH (ref 4.0–10.5)
WBC: 35.3 10*3/uL — ABNORMAL HIGH (ref 4.0–10.5)
nRBC: 0 % (ref 0.0–0.2)
nRBC: 0 % (ref 0.0–0.2)

## 2021-09-02 LAB — BODY FLUID CELL COUNT WITH DIFFERENTIAL
Eos, Fluid: 0 %
Lymphs, Fluid: 6 %
Monocyte-Macrophage-Serous Fluid: 3 %
Neutrophil Count, Fluid: 90 %
Other Cells, Fluid: 1 %
Total Nucleated Cell Count, Fluid: 2686 cu mm

## 2021-09-02 MED ORDER — PROCHLORPERAZINE EDISYLATE 10 MG/2ML IJ SOLN
5.0000 mg | Freq: Once | INTRAMUSCULAR | Status: AC
Start: 2021-09-02 — End: 2021-09-02
  Administered 2021-09-02: 06:00:00 5 mg via INTRAVENOUS
  Filled 2021-09-02: qty 1

## 2021-09-02 MED ORDER — ENSURE ENLIVE PO LIQD
237.0000 mL | Freq: Two times a day (BID) | ORAL | Status: DC
  Administered 2021-09-02 – 2021-09-04 (×6): 237 mL via ORAL

## 2021-09-02 MED ORDER — SCOPOLAMINE 1 MG/3DAYS TD PT72
1.0000 | MEDICATED_PATCH | TRANSDERMAL | Status: DC
  Administered 2021-09-02: 10:00:00 1.5 mg via TRANSDERMAL
  Filled 2021-09-02 (×2): qty 1

## 2021-09-02 MED ORDER — BLISTEX MEDICATED EX OINT
TOPICAL_OINTMENT | CUTANEOUS | Status: DC | PRN
  Filled 2021-09-02: qty 6.3

## 2021-09-02 MED ORDER — LIP MEDEX EX OINT
TOPICAL_OINTMENT | CUTANEOUS | Status: DC | PRN
  Filled 2021-09-02: qty 7

## 2021-09-02 NOTE — Progress Notes (Signed)
[place]  [phone] [phone]) [phone]  ID: Brittany Montoya OB: [birth year]  MR#: [id number]  QMV#:[id number]  Patient Care Team: Adin Hector, MD as PCP - General (Internal Medicine) Earlie Server, MD as Consulting Physician (Hematology and Oncology) Clent Jacks, RN as Oncology Nurse Navigator  CHIEF COMPLAINT: abdominal pain  INTERVAL HISTORY: Brittany Montoya is a 60 year old female with past medical history significant for anxiety, arthritis, cirrhosis of liver, GERD, depression and history of pulmonary embolus on Eliquis who is followed by Dr. Tasia Catchings for ovarian cancer.  Brittany Montoya is presently undergoing chemotherapy and frequent paracentesis.   Brittany Montoya was evaluated by Dr. Tasia Catchings on 12/16 for nausea, vomiting, abdominal pain and tachycardia and ultimately was sent to the emergency room.  Brittany Montoya had been seen the day before for same complaint and received IV fluids, antiemetics and dexamethasone with some improvement of her symptoms.  Went home and was unable to keep anything down and worsening abdominal pain.   Brittany Montoya is now admitted for likely spontaneous bacterial peritonitis and we are r/o sepsis.  Brittany Montoya had paracentesis on admission with 300 mL of fluid was removed and sent for cytology. Cytology and blood cultures preliminary results show no growth. Brittany Montoya is currently on IV antibiotics. Initial labwork showed thrombocytopenia, leukocytosis and anemia.   Eliquis is currently on hold.   Today Brittany Montoya is feeling about the same as yesterday. Had an episode of nausea and vomiting this morning about 4:30am that quickly resolved. Has had two bowel movements today. Had an abdominal x-ray which showed non obstructive gas pattern. Was given antiemetics. Denies fevers. Abdominal pain is intermittent but has improved some.   Is scheduled for liver biopsy on Wednesday at [facility].   REVIEW OF SYSTEMS:   Review of Systems  Constitutional:  Positive for malaise/fatigue.   Gastrointestinal:  Positive for abdominal pain, nausea and vomiting.  Neurological:  Positive for weakness.   As per HPI. Otherwise, a complete review of systems is negative.  PAST MEDICAL HISTORY: Past Medical History:  Diagnosis Date   Anxiety    Arthritis    Ascites    Cirrhosis of liver ([place])    DDD (degenerative disc disease), cervical    Depression    Family history of breast cancer    GERD (gastroesophageal reflux disease)    Glaucoma    Headache    migraines   Hypertension    Ovarian cancer ([place]) 04/15/2021   PONV (postoperative nausea and vomiting)    Pulmonary embolism ([place])    Sleep apnea    Venous stasis     PAST SURGICAL HISTORY: Past Surgical History:  Procedure Laterality Date   ABDOMINAL HYSTERECTOMY N/A 2008   may have had left ovary removed   [place]     2012, 2015, 2020   COLONOSCOPY N/A 06/29/2021   Procedure: COLONOSCOPY;  Surgeon: Toledo, Benay Pike, MD;  Location: [facility] ENDOSCOPY;  Service: Gastroenterology;  Laterality: N/A;  DOCTORS ARE IN AGREEMENT THAT TOLEDO CAN DO THIS PROCEDURE IN RUSSO'S BLOCK   ESOPHAGOGASTRODUODENOSCOPY N/A 06/29/2021   Procedure: ESOPHAGOGASTRODUODENOSCOPY (EGD);  Surgeon: Toledo, Benay Pike, MD;  Location: [facility] ENDOSCOPY;  Service: Gastroenterology;  Laterality: N/A;   PARACENTESIS  04/20/2021   PILONIDAL CYST / SINUS EXCISION     PORTACATH PLACEMENT Right 06/01/2021   Procedure: INSERTION PORT-A-CATH;  Surgeon: Herbert Pun, MD;  Location: [facility] ORS;  Service: General;  Laterality: Right;  FAMILY HISTORY: Family History  Problem Relation Age of Onset   Breast cancer Mother 61   Breast cancer Maternal Aunt        d. under 10    ADVANCED DIRECTIVES (Y/N):  @ADVDIR @  HEALTH MAINTENANCE: Social History   Tobacco Use   Smoking status: Never   Smokeless tobacco: Never  Vaping Use   Vaping Use: Never used  Substance Use Topics   Alcohol  use: No   Drug use: No     Colonoscopy:  PAP:  Bone density:  Lipid panel:  Allergies  Allergen Reactions   Paclitaxel Shortness Of Breath and Other (See Comments)    pt flush, stated Brittany Montoya didn't feel well, taxol stopped (05/10/2021)   Amlodipine Other (See Comments) and Rash    Not effective Not effective    Penicillins Rash    Current Facility-Administered Medications  Medication Dose Route Frequency Provider Last Rate Last Admin   0.9 %  sodium chloride infusion   Intravenous Continuous Ivor Costa, MD 75 mL/hr at 09/02/21 0057 New Bag at 09/02/21 0057   acetaminophen (TYLENOL) tablet 650 mg  650 mg Oral Q6H PRN Ivor Costa, MD   650 mg at 08/31/21 2251   albuterol (PROVENTIL) (2.5 MG/3ML) 0.083% nebulizer solution 2.5 mg  2.5 mg Nebulization Q4H PRN Ivor Costa, MD       ceFEPIme (MAXIPIME) 2 g in sodium chloride 0.9 % 100 mL IVPB  2 g Intravenous Q8H Lockie Mola B, RPH 200 mL/hr at 09/02/21 0552 2 g at 09/02/21 2951   Chlorhexidine Gluconate Cloth 2 % PADS 6 each  6 each Topical Daily Ivor Costa, MD   6 each at 09/02/21 1020   clonazePAM (KLONOPIN) tablet 0.5 mg  0.5 mg Oral Daily PRN Ivor Costa, MD   0.5 mg at 08/31/21 2251   DULoxetine (CYMBALTA) DR capsule 30 mg  30 mg Oral Daily Ivor Costa, MD       famotidine (PEPCID) tablet 20 mg  20 mg Oral Daily Sharion Settler, NP   20 mg at 09/01/21 0843   feeding supplement (ENSURE ENLIVE / ENSURE PLUS) liquid 237 mL  237 mL Oral BID BM Wyvonnia Dusky, MD   237 mL at 09/02/21 1020   hydrALAZINE (APRESOLINE) injection 5 mg  5 mg Intravenous Q2H PRN Ivor Costa, MD       latanoprost (XALATAN) 0.005 % ophthalmic solution 1 drop  1 drop Both Eyes QHS Ivor Costa, MD   1 drop at 09/01/21 2150   lidocaine-prilocaine (EMLA) cream 1 application  1 application Topical Daily PRN Ivor Costa, MD       lip balm (BLISTEX) ointment   Topical PRN Renda Rolls, RPH       metroNIDAZOLE (FLAGYL) IVPB 500 mg  500 mg Intravenous Q12H Ivor Costa, MD  100 mL/hr at 09/02/21 1025 500 mg at 09/02/21 1025   morphine 2 MG/ML injection 2 mg  2 mg Intravenous Q4H PRN Ivor Costa, MD   2 mg at 09/02/21 1020   multivitamin with minerals tablet 1 tablet  1 tablet Oral Daily Ivor Costa, MD   1 tablet at 09/01/21 0843   ondansetron (ZOFRAN) injection 4 mg  4 mg Intravenous Q8H PRN Ivor Costa, MD   4 mg at 09/02/21 0957   polyethylene glycol (MIRALAX / GLYCOLAX) packet 17 g  17 g Oral Daily Faythe Casa E, NP   17 g at 09/02/21 1020   promethazine (PHENERGAN) 12.5 mg in sodium chloride 0.9 %  50 mL IVPB  12.5 mg Intravenous Q6H PRN Wyvonnia Dusky, MD 200 mL/hr at 09/02/21 0202 12.5 mg at 09/02/21 0202   scopolamine (TRANSDERM-SCOP) 1 MG/3DAYS 1.5 mg  1 patch Transdermal Q72H Wyvonnia Dusky, MD   1.5 mg at 09/02/21 3154   senna (SENOKOT) tablet 8.6 mg  1 tablet Oral Daily Faythe Casa E, NP   8.6 mg at 09/01/21 1434   timolol (TIMOPTIC) 0.5 % ophthalmic solution 1 drop  1 drop Both Eyes BID Ivor Costa, MD       traMADol Veatrice Bourbon) tablet 50 mg  50 mg Oral Q6H PRN Ivor Costa, MD   50 mg at 09/01/21 1659   triamcinolone cream (KENALOG) 0.1 % cream 1 application  1 application Topical Daily PRN Ivor Costa, MD       Facility-Administered Medications Ordered in Other Encounters  Medication Dose Route Frequency Provider Last Rate Last Admin   LORazepam (ATIVAN) injection 0.5 mg  0.5 mg Intravenous Once PRN Earlie Server, MD        OBJECTIVE: Vitals:   09/02/21 0813 09/02/21 1307  BP: 131/88 123/87  Pulse: (!) 107 (!) 110  Resp: 20 14  Temp: 98 F (36.7 C) 98.6 F (37 C)  SpO2: 100% 98%     Body mass index is 35.66 kg/m.    ECOG FS:1 - Symptomatic but completely ambulatory  Physical Exam Constitutional:      Appearance: Normal appearance.  HENT:     Head: Normocephalic and atraumatic.  Eyes:     Pupils: Pupils are equal, round, and reactive to light.  Cardiovascular:     Rate and Rhythm: Normal rate and regular rhythm.     Heart sounds:  Normal heart sounds. No murmur heard. Pulmonary:     Effort: Pulmonary effort is normal.     Breath sounds: Normal breath sounds. No wheezing.  Abdominal:     General: Bowel sounds are normal. There is no distension.     Palpations: Abdomen is soft.     Tenderness: There is abdominal tenderness in the left lower quadrant.  Musculoskeletal:        General: Normal range of motion.     Cervical back: Normal range of motion.  Skin:    General: Skin is warm and dry.     Findings: No rash.  Neurological:     Mental Status: Brittany Montoya is alert and oriented to person, place, and time.  Psychiatric:        Judgment: Judgment normal.    LAB RESULTS:  Lab Results  Component Value Date   NA 133 (L) 09/02/2021   K 3.8 09/02/2021   CL 101 09/02/2021   CO2 23 09/02/2021   GLUCOSE 105 (H) 09/02/2021   BUN 12 09/02/2021   CREATININE 0.85 09/02/2021   CALCIUM 9.0 09/02/2021   PROT 5.7 (L) 09/02/2021   ALBUMIN 2.5 (L) 09/02/2021   AST 24 09/02/2021   ALT 17 09/02/2021   ALKPHOS 147 (H) 09/02/2021   BILITOT 0.8 09/02/2021   GFRNONAA >60 09/02/2021   GFRAA >60 03/06/2015    Lab Results  Component Value Date   WBC 25.7 (H) 09/02/2021   NEUTROABS 22.9 (H) 09/02/2021   HGB 9.5 (L) 09/02/2021   HCT 27.9 (L) 09/02/2021   MCV 101.8 (H) 09/02/2021   PLT 24 (LL) 09/02/2021     STUDIES: DG Abd 1 View  Result Date: 09/02/2021 CLINICAL DATA:  60 year old female with history of nausea and vomiting. History of ovarian cancer.  EXAM: ABDOMEN - 1 VIEW COMPARISON:  No priors. FINDINGS: The bowel gas pattern is normal. No radio-opaque calculi or other significant radiographic abnormality are seen. Surgical clips project over the right upper quadrant of the abdomen, likely from prior cholecystectomy. IMPRESSION: 1. Nonobstructive bowel gas pattern. 2. No pneumoperitoneum. Electronically Signed   By: Vinnie Langton M.D.   On: 09/02/2021 06:06   CT ABDOMEN PELVIS W CONTRAST  Result Date:  08/31/2021 CLINICAL DATA:  Abdominal pain, history of ovarian cancer EXAM: CT ABDOMEN AND PELVIS WITH CONTRAST TECHNIQUE: Multidetector CT imaging of the abdomen and pelvis was performed using the standard protocol following bolus administration of intravenous contrast. CONTRAST:  131mL OMNIPAQUE IOHEXOL 300 MG/ML  SOLN COMPARISON:  CT abdomen and pelvis dated June 25, 2021 FINDINGS: Lower chest: No acute abnormality. Hepatobiliary: No focal liver abnormality is seen. Status post cholecystectomy. No biliary dilatation. In Pancreas: Unremarkable. No pancreatic ductal dilatation or surrounding inflammatory changes. Spleen: Normal in size without focal abnormality. Adrenals/Urinary Tract: Adrenal glands are unremarkable. Kidneys are normal, without renal calculi, focal lesion, or hydronephrosis. Bladder is unremarkable. Stomach/Bowel: Stomach is within normal limits. Appendix appears normal. No evidence of bowel wall thickening, distention, or inflammatory changes. Vascular/Lymphatic: Aortic atherosclerosis. No enlarged abdominal or pelvic lymph nodes. Reproductive: Uterus is surgically absent. Unchanged appearance of large cystic and solid mass of the right ovary, measures approximately 1.9 x 1.3 cm, when measured on coronal image, unchanged in size when remeasured in similar plane on prior exam. Other: Small fluid containing umbilical hernia. Similar peritoneal thickening and nodularity. Small to moderate volume abdominal ascites, slightly decreased when compared to the prior exam. Musculoskeletal: No acute or significant osseous findings. IMPRESSION: 1. No acute findings in the abdomen or pelvis. 2. Unchanged appearance of large cystic and solid mass of the right ovary. 3. Similar peritoneal thickening and nodularity. 4. Small to moderate volume abdominal ascites, slightly decreased when compared with prior exam. 5.  Aortic Atherosclerosis (ICD10-I70.0). Electronically Signed   By: Yetta Glassman M.D.   On:  08/31/2021 12:24   US Paracentesis  Result Date: 08/31/2021 INDICATION: Patient with history of ovarian cancer, recurrent ascites, new onset abdominal pain following paracentesis 08/29/2021. Request to IR for diagnostic and therapeutic paracentesis EXAM: ULTRASOUND GUIDED DIAGNOSTIC AND THERAPEUTIC PARACENTESIS MEDICATIONS: 8 mL 1% lidocaine COMPLICATIONS: None immediate. PROCEDURE: Informed written consent was obtained from the patient after a discussion of the risks, benefits and alternatives to treatment. A timeout was performed prior to the initiation of the procedure. Initial ultrasound scanning demonstrates a small amount of ascites within the right upper abdominal quadrant. The right upper abdomen was prepped and draped in the usual sterile fashion. 1% lidocaine was used for local anesthesia. Following this, a 19 gauge, 7-cm, Yueh catheter was introduced. An ultrasound image was saved for documentation purposes. The paracentesis was performed. The catheter was removed and a dressing was applied. The patient tolerated the procedure well without immediate post procedural complication. FINDINGS: A total of approximately 300 mL of clear, dark serosanguineous fluid was removed. Samples were sent to the laboratory as requested by the clinical team. IMPRESSION: Successful ultrasound-guided paracentesis yielding 300 milliliters of peritoneal fluid. Read by Candiss Norse, PA-C Electronically Signed   By: Miachel Roux M.D.   On: 08/31/2021 14:48   US Paracentesis  Result Date: 08/29/2021 INDICATION: Patient with history of ovarian cancer recurrent ascites request for paracentesis. EXAM: ULTRASOUND GUIDED  PARACENTESIS MEDICATIONS: Local 1% lidocaine only. COMPLICATIONS: None immediate. PROCEDURE: Informed written consent was obtained  from the patient after a discussion of the risks, benefits and alternatives to treatment. A timeout was performed prior to the initiation of the procedure. Initial ultrasound  scanning demonstrates a large amount of ascites within the left lower abdominal quadrant. The left lower abdomen was prepped and draped in the usual sterile fashion. 1% lidocaine was used for local anesthesia. Following this, a 19 gauge, 7-cm, Yueh catheter was introduced. An ultrasound image was saved for documentation purposes. The paracentesis was performed. The catheter was removed and a dressing was applied. The patient tolerated the procedure well without immediate post procedural complication. FINDINGS: A total of approximately 4 L of amber colored fluid was removed. IMPRESSION: Successful ultrasound-guided paracentesis yielding 4 liters of peritoneal fluid. This exam was performed by Tsosie Billing PA-C, and was supervised and interpreted by Dr. Vernard Gambles. Electronically Signed   By: Lucrezia Europe M.D.   On: 08/29/2021 16:23   US Paracentesis  Result Date: 08/22/2021 INDICATION: Patient with history of ovarian cancer and recurrent ascites request for paracentesis. EXAM: ULTRASOUND GUIDED  PARACENTESIS MEDICATIONS: Local 1% lidocaine only. COMPLICATIONS: None immediate. PROCEDURE: Informed written consent was obtained from the patient after a discussion of the risks, benefits and alternatives to treatment. A timeout was performed prior to the initiation of the procedure. Initial ultrasound scanning demonstrates a large amount of ascites within the right lower abdominal quadrant. The right lower abdomen was prepped and draped in the usual sterile fashion. 1% lidocaine was used for local anesthesia. Following this, a 19 gauge, 7-cm, Yueh catheter was introduced. An ultrasound image was saved for documentation purposes. The paracentesis was performed. The catheter was removed and a dressing was applied. The patient tolerated the procedure well without immediate post procedural complication. FINDINGS: A total of approximately 4 liters of amber colored fluid was removed. IMPRESSION: Successful ultrasound-guided  paracentesis yielding 4 liters of peritoneal fluid. This exam was performed by Tsosie Billing PA-C, and was supervised and interpreted by Dr. Pascal Lux. Electronically Signed   By: Sandi Mariscal M.D.   On: 08/22/2021 16:38   US Paracentesis  Result Date: 08/16/2021 INDICATION: 60 year old female with history of ovarian cancer with recurrent ascites presenting for paracentesis. EXAM: ULTRASOUND GUIDED  PARACENTESIS MEDICATIONS: None. COMPLICATIONS: None immediate. PROCEDURE: Informed written consent was obtained from the patient after a discussion of the risks, benefits and alternatives to treatment. A timeout was performed prior to the initiation of the procedure. Initial ultrasound scanning demonstrates a large amount of ascites within the right lower abdominal quadrant. The right lower abdomen was prepped and draped in the usual sterile fashion. 1% lidocaine was used for local anesthesia. Following this, a 6 Fr Safe-T-Centesis catheter was introduced. An ultrasound image was saved for documentation purposes. The paracentesis was performed. The catheter was removed and a dressing was applied. The patient tolerated the procedure well without immediate post procedural complication. FINDINGS: A total of approximately 4 of translucent, amber fluid was removed. IMPRESSION: Successful ultrasound-guided paracentesis yielding 4 liters of peritoneal fluid. Ruthann Cancer, MD [facility] Electronically Signed   By: Ruthann Cancer M.D.   On: 08/16/2021 16:25    ASSESSMENT: Brittany Montoya is a 60 year old female with past medical history of ovarian cancer who is currently admitted for spontaneous bacterial peritonitis meeting sepsis criteria.  PLAN:  Discussed case with Dr. Tasia Catchings.  Cytopenias likely secondary to chemotherapy and SBP is likely contributing. Recommend transfusing PRBC for hemoglobin less than 7 or significant symptoms, maintain a platelet count greater  than  20,000 and to hold Eliquis until plt greater then 50,000.  Cytology of peritoneal  fluid was negative-no growth. Blood cultures negative. Continue antibiotics. Hemoglobin has improved. Platelets are slowly recovering. White count is better today.   Constipation-Improved. Had two BM's today.    We will continue to follow her while in the hospital.Dr. Tasia Catchings will see her tomorrow.   Patient expressed understanding and was in agreement with this plan. Brittany Montoya also understands that Brittany Montoya can call clinic at any time with any questions, concerns, or complaints.    Cancer Staging  Ovarian cancer [facility]) Staging form: Ovary, Fallopian Tube, and Primary Peritoneal Carcinoma, AJCC 8th Edition - Clinical stage from 04/18/2021: FIGO Stage II, calculated as Stage Unknown (cT2, cNX, cM0) - Signed by Earlie Server, MD on 04/19/2021 Stage prefix: Initial diagnosis   Jacquelin Hawking, NP   09/02/2021 2:17 PM

## 2021-09-02 NOTE — Progress Notes (Signed)
PROGRESS NOTE    Brittany Montoya  BSJ:[id number] DOB: [birth year] DOA: 08/31/2021 PCP: Adin Hector, MD  Assessment & Plan:   Principal Problem:   Abdominal pain Active Problems:   Essential hypertension   Other cirrhosis of liver (HCC)   Ovarian cancer ([place])   Pulmonary embolus ([place])   Depression with anxiety   Normocytic anemia   Thrombocytopenia (HCC)   Severe sepsis (HCC)   Hyponatremia   SBP (spontaneous bacterial peritonitis) ([place])  Severe sepsis: met criteria w/ leukocytosis, tachycardia, tachypnea, elevated lactic acid and possibly due to SBP. S/p paracentesis which yielded 300 mL of dark serosanguineous fluid. Peritoneal fluid cx NGTD.  Continue on IV cefepime, flagyl. Blood cxs NGTD. Procal 0.12   Possible SBP: continue on IV cefepime, flagyl. Peritoneal fluid NGTD. Zofran, phenergan & scopolamine patch.   HTN: continue to hold home dose of HCTZ, losartan    Cirrhosis: not on any meds as per med rec. MELF score of 7.    Ovarian cancer: last chemo 08/22/21. Onco following and recs apprec   Pulmonary embolus:  dx approx 3 months ago. Hold home dose of eliquis as pt has severe thrombocytopenia & anemia.    Depression: severity unknown. Continue on home dose of duloxetine   Bicytopenia: w/ macrocytic anemia & severe thrombocytopenia. Hb and platelets are trending up today   Leukocytosis: likely secondary to infection. Continue on IV abxs   Hyponatremia: stable. Continue on IVFs   Obesity: BMI 35.6. Complicates prognosis    DVT prophylaxis: SCDs Code Status: full  Family Communication:  discussed pt's care w/ pt's husband, Barnabas Lister, and answered his questions  Disposition Plan: depends on OT/PT recs (not consulted yet)  Level of care: Med-Surg  Status is: Inpatient  Remains inpatient appropriate because: severity of illness    Consultants:  Onco   Procedures:  Antimicrobials: flagyl & cefepime    Subjective: Pt c/o intermittent nausea &  vomiting   Objective: Vitals:   09/01/21 1533 09/01/21 2019 09/02/21 0506 09/02/21 0813  BP: 111/72 115/69 121/86 131/88  Pulse: 98 (!) 103 96 (!) 107  Resp: '16 18  20  ' Temp: 98.2 F (36.8 C) 99.2 F (37.3 C) 98.2 F (36.8 C) 98 F (36.7 C)  TempSrc:  Oral Oral   SpO2: 100% 98% 98% 100%  Weight:      Height:        Intake/Output Summary (Last 24 hours) at 09/02/2021 1210 Last data filed at 09/02/2021 0900 Gross per 24 hour  Intake 2346.49 ml  Output 750 ml  Net 1596.49 ml   Filed Weights   08/31/21 2251  Weight: 85.6 kg    Examination:  General exam: Appears calm but uncomfortable  Respiratory system: decreased breath sounds b/l  Cardiovascular system: S1/S2+. No rubs or clicks Gastrointestinal system: Abd is soft, NT, obese & normal bowel sounds  Central nervous system: Alert and oriented. Moves all extremities  Psychiatry: Judgement and insight appear normal. Appropriate mood and affect      Data Reviewed: I have personally reviewed following labs and imaging studies  CBC: Recent Labs  Lab 08/30/21 1500 08/31/21 0904 09/01/21 0540 09/01/21 1300 09/01/21 1656 09/02/21 0546  WBC 17.8* 21.6* 20.5* 24.3* 27.1* 25.7*  NEUTROABS 15.6* 19.3*  --  21.5* 23.5* 22.9*  HGB 10.2* 9.9* 7.4* 8.0* 8.5* 9.5*  HCT 29.0* 28.9* 21.7* 23.3* 24.4* 27.9*  MCV 99.3 99.7 101.4* 101.7* 102.1* 101.8*  PLT 39* 31* 21* 21* 21* 24*   Basic Metabolic  Panel: Recent Labs  Lab 08/31/21 0904 08/31/21 1705 08/31/21 1912 09/01/21 0540 09/02/21 0546  NA 128* 130* 135 133* 133*  K 3.6 3.4* 3.8 3.9 3.8  CL 91* 97* 99 102 101  CO2 '24 25 29 25 23  ' GLUCOSE 169* 105* 99 85 105*  BUN '19 17 18 15 12  ' CREATININE 1.26* 1.03* 0.97 0.88 0.85  CALCIUM 9.0 8.3* 8.3* 8.0* 9.0   GFR: Estimated Creatinine Clearance: 69 mL/min (by C-G formula based on SCr of 0.85 mg/dL). Liver Function Tests: Recent Labs  Lab 08/30/21 1500 08/31/21 0904 09/02/21 0546  AST 36 37 24  ALT '25 25 17   ' ALKPHOS 217* 214* 147*  BILITOT 0.6 0.6 0.8  PROT 6.4* 6.4* 5.7*  ALBUMIN 2.6* 2.6* 2.5*   Recent Labs  Lab 08/31/21 1136  LIPASE 31   Recent Labs  Lab 08/31/21 1912  AMMONIA 31   Coagulation Profile: Recent Labs  Lab 08/31/21 1912  INR 1.1   Cardiac Enzymes: No results for input(s): CKTOTAL, CKMB, CKMBINDEX, TROPONINI in the last 168 hours. BNP (last 3 results) No results for input(s): PROBNP in the last 8760 hours. HbA1C: No results for input(s): HGBA1C in the last 72 hours. CBG: No results for input(s): GLUCAP in the last 168 hours. Lipid Profile: No results for input(s): CHOL, HDL, LDLCALC, TRIG, CHOLHDL, LDLDIRECT in the last 72 hours. Thyroid Function Tests: No results for input(s): TSH, T4TOTAL, FREET4, T3FREE, THYROIDAB in the last 72 hours. Anemia Panel: No results for input(s): VITAMINB12, FOLATE, FERRITIN, TIBC, IRON, RETICCTPCT in the last 72 hours. Sepsis Labs: Recent Labs  Lab 08/31/21 1136 08/31/21 1705  PROCALCITON  --  0.12  LATICACIDVEN 3.1*  --     Recent Results (from the past 240 hour(s))  Blood culture (routine x 2)     Status: None (Preliminary result)   Collection Time: 08/31/21 11:16 AM   Specimen: BLOOD LEFT ARM  Result Value Ref Range Status   Specimen Description BLOOD LEFT ARM  Final   Special Requests   Final    BOTTLES DRAWN AEROBIC AND ANAEROBIC Blood Culture adequate volume   Culture   Final    NO GROWTH 2 DAYS Performed at [facility], [address], [place], [place] [address]    Report Status PENDING  Incomplete  Blood culture (routine x 2)     Status: None (Preliminary result)   Collection Time: 08/31/21 11:37 AM   Specimen: BLOOD  Result Value Ref Range Status   Specimen Description BLOOD LEFT ANTECUBITAL  Final   Special Requests   Final    BOTTLES DRAWN AEROBIC AND ANAEROBIC Blood Culture adequate volume   Culture   Final    NO GROWTH 2 DAYS Performed at [facility], [address]  [place], [place] [address]    Report Status PENDING  Incomplete  Resp Panel by RT-PCR (Flu A&B, Covid) Nasopharyngeal Swab     Status: None   Collection Time: 08/31/21  1:03 PM   Specimen: Nasopharyngeal Swab; Nasopharyngeal(NP) swabs in vial transport medium  Result Value Ref Range Status   SARS Coronavirus 2 by RT PCR NEGATIVE NEGATIVE Final    Comment: (NOTE) SARS-CoV-2 target nucleic acids are NOT DETECTED.  The SARS-CoV-2 RNA is generally detectable in upper respiratory specimens during the acute phase of infection. The lowest concentration of SARS-CoV-2 viral copies this assay can detect is 138 copies/mL. A negative result does not preclude SARS-Cov-2 infection and should not be used as the  sole basis for treatment or other patient management decisions. A negative result may occur with  improper specimen collection/handling, submission of specimen other than nasopharyngeal swab, presence of viral mutation(s) within the areas targeted by this assay, and inadequate number of viral copies(<138 copies/mL). A negative result must be combined with clinical observations, patient history, and epidemiological information. The expected result is Negative.  Fact Sheet for Patients:  EntrepreneurPulse.com.au  Fact Sheet for Healthcare Providers:  IncredibleEmployment.be  This test is no t yet approved or cleared by the [place] FDA and  has been authorized for detection and/or diagnosis of SARS-CoV-2 by FDA under an Emergency Use Authorization (EUA). This EUA will remain  in effect (meaning this test can be used) for the duration of the COVID-19 declaration under Section 564(b)(1) of the Act, 21 U.S.C.section 360bbb-3(b)(1), unless the authorization is terminated  or revoked sooner.       Influenza A by PCR NEGATIVE NEGATIVE Final   Influenza B by PCR NEGATIVE NEGATIVE Final    Comment: (NOTE) The Xpert Xpress SARS-CoV-2/FLU/RSV plus assay is  intended as an aid in the diagnosis of influenza from Nasopharyngeal swab specimens and should not be used as a sole basis for treatment. Nasal washings and aspirates are unacceptable for Xpert Xpress SARS-CoV-2/FLU/RSV testing.  Fact Sheet for Patients: EntrepreneurPulse.com.au  Fact Sheet for Healthcare Providers: IncredibleEmployment.be  This test is not yet approved or cleared by the [place] FDA and has been authorized for detection and/or diagnosis of SARS-CoV-2 by FDA under an Emergency Use Authorization (EUA). This EUA will remain in effect (meaning this test can be used) for the duration of the COVID-19 declaration under Section 564(b)(1) of the Act, 21 U.S.C. section 360bbb-3(b)(1), unless the authorization is terminated or revoked.  Performed at [facility], [address] [place], [place] [address]   Body fluid culture w Gram Stain     Status: None (Preliminary result)   Collection Time: 08/31/21  1:24 PM   Specimen: Peritoneal Washings; Body Fluid  Result Value Ref Range Status   Specimen Description   Final    PERITONEAL Performed at [facility], [address] [place], [place] [address]    Special Requests   Final    NONE Performed at [facility], [address] [place], [place] [address]    Gram Stain   Final    NO SQUAMOUS EPITHELIAL CELLS SEEN FEW WBC SEEN NO ORGANISMS SEEN    Culture   Final    NO GROWTH 2 DAYS Performed at [facility], [place] [address] [place], [place] [address]    Report Status PENDING  Incomplete         Radiology Studies: DG Abd 1 View  Result Date: 09/02/2021 CLINICAL DATA:  60 year old female with history of nausea and vomiting. History of ovarian cancer. EXAM: ABDOMEN - 1 VIEW COMPARISON:  No priors. FINDINGS: The bowel gas pattern is normal. No radio-opaque calculi or other significant radiographic abnormality are seen. Surgical clips  project over the right upper quadrant of the abdomen, likely from prior cholecystectomy. IMPRESSION: 1. Nonobstructive bowel gas pattern. 2. No pneumoperitoneum. Electronically Signed   By: Vinnie Langton M.D.   On: 09/02/2021 06:06   CT ABDOMEN PELVIS W CONTRAST  Result Date: 08/31/2021 CLINICAL DATA:  Abdominal pain, history of ovarian cancer EXAM: CT ABDOMEN AND PELVIS WITH CONTRAST TECHNIQUE: Multidetector CT imaging of the abdomen and pelvis was performed using the standard protocol following bolus administration of intravenous  contrast. CONTRAST:  170m OMNIPAQUE IOHEXOL 300 MG/ML  SOLN COMPARISON:  CT abdomen and pelvis dated June 25, 2021 FINDINGS: Lower chest: No acute abnormality. Hepatobiliary: No focal liver abnormality is seen. Status post cholecystectomy. No biliary dilatation. In Pancreas: Unremarkable. No pancreatic ductal dilatation or surrounding inflammatory changes. Spleen: Normal in size without focal abnormality. Adrenals/Urinary Tract: Adrenal glands are unremarkable. Kidneys are normal, without renal calculi, focal lesion, or hydronephrosis. Bladder is unremarkable. Stomach/Bowel: Stomach is within normal limits. Appendix appears normal. No evidence of bowel wall thickening, distention, or inflammatory changes. Vascular/Lymphatic: Aortic atherosclerosis. No enlarged abdominal or pelvic lymph nodes. Reproductive: Uterus is surgically absent. Unchanged appearance of large cystic and solid mass of the right ovary, measures approximately 1.9 x 1.3 cm, when measured on coronal image, unchanged in size when remeasured in similar plane on prior exam. Other: Small fluid containing umbilical hernia. Similar peritoneal thickening and nodularity. Small to moderate volume abdominal ascites, slightly decreased when compared to the prior exam. Musculoskeletal: No acute or significant osseous findings. IMPRESSION: 1. No acute findings in the abdomen or pelvis. 2. Unchanged appearance of large  cystic and solid mass of the right ovary. 3. Similar peritoneal thickening and nodularity. 4. Small to moderate volume abdominal ascites, slightly decreased when compared with prior exam. 5.  Aortic Atherosclerosis (ICD10-I70.0). Electronically Signed   By: LYetta GlassmanM.D.   On: 08/31/2021 12:24   [place]acent[place]  Result Date: 08/31/2021 INDICATION: Patient with history of ovarian cancer, recurrent ascites, new onset abdominal pain following paracentesis 08/29/2021. Request to IR for diagnostic and therapeutic paracentesis EXAM: ULTRASOUND GUIDED DIAGNOSTIC AND THERAPEUTIC PARACENTESIS MEDICATIONS: 8 mL 1% lidocaine COMPLICATIONS: None immediate. PROCEDURE: Informed written consent was obtained from the patient after a discussion of the risks, benefits and alternatives to treatment. A timeout was performed prior to the initiation of the procedure. Initial ultrasound scanning demonstrates a small amount of ascites within the right upper abdominal quadrant. The right upper abdomen was prepped and draped in the usual sterile fashion. 1% lidocaine was used for local anesthesia. Following this, a 19 gauge, 7-cm, Yueh catheter was introduced. An ultrasound image was saved for documentation purposes. The paracentesis was performed. The catheter was removed and a dressing was applied. The patient tolerated the procedure well without immediate post procedural complication. FINDINGS: A total of approximately 300 mL of clear, dark serosanguineous fluid was removed. Samples were sent to the laboratory as requested by the clinical team. IMPRESSION: Successful ultrasound-guided paracentesis yielding 300 milliliters of peritoneal fluid. Read by SCandiss Norse PA-C Electronically Signed   By: FMiachel RouxM.D.   On: 08/31/2021 14:48        Scheduled Meds:  Chlorhexidine Gluconate Cloth  6 each Topical Daily   DULoxetine  30 mg Oral Daily   famotidine  20 mg Oral Daily   feeding supplement  237 mL Oral BID  BM   latanoprost  1 drop Both Eyes QHS   multivitamin with minerals  1 tablet Oral Daily   polyethylene glycol  17 g Oral Daily   scopolamine  1 patch Transdermal Q72H   senna  1 tablet Oral Daily   timolol  1 drop Both Eyes BID   Continuous Infusions:  sodium chloride 75 mL/hr at 09/02/21 0057   ceFEPime (MAXIPIME) IV 2 g (09/02/21 0552)   metronidazole 500 mg (09/02/21 1025)   promethazine (PHENERGAN) injection (IM or IVPB) 12.5 mg (09/02/21 0202)     LOS: 2 days    Time spent: 30  mins     Wyvonnia Dusky, MD [facility] Pager [phone]  If 7PM-7AM, please contact night-coverage 09/02/2021, 12:10 PM

## 2021-09-02 NOTE — Progress Notes (Signed)
Initial Nutrition Assessment  DOCUMENTATION CODES:   Obesity unspecified  INTERVENTION:   -Ensure Enlive po BID, each supplement provides 350 kcal and 20 grams of protein  NUTRITION DIAGNOSIS:   Increased nutrient needs related to cancer and cancer related treatments as evidenced by estimated needs.  GOAL:   Patient will meet greater than or equal to 90% of their needs  MONITOR:   PO intake, Supplement acceptance, Labs, Weight trends, I & O's  REASON FOR ASSESSMENT:   Malnutrition Screening Tool    ASSESSMENT:   60 y.o. female with medical history significant of ovarian cancer on chemotherapy, liver cirrhosis with ascites, hypertension, GERD, depression with anxiety, thrombocytopenia, PE on Eliquis, who presents with abdominal pain  12/16: s/p paracentesis, 300 ml yield  Patient currently consuming 25-100% of meals at this time. Has been struggling with nausea and abdominal pain. Pt getting full quickly during mealtimes. Have had a few episodes of emesis since admission. Followed by [place], last seen 12/2. Has had an overall decrease in appetite since cancer treatments started.  Was recommended Ensure Compact outpatient. Will order Ensure here during this admission.   Per weight records, pt's weights have been fluctuating, most likely related to fluid. Weighed  189 lbs on 9/22.  Per nursing documentation, pt with mild BLE edema.  Medications: Pepcid, Multivitamin with minerals daily, Miralax, Compazine, Senokot, Phenergan, Zofran  Labs reviewed: Low Na  NUTRITION - FOCUSED PHYSICAL EXAM:  Unable to complete, working remotely  Diet Order:   Diet Order             Diet regular Room service appropriate? Yes; Fluid consistency: Thin; Fluid restriction: 1200 mL Fluid  Diet effective now                   EDUCATION NEEDS:   No education needs have been identified at this time  Skin:  Skin Assessment: Reviewed RN Assessment  Last BM:   12/15  Height:   Ht Readings from Last 1 Encounters:  08/31/21 5\' 1"  (1.549 m)    Weight:   Wt Readings from Last 1 Encounters:  08/31/21 85.6 kg    BMI:  Body mass index is 35.66 kg/m.  Estimated Nutritional Needs:   Kcal:  2100-2300  Protein:  100-110g  Fluid:  2L/day  Clayton Bibles, MS, RD, LDN Inpatient Clinical Dietitian Contact information available via Amion

## 2021-09-02 NOTE — Progress Notes (Signed)
Patient with uncontrolled nausea all shift despite PRNs. One episode of emesis with 750cc bilious output. Pt states, "I just don't feel good. I feel a little better after throwing up, still nauseous." Abd remains distended, patient reports a bowel movement yesterday and confirms that it was loose/ribbon-like in nature. She had an episode of emesis yesterday afternoon as well and states, "it looked the same." Notified Hal Hope, MD - new orders for compazine x1 and abdominal xray.

## 2021-09-03 ENCOUNTER — Inpatient Hospital Stay: Payer: BC Managed Care – PPO

## 2021-09-03 ENCOUNTER — Ambulatory Visit: Admission: RE | Admit: 2021-09-03 | Payer: BC Managed Care – PPO | Source: Ambulatory Visit

## 2021-09-03 DIAGNOSIS — D72823 Leukemoid reaction: Secondary | ICD-10-CM

## 2021-09-03 DIAGNOSIS — R1084 Generalized abdominal pain: Secondary | ICD-10-CM

## 2021-09-03 DIAGNOSIS — D696 Thrombocytopenia, unspecified: Secondary | ICD-10-CM

## 2021-09-03 DIAGNOSIS — D649 Anemia, unspecified: Secondary | ICD-10-CM

## 2021-09-03 DIAGNOSIS — E871 Hypo-osmolality and hyponatremia: Secondary | ICD-10-CM

## 2021-09-03 DIAGNOSIS — R180 Malignant ascites: Secondary | ICD-10-CM

## 2021-09-03 DIAGNOSIS — K7469 Other cirrhosis of liver: Secondary | ICD-10-CM

## 2021-09-03 DIAGNOSIS — C569 Malignant neoplasm of unspecified ovary: Secondary | ICD-10-CM

## 2021-09-03 DIAGNOSIS — R112 Nausea with vomiting, unspecified: Secondary | ICD-10-CM

## 2021-09-03 DIAGNOSIS — I2699 Other pulmonary embolism without acute cor pulmonale: Secondary | ICD-10-CM

## 2021-09-03 DIAGNOSIS — C561 Malignant neoplasm of right ovary: Secondary | ICD-10-CM

## 2021-09-03 DIAGNOSIS — D72828 Other elevated white blood cell count: Secondary | ICD-10-CM

## 2021-09-03 LAB — COMPREHENSIVE METABOLIC PANEL
ALT: 17 U/L (ref 0–44)
AST: 24 U/L (ref 15–41)
Albumin: 2.4 g/dL — ABNORMAL LOW (ref 3.5–5.0)
Alkaline Phosphatase: 159 U/L — ABNORMAL HIGH (ref 38–126)
Anion gap: 7 (ref 5–15)
BUN: 15 mg/dL (ref 8–23)
CO2: 25 mmol/L (ref 22–32)
Calcium: 9.1 mg/dL (ref 8.9–10.3)
Chloride: 101 mmol/L (ref 98–111)
Creatinine, Ser: 1.01 mg/dL — ABNORMAL HIGH (ref 0.44–1.00)
GFR, Estimated: >60 mL/min (ref >60)
Glucose, Bld: 138 mg/dL — ABNORMAL HIGH (ref 70–99)
Potassium: 3.8 mmol/L (ref 3.5–5.1)
Sodium: 133 mmol/L — ABNORMAL LOW (ref 135–145)
Total Bilirubin: 0.6 mg/dL (ref 0.3–1.2)
Total Protein: 5.5 g/dL — ABNORMAL LOW (ref 6.5–8.1)

## 2021-09-03 LAB — CBC WITH DIFFERENTIAL/PLATELET
Abs Immature Granulocytes: 0.52 10*3/uL — ABNORMAL HIGH (ref 0.00–0.07)
Abs Immature Granulocytes: 1.07 10*3/uL — ABNORMAL HIGH (ref 0.00–0.07)
Basophils Absolute: 0 10*3/uL (ref 0.0–0.1)
Basophils Absolute: 0 10*3/uL (ref 0.0–0.1)
Basophils Relative: 0 %
Basophils Relative: 0 %
Eosinophils Absolute: 0 10*3/uL (ref 0.0–0.5)
Eosinophils Absolute: 0 10*3/uL (ref 0.0–0.5)
Eosinophils Relative: 0 %
Eosinophils Relative: 0 %
HCT: 26.5 % — ABNORMAL LOW (ref 36.0–46.0)
HCT: 26.8 % — ABNORMAL LOW (ref 36.0–46.0)
Hemoglobin: 9 g/dL — ABNORMAL LOW (ref 12.0–15.0)
Hemoglobin: 9.2 g/dL — ABNORMAL LOW (ref 12.0–15.0)
Immature Granulocytes: 2 %
Immature Granulocytes: 3 %
Lymphocytes Relative: 3 %
Lymphocytes Relative: 3 %
Lymphs Abs: 1.1 10*3/uL (ref 0.7–4.0)
Lymphs Abs: 1.3 10*3/uL (ref 0.7–4.0)
MCH: 34.1 pg — ABNORMAL HIGH (ref 26.0–34.0)
MCH: 35.1 pg — ABNORMAL HIGH (ref 26.0–34.0)
MCHC: 34 g/dL (ref 30.0–36.0)
MCHC: 34.3 g/dL (ref 30.0–36.0)
MCV: 100.4 fL — ABNORMAL HIGH (ref 80.0–100.0)
MCV: 102.3 fL — ABNORMAL HIGH (ref 80.0–100.0)
Monocytes Absolute: 1.3 10*3/uL — ABNORMAL HIGH (ref 0.1–1.0)
Monocytes Absolute: 1.5 10*3/uL — ABNORMAL HIGH (ref 0.1–1.0)
Monocytes Relative: 4 %
Monocytes Relative: 4 %
Neutro Abs: 30 10*3/uL — ABNORMAL HIGH (ref 1.7–7.7)
Neutro Abs: 35.6 10*3/uL — ABNORMAL HIGH (ref 1.7–7.7)
Neutrophils Relative %: 91 %
Neutrophils Relative %: 90 %
Platelets: 35 10*3/uL — ABNORMAL LOW (ref 150–400)
Platelets: 45 10*3/uL — ABNORMAL LOW (ref 150–400)
RBC: 2.64 MIL/uL — ABNORMAL LOW (ref 3.87–5.11)
RBC: 2.62 MIL/uL — ABNORMAL LOW (ref 3.87–5.11)
RDW: 17.3 % — ABNORMAL HIGH (ref 11.5–15.5)
RDW: 17.3 % — ABNORMAL HIGH (ref 11.5–15.5)
Smear Review: NORMAL
WBC: 32.9 10*3/uL — ABNORMAL HIGH (ref 4.0–10.5)
WBC: 39.5 10*3/uL — ABNORMAL HIGH (ref 4.0–10.5)
nRBC: 0.1 % (ref 0.0–0.2)
nRBC: 0.2 % (ref 0.0–0.2)

## 2021-09-03 MED ORDER — PANTOPRAZOLE SODIUM 40 MG PO TBEC: 40.0000 mg | DELAYED_RELEASE_TABLET | Freq: Two times a day (BID) | ORAL | Status: DC

## 2021-09-03 MED ORDER — PANTOPRAZOLE SODIUM 40 MG IV SOLR
40.0000 mg | Freq: Two times a day (BID) | INTRAVENOUS | Status: DC
  Administered 2021-09-03 – 2021-09-04 (×3): 40 mg via INTRAVENOUS
  Filled 2021-09-03 (×3): qty 40

## 2021-09-03 MED ORDER — METOCLOPRAMIDE HCL 5 MG/ML IJ SOLN
10.0000 mg | Freq: Three times a day (TID) | INTRAMUSCULAR | Status: DC
  Administered 2021-09-03 – 2021-09-04 (×4): 10 mg via INTRAVENOUS
  Filled 2021-09-03 (×5): qty 2

## 2021-09-03 MED ORDER — LORAZEPAM 2 MG/ML IJ SOLN
0.5000 mg | Freq: Once | INTRAMUSCULAR | Status: AC
  Administered 2021-09-03: 18:00:00 0.5 mg via INTRAVENOUS
  Filled 2021-09-03: qty 1

## 2021-09-03 MED ORDER — NYSTATIN 100000 UNIT/ML MT SUSP
5.0000 mL | Freq: Four times a day (QID) | OROMUCOSAL | Status: DC
  Administered 2021-09-03 – 2021-09-04 (×5): 500000 [IU] via ORAL
  Filled 2021-09-03 (×4): qty 5

## 2021-09-03 MED ORDER — CEFAZOLIN SODIUM-DEXTROSE 1-4 GM/50ML-% IV SOLN
1.0000 g | Freq: Three times a day (TID) | INTRAVENOUS | Status: DC
  Administered 2021-09-03 – 2021-09-04 (×3): 1 g via INTRAVENOUS
  Filled 2021-09-03 (×6): qty 50

## 2021-09-03 MED ORDER — PROCHLORPERAZINE EDISYLATE 10 MG/2ML IJ SOLN
10.0000 mg | Freq: Four times a day (QID) | INTRAMUSCULAR | Status: DC | PRN
  Filled 2021-09-03: qty 2

## 2021-09-03 MED ORDER — LORAZEPAM 2 MG/ML IJ SOLN
0.5000 mg | Freq: Two times a day (BID) | INTRAMUSCULAR | Status: DC
  Administered 2021-09-04: 11:00:00 0.5 mg via INTRAVENOUS
  Filled 2021-09-03: qty 1

## 2021-09-03 NOTE — Progress Notes (Signed)
Hematology/Oncology Progress Note [facility] [phone] [phone]  Patient Care Team: Adin Hector, MD as PCP - General (Internal Medicine) Earlie Server, MD as Consulting Physician (Hematology and Oncology) Clent Jacks, RN as Oncology Nurse Navigator   Name of the patient: Brittany Montoya  [id number]  1960-11-08  Date of visit: 09/03/21   INTERVAL HISTORY-  Patient is sitting in the bed.  She reports not feeling well today.  Continues to feel nausea and vomited twice today.  Not able to keep food down. Denies any bleeding events.  She is anxious and worries about her upcoming liver biopsy at [facility] Husband is at the bedside.    Allergies  Allergen Reactions   Paclitaxel Shortness Of Breath and Other (See Comments)    pt flush, stated she didn't feel well, taxol stopped (05/10/2021)   Amlodipine Other (See Comments) and Rash    Not effective Not effective    Penicillins Rash    Patient Active Problem List   Diagnosis Date Noted   Abdominal pain 08/31/2021   Normocytic anemia 08/31/2021   Thrombocytopenia ([place]) 08/31/2021   Severe sepsis ([place]) 08/31/2021   Hyponatremia 08/31/2021   SBP (spontaneous bacterial peritonitis) ([place]) 08/31/2021   Genetic testing 07/10/2021   Symptomatic anemia 07/05/2021   Chemotherapy induced neutropenia ([place]) 06/15/2021   Infusion reaction 05/10/2021   Family history of breast cancer 04/27/2021   Goals of care, counseling/discussion 04/19/2021   Pulmonary embolus ([place]) 04/19/2021   Encounter for antineoplastic chemotherapy 04/19/2021   Depression with anxiety 04/19/2021   Ovarian cancer ([place]) 04/15/2021   Malignant ascites 04/11/2021   Other cirrhosis of liver ([place]) 04/11/2021   DDD (degenerative disc disease), cervical 03/02/2019   Glaucoma (increased eye pressure) 03/02/2019   Venous stasis 03/02/2019   Facet arthritis of cervical region 04/01/2016   Incomplete tear of left rotator  cuff 01/15/2016   Rotator cuff tendinitis, left 01/15/2016   Essential hypertension 10/02/2015   Obstructive sleep apnea syndrome 10/02/2015   Recurrent major depressive disorder, in full remission ([place]) 10/02/2015   Contusion of left knee 10/17/2014     Past Medical History:  Diagnosis Date   Anxiety    Arthritis    Ascites    Cirrhosis of liver (HCC)    DDD (degenerative disc disease), cervical    Depression    Family history of breast cancer    GERD (gastroesophageal reflux disease)    Glaucoma    Headache    migraines   Hypertension    Ovarian cancer ([place]) 04/15/2021   PONV (postoperative nausea and vomiting)    Pulmonary embolism ([place])    Sleep apnea    Venous stasis      Past Surgical History:  Procedure Laterality Date   ABDOMINAL HYSTERECTOMY N/A 2008   may have had left ovary removed   [place]     2012, 2015, 2020   COLONOSCOPY N/A 06/29/2021   Procedure: COLONOSCOPY;  Surgeon: Toledo, Benay Pike, MD;  Location: [facility] ENDOSCOPY;  Service: Gastroenterology;  Laterality: N/A;  DOCTORS ARE IN AGREEMENT THAT TOLEDO CAN DO THIS PROCEDURE IN RUSSO'S BLOCK   ESOPHAGOGASTRODUODENOSCOPY N/A 06/29/2021   Procedure: ESOPHAGOGASTRODUODENOSCOPY (EGD);  Surgeon: Toledo, Benay Pike, MD;  Location: [facility] ENDOSCOPY;  Service: Gastroenterology;  Laterality: N/A;   PARACENTESIS  04/20/2021   PILONIDAL CYST / SINUS EXCISION     PORTACATH PLACEMENT Right 06/01/2021  Procedure: INSERTION PORT-A-CATH;  Surgeon: Herbert Pun, MD;  Location: [facility] ORS;  Service: General;  Laterality: Right;    Social History   Socioeconomic History   Marital status: Married    Spouse name: Barnabas Lister   Number of children: 2   Years of education: Not on file   Highest education level: Not on file  Occupational History   Not on file  Tobacco Use   Smoking status: Never   Smokeless tobacco: Never  Vaping Use   Vaping Use: Never  used  Substance and Sexual Activity   Alcohol use: No   Drug use: No   Sexual activity: Not Currently    Birth control/protection: Surgical  Other Topics Concern   Not on file  Social History Narrative   Not on file   Social Determinants of Health   Financial Resource Strain: Not on file  Food Insecurity: Not on file  Transportation Needs: Not on file  Physical Activity: Not on file  Stress: Not on file  Social Connections: Not on file  Intimate Partner Violence: Not on file     Family History  Problem Relation Age of Onset   Breast cancer Mother 91   Breast cancer Maternal Aunt        d. under 50     Current Facility-Administered Medications:    0.9 %  sodium chloride infusion, , Intravenous, Continuous, Ivor Costa, MD, Last Rate: 75 mL/hr at 09/03/21 0421, New Bag at 09/03/21 0421   acetaminophen (TYLENOL) tablet 650 mg, 650 mg, Oral, Q6H PRN, Ivor Costa, MD, 650 mg at 08/31/21 2251   albuterol (PROVENTIL) (2.5 MG/3ML) 0.083% nebulizer solution 2.5 mg, 2.5 mg, Nebulization, Q4H PRN, Ivor Costa, MD   ceFEPIme (MAXIPIME) 2 g in sodium chloride 0.9 % 100 mL IVPB, 2 g, Intravenous, Q8H, Benita Gutter, RPH, Last Rate: 200 mL/hr at 09/03/21 0426, 2 g at 09/03/21 0426   Chlorhexidine Gluconate Cloth 2 % PADS 6 each, 6 each, Topical, Daily, Ivor Costa, MD, 6 each at 09/03/21 1001   clonazePAM (KLONOPIN) tablet 0.5 mg, 0.5 mg, Oral, Daily PRN, Ivor Costa, MD, 0.5 mg at 08/31/21 2251   DULoxetine (CYMBALTA) DR capsule 30 mg, 30 mg, Oral, Daily, Ivor Costa, MD   famotidine (PEPCID) tablet 20 mg, 20 mg, Oral, Daily, Sharion Settler, NP, 20 mg at 09/01/21 0843   feeding supplement (ENSURE ENLIVE / ENSURE PLUS) liquid 237 mL, 237 mL, Oral, BID BM, Wyvonnia Dusky, MD, 237 mL at 09/03/21 1001   hydrALAZINE (APRESOLINE) injection 5 mg, 5 mg, Intravenous, Q2H PRN, Ivor Costa, MD   latanoprost (XALATAN) 0.005 % ophthalmic solution 1 drop, 1 drop, Both Eyes, QHS, Niu, Soledad Gerlach, MD, 1  drop at 09/02/21 2052   lidocaine-prilocaine (EMLA) cream 1 application, 1 application, Topical, Daily PRN, Ivor Costa, MD   lip balm (BLISTEX) ointment, , Topical, PRN, Renda Rolls, RPH   metroNIDAZOLE (FLAGYL) IVPB 500 mg, 500 mg, Intravenous, Q12H, Ivor Costa, MD, Last Rate: 100 mL/hr at 09/03/21 1002, 500 mg at 09/03/21 1002   morphine 2 MG/ML injection 2 mg, 2 mg, Intravenous, Q4H PRN, Ivor Costa, MD, 2 mg at 09/02/21 1805   multivitamin with minerals tablet 1 tablet, 1 tablet, Oral, Daily, Ivor Costa, MD, 1 tablet at 09/01/21 0843   nystatin (MYCOSTATIN) 100000 UNIT/ML suspension 500,000 Units, 5 mL, Oral, QID, Wyvonnia Dusky, MD   ondansetron [facility]) injection 4 mg, 4 mg, Intravenous, Q8H PRN, Ivor Costa, MD, 4 mg at 09/03/21 [id number]  pantoprazole (PROTONIX) injection 40 mg, 40 mg, Intravenous, Q12H, Wyvonnia Dusky, MD, 40 mg at 09/03/21 0956   polyethylene glycol (MIRALAX / GLYCOLAX) packet 17 g, 17 g, Oral, Daily, Burns, Jennifer E, NP, 17 g at 09/03/21 0958   prochlorperazine (COMPAZINE) injection 10 mg, 10 mg, Intravenous, Q6H PRN, Wyvonnia Dusky, MD   promethazine (PHENERGAN) 12.5 mg in sodium chloride 0.9 % 50 mL IVPB, 12.5 mg, Intravenous, Q6H PRN, Wyvonnia Dusky, MD, Last Rate: 200 mL/hr at 09/02/21 0202, 12.5 mg at 09/02/21 0202   scopolamine (TRANSDERM-SCOP) 1 MG/3DAYS 1.5 mg, 1 patch, Transdermal, Q72H, Wyvonnia Dusky, MD, 1.5 mg at 09/02/21 0630   senna (SENOKOT) tablet 8.6 mg, 1 tablet, Oral, Daily, Faythe Casa E, NP, 8.6 mg at 09/01/21 1434   timolol (TIMOPTIC) 0.5 % ophthalmic solution 1 drop, 1 drop, Both Eyes, BID, Ivor Costa, MD   traMADol (ULTRAM) tablet 50 mg, 50 mg, Oral, Q6H PRN, Ivor Costa, MD, 50 mg at 09/01/21 1659   triamcinolone cream (KENALOG) 0.1 % cream 1 application, 1 application, Topical, Daily PRN, Ivor Costa, MD  Facility-Administered Medications Ordered in Other Encounters:    LORazepam (ATIVAN) injection 0.5 mg, 0.5 mg,  Intravenous, Once PRN, Earlie Server, MD   Physical exam:  Vitals:   09/02/21 2034 09/03/21 0634 09/03/21 0752 09/03/21 1154  BP: 108/76 110/88 120/83 114/81  Pulse: 98 94 (!) 102 (!) 109  Resp: 16 16 17 16   Temp: 98.2 F (36.8 C) 98.9 F (37.2 C) 98.6 F (37 C) 99 F (37.2 C)  TempSrc:  Oral Oral Oral  SpO2: 99% 99% 99% 98%  Weight:      Height:       Physical Exam Constitutional:      General: She is not in acute distress.    Appearance: She is obese. She is not diaphoretic.  HENT:     Head: Normocephalic and atraumatic.     Nose: Nose normal.     Mouth/Throat:     Pharynx: No oropharyngeal exudate.  Eyes:     General: No scleral icterus.    Pupils: Pupils are equal, round, and reactive to light.  Cardiovascular:     Rate and Rhythm: Normal rate and regular rhythm.     Heart sounds: No murmur heard. Pulmonary:     Effort: Pulmonary effort is normal. No respiratory distress.     Breath sounds: Normal breath sounds.  Abdominal:     Palpations: Abdomen is soft.     Tenderness: There is no abdominal tenderness.  Musculoskeletal:        General: Normal range of motion.     Cervical back: Normal range of motion and neck supple.  Skin:    General: Skin is warm and dry.     Findings: No erythema.  Neurological:     Mental Status: She is alert and oriented to person, place, and time.     Cranial Nerves: No cranial nerve deficit.     Motor: No abnormal muscle tone.     Coordination: Coordination normal.  Psychiatric:        Mood and Affect: Affect normal.     Comments: anxious       CMP Latest Ref Rng & Units 09/03/2021  Glucose 70 - 99 mg/dL 138(H)  BUN 8 - 23 mg/dL 15  Creatinine 0.44 - 1.00 mg/dL 1.01(H)  Sodium 135 - 145 mmol/L 133(L)  Potassium 3.5 - 5.1 mmol/L 3.8  Chloride 98 - 111 mmol/L 101  CO2 22 - 32 mmol/L 25  Calcium 8.9 - 10.3 mg/dL 9.1  Total Protein 6.5 - 8.1 g/dL 5.5(L)  Total Bilirubin 0.3 - 1.2 mg/dL 0.6  Alkaline Phos 38 - 126 U/L 159(H)   AST 15 - 41 U/L 24  ALT 0 - 44 U/L 17   CBC Latest Ref Rng & Units 09/03/2021  WBC 4.0 - 10.5 K/uL 32.9(H)  Hemoglobin 12.0 - 15.0 g/dL 9.0(L)  Hematocrit 36.0 - 46.0 % 26.5(L)  Platelets 150 - 400 K/uL 35(L)    RADIOGRAPHIC STUDIES: I have personally reviewed the radiological images as listed and agreed with the findings in the report. DG Abd 1 View  Result Date: 09/02/2021 CLINICAL DATA:  60 year old female with history of nausea and vomiting. History of ovarian cancer. EXAM: ABDOMEN - 1 VIEW COMPARISON:  No priors. FINDINGS: The bowel gas pattern is normal. No radio-opaque calculi or other significant radiographic abnormality are seen. Surgical clips project over the right upper quadrant of the abdomen, likely from prior cholecystectomy. IMPRESSION: 1. Nonobstructive bowel gas pattern. 2. No pneumoperitoneum. Electronically Signed   By: Vinnie Langton M.D.   On: 09/02/2021 06:06   CT ABDOMEN PELVIS W CONTRAST  Result Date: 08/31/2021 CLINICAL DATA:  Abdominal pain, history of ovarian cancer EXAM: CT ABDOMEN AND PELVIS WITH CONTRAST TECHNIQUE: Multidetector CT imaging of the abdomen and pelvis was performed using the standard protocol following bolus administration of intravenous contrast. CONTRAST:  111mL OMNIPAQUE IOHEXOL 300 MG/ML  SOLN COMPARISON:  CT abdomen and pelvis dated June 25, 2021 FINDINGS: Lower chest: No acute abnormality. Hepatobiliary: No focal liver abnormality is seen. Status post cholecystectomy. No biliary dilatation. In Pancreas: Unremarkable. No pancreatic ductal dilatation or surrounding inflammatory changes. Spleen: Normal in size without focal abnormality. Adrenals/Urinary Tract: Adrenal glands are unremarkable. Kidneys are normal, without renal calculi, focal lesion, or hydronephrosis. Bladder is unremarkable. Stomach/Bowel: Stomach is within normal limits. Appendix appears normal. No evidence of bowel wall thickening, distention, or inflammatory changes.  Vascular/Lymphatic: Aortic atherosclerosis. No enlarged abdominal or pelvic lymph nodes. Reproductive: Uterus is surgically absent. Unchanged appearance of large cystic and solid mass of the right ovary, measures approximately 1.9 x 1.3 cm, when measured on coronal image, unchanged in size when remeasured in similar plane on prior exam. Other: Small fluid containing umbilical hernia. Similar peritoneal thickening and nodularity. Small to moderate volume abdominal ascites, slightly decreased when compared to the prior exam. Musculoskeletal: No acute or significant osseous findings. IMPRESSION: 1. No acute findings in the abdomen or pelvis. 2. Unchanged appearance of large cystic and solid mass of the right ovary. 3. Similar peritoneal thickening and nodularity. 4. Small to moderate volume abdominal ascites, slightly decreased when compared with prior exam. 5.  Aortic Atherosclerosis (ICD10-I70.0). Electronically Signed   By: Yetta Glassman M.D.   On: 08/31/2021 12:24   US Paracentesis  Result Date: 08/31/2021 INDICATION: Patient with history of ovarian cancer, recurrent ascites, new onset abdominal pain following paracentesis 08/29/2021. Request to IR for diagnostic and therapeutic paracentesis EXAM: ULTRASOUND GUIDED DIAGNOSTIC AND THERAPEUTIC PARACENTESIS MEDICATIONS: 8 mL 1% lidocaine COMPLICATIONS: None immediate. PROCEDURE: Informed written consent was obtained from the patient after a discussion of the risks, benefits and alternatives to treatment. A timeout was performed prior to the initiation of the procedure. Initial ultrasound scanning demonstrates a small amount of ascites within the right upper abdominal quadrant. The right upper abdomen was prepped and draped in the usual sterile fashion. 1% lidocaine was used for local anesthesia. Following this, a  19 gauge, 7-cm, Yueh catheter was introduced. An ultrasound image was saved for documentation purposes. The paracentesis was performed. The catheter  was removed and a dressing was applied. The patient tolerated the procedure well without immediate post procedural complication. FINDINGS: A total of approximately 300 mL of clear, dark serosanguineous fluid was removed. Samples were sent to the laboratory as requested by the clinical team. IMPRESSION: Successful ultrasound-guided paracentesis yielding 300 milliliters of peritoneal fluid. Read by Candiss Norse, PA-C Electronically Signed   By: Miachel Roux M.D.   On: 08/31/2021 14:48   US Paracentesis  Result Date: 08/29/2021 INDICATION: Patient with history of ovarian cancer recurrent ascites request for paracentesis. EXAM: ULTRASOUND GUIDED  PARACENTESIS MEDICATIONS: Local 1% lidocaine only. COMPLICATIONS: None immediate. PROCEDURE: Informed written consent was obtained from the patient after a discussion of the risks, benefits and alternatives to treatment. A timeout was performed prior to the initiation of the procedure. Initial ultrasound scanning demonstrates a large amount of ascites within the left lower abdominal quadrant. The left lower abdomen was prepped and draped in the usual sterile fashion. 1% lidocaine was used for local anesthesia. Following this, a 19 gauge, 7-cm, Yueh catheter was introduced. An ultrasound image was saved for documentation purposes. The paracentesis was performed. The catheter was removed and a dressing was applied. The patient tolerated the procedure well without immediate post procedural complication. FINDINGS: A total of approximately 4 L of amber colored fluid was removed. IMPRESSION: Successful ultrasound-guided paracentesis yielding 4 liters of peritoneal fluid. This exam was performed by Tsosie Billing PA-C, and was supervised and interpreted by Dr. Vernard Gambles. Electronically Signed   By: Lucrezia Europe M.D.   On: 08/29/2021 16:23   US Paracentesis  Result Date: 08/22/2021 INDICATION: Patient with history of ovarian cancer and recurrent ascites request for  paracentesis. EXAM: ULTRASOUND GUIDED  PARACENTESIS MEDICATIONS: Local 1% lidocaine only. COMPLICATIONS: None immediate. PROCEDURE: Informed written consent was obtained from the patient after a discussion of the risks, benefits and alternatives to treatment. A timeout was performed prior to the initiation of the procedure. Initial ultrasound scanning demonstrates a large amount of ascites within the right lower abdominal quadrant. The right lower abdomen was prepped and draped in the usual sterile fashion. 1% lidocaine was used for local anesthesia. Following this, a 19 gauge, 7-cm, Yueh catheter was introduced. An ultrasound image was saved for documentation purposes. The paracentesis was performed. The catheter was removed and a dressing was applied. The patient tolerated the procedure well without immediate post procedural complication. FINDINGS: A total of approximately 4 liters of amber colored fluid was removed. IMPRESSION: Successful ultrasound-guided paracentesis yielding 4 liters of peritoneal fluid. This exam was performed by Tsosie Billing PA-C, and was supervised and interpreted by Dr. Pascal Lux. Electronically Signed   By: Sandi Mariscal M.D.   On: 08/22/2021 16:38   US Paracentesis  Result Date: 08/16/2021 INDICATION: 60 year old female with history of ovarian cancer with recurrent ascites presenting for paracentesis. EXAM: ULTRASOUND GUIDED  PARACENTESIS MEDICATIONS: None. COMPLICATIONS: None immediate. PROCEDURE: Informed written consent was obtained from the patient after a discussion of the risks, benefits and alternatives to treatment. A timeout was performed prior to the initiation of the procedure. Initial ultrasound scanning demonstrates a large amount of ascites within the right lower abdominal quadrant. The right lower abdomen was prepped and draped in the usual sterile fashion. 1% lidocaine was used for local anesthesia. Following this, a 6 Fr Safe-T-Centesis catheter was introduced. An  ultrasound image was saved for documentation  purposes. The paracentesis was performed. The catheter was removed and a dressing was applied. The patient tolerated the procedure well without immediate post procedural complication. FINDINGS: A total of approximately 4 of translucent, amber fluid was removed. IMPRESSION: Successful ultrasound-guided paracentesis yielding 4 liters of peritoneal fluid. Ruthann Cancer, MD [facility] Electronically Signed   By: Ruthann Cancer M.D.   On: 08/16/2021 16:25    Assessment and plan-   #Intractable nausea vomiting, Not improving with current antiemetic regimen.  CT and KUB showed no obstruction. Recommend to add IV Reglan 10 mg every 8 hours as needed. Adding Ativan 0.5 mg IV twice daily  #locally advanced ovarian cancer, on neoadjuvant chemotherapy.  Status post 6 cycles of chemotherapy with G-CSF support.  Last chemotherapy was 08/17/2021. CT abdomen pelvis shows stable size of ovarian mass.  CA125 has responded very well. Pending surgical evaluation for debulking surgery.  #Anemia, chemotherapy-induced.  Counts are improving. #Thrombocytopenia, chemotherapy-induced.  Blood counts are improving. #History of bilateral pulmonary embolism, previously on Eliquis which is currently held due to thrombocytopenia.  Recommend to resume Eliquis once platelet count is above 50,000.  #Leukocytosis, likely secondary to G-CSF use.  Rule out infectious etiology.  ID has been consulted.  Appreciate recommendation.  #Abdominal pain after US guided paracentesis,  PMN >250, ? Spontaneous bacterial peritonitis vs reactive due to known history of malignant ascites.  Fluid analysis gram stain negative, no growth for 3 days, not supporting SBP diagnosis.  She has received empiric antibiotics.   #Liver cirrhosis, patient is scheduled to have liver biopsy at [facility] this week.  I have communicated with Mariea Clonts who will  notify [facility] provider about patient's current admission.   Thank you for allowing me to participate in the care of this patient.   Earlie Server, MD, PhD 09/03/2021

## 2021-09-03 NOTE — Progress Notes (Signed)
Nutrition Follow-up  DOCUMENTATION CODES:   Obesity unspecified  INTERVENTION:   -Continue Ensure Enlive po BID, each supplement provides 350 kcal and 20 grams of protein  -MVI with minerals daily  NUTRITION DIAGNOSIS:   Increased nutrient needs related to cancer and cancer related treatments as evidenced by estimated needs.  Ongoing  GOAL:   Patient will meet greater than or equal to 90% of their needs  Progressing   MONITOR:   PO intake, Supplement acceptance, Labs, Weight trends, I & O's  REASON FOR ASSESSMENT:   Malnutrition Screening Tool    ASSESSMENT:   60 y.o. female with medical history significant of ovarian cancer on chemotherapy, liver cirrhosis with ascites, hypertension, GERD, depression with anxiety, thrombocytopenia, PE on Eliquis, who presents with abdominal pain  12/16- s/p rt paracentesis (300 ml removed)  Reviewed I/O's: +1.4 L x 24 hours and +4.8 L since admission  Emesis: 950 ml x 24 hours  Pt sleeping soundly in recliner chair at time of visit.   Pt with very poor appetite today secondary to nausea and vomiting. Prior to today, pt consumed 25-100% of meals. She is accepting Ensure supplements.    Medications reviewed and include miralax and 0.9% sodium chloride infusion @ 75 ml/hr.   Labs reviewed: Na: 133.    NUTRITION - FOCUSED PHYSICAL EXAM:  Flowsheet Row Most Recent Value  Orbital Region No depletion  Upper Arm Region No depletion  Thoracic and Lumbar Region No depletion  Buccal Region No depletion  Temple Region No depletion  Clavicle Bone Region No depletion  Clavicle and Acromion Bone Region No depletion  Scapular Bone Region No depletion  Dorsal Hand No depletion  Patellar Region No depletion  Anterior Thigh Region No depletion  Posterior Calf Region No depletion  Edema (RD Assessment) Mild  Hair Reviewed  Eyes Reviewed  Mouth Reviewed  Skin Reviewed  Nails Reviewed       Diet Order:   Diet Order              Diet regular Room service appropriate? Yes; Fluid consistency: Thin; Fluid restriction: 1200 mL Fluid  Diet effective now                   EDUCATION NEEDS:   No education needs have been identified at this time  Skin:  Skin Assessment: Reviewed RN Assessment  Last BM:  09/02/21  Height:   Ht Readings from Last 1 Encounters:  08/31/21 5\' 1"  (1.549 m)    Weight:   Wt Readings from Last 1 Encounters:  08/31/21 85.6 kg    Ideal Body Weight:  47.7 kg  BMI:  Body mass index is 35.66 kg/m.  Estimated Nutritional Needs:   Kcal:  1900-2100  Protein:  105-120 grams  Fluid:  > 1.9 L    Loistine Chance, RD, LDN, [place] Registered Dietitian II Certified Diabetes Care and Education Specialist Please refer to [facility] for RD and/or RD on-call/weekend/after hours pager

## 2021-09-03 NOTE — Progress Notes (Signed)
PT Cancellation Note  Patient Details Name: Brittany Montoya MRN: [id number] DOB: Mar 24, 1961   Cancelled Treatment:    Reason Eval/Treat Not Completed: Other (comment). Order received. Per chart review and after speaking to RN - pt has been nauseous and vomiting today (worse than yesterday). Pt was siting in recliner with family in room upon arrival. She politely declines, asking PT to return tomorrow as she is not feeling well today. PT will follow up at later date and attempt evaluation as pt is appropriate and willing.    Patrina Levering PT, DPT 09/03/21 3:13 PM [phone]

## 2021-09-03 NOTE — Progress Notes (Signed)
PROGRESS NOTE    Brittany Montoya  GBT:[id number] DOB: [birth year] DOA: 08/31/2021 PCP: Adin Hector, MD  Assessment & Plan:   Principal Problem:   Abdominal pain Active Problems:   Essential hypertension   Other cirrhosis of liver (HCC)   Ovarian cancer ([place])   Pulmonary embolus ([place])   Depression with anxiety   Normocytic anemia   Thrombocytopenia (HCC)   Severe sepsis (HCC)   Hyponatremia   SBP (spontaneous bacterial peritonitis) ([place])  Severe sepsis: met criteria w/ leukocytosis, tachycardia, tachypnea, elevated lactic acid and possibly due to SBP. S/p paracentesis which yielded 300 mL of dark serosanguineous fluid. Peritoneal fluid cx NGTD.  Continue on IV cefepime, flagyl. Blood cxs NGTD. Procal 0.12. Severe sepsis resolved   Possible SBP: continue on IV flagyl, cefepime. Peritoneal fluid NGTD. Zofran, phenergan, compazine prn for nausea/vomtiing. ID consulted   HTN: continue to hold home dose of losartan, HCTZ as BP is low normal    Cirrhosis: not on any meds as per med rec. MELF score of 7. Not secondary to alcohol use    Ovarian cancer: last chemo at the beginning of this month. Onco following and recs apprec    Pulmonary embolus:  dx approx 3 months ago. Hold home dose of eliquis secondary to severe thrombocytopenia    Depression: severity unknown. Continue on home dose of duloxetine   Bicytopenia: w/ macrocytic anemia & severe thrombocytopenia. B12 & folate are not low. Hb and platelets are trending up today   Leukocytosis: continues to rise. Afebrile. Continue on IV abxs. ID consulted    Hyponatremia: stable. Will continue to monitor   Obesity: BMI 35.6. Complicates prognosis     DVT prophylaxis: SCDs Code Status: full  Family Communication:  discussed pt's care w/ pt's family and answered their questions  Disposition Plan: depends on OT/PT recs   Level of care: Med-Surg  Status is: Inpatient  Remains inpatient appropriate because: severity  of illness    Consultants:  Onco   Procedures:  Antimicrobials: flagyl & cefepime    Subjective: Pt still c/o nausea & vomiting   Objective: Vitals:   09/02/21 1307 09/02/21 1547 09/02/21 2034 09/03/21 0634  BP: 123/87 121/79 108/76 110/88  Pulse: (!) 110 (!) 109 98 94  Resp: '14 14 16 16  ' Temp: 98.6 F (37 C) 98.7 F (37.1 C) 98.2 F (36.8 C) 98.9 F (37.2 C)  TempSrc: Oral Oral  Oral  SpO2: 98% 99% 99% 99%  Weight:      Height:        Intake/Output Summary (Last 24 hours) at 09/03/2021 0725 Last data filed at 09/03/2021 0420 Gross per 24 hour  Intake 2373.16 ml  Output 950 ml  Net 1423.16 ml   Filed Weights   08/31/21 2251  Weight: 85.6 kg    Examination:  General exam: Appears calm but uncomfortable  Respiratory system: diminished breath sounds b/l  Cardiovascular system: S1 & S2+. No rubs or clicks  Gastrointestinal system: Abd is soft, NT, obese & normal bowel sounds  Central nervous system: Alert and oriented. Moves all extremities  Psychiatry: judgement and insight appear normal. Flat mood and affect    Data Reviewed: I have personally reviewed following labs and imaging studies  CBC: Recent Labs  Lab 09/01/21 1300 09/01/21 1656 09/02/21 0546 09/02/21 1755 09/03/21 0435  WBC 24.3* 27.1* 25.7* 35.3* 32.9*  NEUTROABS 21.5* 23.5* 22.9* 31.8* 30.0*  HGB 8.0* 8.5* 9.5* 8.8* 9.0*  HCT 23.3* 24.4* 27.9* 25.5* 26.5*  MCV 101.7* 102.1* 101.8* 101.6* 100.4*  PLT 21* 21* 24* 28* 35*   Basic Metabolic Panel: Recent Labs  Lab 08/31/21 1705 08/31/21 1912 09/01/21 0540 09/02/21 0546 09/03/21 0435  NA 130* 135 133* 133* 133*  K 3.4* 3.8 3.9 3.8 3.8  CL 97* 99 102 101 101  CO2 '25 29 25 23 25  ' GLUCOSE 105* 99 85 105* 138*  BUN '17 18 15 12 15  ' CREATININE 1.03* 0.97 0.88 0.85 1.01*  CALCIUM 8.3* 8.3* 8.0* 9.0 9.1   GFR: Estimated Creatinine Clearance: 58.1 mL/min (A) (by C-G formula based on SCr of 1.01 mg/dL (H)). Liver Function  Tests: Recent Labs  Lab 08/30/21 1500 08/31/21 0904 09/02/21 0546 09/03/21 0435  AST 36 37 24 24  ALT '25 25 17 17  ' ALKPHOS 217* 214* 147* 159*  BILITOT 0.6 0.6 0.8 0.6  PROT 6.4* 6.4* 5.7* 5.5*  ALBUMIN 2.6* 2.6* 2.5* 2.4*   Recent Labs  Lab 08/31/21 1136  LIPASE 31   Recent Labs  Lab 08/31/21 1912  AMMONIA 31   Coagulation Profile: Recent Labs  Lab 08/31/21 1912  INR 1.1   Cardiac Enzymes: No results for input(s): CKTOTAL, CKMB, CKMBINDEX, TROPONINI in the last 168 hours. BNP (last 3 results) No results for input(s): PROBNP in the last 8760 hours. HbA1C: No results for input(s): HGBA1C in the last 72 hours. CBG: No results for input(s): GLUCAP in the last 168 hours. Lipid Profile: No results for input(s): CHOL, HDL, LDLCALC, TRIG, CHOLHDL, LDLDIRECT in the last 72 hours. Thyroid Function Tests: No results for input(s): TSH, T4TOTAL, FREET4, T3FREE, THYROIDAB in the last 72 hours. Anemia Panel: No results for input(s): VITAMINB12, FOLATE, FERRITIN, TIBC, IRON, RETICCTPCT in the last 72 hours. Sepsis Labs: Recent Labs  Lab 08/31/21 1136 08/31/21 1705  PROCALCITON  --  0.12  LATICACIDVEN 3.1*  --     Recent Results (from the past 240 hour(s))  Blood culture (routine x 2)     Status: None (Preliminary result)   Collection Time: 08/31/21 11:16 AM   Specimen: BLOOD LEFT ARM  Result Value Ref Range Status   Specimen Description BLOOD LEFT ARM  Final   Special Requests   Final    BOTTLES DRAWN AEROBIC AND ANAEROBIC Blood Culture adequate volume   Culture   Final    NO GROWTH 3 DAYS Performed at [facility], [address] [place], [place] [address]    Report Status PENDING  Incomplete  Blood culture (routine x 2)     Status: None (Preliminary result)   Collection Time: 08/31/21 11:37 AM   Specimen: BLOOD  Result Value Ref Range Status   Specimen Description BLOOD LEFT ANTECUBITAL  Final   Special Requests   Final    BOTTLES DRAWN AEROBIC  AND ANAEROBIC Blood Culture adequate volume   Culture   Final    NO GROWTH 3 DAYS Performed at [facility], [address] [place], [place] [address]    Report Status PENDING  Incomplete  Resp Panel by RT-PCR (Flu A&B, Covid) Nasopharyngeal Swab     Status: None   Collection Time: 08/31/21  1:03 PM   Specimen: Nasopharyngeal Swab; Nasopharyngeal(NP) swabs in vial transport medium  Result Value Ref Range Status   SARS Coronavirus 2 by RT PCR NEGATIVE NEGATIVE Final    Comment: (NOTE) SARS-CoV-2 target nucleic acids are NOT DETECTED.  The SARS-CoV-2 RNA is generally detectable in upper respiratory specimens during the acute phase of infection. The lowest  concentration of SARS-CoV-2 viral copies this assay can detect is 138 copies/mL. A negative result does not preclude SARS-Cov-2 infection and should not be used as the sole basis for treatment or other patient management decisions. A negative result may occur with  improper specimen collection/handling, submission of specimen other than nasopharyngeal swab, presence of viral mutation(s) within the areas targeted by this assay, and inadequate number of viral copies(<138 copies/mL). A negative result must be combined with clinical observations, patient history, and epidemiological information. The expected result is Negative.  Fact Sheet for Patients:  EntrepreneurPulse.com.au  Fact Sheet for Healthcare Providers:  IncredibleEmployment.be  This test is no t yet approved or cleared by the [place] FDA and  has been authorized for detection and/or diagnosis of SARS-CoV-2 by FDA under an Emergency Use Authorization (EUA). This EUA will remain  in effect (meaning this test can be used) for the duration of the COVID-19 declaration under Section 564(b)(1) of the Act, 21 U.S.C.section 360bbb-3(b)(1), unless the authorization is terminated  or revoked sooner.       Influenza A by PCR  NEGATIVE NEGATIVE Final   Influenza B by PCR NEGATIVE NEGATIVE Final    Comment: (NOTE) The Xpert Xpress SARS-CoV-2/FLU/RSV plus assay is intended as an aid in the diagnosis of influenza from Nasopharyngeal swab specimens and should not be used as a sole basis for treatment. Nasal washings and aspirates are unacceptable for Xpert Xpress SARS-CoV-2/FLU/RSV testing.  Fact Sheet for Patients: EntrepreneurPulse.com.au  Fact Sheet for Healthcare Providers: IncredibleEmployment.be  This test is not yet approved or cleared by the [place] FDA and has been authorized for detection and/or diagnosis of SARS-CoV-2 by FDA under an Emergency Use Authorization (EUA). This EUA will remain in effect (meaning this test can be used) for the duration of the COVID-19 declaration under Section 564(b)(1) of the Act, 21 U.S.C. section 360bbb-3(b)(1), unless the authorization is terminated or revoked.  Performed at [facility], [address] [place], [place] [address]   Body fluid culture w Gram Stain     Status: None (Preliminary result)   Collection Time: 08/31/21  1:24 PM   Specimen: Peritoneal Washings; Body Fluid  Result Value Ref Range Status   Specimen Description   Final    PERITONEAL Performed at [facility], [address], [place], [place] [address]    Special Requests   Final    NONE Performed at [facility], [address] [place], [place] [address]    Gram Stain   Final    NO SQUAMOUS EPITHELIAL CELLS SEEN FEW WBC SEEN NO ORGANISMS SEEN    Culture   Final    NO GROWTH 2 DAYS Performed at [facility], [place] [address] [place], [place] [address]    Report Status PENDING  Incomplete         Radiology Studies: DG Abd 1 View  Result Date: 09/02/2021 CLINICAL DATA:  61 year old female with history of nausea and vomiting. History of ovarian cancer. EXAM: ABDOMEN - 1 VIEW COMPARISON:  No priors.  FINDINGS: The bowel gas pattern is normal. No radio-opaque calculi or other significant radiographic abnormality are seen. Surgical clips project over the right upper quadrant of the abdomen, likely from prior cholecystectomy. IMPRESSION: 1. Nonobstructive bowel gas pattern. 2. No pneumoperitoneum. Electronically Signed   By: Vinnie Langton M.D.   On: 09/02/2021 06:06        Scheduled Meds:  Chlorhexidine Gluconate Cloth  6 each Topical Daily  DULoxetine  30 mg Oral Daily   famotidine  20 mg Oral Daily   feeding supplement  237 mL Oral BID BM   latanoprost  1 drop Both Eyes QHS   multivitamin with minerals  1 tablet Oral Daily   polyethylene glycol  17 g Oral Daily   scopolamine  1 patch Transdermal Q72H   senna  1 tablet Oral Daily   timolol  1 drop Both Eyes BID   Continuous Infusions:  sodium chloride 75 mL/hr at 09/03/21 0421   ceFEPime (MAXIPIME) IV 2 g (09/03/21 0426)   metronidazole Stopped (09/02/21 2215)   promethazine (PHENERGAN) injection (IM or IVPB) 12.5 mg (09/02/21 0202)     LOS: 3 days    Time spent: 25 mins     Wyvonnia Dusky, MD [facility] Pager [phone]  If 7PM-7AM, please contact night-coverage 09/03/2021, 7:25 AM

## 2021-09-03 NOTE — Progress Notes (Signed)
OT Cancellation Note  Patient Details Name: Brittany Montoya MRN: [id number] DOB: [birth year]   Cancelled Treatment:    Reason Eval/Treat Not Completed: Patient declined, no reason specified. Pt declining therapy today 2/2 nausea/vomiting and feeling unwell. Will re-attempt next date for OT evaluation.  Ardeth Perfect., MPH, MS, OTR/L ascom [phone] 09/03/21, 4:00 PM

## 2021-09-03 NOTE — Consult Note (Addendum)
NAME: Brittany Montoya  DOB: 1961/06/26  MRN: [id number]  Date/Time: 09/03/2021 10:52 AM  REQUESTING PROVIDER: Dr.Williams Subjective:  REASON FOR CONSULT: SBP ? Brittany Montoya is a 60 y.o. 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  And she was put on Abraxane on 06/07/2021.  Patient has had therapeutic paracentesis a few times.  She completed 6 cycles of carboplatin/Abraxane with G-CSF support the last chemotherapy was on 08/17/2021 and GCSF was on 08/20/21. She presented to oncology on 08/31/2021 with intractable nausea vomiting and diarrhea.  She was also complaining of abdominal pain and and she was hospitalized.  Patient also has cirrhosis of the liver and thrombocytopenia.  She has PE and is on Eliquis. Her last paracentesis was 08/29/2021. I am asked to see patient for leucocytosis She has no fever Has pain on the rt lower quadrant of the abdomen Has nausea and vomiting Poor appetitie No cough Has swelling of legs No bleeding per rectum Has a facial rash for the past month  Past Medical History:  Diagnosis Date   Anxiety    Arthritis    Ascites    Cirrhosis of liver (HCC)    DDD (degenerative disc disease), cervical    Depression    Family history of breast cancer    GERD (gastroesophageal reflux disease)    Glaucoma    Headache    migraines   Hypertension    Ovarian cancer ([place]) 04/15/2021   PONV (postoperative nausea and vomiting)    Pulmonary embolism ([place])    Sleep apnea    Venous stasis     Past Surgical History:  Procedure Laterality Date   ABDOMINAL HYSTERECTOMY N/A 2008   may have had left ovary removed   [place]     2012, 2015, 2020   COLONOSCOPY N/A 06/29/2021   Procedure: COLONOSCOPY;  Surgeon: Toledo, Benay Pike, MD;  Location: [facility];  Service: Gastroenterology;  Laterality: N/A;  DOCTORS ARE IN AGREEMENT THAT TOLEDO CAN DO THIS PROCEDURE IN RUSSO'S BLOCK   ESOPHAGOGASTRODUODENOSCOPY N/A 06/29/2021   Procedure: ESOPHAGOGASTRODUODENOSCOPY (EGD);  Surgeon: Toledo, Benay Pike, MD;  Location: [facility] END[facility];  Service: Gastroenterology;  Laterality: N/A;   PARACENTESIS  04/20/2021   PILONIDAL CYST / SINUS EXCISION     PORTACATH PLACEMENT Right 06/01/2021   Procedure: INSERTION PORT-A-CATH;  Surgeon: Herbert Pun, MD;  Location: [facility] ORS;  Service: General;  Laterality: Right;    Social History   Socioeconomic History   Marital status: Married    Spouse name: Barnabas Lister   Number of children: 2   Years of education: Not on file   Highest education level: Not on file  Occupational History   Not on file  Tobacco Use   Smoking status: Never   Smokeless tobacco: Never  Vaping Use   Vaping Use: Never used  Substance and Sexual Activity   Alcohol use: No   Drug use: No   Sexual activity: Not Currently    Birth control/protection: Surgical  Other Topics Concern   Not on file  Social History Narrative   Not on file   Social Determinants of Health  Financial Resource Strain: Not on file  Food Insecurity: Not on file  Transportation Needs: Not on file  Physical Activity: Not on file  Stress: Not on file  Social Connections: Not on file  Intimate Partner Violence: Not on file    Family History  Problem Relation Age of Onset   Breast cancer Mother 11   Breast cancer Maternal Aunt        d. under 50   Allergies  Allergen Reactions   Paclitaxel Shortness Of Breath and Other (See Comments)    pt flush, stated she didn't feel well, taxol stopped (05/10/2021)   Amlodipine Other (See Comments) and Rash    Not effective Not  effective    Penicillins Rash   I? Current Facility-Administered Medications  Medication Dose Route Frequency Provider Last Rate Last Admin   0.9 %  sodium chloride infusion   Intravenous Continuous Ivor Costa, MD 75 mL/hr at 09/03/21 0421 New Bag at 09/03/21 0421   acetaminophen (TYLENOL) tablet 650 mg  650 mg Oral Q6H PRN Ivor Costa, MD   650 mg at 08/31/21 2251   albuterol (PROVENTIL) (2.5 MG/3ML) 0.083% nebulizer solution 2.5 mg  2.5 mg Nebulization Q4H PRN Ivor Costa, MD       ceFEPIme (MAXIPIME) 2 g in sodium chloride 0.9 % 100 mL IVPB  2 g Intravenous Q8H Lockie Mola B, RPH 200 mL/hr at 09/03/21 0426 2 g at 09/03/21 0426   Chlorhexidine Gluconate Cloth 2 % PADS 6 each  6 each Topical Daily Ivor Costa, MD   6 each at 09/03/21 1001   clonazePAM (KLONOPIN) tablet 0.5 mg  0.5 mg Oral Daily PRN Ivor Costa, MD   0.5 mg at 08/31/21 2251   DULoxetine (CYMBALTA) DR capsule 30 mg  30 mg Oral Daily Ivor Costa, MD       famotidine (PEPCID) tablet 20 mg  20 mg Oral Daily Sharion Settler, NP   20 mg at 09/01/21 0843   feeding supplement (ENSURE ENLIVE / ENSURE PLUS) liquid 237 mL  237 mL Oral BID BM Wyvonnia Dusky, MD   237 mL at 09/03/21 1001   hydrALAZINE (APRESOLINE) injection 5 mg  5 mg Intravenous Q2H PRN Ivor Costa, MD       latanoprost (XALATAN) 0.005 % ophthalmic solution 1 drop  1 drop Both Eyes QHS Ivor Costa, MD   1 drop at 09/02/21 2052   lidocaine-prilocaine (EMLA) cream 1 application  1 application Topical Daily PRN Ivor Costa, MD       lip balm (BLISTEX) ointment   Topical PRN Renda Rolls, RPH       metroNIDAZOLE (FLAGYL) IVPB 500 mg  500 mg Intravenous Q12H Ivor Costa, MD 100 mL/hr at 09/03/21 1002 500 mg at 09/03/21 1002   morphine 2 MG/ML injection 2 mg  2 mg Intravenous Q4H PRN Ivor Costa, MD   2 mg at 09/02/21 1805   multivitamin with minerals tablet 1 tablet  1 tablet Oral Daily Ivor Costa, MD   1 tablet at 09/01/21 0843   nystatin (MYCOSTATIN) 100000 UNIT/ML  suspension 500,000 Units  5 mL Oral QID Wyvonnia Dusky, MD       ondansetron [facility]) injection 4 mg  4 mg Intravenous Q8H PRN Ivor Costa, MD   4 mg at 09/03/21 0417   pantoprazole (PROTONIX) injection 40 mg  40 mg Intravenous Q12H Wyvonnia Dusky, MD   40 mg at 09/03/21 0956   polyethylene glycol (MIRALAX / GLYCOLAX) packet 17 g  17 g Oral Daily Jacquelin Hawking, NP   17 g at 09/03/21 0037   prochlorperazine (COMPAZINE) injection 10 mg  10 mg Intravenous Q6H PRN Wyvonnia Dusky, MD       promethazine (PHENERGAN) 12.5 mg in sodium chloride 0.9 % 50 mL IVPB  12.5 mg Intravenous Q6H PRN Wyvonnia Dusky, MD 200 mL/hr at 09/02/21 0202 12.5 mg at 09/02/21 0202   scopolamine (TRANSDERM-SCOP) 1 MG/3DAYS 1.5 mg  1 patch Transdermal Q72H Wyvonnia Dusky, MD   1.5 mg at 09/02/21 0488   senna (SENOKOT) tablet 8.6 mg  1 tablet Oral Daily Faythe Casa E, NP   8.6 mg at 09/01/21 1434   timolol (TIMOPTIC) 0.5 % ophthalmic solution 1 drop  1 drop Both Eyes BID Ivor Costa, MD       traMADol Veatrice Bourbon) tablet 50 mg  50 mg Oral Q6H PRN Ivor Costa, MD   50 mg at 09/01/21 1659   triamcinolone cream (KENALOG) 0.1 % cream 1 application  1 application Topical Daily PRN Ivor Costa, MD       Facility-Administered Medications Ordered in Other Encounters  Medication Dose Route Frequency Provider Last Rate Last Admin   LORazepam (ATIVAN) injection 0.5 mg  0.5 mg Intravenous Once PRN Earlie Server, MD         Abtx:  Anti-infectives (From admission, onward)    Start     Dose/Rate Route Frequency Ordered Stop   09/01/21 2200  ceFEPIme (MAXIPIME) 2 g in sodium chloride 0.9 % 100 mL IVPB        2 g 200 mL/hr over 30 Minutes Intravenous Every 8 hours 09/01/21 1230     09/01/21 0100  ceFEPIme (MAXIPIME) 2 g in sodium chloride 0.9 % 100 mL IVPB  Status:  Discontinued        2 g 200 mL/hr over 30 Minutes Intravenous Every 12 hours 08/31/21 1359 09/01/21 1230   08/31/21 2200  metroNIDAZOLE (FLAGYL) IVPB 500 mg         500 mg 100 mL/hr over 60 Minutes Intravenous Every 12 hours 08/31/21 1350     08/31/21 1130  ceFEPIme (MAXIPIME) 2 g in sodium chloride 0.9 % 100 mL IVPB        2 g 200 mL/hr over 30 Minutes Intravenous  Once 08/31/21 1121 08/31/21 1401   08/31/21 1115  metroNIDAZOLE (FLAGYL) IVPB 500 mg        500 mg 100 mL/hr over 60 Minutes Intravenous  Once 08/31/21 1111 08/31/21 1312       REVIEW OF SYSTEMS:  Const: negative fever, negative chills, negative weight loss Eyes: negative diplopia or visual changes, negative eye pain ENT: negative coryza, negative sore throat Resp: negative cough, hemoptysis, dyspnea Cards: negative for chest pain, palpitations, lower extremity edema GU: negative for frequency, dysuria and hematuria GI: as above Skin: as above Heme: negative for easy bruising and gum/nose bleeding MS: general weakness Chronic low back pain Neurolo:negative for headaches, dizziness, vertigo, memory problems  Psych: negative for feelings of anxiety, depression  Endocrine: negative for thyroid, diabetes Allergy/Immunology-as above Objective:  VITALS:  BP 120/83 (BP Location: Left Arm)    Pulse (!) 102    Temp 98.6 F (37 C) (Oral)    Resp 17    Ht 5\' 1"  (1.549 m)    Wt 85.6 kg    SpO2 99%    BMI 35.66 kg/m  PHYSICAL EXAM:  General: Alert, cooperative, no distress, pale Head: Normocephalic, without obvious abnormality, atraumatic.  Eyes: Conjunctivae clear, anicteric sclerae. Pupils are equal ENT Nares normal. No drainage or sinus tenderness. Roof of the oral cavity has superficial ulcers Tongue coated Scaly rash over malar area and forehead Pin point papules over upper lip Lip ulceration   Neck: Supple, symmetrical, no adenopathy, thyroid: non tender no carotid bruit and no JVD. Back: No CVA tenderness. Lungs:b/l air entry Port  Heart: Regular rate and rhythm, no murmur, rub or gallop. Abdomen: Soft, distended Tenderness lower abdomen Extremities: edema  legs Skin: No rashes or lesions. Or bruising Lymph: Cervical, supraclavicular normal. Neurologic: Grossly non-focal Pertinent Labs Lab Results CBC     Component Value Date/Time   WBC 32.9 (H) 09/03/2021 0435   RBC 2.64 (L) 09/03/2021 0435   HGB 9.0 (L) 09/03/2021 0435   HCT 26.5 (L) 09/03/2021 0435   PLT 35 (L) 09/03/2021 0435   MCV 100.4 (H) 09/03/2021 0435   MCH 34.1 (H) 09/03/2021 0435   MCHC 34.0 09/03/2021 0435   RDW 17.3 (H) 09/03/2021 0435   LYMPHSABS 1.1 09/03/2021 0435   MONOABS 1.3 (H) 09/03/2021 0435   EOSABS 0.0 09/03/2021 0435   BASOSABS 0.0 09/03/2021 0435    CMP Latest Ref Rng & Units 09/03/2021 09/02/2021 09/01/2021  Glucose 70 - 99 mg/dL 138(H) 105(H) 85  BUN 8 - 23 mg/dL 15 12 15   Creatinine 0.44 - 1.00 mg/dL 1.01(H) 0.85 0.88  Sodium 135 - 145 mmol/L 133(L) 133(L) 133(L)  Potassium 3.5 - 5.1 mmol/L 3.8 3.8 3.9  Chloride 98 - 111 mmol/L 101 101 102  CO2 22 - 32 mmol/L 25 23 25   Calcium 8.9 - 10.3 mg/dL 9.1 9.0 8.0(L)  Total Protein 6.5 - 8.1 g/dL 5.5(L) 5.7(L) -  Total Bilirubin 0.3 - 1.2 mg/dL 0.6 0.8 -  Alkaline Phos 38 - 126 U/L 159(H) 147(H) -  AST 15 - 41 U/L 24 24 -  ALT 0 - 44 U/L 17 17 -      Microbiology: Recent Results (from the past 240 hour(s))  Blood culture (routine x 2)     Status: None (Preliminary result)   Collection Time: 08/31/21 11:16 AM   Specimen: BLOOD LEFT ARM  Result Value Ref Range Status   Specimen Description BLOOD LEFT ARM  Final   Special Requests   Final    BOTTLES DRAWN AEROBIC AND ANAEROBIC Blood Culture adequate volume   Culture   Final    NO GROWTH 3 DAYS Performed at [facility], [address] [place], [place] [address]    Report Status PENDING  Incomplete  Blood culture (routine x 2)     Status: None (Preliminary result)   Collection Time: 08/31/21 11:37 AM   Specimen: BLOOD  Result Value Ref Range Status   Specimen Description BLOOD LEFT ANTECUBITAL  Final   Special Requests   Final     BOTTLES DRAWN AEROBIC AND ANAEROBIC Blood Culture adequate volume   Culture   Final    NO GROWTH 3 DAYS Performed at [facility], [address], [place], [place] [address]    Report Status PENDING  Incomplete  Resp Panel by RT-PCR (Flu A&B, Covid) Nasopharyngeal Swab     Status: None   Collection Time: 08/31/21  1:03 PM   Specimen: Nasopharyngeal Swab; Nasopharyngeal(NP) swabs in vial transport medium  Result Value Ref Range Status   SARS Coronavirus 2 by RT PCR NEGATIVE NEGATIVE Final    Comment: (NOTE) SARS-CoV-2 target nucleic acids are NOT DETECTED.  The SARS-CoV-2 RNA is  generally detectable in upper respiratory specimens during the acute phase of infection. The lowest concentration of SARS-CoV-2 viral copies this assay can detect is 138 copies/mL. A negative result does not preclude SARS-Cov-2 infection and should not be used as the sole basis for treatment or other patient management decisions. A negative result may occur with  improper specimen collection/handling, submission of specimen other than nasopharyngeal swab, presence of viral mutation(s) within the areas targeted by this assay, and inadequate number of viral copies(<138 copies/mL). A negative result must be combined with clinical observations, patient history, and epidemiological information. The expected result is Negative.  Fact Sheet for Patients:  EntrepreneurPulse.com.au  Fact Sheet for Healthcare Providers:  IncredibleEmployment.be  This test is no t yet approved or cleared by the [place] FDA and  has been authorized for detection and/or diagnosis of SARS-CoV-2 by FDA under an Emergency Use Authorization (EUA). This EUA will remain  in effect (meaning this test can be used) for the duration of the COVID-19 declaration under Section 564(b)(1) of the Act, 21 U.S.C.section 360bbb-3(b)(1), unless the authorization is terminated  or revoked sooner.        Influenza A by PCR NEGATIVE NEGATIVE Final   Influenza B by PCR NEGATIVE NEGATIVE Final    Comment: (NOTE) The Xpert Xpress SARS-CoV-2/FLU/RSV plus assay is intended as an aid in the diagnosis of influenza from Nasopharyngeal swab specimens and should not be used as a sole basis for treatment. Nasal washings and aspirates are unacceptable for Xpert Xpress SARS-CoV-2/FLU/RSV testing.  Fact Sheet for Patients: EntrepreneurPulse.com.au  Fact Sheet for Healthcare Providers: IncredibleEmployment.be  This test is not yet approved or cleared by the [place] FDA and has been authorized for detection and/or diagnosis of SARS-CoV-2 by FDA under an Emergency Use Authorization (EUA). This EUA will remain in effect (meaning this test can be used) for the duration of the COVID-19 declaration under Section 564(b)(1) of the Act, 21 U.S.C. section 360bbb-3(b)(1), unless the authorization is terminated or revoked.  Performed at [facility], [address] [place], [place] [address]   Body fluid culture w Gram Stain     Status: None (Preliminary result)   Collection Time: 08/31/21  1:24 PM   Specimen: Peritoneal Washings; Body Fluid  Result Value Ref Range Status   Specimen Description   Final    PERITONEAL Performed at [facility], [address], [place], [place] [address]    Special Requests   Final    NONE Performed at [facility], [place], [place] [address]    Gram Stain   Final    NO SQUAMOUS EPITHELIAL CELLS SEEN FEW WBC SEEN NO ORGANISMS SEEN    Culture   Final    NO GROWTH 3 DAYS Performed at [facility], [place] [address] [place], [place] [address]    Report Status PENDING  Incomplete    IMAGING RESULTS: Aug 31, 2021  Oct 2022  I have personally reviewed the films ? 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 Severe leukocytosis This looks like a leukemoid reaction and doubt it is due  SBP. Could be triggered by Gcsf- no response to antibiotics .  Need to rule out any hemorrhagic manifestation in the ovary On cefepime + flagyl- change cefepime to cefazolin  Recommend CXR   Facial rash ? Rosacea, ? Seb derm ?? HSV over lips and mouth- swab sent for HSV DNA PCR  Anemia  Thrombocytopenia ? Cause?chemo ? cirrhosis  PT PTT okay  Hypoalbuminemia Increase in alkaline phosphatase rule out liver mets   History of PE on Eliquis. ___________________________________________________ Discussed with patient,and her husband Note:  This document was prepared using Dragon voice recognition software and may include unintentional dictation errors.

## 2021-09-04 ENCOUNTER — Telehealth: Payer: Self-pay

## 2021-09-04 LAB — COMPREHENSIVE METABOLIC PANEL
ALT: 14 U/L (ref 0–44)
AST: 21 U/L (ref 15–41)
Albumin: 2 g/dL — ABNORMAL LOW (ref 3.5–5.0)
Alkaline Phosphatase: 140 U/L — ABNORMAL HIGH (ref 38–126)
Anion gap: 5 (ref 5–15)
BUN: 17 mg/dL (ref 8–23)
CO2: 23 mmol/L (ref 22–32)
Calcium: 8.8 mg/dL — ABNORMAL LOW (ref 8.9–10.3)
Chloride: 104 mmol/L (ref 98–111)
Creatinine, Ser: 0.89 mg/dL (ref 0.44–1.00)
GFR, Estimated: >60 mL/min (ref >60)
Glucose, Bld: 101 mg/dL — ABNORMAL HIGH (ref 70–99)
Potassium: 3.8 mmol/L (ref 3.5–5.1)
Sodium: 132 mmol/L — ABNORMAL LOW (ref 135–145)
Total Bilirubin: 0.6 mg/dL (ref 0.3–1.2)
Total Protein: 4.8 g/dL — ABNORMAL LOW (ref 6.5–8.1)

## 2021-09-04 LAB — CBC WITH DIFFERENTIAL/PLATELET
Abs Immature Granulocytes: 1.17 10*3/uL — ABNORMAL HIGH (ref 0.00–0.07)
Basophils Absolute: 0.2 10*3/uL — ABNORMAL HIGH (ref 0.0–0.1)
Basophils Relative: 0 %
Eosinophils Absolute: 0 10*3/uL (ref 0.0–0.5)
Eosinophils Relative: 0 %
HCT: 23 % — ABNORMAL LOW (ref 36.0–46.0)
Hemoglobin: 8 g/dL — ABNORMAL LOW (ref 12.0–15.0)
Immature Granulocytes: 3 %
Lymphocytes Relative: 3 %
Lymphs Abs: 1.3 10*3/uL (ref 0.7–4.0)
MCH: 35.6 pg — ABNORMAL HIGH (ref 26.0–34.0)
MCHC: 34.8 g/dL (ref 30.0–36.0)
MCV: 102.2 fL — ABNORMAL HIGH (ref 80.0–100.0)
Monocytes Absolute: 1.7 10*3/uL — ABNORMAL HIGH (ref 0.1–1.0)
Monocytes Relative: 5 %
Neutro Abs: 32.9 10*3/uL — ABNORMAL HIGH (ref 1.7–7.7)
Neutrophils Relative %: 89 %
Platelets: 52 10*3/uL — ABNORMAL LOW (ref 150–400)
RBC: 2.25 MIL/uL — ABNORMAL LOW (ref 3.87–5.11)
RDW: 17.3 % — ABNORMAL HIGH (ref 11.5–15.5)
Smear Review: NORMAL
WBC: 37.2 10*3/uL — ABNORMAL HIGH (ref 4.0–10.5)
nRBC: 0.2 % (ref 0.0–0.2)

## 2021-09-04 LAB — BODY FLUID CULTURE W GRAM STAIN
Culture: NO GROWTH
Gram Stain: NONE SEEN

## 2021-09-04 MED ORDER — LEVOFLOXACIN 750 MG PO TABS: 750.0000 mg | ORAL_TABLET | Freq: Every day | ORAL | 0 refills | Status: DC

## 2021-09-04 MED ORDER — HEPARIN SOD (PORK) LOCK FLUSH 100 UNIT/ML IV SOLN
INTRAVENOUS | Status: AC
  Filled 2021-09-04: qty 5

## 2021-09-04 MED ORDER — METOCLOPRAMIDE HCL 10 MG PO TABS: 10.0000 mg | ORAL_TABLET | Freq: Three times a day (TID) | ORAL | 0 refills | Status: DC | PRN

## 2021-09-04 NOTE — Telephone Encounter (Signed)
Patient will be discharged from hospital today. Please schedule her for lab/NP/ IVF in 1 week and inform pt of appt. Thanks

## 2021-09-04 NOTE — Care Management (Signed)
°  Transition of Care [facility]) Screening Note   Patient Details  Name: Brittany Montoya Date of Birth: January 04, 1961   Transition of Care [facility]) CM/SW Contact:    Pete Pelt, RN Phone Number: 09/04/2021, 2:41 PM    Transition of Care Department [facility]) has reviewed patient and no TOC needs have been identified at this time. We will continue to monitor patient advancement through interdisciplinary progression rounds. If new patient transition needs arise, please place a TOC consult.

## 2021-09-04 NOTE — Discharge Summary (Signed)
Physician Discharge Summary  Brittany Montoya GNO:[id number] DOB: 1961-03-31 DOA: 08/31/2021  PCP: Adin Hector, MD  Admit date: 08/31/2021 Discharge date: 09/04/2021  Admitted From: home  Disposition:  home   Recommendations for Outpatient Follow-up:  Follow up with PCP in 1-2 weeks F/u w/ onco, Dr. Tasia Catchings, in 1 week   Home Health: no  Equipment/Devices:  Discharge Condition: stable  CODE STATUS: full  Diet recommendation: regular   Brief/Interim Summary: HPI was taken from Dr. Blaine Hamper: Brittany Montoya is a 60 y.o. female with medical history significant of ovarian cancer on chemotherapy, liver cirrhosis with ascites, hypertension, GERD, depression with anxiety, thrombocytopenia, PE on Eliquis, who presents with abdominal pain   Patient states that she has abdominal pain for almost 2 days.  Her abdominal pain is diffuse, constant, sharp, nonradiating, associated with nausea.  She had vomiting yesterday, no vomiting today.  No diarrhea.  She had subjective fever yesterday, no fever today.  Her body temperature is 98.4 in ED.  Patient has mild shortness breath, no cough, chest pain.  No symptoms of UTI.   ED Course: pt was found to have WBC 21.6, lactic acid 3.1, platelet 37, sodium 128, AKI with creatinine 1.26, BUN 19, temperature normal, blood pressure 122/86, heart rate 122, RR 26, oxygen saturation 92% on room air.  Patient is admitted to [place] bed as inpatient  CT abdomen/pelvis: 1. No acute findings in the abdomen or pelvis. 2. Unchanged appearance of large cystic and solid mass of the rightovary. 3. Similar peritoneal thickening and nodularity. 4. Small to moderate volume abdominal ascites, slightly decreased when compared with prior exam. 5.  Aortic Atherosclerosis [facility] course from Dr. Jimmye Norman 12/17-12/20/22: Pt presented w/ severe sepsis possibly secondary to SBP. Peritoneal fluid cx NGTD. Pt was on IV flagyl, cefepime while inpatient.  However, ID was consulted for increasing WBC. Unlikely SBP and more likely leukemoid reaction as per ID. Pt was d/c home w/ po levaquin x 5 days as per ID. Of note, pt had intermittent episodes of nausea and vomiting that was most responsive to reglan. Pt was d/c w/ po reglan prn for a couple of days. Pt understands this medication should not be taken long term secondary to potential to cause tardive dyskinesia. For more information, please see previous progress/consult notes.   Discharge Diagnoses:  Principal Problem:   Abdominal pain Active Problems:   Essential hypertension   Other cirrhosis of liver (HCC)   Ovarian cancer ([place])   Pulmonary embolus (HCC)   Depression with anxiety   Normocytic anemia   Thrombocytopenia (HCC)   Sepsis (HCC)   Hyponatremia   SBP (spontaneous bacterial peritonitis) (HCC)   Nausea & vomiting  Severe sepsis: met criteria w/ leukocytosis, tachycardia, tachypnea, elevated lactic acid and possibly due to SBP. S/p paracentesis which yielded 300 mL of dark serosanguineous fluid. Peritoneal fluid cx NGTD.  Continue on IV cefepime, flagyl. Blood cxs NGTD. Procal 0.12. Severe sepsis resolved   Possible SBP: transition to po levaquin x 5 days at d/c as per ID. Peritoneal fluid NGTD. Zofran, phenergan, compazine prn for nausea/vomtiing.   HTN: restart home dose of losartan, HCTZ    Cirrhosis: not on any meds as per med rec. MELD score of 7. Not secondary to alcohol use    Ovarian cancer: last chemo at the beginning of this month. Onco following and recs apprec    Pulmonary embolus:  dx approx 3 months ago. Restart home dose of eliquis as platelets >  50,000    Depression: severity unknown. Continue on home dose of duloxetine   Bicytopenia: w/ macrocytic anemia & thrombocytopenia. B12 & folate are not low.   Leukocytosis: continues to rise. Afebrile. Continue on IV abxs. Likely leukemoid reaction as per ID   Hyponatremia: stable. Will continue to monitor    Obesity: BMI 35.6. Complicates prognosis    Discharge Instructions  Discharge Instructions     Diet general   Complete by: As directed    Discharge instructions   Complete by: As directed    F/u w/ PCP in 1-2 weeks. F/u w/ onco, Dr. Tasia Catchings, in 1 week   Increase activity slowly   Complete by: As directed       Allergies as of 09/04/2021       Reactions   Paclitaxel Shortness Of Breath, Other (See Comments)   pt flush, stated she didn't feel well, taxol stopped (05/10/2021)   Amlodipine Other (See Comments), Rash   Not effective Not effective   Penicillins Rash        Medication List     TAKE these medications    acetaminophen 500 MG tablet Commonly known as: TYLENOL Take 1,000 mg by mouth at bedtime.   albuterol 108 (90 Base) MCG/ACT inhaler Commonly known as: VENTOLIN HFA Inhale 1-2 puffs into the lungs every 6 (six) hours as needed for wheezing or shortness of breath.   apixaban 5 MG Tabs tablet Commonly known as: Eliquis Take 1 tablet (5 mg total) by mouth 2 (two) times daily.   clonazePAM 0.5 MG tablet Commonly known as: KLONOPIN Take 1 tablet (0.5 mg total) by mouth daily as needed for anxiety.   dexamethasone 4 MG tablet Commonly known as: DECADRON Take 2 tablets (8 mg total) by mouth See admin instructions. Take 79m daily for 2 days prior to your chemotherapy   DULoxetine 30 MG capsule Commonly known as: Cymbalta Take 1 capsule (30 mg total) by mouth daily.   hydrochlorothiazide 12.5 MG tablet Commonly known as: HYDRODIURIL Take 12.5 mg by mouth daily.   hydrocortisone butyrate 0.1 % Crea cream Commonly known as: LUCOID Apply 1 application topically daily.   latanoprost 0.005 % ophthalmic solution Commonly known as: XALATAN Place 1 drop into both eyes at bedtime.   levofloxacin 750 MG tablet Commonly known as: Levaquin Take 1 tablet (750 mg total) by mouth daily for 5 days.   lidocaine-prilocaine cream Commonly known as: EMLA Apply to  affected area once What changed:  how much to take how to take this when to take this reasons to take this   losartan 50 MG tablet Commonly known as: COZAAR Take 50 mg by mouth daily.   metoCLOPramide 10 MG tablet Commonly known as: REGLAN Take 1 tablet (10 mg total) by mouth every 8 (eight) hours as needed for up to 7 days for nausea or vomiting.   multivitamin with minerals Tabs tablet Take 1 tablet by mouth daily.   nystatin 100000 UNIT/ML suspension Commonly known as: MYCOSTATIN Use as directed 5 mLs (500,000 Units total) in the mouth or throat 4 (four) times daily.   ondansetron 8 MG tablet Commonly known as: Zofran Take 1 tablet (8 mg total) by mouth 2 (two) times daily as needed for refractory nausea / vomiting. Start on day 3 after carboplatin chemo.   potassium chloride SA 20 MEQ tablet Commonly known as: KLOR-CON M Take 1 tablet (20 mEq total) by mouth 2 (two) times daily.   prochlorperazine 10 MG tablet Commonly known  as: COMPAZINE Take 1 tablet (10 mg total) by mouth every 6 (six) hours as needed (Nausea or vomiting).   timolol 0.5 % ophthalmic solution Commonly known as: TIMOPTIC Place 1 drop into both eyes 2 (two) times daily.   traMADol 50 MG tablet Commonly known as: ULTRAM Take 50 mg by mouth every 6 (six) hours as needed.   triamcinolone cream 0.1 % Commonly known as: KENALOG Apply 1 application topically daily as needed (irritation).        Allergies  Allergen Reactions   Paclitaxel Shortness Of Breath and Other (See Comments)    pt flush, stated she didn't feel well, taxol stopped (05/10/2021)   Amlodipine Other (See Comments) and Rash    Not effective Not effective    Penicillins Rash    Consultations: ID Onco    Procedures/Studies: DG Abd 1 View  Result Date: 09/02/2021 CLINICAL DATA:  60 year old female with history of nausea and vomiting. History of ovarian cancer. EXAM: ABDOMEN - 1 VIEW COMPARISON:  No priors. FINDINGS: The  bowel gas pattern is normal. No radio-opaque calculi or other significant radiographic abnormality are seen. Surgical clips project over the right upper quadrant of the abdomen, likely from prior cholecystectomy. IMPRESSION: 1. Nonobstructive bowel gas pattern. 2. No pneumoperitoneum. Electronically Signed   By: Vinnie Langton M.D.   On: 09/02/2021 06:06   CT ABDOMEN PELVIS W CONTRAST  Result Date: 08/31/2021 CLINICAL DATA:  Abdominal pain, history of ovarian cancer EXAM: CT ABDOMEN AND PELVIS WITH CONTRAST TECHNIQUE: Multidetector CT imaging of the abdomen and pelvis was performed using the standard protocol following bolus administration of intravenous contrast. CONTRAST:  163m OMNIPAQUE IOHEXOL 300 MG/ML  SOLN COMPARISON:  CT abdomen and pelvis dated June 25, 2021 FINDINGS: Lower chest: No acute abnormality. Hepatobiliary: No focal liver abnormality is seen. Status post cholecystectomy. No biliary dilatation. In Pancreas: Unremarkable. No pancreatic ductal dilatation or surrounding inflammatory changes. Spleen: Normal in size without focal abnormality. Adrenals/Urinary Tract: Adrenal glands are unremarkable. Kidneys are normal, without renal calculi, focal lesion, or hydronephrosis. Bladder is unremarkable. Stomach/Bowel: Stomach is within normal limits. Appendix appears normal. No evidence of bowel wall thickening, distention, or inflammatory changes. Vascular/Lymphatic: Aortic atherosclerosis. No enlarged abdominal or pelvic lymph nodes. Reproductive: Uterus is surgically absent. Unchanged appearance of large cystic and solid mass of the right ovary, measures approximately 1.9 x 1.3 cm, when measured on coronal image, unchanged in size when remeasured in similar plane on prior exam. Other: Small fluid containing umbilical hernia. Similar peritoneal thickening and nodularity. Small to moderate volume abdominal ascites, slightly decreased when compared to the prior exam. Musculoskeletal: No acute or  significant osseous findings. IMPRESSION: 1. No acute findings in the abdomen or pelvis. 2. Unchanged appearance of large cystic and solid mass of the right ovary. 3. Similar peritoneal thickening and nodularity. 4. Small to moderate volume abdominal ascites, slightly decreased when compared with prior exam. 5.  Aortic Atherosclerosis (ICD10-I70.0). Electronically Signed   By: LYetta GlassmanM.D.   On: 08/31/2021 12:24   [place]oreaParacentesis  Result Date: 08/31/2021 INDICATION: Patient with history of ovarian cancer, recurrent ascites, new onset abdominal pain following paracentesis 08/29/2021. Request to IR for diagnostic and therapeutic paracentesis EXAM: ULTRASOUND GUIDED DIAGNOSTIC AND THERAPEUTIC PARACENTESIS MEDICATIONS: 8 mL 1% lidocaine COMPLICATIONS: None immediate. PROCEDURE: Informed written consent was obtained from the patient after a discussion of the risks, benefits and alternatives to treatment. A timeout was performed prior to the initiation of the procedure. Initial ultrasound scanning demonstrates a  small amount of ascites within the right upper abdominal quadrant. The right upper abdomen was prepped and draped in the usual sterile fashion. 1% lidocaine was used for local anesthesia. Following this, a 19 gauge, 7-cm, Yueh catheter was introduced. An ultrasound image was saved for documentation purposes. The paracentesis was performed. The catheter was removed and a dressing was applied. The patient tolerated the procedure well without immediate post procedural complication. FINDINGS: A total of approximately 300 mL of clear, dark serosanguineous fluid was removed. Samples were sent to the laboratory as requested by the clinical team. IMPRESSION: Successful ultrasound-guided paracentesis yielding 300 milliliters of peritoneal fluid. Read by Candiss Norse, PA-C Electronically Signed   By: Miachel Roux M.D.   On: 08/31/2021 14:48   US Paracentesis  Result Date: 08/29/2021 INDICATION:  Patient with history of ovarian cancer recurrent ascites request for paracentesis. EXAM: ULTRASOUND GUIDED  PARACENTESIS MEDICATIONS: Local 1% lidocaine only. COMPLICATIONS: None immediate. PROCEDURE: Informed written consent was obtained from the patient after a discussion of the risks, benefits and alternatives to treatment. A timeout was performed prior to the initiation of the procedure. Initial ultrasound scanning demonstrates a large amount of ascites within the left lower abdominal quadrant. The left lower abdomen was prepped and draped in the usual sterile fashion. 1% lidocaine was used for local anesthesia. Following this, a 19 gauge, 7-cm, Yueh catheter was introduced. An ultrasound image was saved for documentation purposes. The paracentesis was performed. The catheter was removed and a dressing was applied. The patient tolerated the procedure well without immediate post procedural complication. FINDINGS: A total of approximately 4 L of amber colored fluid was removed. IMPRESSION: Successful ultrasound-guided paracentesis yielding 4 liters of peritoneal fluid. This exam was performed by Tsosie Billing PA-C, and was supervised and interpreted by Dr. Vernard Gambles. Electronically Signed   By: Lucrezia Europe M.D.   On: 08/29/2021 16:23   US Paracentesis  Result Date: 08/22/2021 INDICATION: Patient with history of ovarian cancer and recurrent ascites request for paracentesis. EXAM: ULTRASOUND GUIDED  PARACENTESIS MEDICATIONS: Local 1% lidocaine only. COMPLICATIONS: None immediate. PROCEDURE: Informed written consent was obtained from the patient after a discussion of the risks, benefits and alternatives to treatment. A timeout was performed prior to the initiation of the procedure. Initial ultrasound scanning demonstrates a large amount of ascites within the right lower abdominal quadrant. The right lower abdomen was prepped and draped in the usual sterile fashion. 1% lidocaine was used for local anesthesia.  Following this, a 19 gauge, 7-cm, Yueh catheter was introduced. An ultrasound image was saved for documentation purposes. The paracentesis was performed. The catheter was removed and a dressing was applied. The patient tolerated the procedure well without immediate post procedural complication. FINDINGS: A total of approximately 4 liters of amber colored fluid was removed. IMPRESSION: Successful ultrasound-guided paracentesis yielding 4 liters of peritoneal fluid. This exam was performed by Tsosie Billing PA-C, and was supervised and interpreted by Dr. Pascal Lux. Electronically Signed   By: Sandi Mariscal M.D.   On: 08/22/2021 16:38   US Paracentesis  Result Date: 08/16/2021 INDICATION: 60 year old female with history of ovarian cancer with recurrent ascites presenting for paracentesis. EXAM: ULTRASOUND GUIDED  PARACENTESIS MEDICATIONS: None. COMPLICATIONS: None immediate. PROCEDURE: Informed written consent was obtained from the patient after a discussion of the risks, benefits and alternatives to treatment. A timeout was performed prior to the initiation of the procedure. Initial ultrasound scanning demonstrates a large amount of ascites within the right lower abdominal quadrant. The right lower abdomen  was prepped and draped in the usual sterile fashion. 1% lidocaine was used for local anesthesia. Following this, a 6 Fr Safe-T-Centesis catheter was introduced. An ultrasound image was saved for documentation purposes. The paracentesis was performed. The catheter was removed and a dressing was applied. The patient tolerated the procedure well without immediate post procedural complication. FINDINGS: A total of approximately 4 of translucent, amber fluid was removed. IMPRESSION: Successful ultrasound-guided paracentesis yielding 4 liters of peritoneal fluid. Ruthann Cancer, MD [facility] Electronically Signed   By: Ruthann Cancer M.D.   On: 08/16/2021 16:25    (Echo, Carotid, EGD, Colonoscopy, ERCP)    Subjective: Pt c/o fatigue. Pt denies nausea or vomiting   Discharge Exam: Vitals:   09/04/21 0753 09/04/21 1152  BP: 121/85 115/87  Pulse: (!) 103 (!) 103  Resp: 16 17  Temp: 98.7 F (37.1 C) 99.1 F (37.3 C)  SpO2: 99% 99%   Vitals:   09/04/21 0008 09/04/21 0503 09/04/21 0753 09/04/21 1152  BP: 124/74 110/77 121/85 115/87  Pulse: 95 95 (!) 103 (!) 103  Resp: _0 Temp: 98.7 F (37.1 C) 98.4 F (36.9 C) 98.7 F (37.1 C) 99.1 F (37.3 C)  TempSrc: Oral Oral    SpO2: 99% 100% 99% 99%  Weight:      Height:        General: Pt is alert, awake, not in acute distress Cardiovascular: S1/S2 +, no rubs, no gallops Respiratory: CTA bilaterally, no wheezing, no rhonchi Abdominal: Soft, NT, obese, bowel sounds + Extremities: no cyanosis    The results of significant diagnostics from this hospitalization (including imaging, microbiology, ancillary and laboratory) are listed below for reference.     Microbiology: Recent Results (from the past 240 hour(s))  Blood culture (routine x 2)     Status: None (Preliminary result)   Collection Time: 08/31/21 11:16 AM   Specimen: BLOOD LEFT ARM  Result Value Ref Range Status   Specimen Description BLOOD LEFT ARM  Final   Special Requests   Final    BOTTLES DRAWN AEROBIC AND ANAEROBIC Blood Culture adequate volume   Culture   Final    NO GROWTH 4 DAYS Performed at [facility], [address], [place], [place] [address]    Report Status PENDING  Incomplete  Blood culture (routine x 2)     Status: None (Preliminary result)   Collection Time: 08/31/21 11:37 AM   Specimen: BLOOD  Result Value Ref Range Status   Specimen Description BLOOD LEFT ANTECUBITAL  Final   Special Requests   Final    BOTTLES DRAWN AEROBIC AND ANAEROBIC Blood Culture adequate volume   Culture   Final    NO GROWTH 4 DAYS Performed at [facility], [address], [place], [place]  [address]    Report Status PENDING  Incomplete  Resp Panel by RT-PCR (Flu A&B, Covid) Nasopharyngeal Swab     Status: None   Collection Time: 08/31/21  1:03 PM   Specimen: Nasopharyngeal Swab; Nasopharyngeal(NP) swabs in vial transport medium  Result Value Ref Range Status   SARS Coronavirus 2 by RT PCR NEGATIVE NEGATIVE Final    Comment: (NOTE) SARS-CoV-2 target nucleic acids are NOT DETECTED.  The SARS-CoV-2 RNA is generally detectable in upper respiratory specimens during the acute phase of infection. The lowest concentration of SARS-CoV-2 viral copies this assay can detect is 138 copies/mL. A negative result does not preclude SARS-Cov-2 infection and should not  be used as the sole basis for treatment or other patient management decisions. A negative result may occur with  improper specimen collection/handling, submission of specimen other than nasopharyngeal swab, presence of viral mutation(s) within the areas targeted by this assay, and inadequate number of viral copies(<138 copies/mL). A negative result must be combined with clinical observations, patient history, and epidemiological information. The expected result is Negative.  Fact Sheet for Patients:  EntrepreneurPulse.com.au  Fact Sheet for Healthcare Providers:  IncredibleEmployment.be  This test is no t yet approved or cleared by the [place] FDA and  has been authorized for detection and/or diagnosis of SARS-CoV-2 by FDA under an Emergency Use Authorization (EUA). This EUA will remain  in effect (meaning this test can be used) for the duration of the COVID-19 declaration under Section 564(b)(1) of the Act, 21 U.S.C.section 360bbb-3(b)(1), unless the authorization is terminated  or revoked sooner.       Influenza A by PCR NEGATIVE NEGATIVE Final   Influenza B by PCR NEGATIVE NEGATIVE Final    Comment: (NOTE) The Xpert Xpress SARS-CoV-2/FLU/RSV plus assay is intended as an  aid in the diagnosis of influenza from Nasopharyngeal swab specimens and should not be used as a sole basis for treatment. Nasal washings and aspirates are unacceptable for Xpert Xpress SARS-CoV-2/FLU/RSV testing.  Fact Sheet for Patients: EntrepreneurPulse.com.au  Fact Sheet for Healthcare Providers: IncredibleEmployment.be  This test is not yet approved or cleared by the [place] FDA and has been authorized for detection and/or diagnosis of SARS-CoV-2 by FDA under an Emergency Use Authorization (EUA). This EUA will remain in effect (meaning this test can be used) for the duration of the COVID-19 declaration under Section 564(b)(1) of the Act, 21 U.S.C. section 360bbb-3(b)(1), unless the authorization is terminated or revoked.  Performed at [facility], [address] [place], [place] [address]   Body fluid culture w Gram Stain     Status: None   Collection Time: 08/31/21  1:24 PM   Specimen: Peritoneal Washings; Body Fluid  Result Value Ref Range Status   Specimen Description   Final    PERITONEAL Performed at [facility], [address], [place], [place] [address]    Special Requests   Final    NONE Performed at [facility], [place], [place] [address]    Gram Stain   Final    NO SQUAMOUS EPITHELIAL CELLS SEEN FEW WBC SEEN NO ORGANISMS SEEN    Culture   Final    NO GROWTH 3 DAYS Performed at [facility], [place] [address] [place], [place] [address]    Report Status 09/04/2021 FINAL  Final     Labs: BNP (last 3 results) No results for input(s): BNP in the last 8760 hours. Basic Metabolic Panel: Recent Labs  Lab 08/31/21 1912 09/01/21 0540 09/02/21 0546 09/03/21 0435 09/04/21 0537  NA 135 133* 133* 133* 132*  K 3.8 3.9 3.8 3.8 3.8  CL 99 102 101 101 104  CO2 _0 GLUCOSE 99 85 105* 138* 101*  BUN _1 CREATININE 0.97 0.88 0.85 1.01* 0.89   CALCIUM 8.3* 8.0* 9.0 9.1 8.8*   Liver Function Tests: Recent Labs  Lab 08/30/21 1500 08/31/21 0904 09/02/21 0546 09/03/21 0435 09/04/21 0537  AST 36 37 _2 ALT _3 ALKPHOS 217* 214* 147* 159* 140*  BILITOT 0.6 0.6 0.8 0.6 0.6  PROT  6.4* 6.4* 5.7* 5.5* 4.8*  ALBUMIN 2.6* 2.6* 2.5* 2.4* 2.0*   Recent Labs  Lab 08/31/21 1136  LIPASE 31   Recent Labs  Lab 08/31/21 1912  AMMONIA 31   CBC: Recent Labs  Lab 09/02/21 0546 09/02/21 1755 09/03/21 0435 09/03/21 1826 09/04/21 0537  WBC 25.7* 35.3* 32.9* 39.5* 37.2*  NEUTROABS 22.9* 31.8* 30.0* 35.6* 32.9*  HGB 9.5* 8.8* 9.0* 9.2* 8.0*  HCT 27.9* 25.5* 26.5* 26.8* 23.0*  MCV 101.8* 101.6* 100.4* 102.3* 102.2*  PLT 24* 28* 35* 45* 52*   Cardiac Enzymes: No results for input(s): CKTOTAL, CKMB, CKMBINDEX, TROPONINI in the last 168 hours. BNP: Invalid input(s): POCBNP CBG: No results for input(s): GLUCAP in the last 168 hours. D-Dimer No results for input(s): DDIMER in the last 72 hours. Hgb A1c No results for input(s): HGBA1C in the last 72 hours. Lipid Profile No results for input(s): CHOL, HDL, LDLCALC, TRIG, CHOLHDL, LDLDIRECT in the last 72 hours. Thyroid function studies No results for input(s): TSH, T4TOTAL, T3FREE, THYROIDAB in the last 72 hours.  Invalid input(s): FREET3 Anemia work up No results for input(s): VITAMINB12, FOLATE, FERRITIN, TIBC, IRON, RETICCTPCT in the last 72 hours. Urinalysis    Component Value Date/Time   COLORURINE YELLOW (A) 08/31/2021 1705   APPEARANCEUR CLEAR (A) 08/31/2021 1705   LABSPEC >1.046 (H) 08/31/2021 1705   PHURINE 5.0 08/31/2021 1705   GLUCOSEU NEGATIVE 08/31/2021 1705   HGBUR NEGATIVE 08/31/2021 1705   BILIRUBINUR NEGATIVE 08/31/2021 1705   KETONESUR 5 (A) 08/31/2021 1705   PROTEINUR NEGATIVE 08/31/2021 1705   NITRITE NEGATIVE 08/31/2021 1705   LEUKOCYTESUR NEGATIVE 08/31/2021 1705   Sepsis Labs Invalid input(s): PROCALCITONIN,  WBC,   LACTICIDVEN Microbiology Recent Results (from the past 240 hour(s))  Blood culture (routine x 2)     Status: None (Preliminary result)   Collection Time: 08/31/21 11:16 AM   Specimen: BLOOD LEFT ARM  Result Value Ref Range Status   Specimen Description BLOOD LEFT ARM  Final   Special Requests   Final    BOTTLES DRAWN AEROBIC AND ANAEROBIC Blood Culture adequate volume   Culture   Final    NO GROWTH 4 DAYS Performed at [facility], [address] [place], [place] [address]    Report Status PENDING  Incomplete  Blood culture (routine x 2)     Status: None (Preliminary result)   Collection Time: 08/31/21 11:37 AM   Specimen: BLOOD  Result Value Ref Range Status   Specimen Description BLOOD LEFT ANTECUBITAL  Final   Special Requests   Final    BOTTLES DRAWN AEROBIC AND ANAEROBIC Blood Culture adequate volume   Culture   Final    NO GROWTH 4 DAYS Performed at [facility], [address] [place], [place] [address]    Report Status PENDING  Incomplete  Resp Panel by RT-PCR (Flu A&B, Covid) Nasopharyngeal Swab     Status: None   Collection Time: 08/31/21  1:03 PM   Specimen: Nasopharyngeal Swab; Nasopharyngeal(NP) swabs in vial transport medium  Result Value Ref Range Status   SARS Coronavirus 2 by RT PCR NEGATIVE NEGATIVE Final    Comment: (NOTE) SARS-CoV-2 target nucleic acids are NOT DETECTED.  The SARS-CoV-2 RNA is generally detectable in upper respiratory specimens during the acute phase of infection. The lowest concentration of SARS-CoV-2 viral copies this assay can detect is 138 copies/mL. A negative result does not preclude SARS-Cov-2 infection and should not be used as the sole basis for treatment  or other patient management decisions. A negative result may occur with  improper specimen collection/handling, submission of specimen other than nasopharyngeal swab, presence of viral mutation(s) within the areas targeted by this assay, and inadequate  number of viral copies(<138 copies/mL). A negative result must be combined with clinical observations, patient history, and epidemiological information. The expected result is Negative.  Fact Sheet for Patients:  EntrepreneurPulse.com.au  Fact Sheet for Healthcare Providers:  IncredibleEmployment.be  This test is no t yet approved or cleared by the [place] FDA and  has been authorized for detection and/or diagnosis of SARS-CoV-2 by FDA under an Emergency Use Authorization (EUA). This EUA will remain  in effect (meaning this test can be used) for the duration of the COVID-19 declaration under Section 564(b)(1) of the Act, 21 U.S.C.section 360bbb-3(b)(1), unless the authorization is terminated  or revoked sooner.       Influenza A by PCR NEGATIVE NEGATIVE Final   Influenza B by PCR NEGATIVE NEGATIVE Final    Comment: (NOTE) The Xpert Xpress SARS-CoV-2/FLU/RSV plus assay is intended as an aid in the diagnosis of influenza from Nasopharyngeal swab specimens and should not be used as a sole basis for treatment. Nasal washings and aspirates are unacceptable for Xpert Xpress SARS-CoV-2/FLU/RSV testing.  Fact Sheet for Patients: EntrepreneurPulse.com.au  Fact Sheet for Healthcare Providers: IncredibleEmployment.be  This test is not yet approved or cleared by the [place] FDA and has been authorized for detection and/or diagnosis of SARS-CoV-2 by FDA under an Emergency Use Authorization (EUA). This EUA will remain in effect (meaning this test can be used) for the duration of the COVID-19 declaration under Section 564(b)(1) of the Act, 21 U.S.C. section 360bbb-3(b)(1), unless the authorization is terminated or revoked.  Performed at [facility], [address] [place], [place] [address]   Body fluid culture w Gram Stain     Status: None   Collection Time: 08/31/21  1:24 PM   Specimen:  Peritoneal Washings; Body Fluid  Result Value Ref Range Status   Specimen Description   Final    PERITONEAL Performed at [facility], [address], [place], [place] [address]    Special Requests   Final    NONE Performed at [facility], [place], [place] [address]    Gram Stain   Final    NO SQUAMOUS EPITHELIAL CELLS SEEN FEW WBC SEEN NO ORGANISMS SEEN    Culture   Final    NO GROWTH 3 DAYS Performed at [facility], [place] [address] [place], [place] [address]    Report Status 09/04/2021 FINAL  Final     Time coordinating discharge: Over 30 minutes  SIGNED:   Wyvonnia Dusky, MD  [facility] 09/04/2021, 1:26 PM Pager   If 7PM-7AM, please contact night-coverage

## 2021-09-04 NOTE — Evaluation (Signed)
Physical Therapy Evaluation Patient Details Name: Brittany Montoya MRN: [id number] DOB: November 09, 1960 Today's Date: 09/04/2021  History of Present Illness  60 y.o. female with medical history significant of ovarian cancer on chemotherapy, liver cirrhosis with ascites, hypertension, GERD, depression with anxiety, thrombocytopenia, PE on Eliquis, who presents with abdominal pain.  Clinical Impression  Pt received sitting up in recliner, daughter in room. She states she is feeling better today and is agreeable to therapy. Pt performed all mobility with SUP. She was able to ambulate 275ft holding on IV pole for stability; fatigue reported upon completion. Daughter and husband plan to be home with pt 24/7 at d/c. Pt has no significant PT needs. PT did encourage standing activity as tolerated to prevent weakness and promote activity tolerance - pt agreeable. PT signing off - will recommend mobility specialist to follow while in acute setting. Please re-consult if pt status changes.      Recommendations for follow up therapy are one component of a multi-disciplinary discharge planning process, led by the attending physician.  Recommendations may be updated based on patient status, additional functional criteria and insurance authorization.  Follow Up Recommendations No PT follow up    Assistance Recommended at Discharge Intermittent Supervision/Assistance  Functional Status Assessment Patient has had a recent decline in their functional status and demonstrates the ability to make significant improvements in function in a reasonable and predictable amount of time.  Equipment Recommendations  None recommended by PT    Recommendations for Other Services       Precautions / Restrictions Precautions Precautions: None Restrictions Weight Bearing Restrictions: No      Mobility  Bed Mobility               General bed mobility comments: not assessed - pt in chair at beginning and end of  session    Transfers Overall transfer level: Needs assistance Equipment used: None Transfers: Sit to/from Stand Sit to Stand: Supervision                Ambulation/Gait Ambulation/Gait assistance: Supervision Gait Distance (Feet): 200 Feet Assistive device: IV Pole Gait Pattern/deviations: Step-through pattern;Wide base of support;Decreased stride length Gait velocity: decreased     General Gait Details: 2 hands on IV pole. No  LOB. SUP for safety. Able to dual task with conversation.  Stairs            Wheelchair Mobility    Modified Rankin (Stroke Patients Only)       Balance Overall balance assessment: Modified Independent (No LOB holding on IV pole)                                           Pertinent Vitals/Pain Pain Assessment: No/denies pain    Home Living Family/patient expects to be discharged to:: Private residence Living Arrangements: Spouse/significant other Available Help at Discharge: Family;Available 24 hours/day Type of Home: House Home Access: Stairs to enter Entrance Stairs-Rails: Psychiatric nurse of Steps: 6   Home Layout: One level Home Equipment: Conservation officer, nature (2 wheels);Rollator (4 wheels);Cane - single point;BSC/3in1;Wheelchair - manual;Shower seat - built in Additional Comments: Uses cane for mobility (for past month due to decreased balance). Plans to use walk-in shower at home.    Prior Function Prior Level of Function : Independent/Modified Independent  Hand Dominance        Extremity/Trunk Assessment   Upper Extremity Assessment Upper Extremity Assessment: Generalized weakness;Overall [facility] for tasks assessed    Lower Extremity Assessment Lower Extremity Assessment: Generalized weakness;Overall WFL for tasks assessed       Communication   Communication: No difficulties  Cognition Arousal/Alertness: Awake/alert Behavior During Therapy: WFL for tasks  assessed/performed Overall Cognitive Status: Within Functional Limits for tasks assessed                                          General Comments      Exercises     Assessment/Plan    PT Assessment Patient does not need any further PT services  PT Problem List Decreased strength;Decreased mobility;Decreased activity tolerance       PT Treatment Interventions      PT Goals (Current goals can be found in the Care Plan section)  Acute Rehab PT Goals Patient Stated Goal: to go home PT Goal Formulation: With patient Time For Goal Achievement: 09/18/21 Potential to Achieve Goals: Good    Frequency     Barriers to discharge        Co-evaluation               AM-PAC PT "6 Clicks" Mobility  Outcome Measure Help needed turning from your back to your side while in a flat bed without using bedrails?: None Help needed moving from lying on your back to sitting on the side of a flat bed without using bedrails?: None Help needed moving to and from a bed to a chair (including a wheelchair)?: None Help needed standing up from a chair using your arms (e.g., wheelchair or bedside chair)?: None Help needed to walk in hospital room?: A Little Help needed climbing 3-5 steps with a railing? : A Little 6 Click Score: 22    End of Session   Activity Tolerance: Patient tolerated treatment well;Patient limited by fatigue Patient left: in chair;with call bell/phone within reach;with family/visitor present Nurse Communication: Mobility status PT Visit Diagnosis: Muscle weakness (generalized) (M62.81)    Time: 1046-1100 PT Time Calculation (min) (ACUTE ONLY): 14 min   Charges:   PT Evaluation $PT Eval Moderate Complexity: 1 Mod          Kamia Insalaco PT, DPT 09/04/21 11:29 AM [phone]

## 2021-09-04 NOTE — Progress Notes (Signed)
OT Cancellation Note  Patient Details Name: Brittany Montoya MRN: [id number] DOB: 05/15/1961   Cancelled Treatment:    Reason Eval/Treat Not Completed: OT screened, no needs identified, will sign off. Order received, chart reviewed. Pt reports no N/V today, feels back to PLOF. Pt was IND in fxl mobility, ADLs, IADLs prior to admission. No OT needs identified, pt in agreement. In response to pt inquiry, therapist provided educ re: cancer support groups. Will sign off on OT.  Josiah Lobo, PhD, MS, OTR/L 09/04/21, 12:22 PM

## 2021-09-05 LAB — CULTURE, BLOOD (ROUTINE X 2)
Culture: NO GROWTH
Culture: NO GROWTH
Special Requests: ADEQUATE
Special Requests: ADEQUATE

## 2021-09-05 LAB — TOTAL BILIRUBIN, BODY FLUID: Total bilirubin, fluid: 0.9 mg/dL

## 2021-09-05 LAB — CYTOLOGY - NON PAP

## 2021-09-06 ENCOUNTER — Encounter: Payer: Self-pay | Admitting: Oncology

## 2021-09-06 LAB — HSV DNA BY PCR (REFERENCE LAB)
HSV 1 DNA: NEGATIVE
HSV 2 DNA: NEGATIVE

## 2021-09-06 NOTE — Telephone Encounter (Signed)
Please advise 

## 2021-09-07 ENCOUNTER — Encounter: Payer: Self-pay | Admitting: Emergency Medicine

## 2021-09-07 ENCOUNTER — Other Ambulatory Visit: Payer: Self-pay

## 2021-09-07 ENCOUNTER — Observation Stay
Admission: EM | Admit: 2021-09-07 | Discharge: 2021-09-08 | Disposition: A | Discharge status: Home / Self Care | Payer: BC Managed Care – PPO | Attending: Internal Medicine | Admitting: Internal Medicine

## 2021-09-07 ENCOUNTER — Ambulatory Visit
Admission: RE | Admit: 2021-09-07 | Discharge: 2021-09-07 | Disposition: A | Discharge status: Home / Self Care | Payer: BC Managed Care – PPO | Source: Home / Self Care | Attending: Oncology | Admitting: Oncology

## 2021-09-07 ENCOUNTER — Ambulatory Visit
Admission: RE | Admit: 2021-09-07 | Discharge: 2021-09-07 | Disposition: A | Discharge status: Home / Self Care | Payer: BC Managed Care – PPO | Source: Ambulatory Visit | Attending: Oncology | Admitting: Oncology

## 2021-09-07 ENCOUNTER — Inpatient Hospital Stay: Payer: BC Managed Care – PPO

## 2021-09-07 ENCOUNTER — Encounter: Payer: Self-pay | Admitting: Oncology

## 2021-09-07 ENCOUNTER — Inpatient Hospital Stay (HOSPITAL_BASED_OUTPATIENT_CLINIC_OR_DEPARTMENT_OTHER): Payer: BC Managed Care – PPO | Admitting: Oncology

## 2021-09-07 ENCOUNTER — Observation Stay: Payer: BC Managed Care – PPO

## 2021-09-07 ENCOUNTER — Ambulatory Visit: Payer: BC Managed Care – PPO

## 2021-09-07 VITALS — BP 82/65 | HR 120 | Temp 97.9°F | Ht 61.0 in

## 2021-09-07 DIAGNOSIS — E860 Dehydration: Secondary | ICD-10-CM | POA: Diagnosis not present

## 2021-09-07 DIAGNOSIS — F418 Other specified anxiety disorders: Secondary | ICD-10-CM | POA: Diagnosis present

## 2021-09-07 DIAGNOSIS — R140 Abdominal distension (gaseous): Secondary | ICD-10-CM | POA: Diagnosis present

## 2021-09-07 DIAGNOSIS — D649 Anemia, unspecified: Secondary | ICD-10-CM

## 2021-09-07 DIAGNOSIS — I10 Essential (primary) hypertension: Secondary | ICD-10-CM | POA: Diagnosis not present

## 2021-09-07 DIAGNOSIS — D72829 Elevated white blood cell count, unspecified: Secondary | ICD-10-CM | POA: Diagnosis not present

## 2021-09-07 DIAGNOSIS — C561 Malignant neoplasm of right ovary: Secondary | ICD-10-CM

## 2021-09-07 DIAGNOSIS — I2699 Other pulmonary embolism without acute cor pulmonale: Secondary | ICD-10-CM

## 2021-09-07 DIAGNOSIS — R110 Nausea: Secondary | ICD-10-CM

## 2021-09-07 DIAGNOSIS — Z79899 Other long term (current) drug therapy: Secondary | ICD-10-CM | POA: Insufficient documentation

## 2021-09-07 DIAGNOSIS — Z8543 Personal history of malignant neoplasm of ovary: Secondary | ICD-10-CM | POA: Insufficient documentation

## 2021-09-07 DIAGNOSIS — N179 Acute kidney failure, unspecified: Secondary | ICD-10-CM

## 2021-09-07 DIAGNOSIS — R112 Nausea with vomiting, unspecified: Secondary | ICD-10-CM | POA: Diagnosis not present

## 2021-09-07 DIAGNOSIS — Z20822 Contact with and (suspected) exposure to covid-19: Secondary | ICD-10-CM | POA: Diagnosis not present

## 2021-09-07 DIAGNOSIS — I959 Hypotension, unspecified: Secondary | ICD-10-CM | POA: Diagnosis not present

## 2021-09-07 DIAGNOSIS — C569 Malignant neoplasm of unspecified ovary: Secondary | ICD-10-CM

## 2021-09-07 DIAGNOSIS — F419 Anxiety disorder, unspecified: Secondary | ICD-10-CM

## 2021-09-07 DIAGNOSIS — R180 Malignant ascites: Secondary | ICD-10-CM | POA: Diagnosis not present

## 2021-09-07 DIAGNOSIS — R7401 Elevation of levels of liver transaminase levels: Secondary | ICD-10-CM

## 2021-09-07 DIAGNOSIS — E876 Hypokalemia: Secondary | ICD-10-CM

## 2021-09-07 DIAGNOSIS — D539 Nutritional anemia, unspecified: Secondary | ICD-10-CM

## 2021-09-07 DIAGNOSIS — E46 Unspecified protein-calorie malnutrition: Secondary | ICD-10-CM

## 2021-09-07 DIAGNOSIS — E8809 Other disorders of plasma-protein metabolism, not elsewhere classified: Secondary | ICD-10-CM

## 2021-09-07 DIAGNOSIS — Z7901 Long term (current) use of anticoagulants: Secondary | ICD-10-CM | POA: Diagnosis not present

## 2021-09-07 DIAGNOSIS — E871 Hypo-osmolality and hyponatremia: Secondary | ICD-10-CM | POA: Diagnosis present

## 2021-09-07 DIAGNOSIS — E669 Obesity, unspecified: Secondary | ICD-10-CM

## 2021-09-07 DIAGNOSIS — N1831 Chronic kidney disease, stage 3a: Secondary | ICD-10-CM | POA: Diagnosis present

## 2021-09-07 LAB — CBC WITH DIFFERENTIAL/PLATELET
Abs Immature Granulocytes: 1.37 10*3/uL — ABNORMAL HIGH (ref 0.00–0.07)
Basophils Absolute: 0.2 10*3/uL — ABNORMAL HIGH (ref 0.0–0.1)
Basophils Relative: 0 %
Eosinophils Absolute: 0 10*3/uL (ref 0.0–0.5)
Eosinophils Relative: 0 %
HCT: 24.4 % — ABNORMAL LOW (ref 36.0–46.0)
Hemoglobin: 8.4 g/dL — ABNORMAL LOW (ref 12.0–15.0)
Immature Granulocytes: 3 %
Lymphocytes Relative: 4 %
Lymphs Abs: 1.6 10*3/uL (ref 0.7–4.0)
MCH: 35.1 pg — ABNORMAL HIGH (ref 26.0–34.0)
MCHC: 34.4 g/dL (ref 30.0–36.0)
MCV: 102.1 fL — ABNORMAL HIGH (ref 80.0–100.0)
Monocytes Absolute: 2.1 10*3/uL — ABNORMAL HIGH (ref 0.1–1.0)
Monocytes Relative: 5 %
Neutro Abs: 36 10*3/uL — ABNORMAL HIGH (ref 1.7–7.7)
Neutrophils Relative %: 88 %
Platelets: 144 10*3/uL — ABNORMAL LOW (ref 150–400)
RBC: 2.39 MIL/uL — ABNORMAL LOW (ref 3.87–5.11)
RDW: 18.6 % — ABNORMAL HIGH (ref 11.5–15.5)
WBC: 41.2 10*3/uL — ABNORMAL HIGH (ref 4.0–10.5)
nRBC: 0.2 % (ref 0.0–0.2)

## 2021-09-07 LAB — BODY FLUID CELL COUNT WITH DIFFERENTIAL
Eos, Fluid: 0 %
Lymphs, Fluid: 2 %
Monocyte-Macrophage-Serous Fluid: 8 %
Neutrophil Count, Fluid: 90 %
Total Nucleated Cell Count, Fluid: 1791 cu mm

## 2021-09-07 LAB — COMPREHENSIVE METABOLIC PANEL
ALT: 30 U/L (ref 0–44)
AST: 53 U/L — ABNORMAL HIGH (ref 15–41)
Albumin: 2.3 g/dL — ABNORMAL LOW (ref 3.5–5.0)
Alkaline Phosphatase: 141 U/L — ABNORMAL HIGH (ref 38–126)
Anion gap: 12 (ref 5–15)
BUN: 26 mg/dL — ABNORMAL HIGH (ref 8–23)
CO2: 20 mmol/L — ABNORMAL LOW (ref 22–32)
Calcium: 9 mg/dL (ref 8.9–10.3)
Chloride: 93 mmol/L — ABNORMAL LOW (ref 98–111)
Creatinine, Ser: 2.13 mg/dL — ABNORMAL HIGH (ref 0.44–1.00)
GFR, Estimated: 26 mL/min — ABNORMAL LOW (ref >60)
Glucose, Bld: 116 mg/dL — ABNORMAL HIGH (ref 70–99)
Potassium: 3.3 mmol/L — ABNORMAL LOW (ref 3.5–5.1)
Sodium: 125 mmol/L — ABNORMAL LOW (ref 135–145)
Total Bilirubin: 0.3 mg/dL (ref 0.3–1.2)
Total Protein: 5.6 g/dL — ABNORMAL LOW (ref 6.5–8.1)

## 2021-09-07 LAB — URINALYSIS, COMPLETE (UACMP) WITH MICROSCOPIC
Bilirubin Urine: NEGATIVE
Glucose, UA: NEGATIVE mg/dL
Hgb urine dipstick: NEGATIVE
Leukocytes,Ua: NEGATIVE
Nitrite: NEGATIVE
Protein, ur: 30 mg/dL — AB
Specific Gravity, Urine: 1.015 (ref 1.005–1.030)
pH: 5 (ref 5.0–8.0)

## 2021-09-07 LAB — RESP PANEL BY RT-PCR (FLU A&B, COVID) ARPGX2
Influenza A by PCR: NEGATIVE
Influenza B by PCR: NEGATIVE
SARS Coronavirus 2 by RT PCR: NEGATIVE

## 2021-09-07 LAB — BASIC METABOLIC PANEL
Anion gap: 5 (ref 5–15)
BUN: 22 mg/dL (ref 8–23)
CO2: 20 mmol/L — ABNORMAL LOW (ref 22–32)
Calcium: 8.4 mg/dL — ABNORMAL LOW (ref 8.9–10.3)
Chloride: 103 mmol/L (ref 98–111)
Creatinine, Ser: 1.63 mg/dL — ABNORMAL HIGH (ref 0.44–1.00)
GFR, Estimated: 36 mL/min — ABNORMAL LOW (ref >60)
Glucose, Bld: 94 mg/dL (ref 70–99)
Potassium: 3.1 mmol/L — ABNORMAL LOW (ref 3.5–5.1)
Sodium: 128 mmol/L — ABNORMAL LOW (ref 135–145)

## 2021-09-07 LAB — MAGNESIUM: Magnesium: 1.1 mg/dL — ABNORMAL LOW (ref 1.7–2.4)

## 2021-09-07 LAB — PROTIME-INR
INR: 1.3 — ABNORMAL HIGH (ref 0.8–1.2)
Prothrombin Time: 16.5 seconds — ABNORMAL HIGH (ref 11.4–15.2)

## 2021-09-07 MED ORDER — ACETAMINOPHEN 500 MG PO TABS
1000.0000 mg | ORAL_TABLET | Freq: Every day | ORAL | Status: DC
  Administered 2021-09-07: 23:00:00 1000 mg via ORAL
  Filled 2021-09-07: qty 2

## 2021-09-07 MED ORDER — HEPARIN SOD (PORK) LOCK FLUSH 100 UNIT/ML IV SOLN
500.0000 [IU] | Freq: Once | INTRAVENOUS | Status: DC
  Filled 2021-09-07: qty 5

## 2021-09-07 MED ORDER — MAGNESIUM SULFATE 2 GM/50ML IV SOLN
2.0000 g | Freq: Once | INTRAVENOUS | Status: AC
  Administered 2021-09-08: 02:00:00 2 g via INTRAVENOUS
  Filled 2021-09-07: qty 50

## 2021-09-07 MED ORDER — SODIUM CHLORIDE 0.9 % IV SOLN: INTRAVENOUS | Status: DC

## 2021-09-07 MED ORDER — CLONAZEPAM 0.5 MG PO TABS
0.5000 mg | ORAL_TABLET | Freq: Two times a day (BID) | ORAL | Status: DC
  Administered 2021-09-07 – 2021-09-08 (×2): 0.5 mg via ORAL
  Filled 2021-09-07 (×2): qty 1

## 2021-09-07 MED ORDER — CLONAZEPAM 0.5 MG PO TABS: 0.5000 mg | ORAL_TABLET | Freq: Two times a day (BID) | ORAL | 0 refills | Status: DC

## 2021-09-07 MED ORDER — PROMETHAZINE HCL 25 MG RE SUPP: 25.0000 mg | Freq: Four times a day (QID) | RECTAL | 0 refills | Status: DC | PRN

## 2021-09-07 MED ORDER — ALBUMIN HUMAN 25 % IV SOLN
50.0000 g | Freq: Once | INTRAVENOUS | Status: AC
  Administered 2021-09-07: 15:00:00 50 g via INTRAVENOUS
  Filled 2021-09-07: qty 200
  Filled 2021-09-07: qty 100

## 2021-09-07 MED ORDER — SODIUM CHLORIDE 0.9 % IV SOLN
1.0000 g | Freq: Once | INTRAVENOUS | Status: AC
  Administered 2021-09-07: 14:00:00 1 g via INTRAVENOUS
  Filled 2021-09-07: qty 10

## 2021-09-07 MED ORDER — MAGNESIUM SULFATE 4 GM/100ML IV SOLN
4.0000 g | Freq: Once | INTRAVENOUS | Status: AC
Start: 2021-09-08 — End: 2021-09-08
  Administered 2021-09-08: 02:00:00 4 g via INTRAVENOUS
  Filled 2021-09-07: qty 100

## 2021-09-07 MED ORDER — LACTATED RINGERS IV BOLUS: 1000.0000 mL | Freq: Once | INTRAVENOUS | Status: DC

## 2021-09-07 MED ORDER — MIDODRINE HCL 5 MG PO TABS
5.0000 mg | ORAL_TABLET | Freq: Once | ORAL | Status: AC
  Administered 2021-09-07: 23:00:00 5 mg via ORAL
  Filled 2021-09-07: qty 1

## 2021-09-07 MED ORDER — MAGNESIUM SULFATE 50 % IJ SOLN: 6.0000 g | Freq: Once | INTRAVENOUS | Status: DC

## 2021-09-07 MED ORDER — POTASSIUM CHLORIDE CRYS ER 20 MEQ PO TBCR
40.0000 meq | EXTENDED_RELEASE_TABLET | ORAL | Status: AC
  Administered 2021-09-08 (×2): 40 meq via ORAL
  Filled 2021-09-07 (×2): qty 2

## 2021-09-07 MED ORDER — SODIUM CHLORIDE 0.9 % IV SOLN: Freq: Once | INTRAVENOUS | Status: AC

## 2021-09-07 MED ORDER — SODIUM CHLORIDE 0.9% FLUSH
10.0000 mL | Freq: Once | INTRAVENOUS | Status: AC
  Administered 2021-09-07: 11:00:00 10 mL via INTRAVENOUS
  Filled 2021-09-07: qty 10

## 2021-09-07 MED ORDER — ENSURE ENLIVE PO LIQD
237.0000 mL | Freq: Two times a day (BID) | ORAL | Status: DC
  Administered 2021-09-08 (×2): 237 mL via ORAL

## 2021-09-07 MED ORDER — SODIUM CHLORIDE 0.9 % IV SOLN
2.0000 g | INTRAVENOUS | Status: DC
  Administered 2021-09-07: 23:00:00 2 g via INTRAVENOUS
  Filled 2021-09-07: qty 20
  Filled 2021-09-07: qty 2

## 2021-09-07 MED ORDER — POTASSIUM CHLORIDE CRYS ER 20 MEQ PO TBCR
40.0000 meq | EXTENDED_RELEASE_TABLET | Freq: Once | ORAL | Status: AC
  Administered 2021-09-07: 17:00:00 40 meq via ORAL
  Filled 2021-09-07: qty 2

## 2021-09-07 NOTE — Procedures (Signed)
Pre Procedural Dx: Symptomatic Ascites Post Procedural Dx: Same  Successful US guided paracentesis yielding 4.6 L of blood tinged serous ascitic fluid  EBL: None Complications: None immediate  Ronny Bacon, MD Pager #: [phone]

## 2021-09-07 NOTE — ED Notes (Signed)
Pt moved to hospital bed and assisted to toilet

## 2021-09-07 NOTE — H&P (Addendum)
History and Physical  Brittany Montoya AYT:[id number] DOB: 1961-04-11 DOA: 09/07/2021  Referring physician:  Ruben Reason, MD PCP: Adin Hector, MD  Patient coming from: Home  Chief Complaint: Abdominal pain and vomiting  HPI: Brittany Montoya is a 60 y.o. female with medical history significant for  ovarian cancer on chemotherapy, liver cirrhosis with ascites, hypertension, GERD, depression with anxiety, thrombocytopenia, PE on Eliquis, who presents to the emergency department due to abdominal distention and low blood pressure.  Patient had a follow-up with the cancer center here at [facility], she was noted to have low blood pressure and labs done showed acute kidney injury, so she was asked to go to the ED for further evaluation and management.  She was accompanied by her husband and chief complaint of decreased oral intake, generalized weakness and vomiting x1 this morning, vomitus was nonbloody and patient complained of increased abdominal swelling and bilateral leg swelling.  She denies fever, chills, chest pain, shortness of breath, cough. Patient was admitted here from 12/16-12/20 due to severe sepsis possibly due to SBP, paracentesis was not done with 300 mL of dark serosanguineous fluid aspirated.  She was treated with IV cefepime and Flagyl and was discharged with Levaquin for 5 days.  Since discharge, patient has had a right IJ transjugular liver biopsy which is still pending.  Patient restarted Eliquis yesterday after holding this prior to liver biopsy. At baseline, patient lives independent of most ADL   ED Course:  In the emergency department, she was tachycardic, and BP on arrival was 82/65.  O2 sat was 97-100% on room air.  Work-up in the ED showed leukocytosis microcytic anemia, hyponatremia, hypokalemia, BUN/creatinine 26/2.13 (baseline creatinine 0.9-1.0).  Albumin 2.3, elevated liver enzymes.  Influenza A, B, SARS coronavirus 2 was negative. Abdominal x-ray showed  normal bowel gas pattern with no acute findings. Albumin was given, IV ceftriaxone was empirically started.  Paracentesis was ordered since patient has an outpatient paracentesis scheduled for this afternoon.  Review of Systems: Constitutional: Negative for chills and fever.  HENT: Negative for ear pain and sore throat.   Eyes: Negative for pain and visual disturbance.  Respiratory: Negative for cough, chest tightness and shortness of breath.   Cardiovascular: Negative for chest pain and palpitations.  Gastrointestinal: Positive for abdominal distention and pain, nausea and vomiting.   Endocrine: Negative for polyphagia and polyuria.  Genitourinary: Negative for decreased urine volume, dysuria, enuresis Musculoskeletal: Negative for arthralgias and back pain.  Skin: Negative for color change and rash.  Allergic/Immunologic: Negative for immunocompromised state.  Neurological: Positive for weakness.  Negative for tremors, syncope, speech difficulty Hematological: Does not bruise/bleed easily.  All other systems reviewed and are negative   Past Medical History:  Diagnosis Date   Anxiety    Arthritis    Ascites    Cirrhosis of liver (HCC)    DDD (degenerative disc disease), cervical    Depression    Family history of breast cancer    GERD (gastroesophageal reflux disease)    Glaucoma    Headache    migraines   Hypertension    Ovarian cancer ([place]) 04/15/2021   PONV (postoperative nausea and vomiting)    Pulmonary embolism ([place])    Sleep apnea    Venous stasis    Past Surgical History:  Procedure Laterality Date   ABDOMINAL HYSTERECTOMY N/A 2008   may have had left ovary removed   [place]  COLONOSCOPY     2012, 2015, 2020   COLONOSCOPY N/A 06/29/2021   Procedure: COLONOSCOPY;  Surgeon: Toledo, Benay Pike, MD;  Location: [facility] ENDOSCOPY;  Service: Gastroenterology;  Laterality: N/A;  DOCTORS ARE IN AGREEMENT THAT TOLEDO CAN DO  THIS PROCEDURE IN RUSSO'S BLOCK   ESOPHAGOGASTRODUODENOSCOPY N/A 06/29/2021   Procedure: ESOPHAGOGASTRODUODENOSCOPY (EGD);  Surgeon: Toledo, Benay Pike, MD;  Location: [facility] ENDOSCOPY;  Service: Gastroenterology;  Laterality: N/A;   PARACENTESIS  04/20/2021   PILONIDAL CYST / SINUS EXCISION     PORTACATH PLACEMENT Right 06/01/2021   Procedure: INSERTION PORT-A-CATH;  Surgeon: Herbert Pun, MD;  Location: [facility] ORS;  Service: General;  Laterality: Right;    Social History:  reports that she has never smoked. She has never used smokeless tobacco. She reports that she does not drink alcohol and does not use drugs.   Allergies  Allergen Reactions   Paclitaxel Shortness Of Breath and Other (See Comments)    pt flush, stated she didn't feel well, taxol stopped (05/10/2021)   Amlodipine Other (See Comments) and Rash    Not effective Not effective    Penicillins Rash    Family History  Problem Relation Age of Onset   Breast cancer Mother 7   Breast cancer Maternal Aunt        d. under 4     Prior to Admission medications   Medication Sig Start Date End Date Taking? Authorizing Provider  acetaminophen (TYLENOL) 500 MG tablet Take 1,000 mg by mouth at bedtime.   Yes [provider]  albuterol (VENTOLIN HFA) 108 (90 Base) MCG/ACT inhaler Inhale 1-2 puffs into the lungs every 6 (six) hours as needed for wheezing or shortness of breath. 03/03/21  Yes [provider]  apixaban (ELIQUIS) 5 MG TABS tablet Take 1 tablet (5 mg total) by mouth 2 (two) times daily. 08/24/21  Yes Earlie Server, MD  clonazePAM (KLONOPIN) 0.5 MG tablet Take 1 tablet (0.5 mg total) by mouth 2 (two) times daily. 09/07/21  Yes Earlie Server, MD  DULoxetine (CYMBALTA) 30 MG capsule Take 1 capsule (30 mg total) by mouth daily. 08/24/21  Yes Earlie Server, MD  hydrochlorothiazide (HYDRODIURIL) 12.5 MG tablet Take 12.5 mg by mouth daily. 12/03/14  Yes [provider]  hydrocortisone butyrate (LUCOID) 0.1 %  CREA cream Apply 1 application topically daily. 08/03/21  Yes Borders, Kirt Boys, NP  levofloxacin (LEVAQUIN) 750 MG tablet Take 1 tablet (750 mg total) by mouth daily for 5 days. 09/04/21 09/09/21 Yes Wyvonnia Dusky, MD  losartan (COZAAR) 50 MG tablet Take 50 mg by mouth daily. 03/16/21  Yes [provider]  Multiple Vitamin (MULTIVITAMIN WITH MINERALS) TABS tablet Take 1 tablet by mouth daily.   Yes [provider]  nystatin (MYCOSTATIN) 100000 UNIT/ML suspension Use as directed 5 mLs (500,000 Units total) in the mouth or throat 4 (four) times daily. 08/03/21  Yes Borders, Kirt Boys, NP  ondansetron (ZOFRAN) 8 MG tablet Take 1 tablet (8 mg total) by mouth 2 (two) times daily as needed for refractory nausea / vomiting. Start on day 3 after carboplatin chemo. 04/15/21  Yes Earlie Server, MD  potassium chloride SA (KLOR-CON) 20 MEQ tablet Take 1 tablet (20 mEq total) by mouth 2 (two) times daily. 07/09/21  Yes Earlie Server, MD  prochlorperazine (COMPAZINE) 10 MG tablet Take 1 tablet (10 mg total) by mouth every 6 (six) hours as needed (Nausea or vomiting). 04/15/21  Yes Earlie Server, MD  promethazine (PHENERGAN) 25 MG suppository Place  1 suppository (25 mg total) rectally every 6 (six) hours as needed for nausea or vomiting. 09/07/21  Yes Earlie Server, MD  traMADol (ULTRAM) 50 MG tablet Take 50 mg by mouth every 6 (six) hours as needed.   Yes [provider]  latanoprost (XALATAN) 0.005 % ophthalmic solution Place 1 drop into both eyes at bedtime. Patient not taking: Reported on 09/07/2021 01/06/21   [provider]  lidocaine-prilocaine (EMLA) cream Apply to affected area once Patient taking differently: Apply 1 application topically daily as needed (pain). Apply to affected area once 04/15/21   Earlie Server, MD  timolol (TIMOPTIC) 0.5 % ophthalmic solution Place 1 drop into both eyes 2 (two) times daily. Patient not taking: Reported on 09/07/2021    [provider]   triamcinolone cream (KENALOG) 0.1 % Apply 1 application topically daily as needed (irritation). Patient not taking: Reported on 09/07/2021 03/20/21   [provider]    Physical Exam: BP 92/69    Pulse 99    Temp 99.4 F (37.4 C) (Oral)    Resp (!) 25    Ht 5\' 1"  (1.549 m)    Wt 85.3 kg    SpO2 100%    BMI 35.52 kg/m   General: 60 y.o. year-old female well developed well nourished in no acute distress.  Alert and oriented x3. HEENT: NCAT, EOMI Neck: Supple, trachea medial Cardiovascular: Regular rate and rhythm with no rubs or gallops.  No thyromegaly or JVD noted.  No lower extremity edema. 2/4 pulses in all 4 extremities. Respiratory: Clear to auscultation with no wheezes or rales. Good inspiratory effort. Abdomen: Soft, nontender nondistended with normal bowel sounds x4 quadrants. Muskuloskeletal: No cyanosis, clubbing or edema noted bilaterally Neuro: CN II-XII intact, strength 5/5 x 4, sensation, reflexes intact Skin: No ulcerative lesions noted or rashes Psychiatry: Judgement and insight appear normal. Mood is appropriate for condition and setting          Labs on Admission:  Basic Metabolic Panel: Recent Labs  Lab 09/01/21 0540 09/02/21 0546 09/03/21 0435 09/04/21 0537 09/07/21 1021  NA 133* 133* 133* 132* 125*  K 3.9 3.8 3.8 3.8 3.3*  CL 102 101 101 104 93*  CO2 25 23 25 23  20*  GLUCOSE 85 105* 138* 101* 116*  BUN 15 12 15 17  26*  CREATININE 0.88 0.85 1.01* 0.89 2.13*  CALCIUM 8.0* 9.0 9.1 8.8* 9.0   Liver Function Tests: Recent Labs  Lab 09/02/21 0546 09/03/21 0435 09/04/21 0537 09/07/21 1021  AST 24 24 21  53*  ALT 17 17 14 30   ALKPHOS 147* 159* 140* 141*  BILITOT 0.8 0.6 0.6 0.3  PROT 5.7* 5.5* 4.8* 5.6*  ALBUMIN 2.5* 2.4* 2.0* 2.3*   No results for input(s): LIPASE, AMYLASE in the last 168 hours. Recent Labs  Lab 08/31/21 1912  AMMONIA 31   CBC: Recent Labs  Lab 09/02/21 1755 09/03/21 0435 09/03/21 1826 09/04/21 0537 09/07/21 1021   WBC 35.3* 32.9* 39.5* 37.2* 41.2*  NEUTROABS 31.8* 30.0* 35.6* 32.9* 36.0*  HGB 8.8* 9.0* 9.2* 8.0* 8.4*  HCT 25.5* 26.5* 26.8* 23.0* 24.4*  MCV 101.6* 100.4* 102.3* 102.2* 102.1*  PLT 28* 35* 45* 52* 144*   Cardiac Enzymes: No results for input(s): CKTOTAL, CKMB, CKMBINDEX, TROPONINI in the last 168 hours.  BNP (last 3 results) No results for input(s): BNP in the last 8760 hours.  ProBNP (last 3 results) No results for input(s): PROBNP in the last 8760 hours.  CBG: No results for input(s):  GLUCAP in the last 168 hours.  Radiological Exams on Admission: DG Abd 1 View  Result Date: 09/07/2021 CLINICAL DATA:  Nausea, bloating EXAM: ABDOMEN - 1 VIEW COMPARISON:  09/02/2021 FINDINGS: The bowel gas pattern is normal. Cholecystectomy clips. Pelvic phleboliths. Minimal spurring in the lumbar spine. IMPRESSION: Normal bowel gas pattern.  No acute findings. Electronically Signed   By: Lucrezia Europe M.D.   On: 09/07/2021 10:39    EKG: I independently viewed the EKG done and my findings are as followed: Sinus tachycardia at a rate of 102 bpm with multiform PVCs  Assessment/Plan Present on Admission:  Acute kidney injury ([place])  Hyponatremia  Essential hypertension  Ovarian cancer ([place])  Depression with anxiety  Malignant ascites  Pulmonary embolus ([place])  Principal Problem:   Acute kidney injury ([place]) Active Problems:   Essential hypertension   Malignant ascites   Ovarian cancer ([place])   Pulmonary embolus (HCC)   Depression with anxiety   Macrocytic anemia   Hyponatremia   Abdominal distention   Hypoalbuminemia due to protein-calorie malnutrition (HCC)   Hypokalemia   Transaminitis   Obesity (BMI 30-39.9)   Leukocytosis  Acute kidney injury BUN/creatinine 26/2.13 (baseline creatinine 0.9-1.0) Continue gentle hydration Renally adjust medications, avoid nephrotoxic agents/dehydration/hypotension  Abdominal distention due to malignant ascites Paracentesis  pending  Leukocytosis possibly secondary to leukemoid reaction Patient was empirically started on IV ceftriaxone for possible ongoing SBP, we shall continue with same at this time, procalcitonin will be checked with plan to de-escalate/discontinue based on findings  Hypoalbuminemia secondary to moderate protein calorie malnutrition Albumin 2.3, protein supplement to be provided  Hyponatremia Na 125, this is possibly secondary to diuretic effect Continue gentle hydration Continue to monitor sodium with serial BMPs Urine osmolality, serum osmolality and urine sodium will be checked  Hypokalemia K+ 3.3, this will be replenished  Transaminitis possibly secondary to patient's history of cirrhosis Continue to monitor liver enzymes with morning labs  Essential hypertension BP meds will be held at this time due to soft BP  Cirrhosis MELD score 26points Patient was not on any medication per med rec  Ovarian cancer Last oncology visit was today (12/23)  Pulmonary embolus Continue Eliquis  Macrocytic anemia and thrombocytopenia MCV 102.1, vitamin B12 and folate levels will be checked Platelets are 144, continue to monitor platelet levels  Depression Continue duloxetine  Obesity (BMI 35.5 to kilogram/m) Patient will be counseled on diet nursing medication when more stable    DVT prophylaxis: Eliquis  Code Status: Full code  Family Communication: Husband at bedside (all questions answered to satisfaction)  Disposition Plan:  Patient is from:                        home Anticipated DC to:                   SNF or family members home Anticipated DC date:               2-3 days Anticipated DC barriers:          Patient requires inpatient management due to severity of symptoms  Consults called: IR   Admission status: Observation    Bernadette Hoit MD [facility]  09/07/2021, 3:18 PM

## 2021-09-07 NOTE — ED Notes (Signed)
Request made for transport to the floor ?

## 2021-09-07 NOTE — ED Notes (Signed)
Pt ambulatory to the restroom with standby assistance.  °

## 2021-09-07 NOTE — ED Notes (Addendum)
Pt presents to the ED for bilateral leg swelling, abd swelling, SOB, and nausea pt has in and out of the hospital for the swelling pt states that the past 4 weeks she needed paracentesis done for this reason. Pt has a hx of ovarian cancer and currently on chemotherapy at this time. Pt is A&Ox4 and NAD.

## 2021-09-07 NOTE — ED Notes (Signed)
Transportation requested  

## 2021-09-07 NOTE — Progress Notes (Signed)
Pt going to ED. Port remains accessed in R chest for the ED. Dr Tasia Catchings notified ED.

## 2021-09-07 NOTE — Progress Notes (Signed)
Pt complains of abdominal pain and was seen in the ed 09/04/21.

## 2021-09-07 NOTE — ED Provider Notes (Signed)
Emergency Medicine Provider Triage Evaluation Note  Brittany Montoya , a 60 y.o. female  was evaluated in triage.  Pt complains of urinary retention, patient has ovarian cancer and is undergoing chemotherapy.  No known mets per the patient.  No fever or chills.  Was sent here from cancer center..  Review of Systems  Positive: Urinary retention Negative: Chest pain, shortness of breath  Physical Exam  BP (!) 80/69 (BP Location: Left Arm)    Pulse 60    Temp 99.4 F (37.4 C) (Oral)    Resp 15    Ht 5\' 1"  (1.549 m)    Wt 85.3 kg    SpO2 97%    BMI 35.52 kg/m  Gen:   Awake, no distress   Resp:  Normal effort  MSK:   Moves extremities without difficulty  Other:    Medical Decision Making  Medically screening exam initiated at 12:12 PM.  Appropriate orders placed.  Brittany Montoya was informed that the remainder of the evaluation will be completed by another provider, this initial triage assessment does not replace that evaluation, and the importance of remaining in the ED until their evaluation is complete.  Patient does have a port   Versie Starks, PA-C 09/07/21 1214    Vanessa Bronson, MD 09/07/21 [phone]

## 2021-09-07 NOTE — ED Provider Notes (Signed)
[facility] Emergency Department Provider Note ____________________________________________   Event Date/Time   First MD Initiated Contact with Patient 09/07/21 1243     (approximate)  I have reviewed the triage vital signs and the nursing notes.  HISTORY  Chief Complaint Emesis and Abdominal Pain   HPI Brittany Montoya is a 60 y.o. femalewho presents to the ED for evaluation of abdominal distention and hypotension  Chart review indicates history of ovarian cancer on chemo, cirrhosis with ascites, PE on Eliquis.  Admission within the past 2 weeks here due to abdominal pain and ascites, rule out SBP.  Discharged home with, discharged 3 days ago.  Patient presents to the ED with her husband from the neighboring cancer center due to hypotension in the clinic as well as abnormal labs this morning from his clinic visit.  She apparently was hypotensive at the cancer center and had blood work demonstrating AKI and was referred to the ED for evaluation and possible admission.  She had a outpatient paracentesis scheduled for this afternoon.  She reports feeling "just awful" with generalized weakness, poor p.o. intake.  She had 1 episodes of nonbloody emesis this morning, but denies any diarrhea.  Reports poor intake and poor output.  Reports dark urine that seems like it might be foul-smelling, but denies any dysuria.  Reports increasing swelling to her abdomen and bilateral lower extremities.  Denies cough, chest pain.   After she was discharged 3 days ago, she went to [facility] and had a right IJ transjugular liver biopsy which is pending.  She had passed her Eliquis for couple days, but restarted that yesterday and has taken 2 or 3 doses.  Past Medical History:  Diagnosis Date   Anxiety    Arthritis    Ascites    Cirrhosis of liver ([place])    DDD (degenerative disc disease), cervical    Depression    Family history of breast cancer    GERD (gastroesophageal  reflux disease)    Glaucoma    Headache    migraines   Hypertension    Ovarian cancer ([place]) 04/15/2021   PONV (postoperative nausea and vomiting)    Pulmonary embolism (H[facility])    Sleep apnea    Venous stasis     Patient Active Problem List   Diagnosis Date Noted   Nausea & vomiting    Abdominal pain 08/31/2021   Normocytic anemia 08/31/2021   Thrombocytopenia ([place]) 08/31/2021   Sepsis ([place]) 08/31/2021   Hyponatremia 08/31/2021   SBP (spontaneous bacterial peritonitis) ([place]) 08/31/2021   Genetic testing 07/10/2021   Symptomatic anemia 07/05/2021   Chemotherapy induced neutropenia ([place]) 06/15/2021   Infusion reaction 05/10/2021   Family history of breast cancer 04/27/2021   Goals of care, counseling/discussion 04/19/2021   Pulmonary embolus ([place]) 04/19/2021   Encounter for antineoplastic chemotherapy 04/19/2021   Depression with anxiety 04/19/2021   Ovarian cancer ([place]) 04/15/2021   Malignant ascites 04/11/2021   Other cirrhosis of liver ([place]) 04/11/2021   DDD (degenerative disc disease), cervical 03/02/2019   Glaucoma (increased eye pressure) 03/02/2019   Venous stasis 03/02/2019   Facet arthritis of cervical region 04/01/2016   Incomplete tear of left rotator cuff 01/15/2016   Rotator cuff tendinitis, left 01/15/2016   Essential hypertension 10/02/2015   Obstructive sleep apnea syndrome 10/02/2015   Recurrent major depressive disorder, in full remission ([place]) 10/02/2015   Contusion of left knee 10/17/2014    Past Surgical History:  Procedure Laterality Date   ABDOMINAL HYSTERECTOMY  N/A 2008   may have had left ovary removed   [place]     2012, 2015, 2020   COLONOSCOPY N/A 06/29/2021   Procedure: COLONOSCOPY;  Surgeon: Toledo, Benay Pike, MD;  Location: [facility] ENDOSCOPY;  Service: Gastroenterology;  Laterality: N/A;  DOCTORS ARE IN AGREEMENT THAT TOLEDO CAN DO THIS PROCEDURE IN RUSSO'S BLOCK    ESOPHAGOGASTRODUODENOSCOPY N/A 06/29/2021   Procedure: ESOPHAGOGASTRODUODENOSCOPY (EGD);  Surgeon: Toledo, Benay Pike, MD;  Location: [facility] ENDOSCOPY;  Service: Gastroenterology;  Laterality: N/A;   PARACENTESIS  04/20/2021   PILONIDAL CYST / SINUS EXCISION     PORTACATH PLACEMENT Right 06/01/2021   Procedure: INSERTION PORT-A-CATH;  Surgeon: Herbert Pun, MD;  Location: [facility] ORS;  Service: General;  Laterality: Right;    Prior to Admission medications   Medication Sig Start Date End Date Taking? Authorizing Provider  acetaminophen (TYLENOL) 500 MG tablet Take 1,000 mg by mouth at bedtime.    [provider]  albuterol (VENTOLIN HFA) 108 (90 Base) MCG/ACT inhaler Inhale 1-2 puffs into the lungs every 6 (six) hours as needed for wheezing or shortness of breath. 03/03/21   [provider]  apixaban (ELIQUIS) 5 MG TABS tablet Take 1 tablet (5 mg total) by mouth 2 (two) times daily. 08/24/21   Earlie Server, MD  clonazePAM (KLONOPIN) 0.5 MG tablet Take 1 tablet (0.5 mg total) by mouth 2 (two) times daily. 09/07/21   Earlie Server, MD  DULoxetine (CYMBALTA) 30 MG capsule Take 1 capsule (30 mg total) by mouth daily. 08/24/21   Earlie Server, MD  hydrochlorothiazide (HYDRODIURIL) 12.5 MG tablet Take 12.5 mg by mouth daily. 12/03/14   [provider]  hydrocortisone butyrate (LUCOID) 0.1 % CREA cream Apply 1 application topically daily. 08/03/21   Borders, Kirt Boys, NP  latanoprost (XALATAN) 0.005 % ophthalmic solution Place 1 drop into both eyes at bedtime. Patient not taking: Reported on 09/07/2021 01/06/21   [provider]  levofloxacin (LEVAQUIN) 750 MG tablet Take 1 tablet (750 mg total) by mouth daily for 5 days. 09/04/21 09/09/21  Wyvonnia Dusky, MD  lidocaine-prilocaine (EMLA) cream Apply to affected area once Patient taking differently: Apply 1 application topically daily as needed (pain). Apply to affected area once 04/15/21   Earlie Server, MD  losartan (COZAAR) 50 MG  tablet Take 50 mg by mouth daily. 03/16/21   [provider]  Multiple Vitamin (MULTIVITAMIN WITH MINERALS) TABS tablet Take 1 tablet by mouth daily. Patient not taking: Reported on 09/07/2021    [provider]  nystatin (MYCOSTATIN) 100000 UNIT/ML suspension Use as directed 5 mLs (500,000 Units total) in the mouth or throat 4 (four) times daily. 08/03/21   Borders, Kirt Boys, NP  ondansetron (ZOFRAN) 8 MG tablet Take 1 tablet (8 mg total) by mouth 2 (two) times daily as needed for refractory nausea / vomiting. Start on day 3 after carboplatin chemo. 04/15/21   Earlie Server, MD  potassium chloride SA (KLOR-CON) 20 MEQ tablet Take 1 tablet (20 mEq total) by mouth 2 (two) times daily. 07/09/21   Earlie Server, MD  prochlorperazine (COMPAZINE) 10 MG tablet Take 1 tablet (10 mg total) by mouth every 6 (six) hours as needed (Nausea or vomiting). Patient not taking: Reported on 09/07/2021 04/15/21   Earlie Server, MD  promethazine (PHENERGAN) 25 MG suppository Place 1 suppository (25 mg total) rectally every 6 (six) hours as needed for nausea or vomiting. 09/07/21  Earlie Server, MD  timolol (TIMOPTIC) 0.5 % ophthalmic solution Place 1 drop into both eyes 2 (two) times daily.    [provider]  traMADol (ULTRAM) 50 MG tablet Take 50 mg by mouth every 6 (six) hours as needed.    [provider]  triamcinolone cream (KENALOG) 0.1 % Apply 1 application topically daily as needed (irritation). 03/20/21   [provider]    Allergies Paclitaxel, Amlodipine, and Penicillins  Family History  Problem Relation Age of Onset   Breast cancer Mother 62   Breast cancer Maternal Aunt        d. under 87    Social History Social History   Tobacco Use   Smoking status: Never   Smokeless tobacco: Never  Vaping Use   Vaping Use: Never used  Substance Use Topics   Alcohol use: No   Drug use: No    Review of Systems  Constitutional: No fever/chills Eyes: No visual changes. ENT: No  sore throat. Cardiovascular: Denies chest pain. Respiratory: Denies shortness of breath. Gastrointestinal: Positive for abdominal pain, nausea and emesis.  No diarrhea.  No constipation. Genitourinary: Negative for dysuria. Musculoskeletal: Negative for back pain. Skin: Negative for rash. Neurological: Negative for headaches, focal weakness or numbness. ____________________________________________   PHYSICAL EXAM:  VITAL SIGNS: Vitals:   09/07/21 1208  BP: (!) 80/69  Pulse: 60  Resp: 15  Temp: 99.4 F (37.4 C)  SpO2: 97%     Constitutional: Alert and oriented. Well appearing and in no acute distress. Eyes: Conjunctivae are normal. PERRL. EOMI. Head: Atraumatic. Nose: No congestion/rhinnorhea. Mouth/Throat: Mucous membranes are dry.  Oropharynx non-erythematous. Neck: No stridor. No cervical spine tenderness to palpation. Cardiovascular: Normal rate, regular rhythm. Grossly normal heart sounds.  Good peripheral circulation. Respiratory: Normal respiratory effort.  No retractions. Lungs CTAB. Gastrointestinal: Soft , distended and diffusely tender with voluntary guarding. Musculoskeletal:   No joint effusions. No signs of acute trauma. Pitting edema bilaterally and symmetrically without overlying skin changes Neurologic:  Normal speech and language. No gross focal neurologic deficits are appreciated. No gait instability noted. Skin:  Skin is warm, dry and intact. No rash noted. Psychiatric: Mood and affect are normal. Speech and behavior are normal.  ____________________________________________   LABS (all labs ordered are listed, but only abnormal results are displayed)  Labs Reviewed  RESP PANEL BY RT-PCR (FLU A&B, COVID) ARPGX2  PROTIME-INR  URINALYSIS, COMPLETE (UACMP) WITH MICROSCOPIC   ____________________________________________  12 Lead EKG  Sinus rhythm, rate of 102 bpm.  Normal axis and intervals.  Some PACs.  No  STEMI. ____________________________________________  RADIOLOGY  ED MD interpretation:    Official radiology report(s): DG Abd 1 View  Result Date: 09/07/2021 CLINICAL DATA:  Nausea, bloating EXAM: ABDOMEN - 1 VIEW COMPARISON:  09/02/2021 FINDINGS: The bowel gas pattern is normal. Cholecystectomy clips. Pelvic phleboliths. Minimal spurring in the lumbar spine. IMPRESSION: Normal bowel gas pattern.  No acute findings. Electronically Signed   By: Lucrezia Europe M.D.   On: 09/07/2021 10:39    ____________________________________________   PROCEDURES and INTERVENTIONS  Procedure(s) performed (including Critical Care):  .1-3 Lead EKG Interpretation Performed by: Vladimir Crofts, MD Authorized by: Vladimir Crofts, MD     Interpretation: normal     ECG rate:  80   ECG rate assessment: normal     Rhythm: sinus rhythm     Ectopy: none     Conduction: normal    Medications  albumin human 25 % solution 50 g (has no  administration in time range)  cefTRIAXone (ROCEPHIN) 1 g in sodium chloride 0.9 % 100 mL IVPB (has no administration in time range)    ____________________________________________   MDM / ED COURSE   60 year old female undergoing chemotherapy for ovarian cancer presents to the ED with evidence of dehydration and AKI requiring medical admission.  Soft pressures on arrival, improving with IV fluids.  She appears volume overloaded and I think she would benefit from albumin considering her blood work from this morning that I review.  AKI is noted.  She would benefit from a paracentesis as well.  I think she would benefit from a medical observation admission to facilitate all of this.  We will cover for SBP with Rocephin.  No evidence of sepsis or systemic illness.     ____________________________________________   FINAL CLINICAL IMPRESSION(S) / ED DIAGNOSES  Final diagnoses:  Abdominal distension  AKI (acute kidney injury) ([place])  Malignant ascites     ED Discharge Orders      None        Roslyn Else   Note:  This document was prepared using Set designer software and may include unintentional dictation errors.    Vladimir Crofts, MD 09/07/21 1329

## 2021-09-07 NOTE — ED Notes (Signed)
Pt still at [place] at this time.

## 2021-09-07 NOTE — ED Notes (Signed)
[name], NP made aware of patient's VS, pt with a yellow MEWS score, IVF infusion ordered and running at this time. Per [name], NP, pt okay to go to the floor at this time.

## 2021-09-07 NOTE — ED Notes (Signed)
Dinner tray given at this time.  

## 2021-09-07 NOTE — ED Notes (Signed)
Patient transported to Ultrasound 

## 2021-09-07 NOTE — ED Triage Notes (Addendum)
Pt comes into the ED via POV c/o abd discomfort and N/V.  Pt was admitted to the hospital last week and was d/c on Tuesday where she then f/u with [facility] on Wednesday and had a liver biopsy.  Pt now has swelling, decreased urination output, N/V.  Pt currently in NAD at this time with even and unlabored respirations.  Pt from the cancer center with her PAC accessed. Pt was also scheduled for a paracentesis this afternoon at 14:00.

## 2021-09-08 ENCOUNTER — Encounter: Payer: Self-pay | Admitting: Oncology

## 2021-09-08 DIAGNOSIS — N179 Acute kidney failure, unspecified: Secondary | ICD-10-CM | POA: Diagnosis not present

## 2021-09-08 DIAGNOSIS — R140 Abdominal distension (gaseous): Secondary | ICD-10-CM | POA: Diagnosis not present

## 2021-09-08 DIAGNOSIS — R180 Malignant ascites: Secondary | ICD-10-CM | POA: Diagnosis not present

## 2021-09-08 DIAGNOSIS — D649 Anemia, unspecified: Secondary | ICD-10-CM | POA: Diagnosis not present

## 2021-09-08 LAB — CBC
HCT: 20.3 % — ABNORMAL LOW (ref 36.0–46.0)
Hemoglobin: 6.9 g/dL — ABNORMAL LOW (ref 12.0–15.0)
MCH: 34.7 pg — ABNORMAL HIGH (ref 26.0–34.0)
MCHC: 34 g/dL (ref 30.0–36.0)
MCV: 102 fL — ABNORMAL HIGH (ref 80.0–100.0)
Platelets: 150 10*3/uL (ref 150–400)
RBC: 1.99 MIL/uL — ABNORMAL LOW (ref 3.87–5.11)
RDW: 18.5 % — ABNORMAL HIGH (ref 11.5–15.5)
WBC: 31.5 10*3/uL — ABNORMAL HIGH (ref 4.0–10.5)
nRBC: 0.1 % (ref 0.0–0.2)

## 2021-09-08 LAB — COMPREHENSIVE METABOLIC PANEL
ALT: 17 U/L (ref 0–44)
AST: 31 U/L (ref 15–41)
Albumin: 2.6 g/dL — ABNORMAL LOW (ref 3.5–5.0)
Alkaline Phosphatase: 111 U/L (ref 38–126)
Anion gap: 5 (ref 5–15)
BUN: 21 mg/dL (ref 8–23)
CO2: 22 mmol/L (ref 22–32)
Calcium: 8.7 mg/dL — ABNORMAL LOW (ref 8.9–10.3)
Chloride: 101 mmol/L (ref 98–111)
Creatinine, Ser: 1.43 mg/dL — ABNORMAL HIGH (ref 0.44–1.00)
GFR, Estimated: 42 mL/min — ABNORMAL LOW (ref >60)
Glucose, Bld: 100 mg/dL — ABNORMAL HIGH (ref 70–99)
Potassium: 3.7 mmol/L (ref 3.5–5.1)
Sodium: 128 mmol/L — ABNORMAL LOW (ref 135–145)
Total Bilirubin: 0.4 mg/dL (ref 0.3–1.2)
Total Protein: 4.9 g/dL — ABNORMAL LOW (ref 6.5–8.1)

## 2021-09-08 LAB — PREPARE RBC (CROSSMATCH)

## 2021-09-08 LAB — MAGNESIUM: Magnesium: 2.8 mg/dL — ABNORMAL HIGH (ref 1.7–2.4)

## 2021-09-08 LAB — OSMOLALITY, URINE: Osmolality, Ur: 294 mOsm/kg — ABNORMAL LOW (ref 300–900)

## 2021-09-08 LAB — VITAMIN B12: Vitamin B-12: 1419 pg/mL — ABNORMAL HIGH (ref 180–914)

## 2021-09-08 LAB — PROCALCITONIN: Procalcitonin: 0.33 ng/mL

## 2021-09-08 LAB — FOLATE: Folate: 20.3 ng/mL (ref >5.9)

## 2021-09-08 LAB — PHOSPHORUS: Phosphorus: 3.6 mg/dL (ref 2.5–4.6)

## 2021-09-08 LAB — OSMOLALITY: Osmolality: 270 mOsm/kg — ABNORMAL LOW (ref 275–295)

## 2021-09-08 LAB — SODIUM, URINE, RANDOM: Sodium, Ur: <10 mmol/L

## 2021-09-08 MED ORDER — DULOXETINE HCL 30 MG PO CPEP
30.0000 mg | ORAL_CAPSULE | Freq: Every day | ORAL | Status: DC
  Filled 2021-09-08: qty 1

## 2021-09-08 MED ORDER — ADULT MULTIVITAMIN W/MINERALS CH
1.0000 | ORAL_TABLET | Freq: Every day | ORAL | Status: DC
  Filled 2021-09-08: qty 1

## 2021-09-08 MED ORDER — TRAMADOL HCL 50 MG PO TABS: 50.0000 mg | ORAL_TABLET | Freq: Four times a day (QID) | ORAL | Status: DC | PRN

## 2021-09-08 MED ORDER — HEPARIN SOD (PORK) LOCK FLUSH 100 UNIT/ML IV SOLN
500.0000 [IU] | Freq: Once | INTRAVENOUS | Status: AC
Start: 2021-09-08 — End: 2021-09-08
  Administered 2021-09-08: 17:00:00 500 [IU] via INTRAVENOUS
  Filled 2021-09-08: qty 5

## 2021-09-08 MED ORDER — APIXABAN 5 MG PO TABS
5.0000 mg | ORAL_TABLET | Freq: Two times a day (BID) | ORAL | Status: DC
  Administered 2021-09-08: 10:00:00 5 mg via ORAL
  Filled 2021-09-08: qty 1

## 2021-09-08 MED ORDER — SODIUM CHLORIDE 0.9% IV SOLUTION: Freq: Once | INTRAVENOUS | Status: AC

## 2021-09-08 MED ORDER — LEVOFLOXACIN 500 MG PO TABS: 500.0000 mg | ORAL_TABLET | Freq: Every day | ORAL | 0 refills | Status: AC

## 2021-09-08 MED ORDER — ENSURE ENLIVE PO LIQD: 237.0000 mL | Freq: Two times a day (BID) | ORAL | 12 refills | Status: AC

## 2021-09-08 MED ORDER — ACETAMINOPHEN 325 MG PO TABS
650.0000 mg | ORAL_TABLET | Freq: Once | ORAL | Status: AC
  Administered 2021-09-08: 10:00:00 650 mg via ORAL
  Filled 2021-09-08: qty 2

## 2021-09-08 NOTE — TOC Transition Note (Signed)
Transition of Care [facility]) - CM/SW Discharge Note   Patient Details  Name: Brittany Montoya MRN: [id number] Date of Birth: Jul 05, 1961  [facility] CM/SW Contact:  Izola Price, RN Phone Number: 09/08/2021, 3:01 PM   Clinical Narrative:  Admitted 12/23 in OBS status.  Patient now has discharge orders for today 09/08/21. DC Home with self care. Simmie Davies RN CM     Final next level of care: Home/Self Care Barriers to Discharge: Barriers Resolved   Patient Goals and CMS Choice     Choice offered to / list presented to : NA  Discharge Placement                       Discharge Plan and Services                DME Arranged: N/A DME Agency: NA       HH Arranged: NA HH Agency: NA        Social Determinants of Health (SDOH) Interventions     Readmission Risk Interventions No flowsheet data found.

## 2021-09-08 NOTE — Progress Notes (Signed)
Hematology/Oncology progress note    Patient Care Team: Adin Hector, MD as PCP - General (Internal Medicine) Earlie Server, MD as Consulting Physician (Hematology and Oncology) Clent Jacks, RN as Oncology Nurse Navigator  REFERRING PROVIDER: Adin Hector, MD  CHIEF COMPLAINTS/REASON FOR VISIT:  Malignant ascites, ovarian neoplasm, pulmonary embolism  HISTORY OF PRESENTING ILLNESS:   Brittany Montoya is a  60 y.o.  female with PMH listed below was seen in consultation at the request of  Adin Hector, MD  for evaluation of complicated ovarian cyst, ascites  Patient has noticed generalized abdominal distention and bloating, nausea and constipation for the past months and  03/23/2021, CT abdomen pelvis with contrast showed large solid and cystic right ovarian mass measuring 14.1 x 18.1 x 16 cm highly suspicious for primary ovarian malignancy.  Evidence of peritoneal spread.  Moderate associated ascites. Slightly small liver with mild nodular contour suggesting a degree of cirrhosis.  04/03/2021, CA125 70.9, Hg 4 400 09.  04/04/2021, was seen by gynecology Dr. Ouida Sills.  And was referred to establish care with GYN oncology. 04/09/2021, patient underwent paracentesis and had 6.3 L of hazy yellow fluid removed.  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 Theora Gianotti and me.  Case was discussed on Gynonc tumor board on 04/25/2021. Consensus reached upon clinical diagnosis of locally advanced Stage II/III Ovarian Cancer, plan neoadjuvant chemotherapy followed by debulking surgery.   Patient also has noticed bilateral lower extremity edema, progressively worsening.  Fatigued, shortness of breath with exertion.  Denies any chest pain, cough.  Poor oral intake due to decreased appetite She was accompanied by her husband. Denies any alcohol use or previous hepatitis infection.  She is not aware about cirrhosis. She reports some  symptom relief after the paracentesis.  Her abdomen seems to got better and getting worse again.  04/19/21- carbo AUC 5- taxol 135 mg/m2 05/10/21- carbo AUC 6 - taxol 150 mg/m2; reaction to taxol- discontinued 06/07/21- carbo AUC 6 - abraxane 260 mg/m2  # CA125/CEA ratio is <25,  06/29/2021 colonoscopy showed non bleeding internal hemorrhoids. Diverticulosis,  Distal transverse colon 4 mm polyp, resected and retrieved.-Pathology showed polypoid colonic mucosa with intramucosal lymphoid aggregate.  Negative for malignancy and dysplasia. Upper endoscopy showed esophageal mucosal changes suspicious for eosinophilic esophagitis.  Biopsied-pathology showed reflux esophagitis.  Negative for increased eosinophils..  Benign-appearing esophageal stenosis.  Hiatal hernia. 06/01/2021, patient has US abdomen paracentesis and had 4 L of fluid removed.  #06/25/2021 1. Redemonstrated large, mixed solid and cystic mass of the right ovary, which is slightly diminished in size. 2. Peritoneal and omental thickening and nodularity is improved compared to prior examination, although still present. 3. Findings are consistent with treatment response of primary ovarian malignancy and peritoneal and omental metastatic disease. 4. Moderate volume ascites throughout the abdomen and pelvis, similar in volume to prior. 5. No evidence of metastatic disease in the chest.  06/26/2021 therapeutic paracentesis.    06/27/2021 seen by Dr.Secord. patient is deconditioned. Dr.Secord recommend to continue neoadjuvant chemotherapy and optimize her health status with physical therapy and dietitian.   07/23/2021, ultrasound guided paracentesis, 4.1 L removed.    INTERVAL HISTORY Brittany Montoya is a 60 y.o. female who has above history reviewed by me today presents for follow up visit for nausea vomiting, abdominal pain, history of malignant ascites, ovarian neoplasm, pulmonary embolism Patient was accompanied by one of her  daughter today.  She  was discharged on 12/20, and had liver biopsy on 12/21.  # Liver biopsy at [facility] showed mild Kupffer cells and hepatocellular iron accumulation, 1+ of 4. No significant inflammation. Today she called office complaining nausea, vomiting and she is not able to keep food down.  She has tried Reglan and is concerned that she can combine Reglan and tramadol together. Continues to have abdominal pain due to not taking tramadol.  She also reports difficulty passing urine, last time she went to bathroom was around 6AM today.  Her abdomen is distended.  No fever, chills.    Review of Systems  Constitutional:  Positive for appetite change and fatigue. Negative for chills and fever.  HENT:   Negative for hearing loss and voice change.   Eyes:  Negative for eye problems.  Respiratory:  Positive for shortness of breath. Negative for chest tightness and cough.   Cardiovascular:  Negative for chest pain.  Gastrointestinal:  Positive for abdominal distention, abdominal pain, constipation, nausea and vomiting. Negative for blood in stool.  Endocrine: Negative for hot flashes.  Genitourinary:  Negative for difficulty urinating and frequency.   Musculoskeletal:  Negative for arthralgias.  Skin:  Negative for itching and rash.  Neurological:  Negative for extremity weakness.  Hematological:  Negative for adenopathy.  Psychiatric/Behavioral:  Negative for confusion. The patient is not nervous/anxious.    MEDICAL HISTORY:  Past Medical History:  Diagnosis Date   Anxiety    Arthritis    Ascites    Cirrhosis of liver ([place])    DDD (degenerative disc disease), cervical    Depression    Family history of breast cancer    GERD (gastroesophageal reflux disease)    Glaucoma    Headache    migraines   Hypertension    Ovarian cancer ([place]) 04/15/2021   PONV (postoperative nausea and vomiting)    Pulmonary embolism ([place])    Sleep apnea    Venous stasis     SURGICAL HISTORY: Past  Surgical History:  Procedure Laterality Date   ABDOMINAL HYSTERECTOMY N/A 2008   may have had left ovary removed   [place]     2012, 2015, 2020   COLONOSCOPY N/A 06/29/2021   Procedure: COLONOSCOPY;  Surgeon: Toledo, Benay Pike, MD;  Location: [facility] ENDOSCOPY;  Service: Gastroenterology;  Laterality: N/A;  DOCTORS ARE IN AGREEMENT THAT TOLEDO CAN DO THIS PROCEDURE IN RUSSO'S BLOCK   ESOPHAGOGASTRODUODENOSCOPY N/A 06/29/2021   Procedure: ESOPHAGOGASTRODUODENOSCOPY (EGD);  Surgeon: Toledo, Benay Pike, MD;  Location: [facility] ENDOSCOPY;  Service: Gastroenterology;  Laterality: N/A;   PARACENTESIS  04/20/2021   PILONIDAL CYST / SINUS EXCISION     PORTACATH PLACEMENT Right 06/01/2021   Procedure: INSERTION PORT-A-CATH;  Surgeon: Herbert Pun, MD;  Location: [facility] ORS;  Service: General;  Laterality: Right;    SOCIAL HISTORY: Social History   Socioeconomic History   Marital status: Married    Spouse name: Barnabas Lister   Number of children: 2   Years of education: Not on file   Highest education level: Not on file  Occupational History   Not on file  Tobacco Use   Smoking status: Never   Smokeless tobacco: Never  Vaping Use   Vaping Use: Never used  Substance and Sexual Activity   Alcohol use: No   Drug use: No   Sexual activity: Not Currently    Birth control/protection: Surgical  Other Topics Concern   Not on  file  Social History Narrative   Not on file   Social Determinants of Health   Financial Resource Strain: Not on file  Food Insecurity: Not on file  Transportation Needs: Not on file  Physical Activity: Not on file  Stress: Not on file  Social Connections: Not on file  Intimate Partner Violence: Not on file    FAMILY HISTORY: Family History  Problem Relation Age of Onset   Breast cancer Mother 59   Breast cancer Maternal Aunt        d. under 7    ALLERGIES:  is allergic to paclitaxel, amlodipine,  and penicillins.  MEDICATIONS:  No current facility-administered medications for this visit.   Current Outpatient Medications  Medication Sig Dispense Refill   feeding supplement (ENSURE ENLIVE / ENSURE PLUS) LIQD Take 237 mLs by mouth 2 (two) times daily between meals. 14220 mL 12   [START ON 09/09/2021] levofloxacin (LEVAQUIN) 500 MG tablet Take 1 tablet (500 mg total) by mouth daily for 7 days. 7 tablet 0   Facility-Administered Medications Ordered in Other Visits  Medication Dose Route Frequency Provider Last Rate Last Admin   0.9 %  sodium chloride infusion (Manually program via Guardrails IV Fluids)   Intravenous Once Loletha Grayer, MD       acetaminophen (TYLENOL) tablet 1,000 mg  1,000 mg Oral QHS Sharion Settler, NP   1,000 mg at 09/07/21 2301   apixaban (ELIQUIS) tablet 5 mg  5 mg Oral BID Adefeso, Oladapo, DO   5 mg at 09/08/21 1027   cefTRIAXone (ROCEPHIN) 2 g in sodium chloride 0.9 % 100 mL IVPB  2 g Intravenous Q24H Adefeso, Oladapo, DO   Stopped at 09/07/21 2342   clonazePAM (KLONOPIN) tablet 0.5 mg  0.5 mg Oral BID Sharion Settler, NP   0.5 mg at 09/08/21 1026   DULoxetine (CYMBALTA) DR capsule 30 mg  30 mg Oral Daily Adefeso, Oladapo, DO       feeding supplement (ENSURE ENLIVE / ENSURE PLUS) liquid 237 mL  237 mL Oral BID BM Adefeso, Oladapo, DO   237 mL at 09/08/21 1028   LORazepam (ATIVAN) injection 0.5 mg  0.5 mg Intravenous Once PRN Earlie Server, MD       multivitamin with minerals tablet 1 tablet  1 tablet Oral Daily Wieting, Richard, MD       traMADol Veatrice Bourbon) tablet 50 mg  50 mg Oral Q6H PRN Wieting, Richard, MD         PHYSICAL EXAMINATION: ECOG PERFORMANCE STATUS: 2 - Symptomatic, <50% confined to bed Vitals:   09/07/21 1043  BP: (!) 82/65  Pulse: (!) 120  Temp: 97.9 F (36.6 C)  SpO2: 100%    There were no vitals filed for this visit.   Physical Exam Constitutional:      General: She is not in acute distress.    Appearance: She is obese.  HENT:      Head: Normocephalic and atraumatic.  Eyes:     General: No scleral icterus. Cardiovascular:     Rate and Rhythm: Regular rhythm. Tachycardia present.     Heart sounds: Normal heart sounds.  Pulmonary:     Effort: Pulmonary effort is normal. No respiratory distress.     Breath sounds: Normal breath sounds. No wheezing.  Abdominal:     General: Bowel sounds are normal. There is distension.     Palpations: Abdomen is soft.  Musculoskeletal:        General: No deformity. Normal range of motion.  Cervical back: Normal range of motion and neck supple.  Skin:    General: Skin is warm and dry.     Findings: No erythema or rash.  Neurological:     Mental Status: She is alert and oriented to person, place, and time. Mental status is at baseline.     Cranial Nerves: No cranial nerve deficit.     Coordination: Coordination normal.  Psychiatric:        Mood and Affect: Mood normal.    LABORATORY DATA:  I have reviewed the data as listed Lab Results  Component Value Date   WBC 31.5 (H) 09/08/2021   HGB 6.9 (L) 09/08/2021   HCT 20.3 (L) 09/08/2021   MCV 102.0 (H) 09/08/2021   PLT 150 09/08/2021   Recent Labs    09/04/21 0537 09/07/21 1021 09/07/21 2029 09/08/21 0815  NA 132* 125* 128* 128*  K 3.8 3.3* 3.1* 3.7  CL 104 93* 103 101  CO2 23 20* 20* 22  GLUCOSE 101* 116* 94 100*  BUN 17 26* 22 21  CREATININE 0.89 2.13* 1.63* 1.43*  CALCIUM 8.8* 9.0 8.4* 8.7*  GFRNONAA >60 26* 36* 42*  PROT 4.8* 5.6*  --  4.9*  ALBUMIN 2.0* 2.3*  --  2.6*  AST 21 53*  --  31  ALT 14 30  --  17  ALKPHOS 140* 141*  --  111  BILITOT 0.6 0.3  --  0.4    Iron/TIBC/Ferritin/ %Sat    Component Value Date/Time   IRON 50 08/17/2021 0828   TIBC 186 (L) 08/17/2021 0828   FERRITIN 636 (H) 08/17/2021 0828   IRONPCTSAT 27 08/17/2021 0828      RADIOGRAPHIC STUDIES: I have personally reviewed the radiological images as listed and agreed with the findings in the report. DG Abd 1  View  Result Date: 09/07/2021 CLINICAL DATA:  Nausea, bloating EXAM: ABDOMEN - 1 VIEW COMPARISON:  09/02/2021 FINDINGS: The bowel gas pattern is normal. Cholecystectomy clips. Pelvic phleboliths. Minimal spurring in the lumbar spine. IMPRESSION: Normal bowel gas pattern.  No acute findings. Electronically Signed   By: Lucrezia Europe M.D.   On: 09/07/2021 10:39   DG Abd 1 View  Result Date: 09/02/2021 CLINICAL DATA:  60 year old female with history of nausea and vomiting. History of ovarian cancer. EXAM: ABDOMEN - 1 VIEW COMPARISON:  No priors. FINDINGS: The bowel gas pattern is normal. No radio-opaque calculi or other significant radiographic abnormality are seen. Surgical clips project over the right upper quadrant of the abdomen, likely from prior cholecystectomy. IMPRESSION: 1. Nonobstructive bowel gas pattern. 2. No pneumoperitoneum. Electronically Signed   By: Vinnie Langton M.D.   On: 09/02/2021 06:06   CT ABDOMEN PELVIS W CONTRAST  Result Date: 08/31/2021 CLINICAL DATA:  Abdominal pain, history of ovarian cancer EXAM: CT ABDOMEN AND PELVIS WITH CONTRAST TECHNIQUE: Multidetector CT imaging of the abdomen and pelvis was performed using the standard protocol following bolus administration of intravenous contrast. CONTRAST:  14mL OMNIPAQUE IOHEXOL 300 MG/ML  SOLN COMPARISON:  CT abdomen and pelvis dated June 25, 2021 FINDINGS: Lower chest: No acute abnormality. Hepatobiliary: No focal liver abnormality is seen. Status post cholecystectomy. No biliary dilatation. In Pancreas: Unremarkable. No pancreatic ductal dilatation or surrounding inflammatory changes. Spleen: Normal in size without focal abnormality. Adrenals/Urinary Tract: Adrenal glands are unremarkable. Kidneys are normal, without renal calculi, focal lesion, or hydronephrosis. Bladder is unremarkable. Stomach/Bowel: Stomach is within normal limits. Appendix appears normal. No evidence of bowel wall thickening, distention, or  inflammatory  changes. Vascular/Lymphatic: Aortic atherosclerosis. No enlarged abdominal or pelvic lymph nodes. Reproductive: Uterus is surgically absent. Unchanged appearance of large cystic and solid mass of the right ovary, measures approximately 1.9 x 1.3 cm, when measured on coronal image, unchanged in size when remeasured in similar plane on prior exam. Other: Small fluid containing umbilical hernia. Similar peritoneal thickening and nodularity. Small to moderate volume abdominal ascites, slightly decreased when compared to the prior exam. Musculoskeletal: No acute or significant osseous findings. IMPRESSION: 1. No acute findings in the abdomen or pelvis. 2. Unchanged appearance of large cystic and solid mass of the right ovary. 3. Similar peritoneal thickening and nodularity. 4. Small to moderate volume abdominal ascites, slightly decreased when compared with prior exam. 5.  Aortic Atherosclerosis (ICD10-I70.0). Electronically Signed   By: Yetta Glassman M.D.   On: 08/31/2021 12:24   US Paracentesis  Result Date: 09/07/2021 INDICATION: History of ovarian cancer. Please perform ultrasound-guided paracentesis for therapeutic purposes. EXAM: ULTRASOUND-GUIDED PARACENTESIS COMPARISON:  Multiple previous ultrasound-guided paracenteses, most recently on 08/31/2021 yielding sphenoid cc of ascitic fluid MEDICATIONS: None. COMPLICATIONS: None immediate. TECHNIQUE: Informed written consent was obtained from the patient after a discussion of the risks, benefits and alternatives to treatment. A timeout was performed prior to the initiation of the procedure. Initial ultrasound scanning demonstrates a large amount of ascites within the right lower abdomen which was subsequently prepped and draped in the usual sterile fashion. 1% lidocaine with epinephrine was used for local anesthesia. Under direct ultrasound guidance, a 19 gauge, 7-cm, Yueh catheter was introduced. An ultrasound image was saved for documentation purposed. The  paracentesis was performed. The catheter was removed and a dressing was applied. The patient tolerated the procedure well without immediate post procedural complication. FINDINGS: A total of approximately 4.6 liters of blood-tinged serous fluid was removed. Samples were sent to the laboratory as requested by the clinical team. IMPRESSION: Successful ultrasound-guided paracentesis yielding 4.6 liters of peritoneal fluid. Electronically Signed   By: Sandi Mariscal M.D.   On: 09/07/2021 16:29   US Paracentesis  Result Date: 08/31/2021 INDICATION: Patient with history of ovarian cancer, recurrent ascites, new onset abdominal pain following paracentesis 08/29/2021. Request to IR for diagnostic and therapeutic paracentesis EXAM: ULTRASOUND GUIDED DIAGNOSTIC AND THERAPEUTIC PARACENTESIS MEDICATIONS: 8 mL 1% lidocaine COMPLICATIONS: None immediate. PROCEDURE: Informed written consent was obtained from the patient after a discussion of the risks, benefits and alternatives to treatment. A timeout was performed prior to the initiation of the procedure. Initial ultrasound scanning demonstrates a small amount of ascites within the right upper abdominal quadrant. The right upper abdomen was prepped and draped in the usual sterile fashion. 1% lidocaine was used for local anesthesia. Following this, a 19 gauge, 7-cm, Yueh catheter was introduced. An ultrasound image was saved for documentation purposes. The paracentesis was performed. The catheter was removed and a dressing was applied. The patient tolerated the procedure well without immediate post procedural complication. FINDINGS: A total of approximately 300 mL of clear, dark serosanguineous fluid was removed. Samples were sent to the laboratory as requested by the clinical team. IMPRESSION: Successful ultrasound-guided paracentesis yielding 300 milliliters of peritoneal fluid. Read by Candiss Norse, PA-C Electronically Signed   By: Miachel Roux M.D.   On: 08/31/2021  14:48   US Paracentesis  Result Date: 08/29/2021 INDICATION: Patient with history of ovarian cancer recurrent ascites request for paracentesis. EXAM: ULTRASOUND GUIDED  PARACENTESIS MEDICATIONS: Local 1% lidocaine only. COMPLICATIONS: None immediate. PROCEDURE: Informed written consent was obtained from  the patient after a discussion of the risks, benefits and alternatives to treatment. A timeout was performed prior to the initiation of the procedure. Initial ultrasound scanning demonstrates a large amount of ascites within the left lower abdominal quadrant. The left lower abdomen was prepped and draped in the usual sterile fashion. 1% lidocaine was used for local anesthesia. Following this, a 19 gauge, 7-cm, Yueh catheter was introduced. An ultrasound image was saved for documentation purposes. The paracentesis was performed. The catheter was removed and a dressing was applied. The patient tolerated the procedure well without immediate post procedural complication. FINDINGS: A total of approximately 4 L of amber colored fluid was removed. IMPRESSION: Successful ultrasound-guided paracentesis yielding 4 liters of peritoneal fluid. This exam was performed by Tsosie Billing PA-C, and was supervised and interpreted by Dr. Vernard Gambles. Electronically Signed   By: Lucrezia Europe M.D.   On: 08/29/2021 16:23   US Paracentesis  Result Date: 08/22/2021 INDICATION: Patient with history of ovarian cancer and recurrent ascites request for paracentesis. EXAM: ULTRASOUND GUIDED  PARACENTESIS MEDICATIONS: Local 1% lidocaine only. COMPLICATIONS: None immediate. PROCEDURE: Informed written consent was obtained from the patient after a discussion of the risks, benefits and alternatives to treatment. A timeout was performed prior to the initiation of the procedure. Initial ultrasound scanning demonstrates a large amount of ascites within the right lower abdominal quadrant. The right lower abdomen was prepped and draped in the usual  sterile fashion. 1% lidocaine was used for local anesthesia. Following this, a 19 gauge, 7-cm, Yueh catheter was introduced. An ultrasound image was saved for documentation purposes. The paracentesis was performed. The catheter was removed and a dressing was applied. The patient tolerated the procedure well without immediate post procedural complication. FINDINGS: A total of approximately 4 liters of amber colored fluid was removed. IMPRESSION: Successful ultrasound-guided paracentesis yielding 4 liters of peritoneal fluid. This exam was performed by Tsosie Billing PA-C, and was supervised and interpreted by Dr. Pascal Lux. Electronically Signed   By: Sandi Mariscal M.D.   On: 08/22/2021 16:38   US Paracentesis  Result Date: 08/16/2021 INDICATION: 60 year old female with history of ovarian cancer with recurrent ascites presenting for paracentesis. EXAM: ULTRASOUND GUIDED  PARACENTESIS MEDICATIONS: None. COMPLICATIONS: None immediate. PROCEDURE: Informed written consent was obtained from the patient after a discussion of the risks, benefits and alternatives to treatment. A timeout was performed prior to the initiation of the procedure. Initial ultrasound scanning demonstrates a large amount of ascites within the right lower abdominal quadrant. The right lower abdomen was prepped and draped in the usual sterile fashion. 1% lidocaine was used for local anesthesia. Following this, a 6 Fr Safe-T-Centesis catheter was introduced. An ultrasound image was saved for documentation purposes. The paracentesis was performed. The catheter was removed and a dressing was applied. The patient tolerated the procedure well without immediate post procedural complication. FINDINGS: A total of approximately 4 of translucent, amber fluid was removed. IMPRESSION: Successful ultrasound-guided paracentesis yielding 4 liters of peritoneal fluid. Ruthann Cancer, MD [facility] Electronically  Signed   By: Ruthann Cancer M.D.   On: 08/16/2021 16:25      ASSESSMENT & PLAN:  1. Malignant ascites   2. Intractable nausea and vomiting   3. Malignant neoplasm of right ovary (HCC)   4. Other acute pulmonary embolism without acute cor pulmonale ([place])   5. Acute kidney injury ([place])   6. Anxiety    #locally advanced ovarian Cancer  Large ovarian cystic mass with  malignant ascites, peritoneal nodularity She finished the planned  6 cycles of neoadjuvant chemo with G-CSF support. CA 125 has responded well. CT showed stable size of ovarian mass and peritoneal nodularity There is plan for de bulking surgery.  Last chemo therapy on 08/17/2021.  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 Eliquis was held during her recent admission, and also prior to liver biopsy, she has resumed after procedure.   # Intractable nausea and vomiting.  Has been on Reglan. Zofran did not help symptoms.  Compazine partially relieves her symptoms.  KUB today showed no obstruction.   # Dehydration, AKI Blood work today showed acute kidney failure, this is likely due to dehydration. Possible urinary retention too while on Reglan. Recommend patient to go to ER to be admitted for further evaluation.   #Abdominal pain She had negative work up for SBP and had empiric antibiotics.  Continue current pain regimen.   # recurrent malignant ascites. She will get paracentesis done while admitted.   #Thrombocytopenia, almost normalized platelet count of 144,000.  Resume Elqiuis for pulmonary embolism.   # Anxiety, recommend klonopin 0.5mg  1-2 time per day.   Recommend patient to go to emergency room for further evaluation.  She agrees with the plan.  I called ER triage nurse and communicated with ER physician who took care of this patient.  All questions were answered. The patient knows to call the clinic with any problems questions or concerns.  cc Adin Hector, MD   Follow up plan   TBD  We spent sufficient time to discuss many aspect of care, questions were answered to patient's satisfaction.  Earlie Server 09/07/2021

## 2021-09-08 NOTE — Progress Notes (Signed)
[place]  [phone] [phone]) [phone]  ID: Brittany Montoya OB: [birth year]  MR#: [id number]  VFI#:[id number]  Patient Care Team: Adin Hector, MD as PCP - General (Internal Medicine) Earlie Server, MD as Consulting Physician (Hematology and Oncology) Clent Jacks, RN as Oncology Nurse Navigator  CHIEF COMPLAINT: Ovarian cancer, malignant ascites, nausea, acute on chronic renal failure.  INTERVAL HISTORY: Patient feeling significantly improved after a 4.6 L of ascitic fluid removed by paracentesis.  She does not complain of any further nausea and has minimal abdominal pain.  Hemoglobin decreased to 6.9, but patient actively receiving blood transfusion.  Kidney function improving.  Patient expressed interest in going home today.  REVIEW OF SYSTEMS:   Review of Systems  Constitutional:  Positive for malaise/fatigue. Negative for fever and weight loss.  Respiratory: Negative.  Negative for cough, hemoptysis and shortness of breath.   Cardiovascular:  Negative for chest pain and leg swelling.  Gastrointestinal: Negative.  Negative for abdominal pain and nausea.  Genitourinary: Negative.  Negative for dysuria.  Musculoskeletal:  Negative for back pain.  Skin: Negative.  Negative for rash.  Neurological:  Positive for weakness. Negative for dizziness, focal weakness and headaches.  Psychiatric/Behavioral: Negative.  The patient is not nervous/anxious.    As per HPI. Otherwise, a complete review of systems is negative.  PAST MEDICAL HISTORY: Past Medical History:  Diagnosis Date   Anxiety    Arthritis    Ascites    Cirrhosis of liver ([place])    DDD (degenerative disc disease), cervical    Depression    Family history of breast cancer    GERD (gastroesophageal reflux disease)    Glaucoma    Headache    migraines   Hypertension    Ovarian cancer ([place]) 04/15/2021   PONV (postoperative nausea and vomiting)    Pulmonary embolism ([place])     Sleep apnea    Venous stasis     PAST SURGICAL HISTORY: Past Surgical History:  Procedure Laterality Date   ABDOMINAL HYSTERECTOMY N/A 2008   may have had left ovary removed   [place]     2012, 2015, 2020   COLONOSCOPY N/A 06/29/2021   Procedure: COLONOSCOPY;  Surgeon: Toledo, Benay Pike, MD;  Location: [facility] END[facility]COPY;  Service: Gastroenterology;  Laterality: N/A;  DOCTORS ARE IN AGREEMENT THAT TOLEDO CAN DO THIS PROCEDURE IN RUSSO'S BLOCK   ESOPHAGOGASTRODUODENOSCOPY N/A 06/29/2021   Procedure: ESOPHAGOGASTRODUODENOSCOPY (EGD);  Surgeon: Toledo, Benay Pike, MD;  Location: [facility] ENDOSCOPY;  Service: Gastroenterology;  Laterality: N/A;   PARACENTESIS  04/20/2021   PILONIDAL CYST / SINUS EXCISION     PORTACATH PLACEMENT Right 06/01/2021   Procedure: INSERTION PORT-A-CATH;  Surgeon: Herbert Pun, MD;  Location: [facility] ORS;  Service: General;  Laterality: Right;    FAMILY HISTORY: Family History  Problem Relation Age of Onset   Breast cancer Mother 66   Breast cancer Maternal Aunt        d. under 77    ADVANCED DIRECTIVES (Y/N):  @ADVDIR @  HEALTH MAINTENANCE: Social History   Tobacco Use   Smoking status: Never   Smokeless tobacco: Never  Vaping Use   Vaping Use: Never used  Substance Use Topics   Alcohol use: No   Drug use: No     Colonoscopy:  PAP:  Bone density:  Lipid panel:  Allergies  Allergen Reactions   Paclitaxel Shortness Of Breath and  Other (See Comments)    pt flush, stated she didn't feel well, taxol stopped (05/10/2021)   Amlodipine Other (See Comments) and Rash    Not effective Not effective    Penicillins Rash    Current Facility-Administered Medications  Medication Dose Route Frequency Provider Last Rate Last Admin   acetaminophen (TYLENOL) tablet 1,000 mg  1,000 mg Oral QHS Sharion Settler, NP   1,000 mg at 09/07/21 2301   apixaban (ELIQUIS) tablet 5 mg  5 mg Oral BID  Adefeso, Oladapo, DO   5 mg at 09/08/21 1027   cefTRIAXone (ROCEPHIN) 2 g in sodium chloride 0.9 % 100 mL IVPB  2 g Intravenous Q24H Adefeso, Oladapo, DO   Stopped at 09/07/21 2342   clonazePAM (KLONOPIN) tablet 0.5 mg  0.5 mg Oral BID Sharion Settler, NP   0.5 mg at 09/08/21 1026   DULoxetine (CYMBALTA) DR capsule 30 mg  30 mg Oral Daily Adefeso, Oladapo, DO       feeding supplement (ENSURE ENLIVE / ENSURE PLUS) liquid 237 mL  237 mL Oral BID BM Adefeso, Oladapo, DO   237 mL at 09/08/21 1353   multivitamin with minerals tablet 1 tablet  1 tablet Oral Daily Wieting, Richard, MD       traMADol Veatrice Bourbon) tablet 50 mg  50 mg Oral Q6H PRN Loletha Grayer, MD       Facility-Administered Medications Ordered in Other Encounters  Medication Dose Route Frequency Provider Last Rate Last Admin   LORazepam (ATIVAN) injection 0.5 mg  0.5 mg Intravenous Once PRN Earlie Server, MD        OBJECTIVE: Vitals:   09/08/21 1345 09/08/21 1400  BP: 96/64 94/66  Pulse: 90 88  Resp: 20 (!) 22  Temp: 98.9 F (37.2 C) 98.9 F (37.2 C)  SpO2: 98% 98%     Body mass index is 35.52 kg/m.    ECOG FS:1 - Symptomatic but completely ambulatory  General: Well-developed, well-nourished, no acute distress. Eyes: Pink conjunctiva, anicteric sclera. HEENT: Normocephalic, moist mucous membranes. Lungs: No audible wheezing or coughing. Heart: Regular rate and rhythm. Abdomen: Soft, nontender, no obvious distention. Musculoskeletal: No edema, cyanosis, or clubbing. Neuro: Alert, answering all questions appropriately. Cranial nerves grossly intact. Skin: No rashes or petechiae noted. Psych: Normal affect.  LAB RESULTS:  Lab Results  Component Value Date   NA 128 (L) 09/08/2021   K 3.7 09/08/2021   CL 101 09/08/2021   CO2 22 09/08/2021   GLUCOSE 100 (H) 09/08/2021   BUN 21 09/08/2021   CREATININE 1.43 (H) 09/08/2021   CALCIUM 8.7 (L) 09/08/2021   PROT 4.9 (L) 09/08/2021   ALBUMIN 2.6 (L) 09/08/2021   AST 31  09/08/2021   ALT 17 09/08/2021   ALKPHOS 111 09/08/2021   BILITOT 0.4 09/08/2021   GFRNONAA 42 (L) 09/08/2021   GFRAA >60 03/06/2015    Lab Results  Component Value Date   WBC 31.5 (H) 09/08/2021   NEUTROABS 36.0 (H) 09/07/2021   HGB 6.9 (L) 09/08/2021   HCT 20.3 (L) 09/08/2021   MCV 102.0 (H) 09/08/2021   PLT 150 09/08/2021     STUDIES: DG Abd 1 View  Result Date: 09/07/2021 CLINICAL DATA:  Nausea, bloating EXAM: ABDOMEN - 1 VIEW COMPARISON:  09/02/2021 FINDINGS: The bowel gas pattern is normal. Cholecystectomy clips. Pelvic phleboliths. Minimal spurring in the lumbar spine. IMPRESSION: Normal bowel gas pattern.  No acute findings. Electronically Signed   By: Lucrezia Europe M.D.   On: 09/07/2021 10:39  DG Abd 1 View  Result Date: 09/02/2021 CLINICAL DATA:  60 year old female with history of nausea and vomiting. History of ovarian cancer. EXAM: ABDOMEN - 1 VIEW COMPARISON:  No priors. FINDINGS: The bowel gas pattern is normal. No radio-opaque calculi or other significant radiographic abnormality are seen. Surgical clips project over the right upper quadrant of the abdomen, likely from prior cholecystectomy. IMPRESSION: 1. Nonobstructive bowel gas pattern. 2. No pneumoperitoneum. Electronically Signed   By: Vinnie Langton M.D.   On: 09/02/2021 06:06   CT ABDOMEN PELVIS W CONTRAST  Result Date: 08/31/2021 CLINICAL DATA:  Abdominal pain, history of ovarian cancer EXAM: CT ABDOMEN AND PELVIS WITH CONTRAST TECHNIQUE: Multidetector CT imaging of the abdomen and pelvis was performed using the standard protocol following bolus administration of intravenous contrast. CONTRAST:  114mL OMNIPAQUE IOHEXOL 300 MG/ML  SOLN COMPARISON:  CT abdomen and pelvis dated June 25, 2021 FINDINGS: Lower chest: No acute abnormality. Hepatobiliary: No focal liver abnormality is seen. Status post cholecystectomy. No biliary dilatation. In Pancreas: Unremarkable. No pancreatic ductal dilatation or surrounding  inflammatory changes. Spleen: Normal in size without focal abnormality. Adrenals/Urinary Tract: Adrenal glands are unremarkable. Kidneys are normal, without renal calculi, focal lesion, or hydronephrosis. Bladder is unremarkable. Stomach/Bowel: Stomach is within normal limits. Appendix appears normal. No evidence of bowel wall thickening, distention, or inflammatory changes. Vascular/Lymphatic: Aortic atherosclerosis. No enlarged abdominal or pelvic lymph nodes. Reproductive: Uterus is surgically absent. Unchanged appearance of large cystic and solid mass of the right ovary, measures approximately 1.9 x 1.3 cm, when measured on coronal image, unchanged in size when remeasured in similar plane on prior exam. Other: Small fluid containing umbilical hernia. Similar peritoneal thickening and nodularity. Small to moderate volume abdominal ascites, slightly decreased when compared to the prior exam. Musculoskeletal: No acute or significant osseous findings. IMPRESSION: 1. No acute findings in the abdomen or pelvis. 2. Unchanged appearance of large cystic and solid mass of the right ovary. 3. Similar peritoneal thickening and nodularity. 4. Small to moderate volume abdominal ascites, slightly decreased when compared with prior exam. 5.  Aortic Atherosclerosis (ICD10-I70.0). Electronically Signed   By: Yetta Glassman M.D.   On: 08/31/2021 12:24   US Paracentesis  Result Date: 09/07/2021 INDICATION: History of ovarian cancer. Please perform ultrasound-guided paracentesis for therapeutic purposes. EXAM: ULTRASOUND-GUIDED PARACENTESIS COMPARISON:  Multiple previous ultrasound-guided paracenteses, most recently on 08/31/2021 yielding sphenoid cc of ascitic fluid MEDICATIONS: None. COMPLICATIONS: None immediate. TECHNIQUE: Informed written consent was obtained from the patient after a discussion of the risks, benefits and alternatives to treatment. A timeout was performed prior to the initiation of the procedure. Initial  ultrasound scanning demonstrates a large amount of ascites within the right lower abdomen which was subsequently prepped and draped in the usual sterile fashion. 1% lidocaine with epinephrine was used for local anesthesia. Under direct ultrasound guidance, a 19 gauge, 7-cm, Yueh catheter was introduced. An ultrasound image was saved for documentation purposed. The paracentesis was performed. The catheter was removed and a dressing was applied. The patient tolerated the procedure well without immediate post procedural complication. FINDINGS: A total of approximately 4.6 liters of blood-tinged serous fluid was removed. Samples were sent to the laboratory as requested by the clinical team. IMPRESSION: Successful ultrasound-guided paracentesis yielding 4.6 liters of peritoneal fluid. Electronically Signed   By: Sandi Mariscal M.D.   On: 09/07/2021 16:29   US Paracentesis  Result Date: 08/31/2021 INDICATION: Patient with history of ovarian cancer, recurrent ascites, new onset abdominal pain following paracentesis 08/29/2021.  Request to IR for diagnostic and therapeutic paracentesis EXAM: ULTRASOUND GUIDED DIAGNOSTIC AND THERAPEUTIC PARACENTESIS MEDICATIONS: 8 mL 1% lidocaine COMPLICATIONS: None immediate. PROCEDURE: Informed written consent was obtained from the patient after a discussion of the risks, benefits and alternatives to treatment. A timeout was performed prior to the initiation of the procedure. Initial ultrasound scanning demonstrates a small amount of ascites within the right upper abdominal quadrant. The right upper abdomen was prepped and draped in the usual sterile fashion. 1% lidocaine was used for local anesthesia. Following this, a 19 gauge, 7-cm, Yueh catheter was introduced. An ultrasound image was saved for documentation purposes. The paracentesis was performed. The catheter was removed and a dressing was applied. The patient tolerated the procedure well without immediate post procedural  complication. FINDINGS: A total of approximately 300 mL of clear, dark serosanguineous fluid was removed. Samples were sent to the laboratory as requested by the clinical team. IMPRESSION: Successful ultrasound-guided paracentesis yielding 300 milliliters of peritoneal fluid. Read by Candiss Norse, PA-C Electronically Signed   By: Miachel Roux M.D.   On: 08/31/2021 14:48   US Paracentesis  Result Date: 08/29/2021 INDICATION: Patient with history of ovarian cancer recurrent ascites request for paracentesis. EXAM: ULTRASOUND GUIDED  PARACENTESIS MEDICATIONS: Local 1% lidocaine only. COMPLICATIONS: None immediate. PROCEDURE: Informed written consent was obtained from the patient after a discussion of the risks, benefits and alternatives to treatment. A timeout was performed prior to the initiation of the procedure. Initial ultrasound scanning demonstrates a large amount of ascites within the left lower abdominal quadrant. The left lower abdomen was prepped and draped in the usual sterile fashion. 1% lidocaine was used for local anesthesia. Following this, a 19 gauge, 7-cm, Yueh catheter was introduced. An ultrasound image was saved for documentation purposes. The paracentesis was performed. The catheter was removed and a dressing was applied. The patient tolerated the procedure well without immediate post procedural complication. FINDINGS: A total of approximately 4 L of amber colored fluid was removed. IMPRESSION: Successful ultrasound-guided paracentesis yielding 4 liters of peritoneal fluid. This exam was performed by Tsosie Billing PA-C, and was supervised and interpreted by Dr. Vernard Gambles. Electronically Signed   By: Lucrezia Europe M.D.   On: 08/29/2021 16:23   US Paracentesis  Result Date: 08/22/2021 INDICATION: Patient with history of ovarian cancer and recurrent ascites request for paracentesis. EXAM: ULTRASOUND GUIDED  PARACENTESIS MEDICATIONS: Local 1% lidocaine only. COMPLICATIONS: None immediate.  PROCEDURE: Informed written consent was obtained from the patient after a discussion of the risks, benefits and alternatives to treatment. A timeout was performed prior to the initiation of the procedure. Initial ultrasound scanning demonstrates a large amount of ascites within the right lower abdominal quadrant. The right lower abdomen was prepped and draped in the usual sterile fashion. 1% lidocaine was used for local anesthesia. Following this, a 19 gauge, 7-cm, Yueh catheter was introduced. An ultrasound image was saved for documentation purposes. The paracentesis was performed. The catheter was removed and a dressing was applied. The patient tolerated the procedure well without immediate post procedural complication. FINDINGS: A total of approximately 4 liters of amber colored fluid was removed. IMPRESSION: Successful ultrasound-guided paracentesis yielding 4 liters of peritoneal fluid. This exam was performed by Tsosie Billing PA-C, and was supervised and interpreted by Dr. Pascal Lux. Electronically Signed   By: Sandi Mariscal M.D.   On: 08/22/2021 16:38   US Paracentesis  Result Date: 08/16/2021 INDICATION: 60 year old female with history of ovarian cancer with recurrent ascites presenting for paracentesis. EXAM:  ULTRASOUND GUIDED  PARACENTESIS MEDICATIONS: None. COMPLICATIONS: None immediate. PROCEDURE: Informed written consent was obtained from the patient after a discussion of the risks, benefits and alternatives to treatment. A timeout was performed prior to the initiation of the procedure. Initial ultrasound scanning demonstrates a large amount of ascites within the right lower abdominal quadrant. The right lower abdomen was prepped and draped in the usual sterile fashion. 1% lidocaine was used for local anesthesia. Following this, a 6 Fr Safe-T-Centesis catheter was introduced. An ultrasound image was saved for documentation purposes. The paracentesis was performed. The catheter was removed and a dressing  was applied. The patient tolerated the procedure well without immediate post procedural complication. FINDINGS: A total of approximately 4 of translucent, amber fluid was removed. IMPRESSION: Successful ultrasound-guided paracentesis yielding 4 liters of peritoneal fluid. Ruthann Cancer, MD [facility] Electronically Signed   By: Ruthann Cancer M.D.   On: 08/16/2021 16:25    ASSESSMENT: Ovarian cancer, malignant ascites, nausea, acute on chronic renal failure.  PLAN:    1.  Ovarian cancer: Patient last received chemotherapy on August 17, 2021.  Plan is to have debulking surgery with gynecology oncology in early January.  No further intervention is needed at this time. 2.  Malignant ascites: Appreciate interventional radiology input.  Paracentesis removing 4.6 L of fluid yesterday with significant improvement of patient's symptoms. 3.  Renal failure: Creatinine has significantly improved to 1.43. 4.  Hyponatremia: Improving.  Patient sodium is increased to 128. 5.  Hypokalemia: Resolved. 6.  Anemia: Patient's hemoglobin decreased to 6.9.  She is actively receiving 1 unit of packed red blood cells. 7.  Disposition: Patient significantly improved since admission.  Okay to discharge from an oncology standpoint. She has been instructed to keep her appointment in the [facility] on September 14, 2021.     Lloyd Huger, MD   09/08/2021 2:55 PM

## 2021-09-08 NOTE — Discharge Summary (Signed)
[facility] [place] NAME: Brittany Montoya    MR#:  [id number]  DATE OF BIRTH:  02-07-1961  DATE OF ADMISSION:  09/07/2021 ADMITTING PHYSICIAN: Bernadette Hoit, DO  DATE OF DISCHARGE: 09/08/2021  PRIMARY CARE PHYSICIAN: Adin Hector, MD    ADMISSION DIAGNOSIS:  Abdominal distension [R14.0] Malignant ascites [R18.0] AKI (acute kidney injury) ([place]) [N17.9] Acute kidney injury ([place]) [N17.9]  DISCHARGE DIAGNOSIS:  Principal Problem:   Acute kidney injury ([place]) Active Problems:   Essential hypertension   Malignant ascites   Ovarian cancer ([place])   Pulmonary embolus (HCC)   Depression with anxiety   Macrocytic anemia   Hyponatremia   Abdominal distention   Hypoalbuminemia due to protein-calorie malnutrition (HCC)   Hypokalemia   Transaminitis   Obesity (BMI 30-39.9)   Leukocytosis   SECONDARY DIAGNOSIS:   Past Medical History:  Diagnosis Date   Anxiety    Arthritis    Ascites    Cirrhosis of liver (HCC)    DDD (degenerative disc disease), cervical    Depression    Family history of breast cancer    GERD (gastroesophageal reflux disease)    Glaucoma    Headache    migraines   Hypertension    Ovarian cancer ([place]) 04/15/2021   PONV (postoperative nausea and vomiting)    Pulmonary embolism (HCC)    Sleep apnea    Venous stasis     HOSPITAL COURSE:   Abdominal distention due to malignant ascites.  Had a paracentesis which removed 4.6 L.  Empiric Levaquin.  Was started on Rocephin here.  Follow-up cultures as outpatient. Acute kidney injury and relative hypotension.  Creatinine 2.13 on presentation and down to 1.43 upon disposition.  Patient feeling better and wanted to go home.  Baseline creatinine around 0.89.  Recommend follow-up BMP and outpatient appointment.  Hold antihypertensive medications. Anemia.  Hemoglobin dropped down to 6.9.  Giving 1 unit of packed red blood cells prior to disposition.  Check hemoglobin  at outpatient appointment with hematology. Ovarian cancer.  Last chemotherapy August 17, 2021.  Scheduled for debulking surgery in early January.  Has follow-up appointment with Dr. Tasia Catchings on the 30th. Hyponatremia.  Sodium 125 on presentation.  Up to 128.  Discontinue hydrochlorothiazide.  This is not a good medication with hyponatremia Hypomagnesemia replaced IV yesterday Hypokalemia.  Discontinue hydrochlorothiazide Elevated liver function .  AST normalized to 31.  ALT already in normal range.  Alkaline phosphatase now in normal range at 111 Leukocytosis.  Was empirically started on Rocephin and then switched over to Levaquin upon discharge. History of pulmonary embolism in the past on Eliquis. Obesity with a BMI of 35.5 to  DISCHARGE CONDITIONS:   Satisfactory   CONSULTS OBTAINED:  Treatment Team:  Lloyd Huger, MD  DRUG ALLERGIES:   Allergies  Allergen Reactions   Paclitaxel Shortness Of Breath and Other (See Comments)    pt flush, stated she didn't feel well, taxol stopped (05/10/2021)   Amlodipine Other (See Comments) and Rash    Not effective Not effective    Penicillins Rash    DISCHARGE MEDICATIONS:   Allergies as of 09/08/2021       Reactions   Paclitaxel Shortness Of Breath, Other (See Comments)   pt flush, stated she didn't feel well, taxol stopped (05/10/2021)   Amlodipine Other (See Comments), Rash   Not effective Not effective   Penicillins Rash        Medication List  STOP taking these medications    hydrochlorothiazide 12.5 MG tablet Commonly known as: HYDRODIURIL   losartan 50 MG tablet Commonly known as: COZAAR       TAKE these medications    acetaminophen 500 MG tablet Commonly known as: TYLENOL Take 1,000 mg by mouth at bedtime.   albuterol 108 (90 Base) MCG/ACT inhaler Commonly known as: VENTOLIN HFA Inhale 1-2 puffs into the lungs every 6 (six) hours as needed for wheezing or shortness of breath.   apixaban 5 MG Tabs  tablet Commonly known as: Eliquis Take 1 tablet (5 mg total) by mouth 2 (two) times daily.   clonazePAM 0.5 MG tablet Commonly known as: KLONOPIN Take 1 tablet (0.5 mg total) by mouth 2 (two) times daily.   DULoxetine 30 MG capsule Commonly known as: Cymbalta Take 1 capsule (30 mg total) by mouth daily.   feeding supplement Liqd Take 237 mLs by mouth 2 (two) times daily between meals.   hydrocortisone butyrate 0.1 % Crea cream Commonly known as: LUCOID Apply 1 application topically daily.   levofloxacin 500 MG tablet Commonly known as: Levaquin Take 1 tablet (500 mg total) by mouth daily for 7 days. Start taking on: September 09, 2021 What changed:  medication strength how much to take   lidocaine-prilocaine cream Commonly known as: EMLA Apply to affected area once What changed:  how much to take how to take this when to take this reasons to take this   multivitamin with minerals Tabs tablet Take 1 tablet by mouth daily.   nystatin 100000 UNIT/ML suspension Commonly known as: MYCOSTATIN Use as directed 5 mLs (500,000 Units total) in the mouth or throat 4 (four) times daily.   ondansetron 8 MG tablet Commonly known as: Zofran Take 1 tablet (8 mg total) by mouth 2 (two) times daily as needed for refractory nausea / vomiting. Start on day 3 after carboplatin chemo.   potassium chloride SA 20 MEQ tablet Commonly known as: KLOR-CON M Take 1 tablet (20 mEq total) by mouth 2 (two) times daily.   prochlorperazine 10 MG tablet Commonly known as: COMPAZINE Take 1 tablet (10 mg total) by mouth every 6 (six) hours as needed (Nausea or vomiting).   promethazine 25 MG suppository Commonly known as: PHENERGAN Place 1 suppository (25 mg total) rectally every 6 (six) hours as needed for nausea or vomiting.   traMADol 50 MG tablet Commonly known as: ULTRAM Take 50 mg by mouth every 6 (six) hours as needed.         DISCHARGE INSTRUCTIONS:  Follow-up oncology with  appointment Follow-up PMD  If you experience worsening of your admission symptoms, develop shortness of breath, life threatening emergency, suicidal or homicidal thoughts you must seek medical attention immediately by calling 911 or calling your MD immediately  if symptoms less severe.  You Must read complete instructions/literature along with all the possible adverse reactions/side effects for all the Medicines you take and that have been prescribed to you. Take any new Medicines after you have completely understood and accept all the possible adverse reactions/side effects.   Please note  You were cared for by a hospitalist during your hospital stay. If you have any questions about your discharge medications or the care you received while you were in the hospital after you are discharged, you can call the unit and asked to speak with the hospitalist on call if the hospitalist that took care of you is not available. Once you are discharged, your primary care physician  will handle any further medical issues. Please note that NO REFILLS for any discharge medications will be authorized once you are discharged, as it is imperative that you return to your primary care physician (or establish a relationship with a primary care physician if you do not have one) for your aftercare needs so that they can reassess your need for medications and monitor your lab values.    Today   CHIEF COMPLAINT:   Chief Complaint  Patient presents with   Emesis   Abdominal Pain    HISTORY OF PRESENT ILLNESS:  Brittany Montoya  is a 60 y.o. female admitted with acute kidney injury vomiting abdominal pain   VITAL SIGNS:  Blood pressure 94/66, pulse 88, temperature 98.9 F (37.2 C), temperature source Oral, resp. rate 19, height 5\' 1"  (1.549 m), weight 85.3 kg, SpO2 98 %.  I/O:   Intake/Output Summary (Last 24 hours) at 09/08/2021 1606 Last data filed at 09/08/2021 1037 Gross per 24 hour  Intake 1142.88 ml   Output --  Net 1142.88 ml    PHYSICAL EXAMINATION:  GENERAL:  60 y.o.-year-old patient lying in the bed with no acute distress.  EYES: Pupils equal, round, reactive to light and accommodation. No scleral icterus.  HEENT: Head atraumatic, normocephalic. Oropharynx and nasopharynx clear.   LUNGS: Normal breath sounds bilaterally, no wheezing, rales,rhonchi or crepitation. CARDIOVASCULAR: S1, S2 normal. No murmurs, rubs, or gallops.  ABDOMEN: Soft, non-tender, distended.  EXTREMITIES: 2+ pedal edema.  NEUROLOGIC: Cranial nerves II through XII are intact. Muscle strength 5/5 in all extremities. Sensation intact. Gait not checked.  PSYCHIATRIC: The patient is alert and oriented x 3.  SKIN: No obvious rash, lesion, or ulcer.   DATA REVIEW:   CBC Recent Labs  Lab 09/08/21 0815  WBC 31.5*  HGB 6.9*  HCT 20.3*  PLT 150    Chemistries  Recent Labs  Lab 09/08/21 0500 09/08/21 0815  NA  --  128*  K  --  3.7  CL  --  101  CO2  --  22  GLUCOSE  --  100*  BUN  --  21  CREATININE  --  1.43*  CALCIUM  --  8.7*  MG 2.8*  --   AST  --  31  ALT  --  17  ALKPHOS  --  111  BILITOT  --  0.4     Microbiology Results  Results for orders placed or performed during the hospital encounter of 09/07/21  Resp Panel by RT-PCR (Flu A&B, Covid) Nasopharyngeal Swab     Status: None   Collection Time: 09/07/21  1:21 PM   Specimen: Nasopharyngeal Swab; Nasopharyngeal(NP) swabs in vial transport medium  Result Value Ref Range Status   SARS Coronavirus 2 by RT PCR NEGATIVE NEGATIVE Final    Comment: (NOTE) SARS-CoV-2 target nucleic acids are NOT DETECTED.  The SARS-CoV-2 RNA is generally detectable in upper respiratory specimens during the acute phase of infection. The lowest concentration of SARS-CoV-2 viral copies this assay can detect is 138 copies/mL. A negative result does not preclude SARS-Cov-2 infection and should not be used as the sole basis for treatment or other patient  management decisions. A negative result may occur with  improper specimen collection/handling, submission of specimen other than nasopharyngeal swab, presence of viral mutation(s) within the areas targeted by this assay, and inadequate number of viral copies(<138 copies/mL). A negative result must be combined with clinical observations, patient history, and epidemiological information. The expected result is Negative.  Fact Sheet for Patients:  EntrepreneurPulse.com.au  Fact Sheet for Healthcare Providers:  IncredibleEmployment.be  This test is no t yet approved or cleared by the [place] FDA and  has been authorized for detection and/or diagnosis of SARS-CoV-2 by FDA under an Emergency Use Authorization (EUA). This EUA will remain  in effect (meaning this test can be used) for the duration of the COVID-19 declaration under Section 564(b)(1) of the Act, 21 U.S.C.section 360bbb-3(b)(1), unless the authorization is terminated  or revoked sooner.       Influenza A by PCR NEGATIVE NEGATIVE Final   Influenza B by PCR NEGATIVE NEGATIVE Final    Comment: (NOTE) The Xpert Xpress SARS-CoV-2/FLU/RSV plus assay is intended as an aid in the diagnosis of influenza from Nasopharyngeal swab specimens and should not be used as a sole basis for treatment. Nasal washings and aspirates are unacceptable for Xpert Xpress SARS-CoV-2/FLU/RSV testing.  Fact Sheet for Patients: EntrepreneurPulse.com.au  Fact Sheet for Healthcare Providers: IncredibleEmployment.be  This test is not yet approved or cleared by the [place] FDA and has been authorized for detection and/or diagnosis of SARS-CoV-2 by FDA under an Emergency Use Authorization (EUA). This EUA will remain in effect (meaning this test can be used) for the duration of the COVID-19 declaration under Section 564(b)(1) of the Act, 21 U.S.C. section 360bbb-3(b)(1),  unless the authorization is terminated or revoked.  Performed at [facility], [address] [place], [place] [address]   Body fluid culture w Gram Stain     Status: None (Preliminary result)   Collection Time: 09/07/21  3:32 PM   Specimen: PATH Cytology Peritoneal fluid  Result Value Ref Range Status   Specimen Description   Final    PERITONEAL Performed at [facility], [address], [place], [place] [address]    Special Requests   Final    NONE Performed at [facility], [address] [place], [place] [address]    Gram Stain   Final    FEW WBC PRESENT,BOTH PMN AND MONONUCLEAR NO ORGANISMS SEEN    Culture   Final    NO GROWTH < 24 HOURS Performed at [facility], [place] [address], [place], [place] [address]    Report Status PENDING  Incomplete    RADIOLOGY:  DG Abd 1 View  Result Date: 09/07/2021 CLINICAL DATA:  Nausea, bloating EXAM: ABDOMEN - 1 VIEW COMPARISON:  09/02/2021 FINDINGS: The bowel gas pattern is normal. Cholecystectomy clips. Pelvic phleboliths. Minimal spurring in the lumbar spine. IMPRESSION: Normal bowel gas pattern.  No acute findings. Electronically Signed   By: Lucrezia Europe M.D.   On: 09/07/2021 10:39   US Paracentesis  Result Date: 09/07/2021 INDICATION: History of ovarian cancer. Please perform ultrasound-guided paracentesis for therapeutic purposes. EXAM: ULTRASOUND-GUIDED PARACENTESIS COMPARISON:  Multiple previous ultrasound-guided paracenteses, most recently on 08/31/2021 yielding sphenoid cc of ascitic fluid MEDICATIONS: None. COMPLICATIONS: None immediate. TECHNIQUE: Informed written consent was obtained from the patient after a discussion of the risks, benefits and alternatives to treatment. A timeout was performed prior to the initiation of the procedure. Initial ultrasound scanning demonstrates a large amount of ascites within the right lower abdomen which was subsequently prepped and draped in the usual  sterile fashion. 1% lidocaine with epinephrine was used for local anesthesia. Under direct ultrasound guidance, a 19 gauge, 7-cm, Yueh catheter was introduced. An ultrasound image was saved for documentation purposed. The paracentesis was performed. The catheter was removed and a dressing was applied. The  patient tolerated the procedure well without immediate post procedural complication. FINDINGS: A total of approximately 4.6 liters of blood-tinged serous fluid was removed. Samples were sent to the laboratory as requested by the clinical team. IMPRESSION: Successful ultrasound-guided paracentesis yielding 4.6 liters of peritoneal fluid. Electronically Signed   By: Sandi Mariscal M.D.   On: 09/07/2021 16:29      Management plans discussed with the patient, family and they are in agreement.  CODE STATUS:     Code Status Orders  (From admission, onward)           Start     Ordered   09/07/21 1751  Full code  Continuous        09/07/21 1750           Code Status History     Date Active Date Inactive Code Status Order ID Comments User Context   08/31/2021 1358 09/04/2021 2335 Full Code [id number]  Ivor Costa, MD ED       TOTAL TIME TAKING CARE OF THIS PATIENT: 35 minutes.    Loletha Grayer M.D on 09/08/2021 at 4:06 PM   [facility]  CC: Primary care physician; Adin Hector, MD

## 2021-09-10 LAB — TYPE AND SCREEN
ABO/RH(D): A POS
Antibody Screen: NEGATIVE
Unit division: 0

## 2021-09-10 LAB — BPAM RBC
Blood Product Expiration Date: 202212272359
ISSUE DATE / TIME: 202212241341
Unit Type and Rh: 9500

## 2021-09-11 LAB — PATHOLOGIST SMEAR REVIEW

## 2021-09-11 LAB — BODY FLUID CULTURE W GRAM STAIN: Culture: NO GROWTH

## 2021-09-12 ENCOUNTER — Other Ambulatory Visit: Payer: Self-pay

## 2021-09-12 ENCOUNTER — Ambulatory Visit
Admission: RE | Admit: 2021-09-12 | Discharge: 2021-09-12 | Disposition: A | Discharge status: Home / Self Care | Payer: BC Managed Care – PPO | Source: Ambulatory Visit | Attending: Oncology | Admitting: Oncology

## 2021-09-12 DIAGNOSIS — R180 Malignant ascites: Secondary | ICD-10-CM | POA: Diagnosis present

## 2021-09-12 DIAGNOSIS — C801 Malignant (primary) neoplasm, unspecified: Secondary | ICD-10-CM | POA: Insufficient documentation

## 2021-09-13 ENCOUNTER — Inpatient Hospital Stay: Payer: BC Managed Care – PPO

## 2021-09-13 NOTE — Progress Notes (Addendum)
Nutrition Follow-up:   Patient with probable advanced ovarian cancer with malignant ascites.  Noted multiple hospitalizations recently.   Spoke with patient via phone.  Patient reports that she is having issues with nausea.  Taking compazine about q 6 hours. Says that she is having paracentesis about weekly with about 4 L removed each time.  Only able to eat ritz crackers with peanut butter today so far.  Says she tried [place] food (noodles) last night and did pretty good then woke up this am with vomiting (4:30).    Says that her feet are swollen and she is trying to keep them elevated.     Medications: reviewed  Labs: reviewed  Anthropometrics:   Weight 188 lb on 12/23  190 lb on 12/2 181 lb on 11/18 184 lb on 10/20 198 lb on 7/27   NUTRITION DIAGNOSIS: Inadequate oral intake continues   INTERVENTION:  Recommend trying ensure clear or boost breeze/soothe. Unable to tolerate "milky" shakes at this time. Emailed handout on nausea and vomiting with recommended food list. Emailed recipes for nausea as well. Encouraged taking nausea medication. Encouraged patient to keep appointment with NP tomorrow.    Ensure Clear samples left at registration for patient to pick up tomorrow.   MONITORING, EVALUATION, GOAL: weight trends, intake   NEXT VISIT: to be determined  Katianna Mcclenney B. Zenia Resides, [place], [place] Registered Dietitian [phone] (mobile)

## 2021-09-14 ENCOUNTER — Inpatient Hospital Stay (HOSPITAL_BASED_OUTPATIENT_CLINIC_OR_DEPARTMENT_OTHER): Payer: BC Managed Care – PPO | Admitting: Nurse Practitioner

## 2021-09-14 ENCOUNTER — Other Ambulatory Visit: Payer: Self-pay

## 2021-09-14 ENCOUNTER — Ambulatory Visit: Payer: BC Managed Care – PPO

## 2021-09-14 ENCOUNTER — Inpatient Hospital Stay: Payer: BC Managed Care – PPO

## 2021-09-14 ENCOUNTER — Telehealth: Payer: Self-pay | Admitting: *Deleted

## 2021-09-14 ENCOUNTER — Encounter: Payer: Self-pay | Admitting: Nurse Practitioner

## 2021-09-14 VITALS — BP 88/47 | HR 116 | Temp 98.6°F | Resp 16 | Wt 186.0 lb

## 2021-09-14 DIAGNOSIS — C561 Malignant neoplasm of right ovary: Secondary | ICD-10-CM | POA: Diagnosis not present

## 2021-09-14 DIAGNOSIS — C569 Malignant neoplasm of unspecified ovary: Secondary | ICD-10-CM

## 2021-09-14 DIAGNOSIS — G893 Neoplasm related pain (acute) (chronic): Secondary | ICD-10-CM

## 2021-09-14 DIAGNOSIS — E8809 Other disorders of plasma-protein metabolism, not elsewhere classified: Secondary | ICD-10-CM

## 2021-09-14 DIAGNOSIS — E46 Unspecified protein-calorie malnutrition: Secondary | ICD-10-CM

## 2021-09-14 DIAGNOSIS — R601 Generalized edema: Secondary | ICD-10-CM

## 2021-09-14 DIAGNOSIS — R188 Other ascites: Secondary | ICD-10-CM

## 2021-09-14 DIAGNOSIS — R180 Malignant ascites: Secondary | ICD-10-CM

## 2021-09-14 LAB — CBC WITH DIFFERENTIAL/PLATELET
Abs Immature Granulocytes: 0.32 10*3/uL — ABNORMAL HIGH (ref 0.00–0.07)
Basophils Absolute: 0.1 10*3/uL (ref 0.0–0.1)
Basophils Relative: 0 %
Eosinophils Absolute: 0 10*3/uL (ref 0.0–0.5)
Eosinophils Relative: 0 %
HCT: 28 % — ABNORMAL LOW (ref 36.0–46.0)
Hemoglobin: 9.3 g/dL — ABNORMAL LOW (ref 12.0–15.0)
Immature Granulocytes: 1 %
Lymphocytes Relative: 4 %
Lymphs Abs: 0.9 10*3/uL (ref 0.7–4.0)
MCH: 32.2 pg (ref 26.0–34.0)
MCHC: 33.2 g/dL (ref 30.0–36.0)
MCV: 96.9 fL (ref 80.0–100.0)
Monocytes Absolute: 1.4 10*3/uL — ABNORMAL HIGH (ref 0.1–1.0)
Monocytes Relative: 6 %
Neutro Abs: 23.4 10*3/uL — ABNORMAL HIGH (ref 1.7–7.7)
Neutrophils Relative %: 89 %
Platelets: 320 10*3/uL (ref 150–400)
RBC: 2.89 MIL/uL — ABNORMAL LOW (ref 3.87–5.11)
RDW: 19.7 % — ABNORMAL HIGH (ref 11.5–15.5)
WBC: 26.1 10*3/uL — ABNORMAL HIGH (ref 4.0–10.5)
nRBC: 0 % (ref 0.0–0.2)

## 2021-09-14 LAB — COMPREHENSIVE METABOLIC PANEL
ALT: 11 U/L (ref 0–44)
AST: 32 U/L (ref 15–41)
Albumin: 1.9 g/dL — ABNORMAL LOW (ref 3.5–5.0)
Alkaline Phosphatase: 123 U/L (ref 38–126)
Anion gap: 12 (ref 5–15)
BUN: 17 mg/dL (ref 8–23)
CO2: 21 mmol/L — ABNORMAL LOW (ref 22–32)
Calcium: 8.8 mg/dL — ABNORMAL LOW (ref 8.9–10.3)
Chloride: 94 mmol/L — ABNORMAL LOW (ref 98–111)
Creatinine, Ser: 1.56 mg/dL — ABNORMAL HIGH (ref 0.44–1.00)
GFR, Estimated: 38 mL/min — ABNORMAL LOW (ref >60)
Glucose, Bld: 121 mg/dL — ABNORMAL HIGH (ref 70–99)
Potassium: 3.9 mmol/L (ref 3.5–5.1)
Sodium: 127 mmol/L — ABNORMAL LOW (ref 135–145)
Total Bilirubin: 0.5 mg/dL (ref 0.3–1.2)
Total Protein: 5.1 g/dL — ABNORMAL LOW (ref 6.5–8.1)

## 2021-09-14 MED ORDER — OXYCODONE HCL 5 MG PO TABS: 5.0000 mg | ORAL_TABLET | Freq: Four times a day (QID) | ORAL | 0 refills | Status: DC | PRN

## 2021-09-14 MED ORDER — HEPARIN SOD (PORK) LOCK FLUSH 100 UNIT/ML IV SOLN
500.0000 [IU] | Freq: Once | INTRAVENOUS | Status: AC
  Administered 2021-09-14: 11:00:00 500 [IU] via INTRAVENOUS
  Filled 2021-09-14: qty 5

## 2021-09-14 MED ORDER — SODIUM CHLORIDE 0.9% FLUSH
10.0000 mL | Freq: Once | INTRAVENOUS | Status: AC
  Administered 2021-09-14: 10:00:00 10 mL via INTRAVENOUS
  Filled 2021-09-14: qty 10

## 2021-09-14 NOTE — Telephone Encounter (Signed)
RN submitted PA required for patient's oxycodone  [facility] Key: [id number] - PA Case ID: [id number] - Rx #: [id number]  Pending insurance approval

## 2021-09-14 NOTE — Progress Notes (Signed)
Hematology/Oncology Progress Note   Patient Care Team: Adin Hector, MD as PCP - General (Internal Medicine) Earlie Server, MD as Consulting Physician (Hematology and Oncology) Clent Jacks, RN as Oncology Nurse Navigator  REFERRING PROVIDER: Adin Hector, MD   CHIEF COMPLAINTS/REASON FOR VISIT:  Malignant ascites, ovarian neoplasm, pulmonary embolism  HISTORY OF PRESENTING ILLNESS: Brittany Montoya is a  60 y.o.  female with PMH listed below was seen in consultation at the request of  Adin Hector, MD  for evaluation of complicated ovarian cyst, ascites  Patient has noticed generalized abdominal distention and bloating, nausea and constipation for the past months and  03/23/2021, CT abdomen pelvis with contrast showed large solid and cystic right ovarian mass measuring 14.1 x 18.1 x 16 cm highly suspicious for primary ovarian malignancy.  Evidence of peritoneal spread.  Moderate associated ascites. Slightly small liver with mild nodular contour suggesting a degree of cirrhosis.  04/03/2021, CA125 70.9, Hg 4 400 09.  04/04/2021, was seen by gynecology Dr. Ouida Sills.  And was referred to establish care with GYN oncology. 04/09/2021, patient underwent paracentesis and had 6.3 L of hazy yellow fluid removed.  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 Theora Gianotti and me.  Case was discussed on Gynonc tumor board on 04/25/2021. Consensus reached upon clinical diagnosis of locally advanced Stage II/III Ovarian Cancer, plan neoadjuvant chemotherapy followed by debulking surgery.   Patient also has noticed bilateral lower extremity edema, progressively worsening.  Fatigued, shortness of breath with exertion.  Denies any chest pain, cough.  Poor oral intake due to decreased appetite She was accompanied by her husband. Denies any alcohol use or previous hepatitis infection.  She is not aware about cirrhosis. She reports some  symptom relief after the paracentesis.  Her abdomen seems to got better and getting worse again.  04/19/21- carbo AUC 5- taxol 135 mg/m2 05/10/21- carbo AUC 6 - taxol 150 mg/m2; reaction to taxol- discontinued 06/07/21- carbo AUC 6 - abraxane 260 mg/m2  # CA125/CEA ratio is <25,  06/29/2021 colonoscopy showed non bleeding internal hemorrhoids. Diverticulosis,  Distal transverse colon 4 mm polyp, resected and retrieved.-Pathology showed polypoid colonic mucosa with intramucosal lymphoid aggregate.  Negative for malignancy and dysplasia. Upper endoscopy showed esophageal mucosal changes suspicious for eosinophilic esophagitis.  Biopsied-pathology showed reflux esophagitis.  Negative for increased eosinophils..  Benign-appearing esophageal stenosis.  Hiatal hernia. 06/01/2021, patient has US abdomen paracentesis and had 4 L of fluid removed.  #06/25/2021 1. Redemonstrated large, mixed solid and cystic mass of the right ovary, which is slightly diminished in size. 2. Peritoneal and omental thickening and nodularity is improved compared to prior examination, although still present. 3. Findings are consistent with treatment response of primary ovarian malignancy and peritoneal and omental metastatic disease. 4. Moderate volume ascites throughout the abdomen and pelvis, similar in volume to prior. 5. No evidence of metastatic disease in the chest.  06/26/2021 therapeutic paracentesis.    06/27/2021 seen by Dr.Secord. patient is deconditioned. Dr.Secord recommend to continue neoadjuvant chemotherapy and optimize her health status with physical therapy and dietitian.   07/23/2021, ultrasound guided paracentesis, 4.1 L removed.    INTERVAL HISTORY Brittany Montoya is a 60 y.o. female with above history who returns to clinic for follow-up.    She is scheduled to be seen at [facility] on 09/18/2021 for preadmission testing and discussion of surgery possibly scheduled for 09/20/2021.  Patient is accompanied by  one  of her daughters today.    In the interim, she was admitted to the hospital at [facility] on 09/07/2021 for concerns of small bowel obstruction, ascites, acute kidney injury and relative hypotension, and anemia.  She underwent a paracentesis which removed 4.6 L of fluid which was positive for malignancy.  She received empiric Levaquin and was started on Rocephin.  Cultures were negative.  Creatinine was 2.13 on presentation and improved with gentle hydration to 1.43.  Baseline creatinine is around 0.89.  Antihypertensive medications were discontinued.  Hemoglobin on admission was 6.9 and she received 1 unit of PRBCs.  Hypokalemia and hyponatremia.  Sodium was 125 on presentation and improved to 128 with IV fluids and discontinuation of hydrochlorothiazide.  She received electrolyte replacement for hypomagnesemia.  LFTs were elevated on admission.  Additionally had leukocytosis.  She continued Eliquis for history of PE.  Today, patient says that she feels like the abdominal fluid has reaccumulated.  Has known hiatal hernia.  Says she is in such severe pain at times that she vomits.  No improvement from antiemetics or tramadol so she is discontinued both.  She is often unable to keep down her antiemetics. Appetite is down. Feels very weak. Short of breath with exertion. Has edema particularly of lower extremities.  Previously, she underwent physical therapy in anticipation of surgery and to improve performance status. Patient reports no change in her performance status. She says the pain is what bothers her the most today.   She underwent a transjugular liver biopsy with pressures on 09/05/2021 at [facility].  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  No significant inflammation or evidence of cirrhosis.  She was seen by Dr. Damita Lack at [facility] says free hepatic pressures higher than typically seen.  This can be seen in the setting of volume overload and right-sided heart failure.   Cardiology consult was recommended.  Review of Systems  Constitutional:  Positive for fatigue. Negative for appetite change, chills and unexpected weight change.  HENT:   Negative for mouth sores, sore throat and trouble swallowing.   Respiratory:  Positive for shortness of breath. Negative for chest tightness and cough.   Cardiovascular:  Negative for leg swelling.  Gastrointestinal:  Positive for abdominal distention, abdominal pain, nausea and vomiting. Negative for constipation and diarrhea.  Genitourinary:  Negative for bladder incontinence, dysuria, vaginal bleeding and vaginal discharge.   Musculoskeletal:  Negative for flank pain and neck stiffness.  Skin:  Negative for itching, rash and wound.  Neurological:  Negative for dizziness, headaches, light-headedness and numbness.  Hematological:  Negative for adenopathy. Does not bruise/bleed easily.  Psychiatric/Behavioral:  Positive for depression. Negative for confusion and sleep disturbance. The patient is not nervous/anxious.    MEDICAL HISTORY:  Past Medical History:  Diagnosis Date   Anxiety    Arthritis    Ascites    Cancer associated pain    Cirrhosis of liver ([place])    DDD (degenerative disc disease), cervical    Depression    Family history of breast cancer    GERD (gastroesophageal reflux disease)    Glaucoma    Headache    migraines   Hypertension    Ovarian cancer ([place]) 04/15/2021   PONV (postoperative nausea and vomiting)    Pulmonary embolism ([place])    Sleep apnea    Venous stasis     SURGICAL HISTORY: Past Surgical History:  Procedure Laterality Date   ABDOMINAL HYSTERECTOMY N/A 2008   may have  had left ovary removed   [place]     2012, 2015, 2020   COLONOSCOPY N/A 06/29/2021   Procedure: COLONOSCOPY;  Surgeon: Toledo, Benay Pike, MD;  Location: [facility] ENDOSCOPY;  Service: Gastroenterology;  Laterality: N/A;  DOCTORS ARE IN AGREEMENT THAT  TOLEDO CAN DO THIS PROCEDURE IN RUSSO'S BLOCK   ESOPHAGOGASTRODUODENOSCOPY N/A 06/29/2021   Procedure: ESOPHAGOGASTRODUODENOSCOPY (EGD);  Surgeon: Toledo, Benay Pike, MD;  Location: [facility] ENDOSCOPY;  Service: Gastroenterology;  Laterality: N/A;   PARACENTESIS  04/20/2021   PILONIDAL CYST / SINUS EXCISION     PORTACATH PLACEMENT Right 06/01/2021   Procedure: INSERTION PORT-A-CATH;  Surgeon: Herbert Pun, MD;  Location: [facility] ORS;  Service: General;  Laterality: Right;    SOCIAL HISTORY: Social History   Socioeconomic History   Marital status: Married    Spouse name: Barnabas Lister   Number of children: 2   Years of education: Not on file   Highest education level: Not on file  Occupational History   Not on file  Tobacco Use   Smoking status: Never   Smokeless tobacco: Never  Vaping Use   Vaping Use: Never used  Substance and Sexual Activity   Alcohol use: No   Drug use: No   Sexual activity: Not Currently    Birth control/protection: Surgical  Other Topics Concern   Not on file  Social History Narrative   Not on file   Social Determinants of Health   Financial Resource Strain: Not on file  Food Insecurity: Not on file  Transportation Needs: Not on file  Physical Activity: Not on file  Stress: Not on file  Social Connections: Not on file  Intimate Partner Violence: Not on file    FAMILY HISTORY: Family History  Problem Relation Age of Onset   Breast cancer Mother 20   Breast cancer Maternal Aunt        d. under 68    ALLERGIES:  is allergic to paclitaxel, amlodipine, and penicillins.  MEDICATIONS:  Current Outpatient Medications  Medication Sig Dispense Refill   acetaminophen (TYLENOL) 500 MG tablet Take 1,000 mg by mouth at bedtime.     albuterol (VENTOLIN HFA) 108 (90 Base) MCG/ACT inhaler Inhale 1-2 puffs into the lungs every 6 (six) hours as needed for wheezing or shortness of breath.     apixaban (ELIQUIS) 5 MG TABS tablet Take 1 tablet (5 mg total) by  mouth 2 (two) times daily. 60 tablet 0   clonazePAM (KLONOPIN) 0.5 MG tablet Take 1 tablet (0.5 mg total) by mouth 2 (two) times daily. 30 tablet 0   DULoxetine (CYMBALTA) 30 MG capsule Take 1 capsule (30 mg total) by mouth daily. 30 capsule 1   feeding supplement (ENSURE ENLIVE / ENSURE PLUS) LIQD Take 237 mLs by mouth 2 (two) times daily between meals. 14220 mL 12   hydrocortisone butyrate (LUCOID) 0.1 % CREA cream Apply 1 application topically daily. 45 g 0   levofloxacin (LEVAQUIN) 500 MG tablet Take 1 tablet (500 mg total) by mouth daily for 7 days. 7 tablet 0   lidocaine-prilocaine (EMLA) cream Apply to affected area once (Patient taking differently: Apply 1 application topically daily as needed (pain). Apply to affected area once) 30 g 3   Multiple Vitamin (MULTIVITAMIN WITH MINERALS) TABS tablet Take 1 tablet by mouth daily.     nystatin (MYCOSTATIN) 100000 UNIT/ML suspension Use as directed 5 mLs (500,000 Units total) in the mouth  or throat 4 (four) times daily. 473 mL 0   ondansetron (ZOFRAN) 8 MG tablet Take 1 tablet (8 mg total) by mouth 2 (two) times daily as needed for refractory nausea / vomiting. Start on day 3 after carboplatin chemo. 30 tablet 1   potassium chloride SA (KLOR-CON) 20 MEQ tablet Take 1 tablet (20 mEq total) by mouth 2 (two) times daily. 60 tablet 0   prochlorperazine (COMPAZINE) 10 MG tablet Take 1 tablet (10 mg total) by mouth every 6 (six) hours as needed (Nausea or vomiting). 30 tablet 1   promethazine (PHENERGAN) 25 MG suppository Place 1 suppository (25 mg total) rectally every 6 (six) hours as needed for nausea or vomiting. 12 each 0   traMADol (ULTRAM) 50 MG tablet Take 50 mg by mouth every 6 (six) hours as needed.     No current facility-administered medications for this visit.   Facility-Administered Medications Ordered in Other Visits  Medication Dose Route Frequency Provider Last Rate Last Admin   heparin lock flush 100 unit/mL  500 Units Intravenous  Once Verlon Au, NP       LORazepam (ATIVAN) injection 0.5 mg  0.5 mg Intravenous Once PRN Earlie Server, MD       sodium chloride flush (NS) 0.9 % injection 10 mL  10 mL Intravenous Once Verlon Au, NP        PHYSICAL EXAMINATION: ECOG PERFORMANCE STATUS: 2 - Symptomatic, <50% confined to bed Vitals:   09/14/21 1016  BP: (!) 88/47  Pulse: (!) 116  Resp: 16  Temp: 98.6 F (37 C)  SpO2: 99%    Physical Exam Constitutional:      General: She is not in acute distress.    Appearance: She is well-developed. She is ill-appearing.     Comments: Chronically ill appearing.   HENT:     Head: Atraumatic.     Nose: Nose normal.     Mouth/Throat:     Pharynx: No oropharyngeal exudate.  Eyes:     General: No scleral icterus.    Conjunctiva/sclera: Conjunctivae normal.  Cardiovascular:     Rate and Rhythm: Normal rate and regular rhythm.  Pulmonary:     Comments: Diminished bilaterally Abdominal:     General: Bowel sounds are normal. There is no distension.     Tenderness: There is no abdominal tenderness. There is no guarding.     Comments: Mild firmness at upper abdomen. Otherwise soft and nontender.   Musculoskeletal:        General: No deformity.     Right lower leg: Edema present.     Left lower leg: Edema present.  Skin:    General: Skin is warm and dry.     Coloration: Skin is not pale.  Neurological:     General: No focal deficit present.     Mental Status: She is alert and oriented to person, place, and time.  Psychiatric:        Mood and Affect: Mood normal.        Behavior: Behavior normal.    LABORATORY DATA:  I have reviewed the data as listed Lab Results  Component Value Date   WBC 26.1 (H) 09/14/2021   HGB 9.3 (L) 09/14/2021   HCT 28.0 (L) 09/14/2021   MCV 96.9 09/14/2021   PLT 320 09/14/2021   Recent Labs    09/07/21 1021 09/07/21 2029 09/08/21 0815 09/14/21 1011  NA 125* 128* 128* 127*  K 3.3* 3.1* 3.7 3.9  CL 93*  103 101 94*  CO2 20* 20*  22 21*  GLUCOSE 116* 94 100* 121*  BUN 26* 22 21 17   CREATININE 2.13* 1.63* 1.43* 1.56*  CALCIUM 9.0 8.4* 8.7* 8.8*  GFRNONAA 26* 36* 42* 38*  PROT 5.6*  --  4.9* 5.1*  ALBUMIN 2.3*  --  2.6* 1.9*  AST 53*  --  31 32  ALT 30  --  17 11  ALKPHOS 141*  --  111 123  BILITOT 0.3  --  0.4 0.5   Iron/TIBC/Ferritin/ %Sat    Component Value Date/Time   IRON 50 08/17/2021 0828   TIBC 186 (L) 08/17/2021 0828   FERRITIN 636 (H) 08/17/2021 0828   IRONPCTSAT 27 08/17/2021 0828      RADIOGRAPHIC STUDIES: I have personally reviewed the radiological images as listed and agreed with the findings in the report. DG Abd 1 View  Result Date: 09/07/2021 CLINICAL DATA:  Nausea, bloating EXAM: ABDOMEN - 1 VIEW COMPARISON:  09/02/2021 FINDINGS: The bowel gas pattern is normal. Cholecystectomy clips. Pelvic phleboliths. Minimal spurring in the lumbar spine. IMPRESSION: Normal bowel gas pattern.  No acute findings. Electronically Signed   By: Lucrezia Europe M.D.   On: 09/07/2021 10:39   DG Abd 1 View  Result Date: 09/02/2021 CLINICAL DATA:  60 year old female with history of nausea and vomiting. History of ovarian cancer. EXAM: ABDOMEN - 1 VIEW COMPARISON:  No priors. FINDINGS: The bowel gas pattern is normal. No radio-opaque calculi or other significant radiographic abnormality are seen. Surgical clips project over the right upper quadrant of the abdomen, likely from prior cholecystectomy. IMPRESSION: 1. Nonobstructive bowel gas pattern. 2. No pneumoperitoneum. Electronically Signed   By: Vinnie Langton M.D.   On: 09/02/2021 06:06   CT ABDOMEN PELVIS W CONTRAST  Result Date: 08/31/2021 CLINICAL DATA:  Abdominal pain, history of ovarian cancer EXAM: CT ABDOMEN AND PELVIS WITH CONTRAST TECHNIQUE: Multidetector CT imaging of the abdomen and pelvis was performed using the standard protocol following bolus administration of intravenous contrast. CONTRAST:  146mL OMNIPAQUE IOHEXOL 300 MG/ML  SOLN COMPARISON:  CT  abdomen and pelvis dated June 25, 2021 FINDINGS: Lower chest: No acute abnormality. Hepatobiliary: No focal liver abnormality is seen. Status post cholecystectomy. No biliary dilatation. In Pancreas: Unremarkable. No pancreatic ductal dilatation or surrounding inflammatory changes. Spleen: Normal in size without focal abnormality. Adrenals/Urinary Tract: Adrenal glands are unremarkable. Kidneys are normal, without renal calculi, focal lesion, or hydronephrosis. Bladder is unremarkable. Stomach/Bowel: Stomach is within normal limits. Appendix appears normal. No evidence of bowel wall thickening, distention, or inflammatory changes. Vascular/Lymphatic: Aortic atherosclerosis. No enlarged abdominal or pelvic lymph nodes. Reproductive: Uterus is surgically absent. Unchanged appearance of large cystic and solid mass of the right ovary, measures approximately 1.9 x 1.3 cm, when measured on coronal image, unchanged in size when remeasured in similar plane on prior exam. Other: Small fluid containing umbilical hernia. Similar peritoneal thickening and nodularity. Small to moderate volume abdominal ascites, slightly decreased when compared to the prior exam. Musculoskeletal: No acute or significant osseous findings. IMPRESSION: 1. No acute findings in the abdomen or pelvis. 2. Unchanged appearance of large cystic and solid mass of the right ovary. 3. Similar peritoneal thickening and nodularity. 4. Small to moderate volume abdominal ascites, slightly decreased when compared with prior exam. 5.  Aortic Atherosclerosis (ICD10-I70.0). Electronically Signed   By: Yetta Glassman M.D.   On: 08/31/2021 12:24   US Paracentesis  Result Date: 09/13/2021 INDICATION: ascites EXAM: ULTRASOUND GUIDED  PARACENTESIS MEDICATIONS:  None. COMPLICATIONS: None immediate. PROCEDURE: Informed written consent was obtained from the patient after a discussion of the risks, benefits and alternatives to treatment. A timeout was performed prior  to the initiation of the procedure. Initial ultrasound scanning demonstrates a large amount of ascites within the right lower abdominal quadrant. The right lower abdomen was prepped and draped in the usual sterile fashion. 1% lidocaine was used for local anesthesia. Following this, a 19 gauge, 10-cm, Yueh catheter was introduced. An ultrasound image was saved for documentation purposes. The paracentesis was performed. The catheter was removed and a dressing was applied. The patient tolerated the procedure well without immediate post procedural complication. FINDINGS: A total of approximately 4.0 of dark amber fluid was removed. IMPRESSION: Successful ultrasound-guided paracentesis yielding approximately 4.0 liters of dark amber peritoneal fluid. Electronically Signed   By: Albin Felling M.D.   On: 09/13/2021 08:31   US Paracentesis  Result Date: 09/07/2021 INDICATION: History of ovarian cancer. Please perform ultrasound-guided paracentesis for therapeutic purposes. EXAM: ULTRASOUND-GUIDED PARACENTESIS COMPARISON:  Multiple previous ultrasound-guided paracenteses, most recently on 08/31/2021 yielding sphenoid cc of ascitic fluid MEDICATIONS: None. COMPLICATIONS: None immediate. TECHNIQUE: Informed written consent was obtained from the patient after a discussion of the risks, benefits and alternatives to treatment. A timeout was performed prior to the initiation of the procedure. Initial ultrasound scanning demonstrates a large amount of ascites within the right lower abdomen which was subsequently prepped and draped in the usual sterile fashion. 1% lidocaine with epinephrine was used for local anesthesia. Under direct ultrasound guidance, a 19 gauge, 7-cm, Yueh catheter was introduced. An ultrasound image was saved for documentation purposed. The paracentesis was performed. The catheter was removed and a dressing was applied. The patient tolerated the procedure well without immediate post procedural  complication. FINDINGS: A total of approximately 4.6 liters of blood-tinged serous fluid was removed. Samples were sent to the laboratory as requested by the clinical team. IMPRESSION: Successful ultrasound-guided paracentesis yielding 4.6 liters of peritoneal fluid. Electronically Signed   By: Sandi Mariscal M.D.   On: 09/07/2021 16:29   US Paracentesis  Result Date: 08/31/2021 INDICATION: Patient with history of ovarian cancer, recurrent ascites, new onset abdominal pain following paracentesis 08/29/2021. Request to IR for diagnostic and therapeutic paracentesis EXAM: ULTRASOUND GUIDED DIAGNOSTIC AND THERAPEUTIC PARACENTESIS MEDICATIONS: 8 mL 1% lidocaine COMPLICATIONS: None immediate. PROCEDURE: Informed written consent was obtained from the patient after a discussion of the risks, benefits and alternatives to treatment. A timeout was performed prior to the initiation of the procedure. Initial ultrasound scanning demonstrates a small amount of ascites within the right upper abdominal quadrant. The right upper abdomen was prepped and draped in the usual sterile fashion. 1% lidocaine was used for local anesthesia. Following this, a 19 gauge, 7-cm, Yueh catheter was introduced. An ultrasound image was saved for documentation purposes. The paracentesis was performed. The catheter was removed and a dressing was applied. The patient tolerated the procedure well without immediate post procedural complication. FINDINGS: A total of approximately 300 mL of clear, dark serosanguineous fluid was removed. Samples were sent to the laboratory as requested by the clinical team. IMPRESSION: Successful ultrasound-guided paracentesis yielding 300 milliliters of peritoneal fluid. Read by Candiss Norse, PA-C Electronically Signed   By: Miachel Roux M.D.   On: 08/31/2021 14:48   US Paracentesis  Result Date: 08/29/2021 INDICATION: Patient with history of ovarian cancer recurrent ascites request for paracentesis. EXAM:  ULTRASOUND GUIDED  PARACENTESIS MEDICATIONS: Local 1% lidocaine only. COMPLICATIONS: None immediate. PROCEDURE:  Informed written consent was obtained from the patient after a discussion of the risks, benefits and alternatives to treatment. A timeout was performed prior to the initiation of the procedure. Initial ultrasound scanning demonstrates a large amount of ascites within the left lower abdominal quadrant. The left lower abdomen was prepped and draped in the usual sterile fashion. 1% lidocaine was used for local anesthesia. Following this, a 19 gauge, 7-cm, Yueh catheter was introduced. An ultrasound image was saved for documentation purposes. The paracentesis was performed. The catheter was removed and a dressing was applied. The patient tolerated the procedure well without immediate post procedural complication. FINDINGS: A total of approximately 4 L of amber colored fluid was removed. IMPRESSION: Successful ultrasound-guided paracentesis yielding 4 liters of peritoneal fluid. This exam was performed by Tsosie Billing PA-C, and was supervised and interpreted by Dr. Vernard Gambles. Electronically Signed   By: Lucrezia Europe M.D.   On: 08/29/2021 16:23   US Paracentesis  Result Date: 08/22/2021 INDICATION: Patient with history of ovarian cancer and recurrent ascites request for paracentesis. EXAM: ULTRASOUND GUIDED  PARACENTESIS MEDICATIONS: Local 1% lidocaine only. COMPLICATIONS: None immediate. PROCEDURE: Informed written consent was obtained from the patient after a discussion of the risks, benefits and alternatives to treatment. A timeout was performed prior to the initiation of the procedure. Initial ultrasound scanning demonstrates a large amount of ascites within the right lower abdominal quadrant. The right lower abdomen was prepped and draped in the usual sterile fashion. 1% lidocaine was used for local anesthesia. Following this, a 19 gauge, 7-cm, Yueh catheter was introduced. An ultrasound image was saved  for documentation purposes. The paracentesis was performed. The catheter was removed and a dressing was applied. The patient tolerated the procedure well without immediate post procedural complication. FINDINGS: A total of approximately 4 liters of amber colored fluid was removed. IMPRESSION: Successful ultrasound-guided paracentesis yielding 4 liters of peritoneal fluid. This exam was performed by Tsosie Billing PA-C, and was supervised and interpreted by Dr. Pascal Lux. Electronically Signed   By: Sandi Mariscal M.D.   On: 08/22/2021 16:38   US Paracentesis  Result Date: 08/16/2021 INDICATION: 60 year old female with history of ovarian cancer with recurrent ascites presenting for paracentesis. EXAM: ULTRASOUND GUIDED  PARACENTESIS MEDICATIONS: None. COMPLICATIONS: None immediate. PROCEDURE: Informed written consent was obtained from the patient after a discussion of the risks, benefits and alternatives to treatment. A timeout was performed prior to the initiation of the procedure. Initial ultrasound scanning demonstrates a large amount of ascites within the right lower abdominal quadrant. The right lower abdomen was prepped and draped in the usual sterile fashion. 1% lidocaine was used for local anesthesia. Following this, a 6 Fr Safe-T-Centesis catheter was introduced. An ultrasound image was saved for documentation purposes. The paracentesis was performed. The catheter was removed and a dressing was applied. The patient tolerated the procedure well without immediate post procedural complication. FINDINGS: A total of approximately 4 of translucent, amber fluid was removed. IMPRESSION: Successful ultrasound-guided paracentesis yielding 4 liters of peritoneal fluid. Ruthann Cancer, MD [facility] Radiology Electronically Signed   By: Ruthann Cancer M.D.   On: 08/16/2021 16:25      ASSESSMENT & PLAN:  No diagnosis found.  Locally advanced ovarian Cancer - probable  advanced ovarian cancer s/p 6 cycles of carbo-taxol/abraxane chemotherapy with plan for debulking surgery on 09/20/21 at [facility]. Her last chemo was on 08/17/21. My feeling is that she is not currently a good candidate for surgery based on  her generalized poor performance status, hypoalbuminemia. I calculated her surgical risk using the ACS NSQIP calculator and she has an above average risk of poor outcomes: 85.4% of serious complication, 62.7% risk of any complication. I will reach out to Dr. Theora Gianotti to discuss this and get her recommendations. Anticipate that she will recommend additional chemotherapy while we continue to try to optimize her medical comorbidities and performance status.   S/p liver biopsy- s/p transjugular liver biopsy with pressures on 09/05/21 with Dr. Damita Lack at [facility]. Pathology was negative for cirrhosis or malignancy. Free hepatic pressures were higher than typically seen and per Dr. Damita Lack this can occur in setting of volume overload and right sided heart failure.   DOE- generalized malaise, SOB at rest and edema. Recommend echo to evaluate for cardiac etiology particularly in setting of liver findings (see above). Will refer to cardiology for evaluation.   Ascites- s/p paracentesis of 4.6 L on 09/07/21. cytology has been consistent with malignancy which is expected given her peritoneal disease. However, her ca125 has improved and she persistently re-accumulates. Given liver findings and her overall convalescence I question cardiac etiology contributing.If surgery on hold, she would likely benefit from pleurx catheter.   Hiatal hernia- contributing to abdominal pressure & vomiting most likely. May consider repair if she is surgical candidate in the future.   Hypoalbuminemia- related to poor nutrition, decreased performance status, and frequent paracentesis. Can consider giving albumin with paracentesis though may not yield significant results.   Hypotension- hold antiemetics. Refer to  cardiology. Echo asap.   Neoplasm related pain- imaging shows patient has bulky mass that is persistent post chemo. uncontrolled on tramadol. Start oxycodone 5-10 mg q6h prn for pain. Reviewed bowel regimen of miralax 1-2 times a day with senna 1-2 tablets once a day to achieve soft bowel movement daily.   Anemia- likely chemotherapy induced. Ferritin and iron studies were normal. B12 and folate also normal. Hemoglobin dropped to 6.9 on 09/08/21 and she received 1 unit of pRBCs. Hemoglobin is now 9.3. Reviewed that ideally hemoglobin > 10 prior to surgery and may consider transfusion should plan be to proceed with surgery.   Thrombocytopenia- normalized. Monitor.   Anticoagulation- for pulmonary embolism. Eliquis was held during her recent admission, and also prior to liver biopsy, she has resumed after procedure.   Intractable nausea and vomiting- secondary to pain. No obstruction. Hiatal hernia and ascites likely contributing as well. She has stopped antiemetics due to lack of effect.   Dehydration, AKI- s/p hospitalization and gentle hydration. Creatinine was 2.13 during hospitalization. Improved. Encouraged oral hydration. Will hold iv fluids in setting of edema, sob.   Goals of care- will refer to [place] to assist with ongoing symptom management.   All questions were answered. The patient knows to call the clinic with any problems questions or concerns.  Follow up plan:  Echo Ref to palliative care Ref to cardiology  See Dr Tasia Catchings for port/labs and carbo-abraxane Follow up with gyn-onc based on results  We spent sufficient time to discuss many aspect of care, questions were answered to patient's satisfaction.  CC:

## 2021-09-14 NOTE — Telephone Encounter (Signed)
PA approved by insurance.

## 2021-09-14 NOTE — Progress Notes (Signed)
Stays nauseated and bloated all the time. Has vomited every day since leaving hospital, vomits green to brown bile (liquids). Drank a small amount of ensure clear this am and has kept it down. Took senna for constipation then gets diarrhea type stools. Has a very large amount of bilateral leg edema up to her hips.Can't get shoes on. Hands are edematous. Feels weak. BP is low. Uses walker at home.

## 2021-09-15 ENCOUNTER — Encounter: Payer: Self-pay | Admitting: Nurse Practitioner

## 2021-09-18 ENCOUNTER — Other Ambulatory Visit: Payer: Self-pay | Admitting: Nurse Practitioner

## 2021-09-18 ENCOUNTER — Telehealth: Payer: Self-pay | Admitting: Nurse Practitioner

## 2021-09-18 ENCOUNTER — Telehealth: Payer: Self-pay

## 2021-09-18 ENCOUNTER — Encounter: Payer: Self-pay | Admitting: Oncology

## 2021-09-18 DIAGNOSIS — C569 Malignant neoplasm of unspecified ovary: Secondary | ICD-10-CM

## 2021-09-18 DIAGNOSIS — R0600 Dyspnea, unspecified: Secondary | ICD-10-CM

## 2021-09-18 DIAGNOSIS — R188 Other ascites: Secondary | ICD-10-CM

## 2021-09-18 NOTE — Telephone Encounter (Signed)
Spoke to Dr. Theora Gianotti regarding patient's status. She recommends holding off on surgery at this time. Recommends cardiology consult and echo. Echo has previously been scheduled. Will send referral to cardiology.   Spoke to patient to review. Patient is agreeable. Dr. Theora Gianotti recommends proceeding with chemotherapy in the meantime as patient has had good response as evidenced by decreased ca-125. Patient reports ongoing vomiting and now believes it is not correlating with episodes of pain. Question if worsened by known hiatal hernia and increasing ascites. Will reach out to palliative care to assist with managing patient and possible pleurx as symptoms unrelieved by antiemetics. I question that this is structural etiology worsened by recurrent ascites. She has had improvement in her ca 125 which makes bulky disease etiology less likely as well as symptoms occur despite having had chemo several weeks ago. While she has known malignancy in ascitic fluid I question if underlying etiology is cardiac given her other symptoms.   Will discuss with Dr. Tasia Catchings to restart chemotherapy.

## 2021-09-18 NOTE — Progress Notes (Signed)
Referral send to cardiology to evaluate progressive dyspnea.

## 2021-09-18 NOTE — Telephone Encounter (Signed)
Please schedule patient for lab/MD/ Carbo/ abraxane this week if possible. Please inform pt of appt.

## 2021-09-19 ENCOUNTER — Inpatient Hospital Stay: Payer: BC Managed Care – PPO | Admitting: Oncology

## 2021-09-19 ENCOUNTER — Other Ambulatory Visit: Payer: Self-pay | Admitting: Oncology

## 2021-09-19 ENCOUNTER — Inpatient Hospital Stay: Payer: BC Managed Care – PPO

## 2021-09-19 DIAGNOSIS — T451X5A Adverse effect of antineoplastic and immunosuppressive drugs, initial encounter: Secondary | ICD-10-CM

## 2021-09-19 DIAGNOSIS — C561 Malignant neoplasm of right ovary: Secondary | ICD-10-CM

## 2021-09-19 DIAGNOSIS — I2699 Other pulmonary embolism without acute cor pulmonale: Secondary | ICD-10-CM

## 2021-09-19 DIAGNOSIS — D701 Agranulocytosis secondary to cancer chemotherapy: Secondary | ICD-10-CM

## 2021-09-19 DIAGNOSIS — Z5111 Encounter for antineoplastic chemotherapy: Secondary | ICD-10-CM

## 2021-09-19 DIAGNOSIS — C569 Malignant neoplasm of unspecified ovary: Secondary | ICD-10-CM

## 2021-09-19 DIAGNOSIS — R180 Malignant ascites: Secondary | ICD-10-CM

## 2021-09-19 DIAGNOSIS — D649 Anemia, unspecified: Secondary | ICD-10-CM

## 2021-09-20 ENCOUNTER — Other Ambulatory Visit: Payer: Self-pay

## 2021-09-20 ENCOUNTER — Telehealth: Payer: Self-pay

## 2021-09-20 DIAGNOSIS — R188 Other ascites: Secondary | ICD-10-CM

## 2021-09-20 NOTE — Telephone Encounter (Signed)
I called to inform patient that we got her scheduled for her paracentesis for 09/20/21 at 2pm

## 2021-09-21 ENCOUNTER — Other Ambulatory Visit: Payer: Self-pay

## 2021-09-21 ENCOUNTER — Encounter: Payer: Self-pay | Admitting: Emergency Medicine

## 2021-09-21 ENCOUNTER — Inpatient Hospital Stay: Payer: BC Managed Care – PPO | Admitting: Hospice and Palliative Medicine

## 2021-09-21 ENCOUNTER — Emergency Department: Payer: BC Managed Care – PPO

## 2021-09-21 ENCOUNTER — Inpatient Hospital Stay: Payer: BC Managed Care – PPO

## 2021-09-21 ENCOUNTER — Inpatient Hospital Stay (HOSPITAL_BASED_OUTPATIENT_CLINIC_OR_DEPARTMENT_OTHER): Payer: BC Managed Care – PPO | Admitting: Oncology

## 2021-09-21 ENCOUNTER — Telehealth: Payer: Self-pay | Admitting: *Deleted

## 2021-09-21 ENCOUNTER — Encounter: Payer: Self-pay | Admitting: Oncology

## 2021-09-21 ENCOUNTER — Ambulatory Visit: Admission: RE | Admit: 2021-09-21 | Payer: BC Managed Care – PPO | Source: Ambulatory Visit

## 2021-09-21 ENCOUNTER — Inpatient Hospital Stay: Payer: BC Managed Care – PPO | Attending: Nurse Practitioner

## 2021-09-21 ENCOUNTER — Inpatient Hospital Stay: Payer: BC Managed Care – PPO | Attending: Oncology

## 2021-09-21 ENCOUNTER — Inpatient Hospital Stay
Admission: EM | Admit: 2021-09-21 | Discharge: 2021-09-25 | DRG: 683 | Disposition: A | Discharge status: Home Health | Payer: BC Managed Care – PPO | Attending: Hospitalist | Admitting: Hospitalist

## 2021-09-21 ENCOUNTER — Observation Stay: Payer: BC Managed Care – PPO

## 2021-09-21 VITALS — BP 89/66 | HR 105 | Temp 97.1°F | Wt 180.0 lb

## 2021-09-21 DIAGNOSIS — K7460 Unspecified cirrhosis of liver: Secondary | ICD-10-CM | POA: Diagnosis not present

## 2021-09-21 DIAGNOSIS — Z86718 Personal history of other venous thrombosis and embolism: Secondary | ICD-10-CM | POA: Diagnosis not present

## 2021-09-21 DIAGNOSIS — E46 Unspecified protein-calorie malnutrition: Secondary | ICD-10-CM | POA: Insufficient documentation

## 2021-09-21 DIAGNOSIS — D6481 Anemia due to antineoplastic chemotherapy: Secondary | ICD-10-CM | POA: Insufficient documentation

## 2021-09-21 DIAGNOSIS — E8770 Fluid overload, unspecified: Secondary | ICD-10-CM | POA: Diagnosis present

## 2021-09-21 DIAGNOSIS — H409 Unspecified glaucoma: Secondary | ICD-10-CM | POA: Diagnosis present

## 2021-09-21 DIAGNOSIS — R188 Other ascites: Secondary | ICD-10-CM | POA: Diagnosis not present

## 2021-09-21 DIAGNOSIS — R180 Malignant ascites: Secondary | ICD-10-CM | POA: Insufficient documentation

## 2021-09-21 DIAGNOSIS — Z515 Encounter for palliative care: Secondary | ICD-10-CM

## 2021-09-21 DIAGNOSIS — Z86711 Personal history of pulmonary embolism: Secondary | ICD-10-CM | POA: Insufficient documentation

## 2021-09-21 DIAGNOSIS — R112 Nausea with vomiting, unspecified: Secondary | ICD-10-CM | POA: Insufficient documentation

## 2021-09-21 DIAGNOSIS — I959 Hypotension, unspecified: Secondary | ICD-10-CM | POA: Insufficient documentation

## 2021-09-21 DIAGNOSIS — D6859 Other primary thrombophilia: Secondary | ICD-10-CM | POA: Diagnosis present

## 2021-09-21 DIAGNOSIS — N179 Acute kidney failure, unspecified: Secondary | ICD-10-CM | POA: Insufficient documentation

## 2021-09-21 DIAGNOSIS — I700 Atherosclerosis of aorta: Secondary | ICD-10-CM | POA: Diagnosis not present

## 2021-09-21 DIAGNOSIS — N1831 Chronic kidney disease, stage 3a: Secondary | ICD-10-CM | POA: Diagnosis present

## 2021-09-21 DIAGNOSIS — E8809 Other disorders of plasma-protein metabolism, not elsewhere classified: Secondary | ICD-10-CM | POA: Insufficient documentation

## 2021-09-21 DIAGNOSIS — F32A Depression, unspecified: Secondary | ICD-10-CM | POA: Diagnosis not present

## 2021-09-21 DIAGNOSIS — R600 Localized edema: Secondary | ICD-10-CM | POA: Diagnosis not present

## 2021-09-21 DIAGNOSIS — G893 Neoplasm related pain (acute) (chronic): Secondary | ICD-10-CM | POA: Diagnosis not present

## 2021-09-21 DIAGNOSIS — Z20822 Contact with and (suspected) exposure to covid-19: Secondary | ICD-10-CM | POA: Diagnosis present

## 2021-09-21 DIAGNOSIS — K429 Umbilical hernia without obstruction or gangrene: Secondary | ICD-10-CM | POA: Insufficient documentation

## 2021-09-21 DIAGNOSIS — Z7901 Long term (current) use of anticoagulants: Secondary | ICD-10-CM | POA: Diagnosis not present

## 2021-09-21 DIAGNOSIS — M910 Juvenile osteochondrosis of pelvis: Secondary | ICD-10-CM | POA: Insufficient documentation

## 2021-09-21 DIAGNOSIS — Z888 Allergy status to other drugs, medicaments and biological substances status: Secondary | ICD-10-CM

## 2021-09-21 DIAGNOSIS — R109 Unspecified abdominal pain: Secondary | ICD-10-CM | POA: Insufficient documentation

## 2021-09-21 DIAGNOSIS — N189 Chronic kidney disease, unspecified: Secondary | ICD-10-CM

## 2021-09-21 DIAGNOSIS — G4733 Obstructive sleep apnea (adult) (pediatric): Secondary | ICD-10-CM | POA: Insufficient documentation

## 2021-09-21 DIAGNOSIS — Z803 Family history of malignant neoplasm of breast: Secondary | ICD-10-CM | POA: Insufficient documentation

## 2021-09-21 DIAGNOSIS — Z79899 Other long term (current) drug therapy: Secondary | ICD-10-CM

## 2021-09-21 DIAGNOSIS — J910 Malignant pleural effusion: Secondary | ICD-10-CM | POA: Insufficient documentation

## 2021-09-21 DIAGNOSIS — I878 Other specified disorders of veins: Secondary | ICD-10-CM | POA: Diagnosis not present

## 2021-09-21 DIAGNOSIS — E860 Dehydration: Secondary | ICD-10-CM | POA: Diagnosis present

## 2021-09-21 DIAGNOSIS — R140 Abdominal distension (gaseous): Secondary | ICD-10-CM | POA: Insufficient documentation

## 2021-09-21 DIAGNOSIS — D630 Anemia in neoplastic disease: Secondary | ICD-10-CM | POA: Diagnosis present

## 2021-09-21 DIAGNOSIS — D649 Anemia, unspecified: Secondary | ICD-10-CM | POA: Diagnosis present

## 2021-09-21 DIAGNOSIS — Z880 Allergy status to penicillin: Secondary | ICD-10-CM

## 2021-09-21 DIAGNOSIS — D6959 Other secondary thrombocytopenia: Secondary | ICD-10-CM | POA: Diagnosis not present

## 2021-09-21 DIAGNOSIS — Z9221 Personal history of antineoplastic chemotherapy: Secondary | ICD-10-CM

## 2021-09-21 DIAGNOSIS — E871 Hypo-osmolality and hyponatremia: Secondary | ICD-10-CM

## 2021-09-21 DIAGNOSIS — R110 Nausea: Secondary | ICD-10-CM | POA: Insufficient documentation

## 2021-09-21 DIAGNOSIS — G4730 Sleep apnea, unspecified: Secondary | ICD-10-CM | POA: Diagnosis not present

## 2021-09-21 DIAGNOSIS — K219 Gastro-esophageal reflux disease without esophagitis: Secondary | ICD-10-CM | POA: Diagnosis present

## 2021-09-21 DIAGNOSIS — C569 Malignant neoplasm of unspecified ovary: Secondary | ICD-10-CM

## 2021-09-21 DIAGNOSIS — I10 Essential (primary) hypertension: Secondary | ICD-10-CM | POA: Diagnosis not present

## 2021-09-21 DIAGNOSIS — C561 Malignant neoplasm of right ovary: Secondary | ICD-10-CM | POA: Insufficient documentation

## 2021-09-21 DIAGNOSIS — F419 Anxiety disorder, unspecified: Secondary | ICD-10-CM | POA: Diagnosis present

## 2021-09-21 DIAGNOSIS — Z9049 Acquired absence of other specified parts of digestive tract: Secondary | ICD-10-CM | POA: Diagnosis not present

## 2021-09-21 DIAGNOSIS — R1084 Generalized abdominal pain: Secondary | ICD-10-CM | POA: Diagnosis present

## 2021-09-21 DIAGNOSIS — Z90721 Acquired absence of ovaries, unilateral: Secondary | ICD-10-CM | POA: Diagnosis not present

## 2021-09-21 DIAGNOSIS — K5900 Constipation, unspecified: Secondary | ICD-10-CM | POA: Insufficient documentation

## 2021-09-21 DIAGNOSIS — I129 Hypertensive chronic kidney disease with stage 1 through stage 4 chronic kidney disease, or unspecified chronic kidney disease: Secondary | ICD-10-CM | POA: Diagnosis present

## 2021-09-21 HISTORY — DX: Other primary thrombophilia: D68.59

## 2021-09-21 LAB — TROPONIN I (HIGH SENSITIVITY): Troponin I (High Sensitivity): 17 ng/L

## 2021-09-21 LAB — CBC WITH DIFFERENTIAL/PLATELET
Abs Immature Granulocytes: 0.23 10*3/uL — ABNORMAL HIGH (ref 0.00–0.07)
Abs Immature Granulocytes: 0.23 10*3/uL — ABNORMAL HIGH (ref 0.00–0.07)
Basophils Absolute: 0.1 10*3/uL (ref 0.0–0.1)
Basophils Absolute: 0 10*3/uL (ref 0.0–0.1)
Basophils Relative: 0 %
Basophils Relative: 0 %
Eosinophils Absolute: 0 10*3/uL (ref 0.0–0.5)
Eosinophils Absolute: 0 10*3/uL (ref 0.0–0.5)
Eosinophils Relative: 0 %
Eosinophils Relative: 0 %
HCT: 26.3 % — ABNORMAL LOW (ref 36.0–46.0)
HCT: 26.4 % — ABNORMAL LOW (ref 36.0–46.0)
Hemoglobin: 8.9 g/dL — ABNORMAL LOW (ref 12.0–15.0)
Hemoglobin: 8.8 g/dL — ABNORMAL LOW (ref 12.0–15.0)
Immature Granulocytes: 1 %
Immature Granulocytes: 1 %
Lymphocytes Relative: 3 %
Lymphocytes Relative: 3 %
Lymphs Abs: 0.7 10*3/uL (ref 0.7–4.0)
Lymphs Abs: 0.8 10*3/uL (ref 0.7–4.0)
MCH: 32.6 pg (ref 26.0–34.0)
MCH: 32.4 pg (ref 26.0–34.0)
MCHC: 33.8 g/dL (ref 30.0–36.0)
MCHC: 33.3 g/dL (ref 30.0–36.0)
MCV: 96.3 fL (ref 80.0–100.0)
MCV: 97.1 fL (ref 80.0–100.0)
Monocytes Absolute: 1.1 10*3/uL — ABNORMAL HIGH (ref 0.1–1.0)
Monocytes Absolute: 1 10*3/uL (ref 0.1–1.0)
Monocytes Relative: 4 %
Monocytes Relative: 4 %
Neutro Abs: 26 10*3/uL — ABNORMAL HIGH (ref 1.7–7.7)
Neutro Abs: 24.2 10*3/uL — ABNORMAL HIGH (ref 1.7–7.7)
Neutrophils Relative %: 92 %
Neutrophils Relative %: 92 %
Platelets: 407 10*3/uL — ABNORMAL HIGH (ref 150–400)
Platelets: 391 10*3/uL (ref 150–400)
RBC: 2.73 MIL/uL — ABNORMAL LOW (ref 3.87–5.11)
RBC: 2.72 MIL/uL — ABNORMAL LOW (ref 3.87–5.11)
RDW: 18.7 % — ABNORMAL HIGH (ref 11.5–15.5)
RDW: 18.7 % — ABNORMAL HIGH (ref 11.5–15.5)
Smear Review: NORMAL
WBC: 28.2 10*3/uL — ABNORMAL HIGH (ref 4.0–10.5)
WBC: 26.4 10*3/uL — ABNORMAL HIGH (ref 4.0–10.5)
nRBC: 0 % (ref 0.0–0.2)
nRBC: 0 % (ref 0.0–0.2)

## 2021-09-21 LAB — PHOSPHORUS: Phosphorus: 5.6 mg/dL — ABNORMAL HIGH (ref 2.5–4.6)

## 2021-09-21 LAB — COMPREHENSIVE METABOLIC PANEL
ALT: 15 U/L (ref 0–44)
AST: 37 U/L (ref 15–41)
Albumin: 2.4 g/dL — ABNORMAL LOW (ref 3.5–5.0)
Alkaline Phosphatase: 136 U/L — ABNORMAL HIGH (ref 38–126)
Anion gap: 16 — ABNORMAL HIGH (ref 5–15)
BUN: 43 mg/dL — ABNORMAL HIGH (ref 8–23)
CO2: 22 mmol/L (ref 22–32)
Calcium: 9.7 mg/dL (ref 8.9–10.3)
Chloride: 86 mmol/L — ABNORMAL LOW (ref 98–111)
Creatinine, Ser: 3.36 mg/dL — ABNORMAL HIGH (ref 0.44–1.00)
GFR, Estimated: 15 mL/min — ABNORMAL LOW (ref >60)
Glucose, Bld: 114 mg/dL — ABNORMAL HIGH (ref 70–99)
Potassium: 3.8 mmol/L (ref 3.5–5.1)
Sodium: 124 mmol/L — ABNORMAL LOW (ref 135–145)
Total Bilirubin: 0.6 mg/dL (ref 0.3–1.2)
Total Protein: 6.2 g/dL — ABNORMAL LOW (ref 6.5–8.1)

## 2021-09-21 LAB — GLUCOSE, PLEURAL OR PERITONEAL FLUID: Glucose, Fluid: 46 mg/dL

## 2021-09-21 LAB — RESP PANEL BY RT-PCR (FLU A&B, COVID) ARPGX2
Influenza A by PCR: NEGATIVE
Influenza B by PCR: NEGATIVE
SARS Coronavirus 2 by RT PCR: NEGATIVE

## 2021-09-21 LAB — AMYLASE, PLEURAL OR PERITONEAL FLUID: Amylase, Fluid: 14 U/L

## 2021-09-21 LAB — COMPREHENSIVE METABOLIC PANEL WITH GFR
ALT: 16 U/L (ref 0–44)
AST: 39 U/L (ref 15–41)
Albumin: 2.4 g/dL — ABNORMAL LOW (ref 3.5–5.0)
Alkaline Phosphatase: 127 U/L — ABNORMAL HIGH (ref 38–126)
Anion gap: 13 (ref 5–15)
BUN: 43 mg/dL — ABNORMAL HIGH (ref 8–23)
CO2: 24 mmol/L (ref 22–32)
Calcium: 9.7 mg/dL (ref 8.9–10.3)
Chloride: 90 mmol/L — ABNORMAL LOW (ref 98–111)
Creatinine, Ser: 3.3 mg/dL — ABNORMAL HIGH (ref 0.44–1.00)
GFR, Estimated: 15 mL/min — ABNORMAL LOW
Glucose, Bld: 91 mg/dL (ref 70–99)
Potassium: 4 mmol/L (ref 3.5–5.1)
Sodium: 127 mmol/L — ABNORMAL LOW (ref 135–145)
Total Bilirubin: 0.9 mg/dL (ref 0.3–1.2)
Total Protein: 5.8 g/dL — ABNORMAL LOW (ref 6.5–8.1)

## 2021-09-21 LAB — MAGNESIUM: Magnesium: 1.5 mg/dL — ABNORMAL LOW (ref 1.7–2.4)

## 2021-09-21 LAB — BODY FLUID CELL COUNT WITH DIFFERENTIAL
Eos, Fluid: 0 %
Lymphs, Fluid: 6 %
Monocyte-Macrophage-Serous Fluid: 18 %
Neutrophil Count, Fluid: 76 %
Other Cells, Fluid: 0 %
Total Nucleated Cell Count, Fluid: 995 cu mm

## 2021-09-21 LAB — PROTEIN, PLEURAL OR PERITONEAL FLUID: Total protein, fluid: <3 g/dL

## 2021-09-21 LAB — ALBUMIN, PLEURAL OR PERITONEAL FLUID: Albumin, Fluid: <1.5 g/dL

## 2021-09-21 LAB — LACTATE DEHYDROGENASE, PLEURAL OR PERITONEAL FLUID: LD, Fluid: 1014 U/L — ABNORMAL HIGH (ref 3–23)

## 2021-09-21 MED ORDER — ONDANSETRON HCL 4 MG/2ML IJ SOLN
4.0000 mg | Freq: Four times a day (QID) | INTRAMUSCULAR | Status: DC | PRN
  Administered 2021-09-21 – 2021-09-22 (×2): 4 mg via INTRAVENOUS
  Filled 2021-09-21 (×2): qty 2

## 2021-09-21 MED ORDER — HEPARIN SOD (PORK) LOCK FLUSH 100 UNIT/ML IV SOLN
500.0000 [IU] | Freq: Once | INTRAVENOUS | Status: AC
  Filled 2021-09-21: qty 5

## 2021-09-21 MED ORDER — APIXABAN 5 MG PO TABS
5.0000 mg | ORAL_TABLET | Freq: Two times a day (BID) | ORAL | Status: DC
  Administered 2021-09-21 – 2021-09-25 (×8): 5 mg via ORAL
  Filled 2021-09-21 (×8): qty 1

## 2021-09-21 MED ORDER — PROCHLORPERAZINE EDISYLATE 10 MG/2ML IJ SOLN
10.0000 mg | Freq: Four times a day (QID) | INTRAMUSCULAR | Status: DC | PRN
  Administered 2021-09-21 – 2021-09-22 (×2): 10 mg via INTRAVENOUS
  Filled 2021-09-21 (×3): qty 2

## 2021-09-21 MED ORDER — MORPHINE SULFATE (PF) 2 MG/ML IV SOLN
2.0000 mg | INTRAVENOUS | Status: DC | PRN
  Administered 2021-09-22: 2 mg via INTRAVENOUS
  Filled 2021-09-21: qty 1

## 2021-09-21 MED ORDER — LACTATED RINGERS IV BOLUS
2000.0000 mL | Freq: Once | INTRAVENOUS | Status: AC
  Administered 2021-09-21: 2000 mL via INTRAVENOUS

## 2021-09-21 MED ORDER — OXYCODONE HCL 5 MG PO TABS
5.0000 mg | ORAL_TABLET | ORAL | Status: DC | PRN
  Administered 2021-09-21: 5 mg via ORAL
  Filled 2021-09-21: qty 1

## 2021-09-21 MED ORDER — ENSURE ENLIVE PO LIQD
237.0000 mL | Freq: Two times a day (BID) | ORAL | Status: DC
  Administered 2021-09-22 (×2): 237 mL via ORAL

## 2021-09-21 MED ORDER — ALBUMIN HUMAN 25 % IV SOLN
12.5000 g | Freq: Four times a day (QID) | INTRAVENOUS | Status: AC
  Administered 2021-09-21 – 2021-09-22 (×3): 12.5 g via INTRAVENOUS
  Filled 2021-09-21 (×3): qty 50

## 2021-09-21 MED ORDER — SODIUM CHLORIDE 0.9 % IV SOLN: INTRAVENOUS | Status: DC

## 2021-09-21 MED ORDER — SODIUM CHLORIDE 0.9% FLUSH
10.0000 mL | INTRAVENOUS | Status: AC | PRN
  Administered 2021-09-21: 10 mL via INTRAVENOUS
  Filled 2021-09-21: qty 10

## 2021-09-21 MED ORDER — ACETAMINOPHEN 500 MG PO TABS
1000.0000 mg | ORAL_TABLET | Freq: Every day | ORAL | Status: DC
  Administered 2021-09-21 – 2021-09-23 (×3): 1000 mg via ORAL
  Filled 2021-09-21 (×4): qty 2

## 2021-09-21 MED ORDER — BISACODYL 5 MG PO TBEC: 5.0000 mg | DELAYED_RELEASE_TABLET | Freq: Every day | ORAL | Status: DC | PRN

## 2021-09-21 MED ORDER — SENNOSIDES-DOCUSATE SODIUM 8.6-50 MG PO TABS: 1.0000 | ORAL_TABLET | Freq: Every evening | ORAL | Status: DC | PRN

## 2021-09-21 MED ORDER — ACETAMINOPHEN 325 MG PO TABS: 650.0000 mg | ORAL_TABLET | Freq: Four times a day (QID) | ORAL | Status: DC | PRN

## 2021-09-21 MED ORDER — CLONAZEPAM 0.5 MG PO TABS
0.5000 mg | ORAL_TABLET | Freq: Two times a day (BID) | ORAL | Status: DC
  Administered 2021-09-21 – 2021-09-25 (×8): 0.5 mg via ORAL
  Filled 2021-09-21 (×9): qty 1

## 2021-09-21 MED ORDER — MAGNESIUM SULFATE IN D5W 1-5 GM/100ML-% IV SOLN
1.0000 g | Freq: Once | INTRAVENOUS | Status: AC
Start: 2021-09-21 — End: 2021-09-21
  Administered 2021-09-21: 1 g via INTRAVENOUS
  Filled 2021-09-21: qty 100

## 2021-09-21 MED ORDER — METOPROLOL TARTRATE 5 MG/5ML IV SOLN: 5.0000 mg | Freq: Four times a day (QID) | INTRAVENOUS | Status: DC | PRN

## 2021-09-21 MED ORDER — ALBUTEROL SULFATE (2.5 MG/3ML) 0.083% IN NEBU: 2.5000 mg | INHALATION_SOLUTION | Freq: Four times a day (QID) | RESPIRATORY_TRACT | Status: DC | PRN

## 2021-09-21 MED ORDER — OXYCODONE HCL 5 MG PO TABS: 5.0000 mg | ORAL_TABLET | ORAL | Status: DC | PRN

## 2021-09-21 MED ORDER — ACETAMINOPHEN 650 MG RE SUPP: 650.0000 mg | Freq: Four times a day (QID) | RECTAL | Status: DC | PRN

## 2021-09-21 NOTE — ED Triage Notes (Addendum)
Sent over from cancer center due to abnormal labs.  Per family, patient needs fluids and a drain placed.  Patient reports a one month history of vomiting.  During the past month patient has been hospitalized for same.

## 2021-09-21 NOTE — ED Provider Notes (Signed)
[facility] Provider Note    Event Date/Time   First MD Initiated Contact with Patient 09/21/21 1331     (approximate)   History   abnormal labs   HPI  Brittany Montoya is a 61 y.o. female with a stated past medical history of ovarian cancer currently being treated by Dr. Tasia Catchings in oncology who presents after being called by Dr. Tasia Catchings for lab abnormalities including hyponatremia, hypomagnesemia, and worsening renal function.  Patient states that she has had issues with her renal function in the past due to the medications that she is using to treat this cancer as well as general dehydration from chemotherapy-induced vomiting.  Patient states that she has had some intermittent vomiting over the last few days and does feel dehydrated     Physical Exam   Triage Vital Signs: ED Triage Vitals  Enc Vitals Group     BP 09/21/21 1032 93/62     Pulse Rate 09/21/21 1032 (!) 107     Resp 09/21/21 1032 16     Temp 09/21/21 1032 98 F (36.7 C)     Temp Source 09/21/21 1032 Oral     SpO2 09/21/21 1032 98 %     Weight 09/21/21 1031 179 lb 14.3 oz (81.6 kg)     Height 09/21/21 1031 5\' 1"  (1.549 m)     Head Circumference --      Peak Flow --      Pain Score 09/21/21 1031 0     Pain Loc --      Pain Edu? --      Excl. in [place]? --     Most recent vital signs: Vitals:   09/21/21 1342 09/21/21 1510  BP: (!) 101/58 99/64  Pulse: (!) 101 91  Resp: 17   Temp: 98.9 F (37.2 C)   SpO2: 100% 100%    General: Awake, no distress.  CV:  Good peripheral perfusion.  Resp:  Normal effort.  Abd:  No distention.  Other:  Chronically ill-appearing elderly Caucasian female   ED Results / Procedures / Treatments   Labs (all labs ordered are listed, but only abnormal results are displayed) Labs Reviewed  COMPREHENSIVE METABOLIC PANEL - Abnormal; Notable for the following components:      Result Value   Sodium 127 (*)    Chloride 90 (*)    BUN 43 (*)    Creatinine,  Ser 3.30 (*)    Total Protein 5.8 (*)    Albumin 2.4 (*)    Alkaline Phosphatase 127 (*)    GFR, Estimated 15 (*)    All other components within normal limits  CBC WITH DIFFERENTIAL/PLATELET - Abnormal; Notable for the following components:   WBC 26.4 (*)    RBC 2.72 (*)    Hemoglobin 8.8 (*)    HCT 26.4 (*)    RDW 18.7 (*)    Neutro Abs 24.2 (*)    Abs Immature Granulocytes 0.23 (*)    All other components within normal limits  MAGNESIUM - Abnormal; Notable for the following components:   Magnesium 1.5 (*)    All other components within normal limits  PHOSPHORUS - Abnormal; Notable for the following components:   Phosphorus 5.6 (*)    All other components within normal limits  RESP PANEL BY RT-PCR (FLU A&B, COVID) ARPGX2  LACTATE DEHYDROGENASE, PLEURAL OR PERITONEAL FLUID  BODY FLUID CELL COUNT WITH DIFFERENTIAL  AMYLASE, PLEURAL OR PERITONEAL FLUID  ALBUMIN, PLEURAL OR PERITONEAL FLUID   PROTEIN, PLEURAL OR PERITONEAL FLUID  GLUCOSE, PLEURAL OR PERITONEAL FLUID  MISC [facility] TEST (SEND OUT)  LIPASE, FLUID  CYTOLOGY - NON PAP  TROPONIN I (HIGH SENSITIVITY)  TROPONIN I (HIGH SENSITIVITY)    RADIOLOGY  ED MD interpretation: 2 view chest x-ray shows no evidence of acute abnormalities including no pneumonia, pneumothorax, or widened mediastinum  Official radiology report(s): DG Chest 2 View  Result Date: 09/21/2021 CLINICAL DATA:  Vomiting and weakness.  History of ovarian cancer. EXAM: CHEST - 2 VIEW COMPARISON:  Chest x-ray dated June 01, 2021. FINDINGS: Unchanged right chest wall port catheter. The heart size and mediastinal contours are within normal limits. Normal pulmonary vascularity. No focal consolidation, pleural effusion, or pneumothorax. No acute osseous abnormality. IMPRESSION: 1. No acute cardiopulmonary disease. Electronically Signed   By: Titus Dubin M.D.   On: 09/21/2021 11:10      PROCEDURES:  Critical Care performed: No  .1-3  Lead EKG Interpretation Performed by: Naaman Plummer, MD Authorized by: Naaman Plummer, MD     Interpretation: normal     ECG rate:  90   ECG rate assessment: normal     Rhythm: sinus rhythm     Ectopy: none     Conduction: normal     MEDICATIONS ORDERED IN ED: Medications  lactated ringers bolus 2,000 mL (2,000 mLs Intravenous New Bag/Given 09/21/21 1407)     IMPRESSION / MDM / ASSESSMENT AND PLAN / ED COURSE  I reviewed the triage vital signs and the nursing notes.                              Differential diagnosis includes, but is not limited to, arrhythmia, dehydration, tumor lysis syndrome  The patient is on the cardiac monitor to evaluate for evidence of arrhythmia and/or significant heart rate changes.  Patient is a 61 year old female with a past medical history of ovarian cancer currently under treatment by oncology who presents after being called by her oncologist for abnormal lab values including hyponatremia and concerns for worsening renal function.  Patient states that she just feels dehydrated and therefore fluids began with 2 L of LR empirically.  Patient is also requesting a paracentesis as she was scheduled with the ultrasound department today for fluid removal.  I spoke to the ultrasound department and they are still willing to accommodate Brittany Montoya prior to her admission.  I spoke with the on-call hospitalist service who agreed to accept this patient onto the inpatient service for further evaluation and management.  I also discussed the possibility of patient needing albumin supplementation depending on the amount of fluid that is removed from the of this paracentesis.  I spoke to family who also agrees with plan and all questions were answered to the best my ability.  Dispo: Admit to medicine      FINAL CLINICAL IMPRESSION(S) / ED DIAGNOSES   Final diagnoses:  Ascitic fluid  Acute renal failure superimposed on chronic kidney disease, unspecified CKD  stage, unspecified acute renal failure type ([place])  Hyponatremia     Rx / DC Orders   ED Discharge Orders     None        Note:  This document was prepared using Dragon voice recognition software and may include unintentional dictation errors.   Naaman Plummer, MD 09/21/21 [phone]

## 2021-09-21 NOTE — H&P (Signed)
History and Physical    Brittany Montoya OQH:[id number] DOB: 1961/04/05 DOA: 09/21/2021  PCP: Adin Hector, MD  Patient coming from: Oncologist's office, Dr. Earlie Server  I have personally briefly reviewed patient's old medical records in [facility].  Chief Complaint: abnormal labs  HPI: Brittany Montoya is a 61 y.o. female with medical history significant for ovarian cancer, malignant ascites (receives periodic paracentesis), obstructive sleep apnea (does not use CPAP), GERD, pulmonary embolism 04/12/2021 now on Eliquis, chronic kidney disease stage 3 (baseline Creatinine 1.5), anemia, arthritis, who presents from her oncologist office to the emergency department on 09/21/2021 with abnormal labs, vomiting, and abdominal pain. Vomiting began on 08/30/21, happens multiple times per day, is green, and has been more frequent. Patient's abdominal pain began months ago but has worsened in the last few weeks; duration is intermittent but frequent. Pain was located in the lower abdomen initially but lately is also in the upper abdomen, does not radiate, is up to 10/10 at times and is characterized as crampy. Symptoms are alleviated by nothing and exacerbated by nothing. Associated symptoms: Minimal oral intake of liquids and solids. Nausea, vomiting, loose stool. Stool is green. No bloody stool. Leg swelling but no known weight gain. No fever or chills. Shortness of breath prior to paracentesis on admission but resolved after paracentesis. No coughing or wheezing. No chest pain or palpitations. No dysuria or hematuria. Has managed pain with tramadol until a few weeks ago, then hand to increase to oxycodone. It was thought previously that she could have a bacterial infection in the abdomen and she was placed on antibiotics, but cultures returned negative and antibiotics did not change her pain or abdominal symptoms, so they were completed and not restarted.  Patient was sent from her oncologist  office due to abnormal labs including low sodium and increased creatinine.  Patient's daughter at bedside reports that they were considering a permanent drainage catheter for the patient's ascites.  Pulmonary embolism was diagnosed 04/13/2019 and she has been on Eliquis since then; her last dose prior to admission was in the evening on 09/20/20; she does not stop the Eliquis for her scheduled visits for paracentesis.   Echocardiogram is scheduled for Monday, 09/24/21, to evaluate for an cardiac component to her volume overload.  ED Course: Temperature 98.9 F, pulse 91, BP 101/58, O2 sat 100%.  Labs include creatinine 3.3 (previously 1.5), GFR 15, sodium 124 with repeat 127, BUN 43, phosphorus 5.6, magnesium 1.5, alk phos 127, albumin 2.4, total bili 0.9, troponin high-sensitivity 17, WBCs 26.4 (WBCs 25-39.5 from 09/01/21 to present), hemoglobin 8.8 (9.3 on 09/14/21), platelets 391.  SARS-CoV-2 PCR, influenza A and B are all negative.  Patient had paracentesis, which had been previously scheduled.  She had 5L pulled off during paracentesis (usually has 4-5 L taken off).  Chest x-ray showed no acute process.   ___________  Review of Systems: As per HPI otherwise all other systems reviewed and are unremarable. NEUROLOGICAL: No headache or new focal weakness.    Past Medical History:  Diagnosis Date   Anxiety    Arthritis    Ascites    Cancer associated pain    Cirrhosis of liver (HCC)    DDD (degenerative disc disease), cervical    Depression    Family history of breast cancer    GERD (gastroesophageal reflux disease)    Glaucoma    Headache    migraines   Hypertension    Ovarian cancer ([place]) 04/15/2021  PONV (postoperative nausea and vomiting)    Pulmonary embolism ([place]) 04/12/2021   04/12/21 CTA: multiple bilateral lobar, segmental, and subsegmental PEs. Treated with Eliquis.   Sleep apnea    Venous stasis     Past Surgical History:  Procedure Laterality Date   ABDOMINAL  HYSTERECTOMY N/A 2008   may have had left ovary removed   [place]     2012, 2015, 2020   COLONOSCOPY N/A 06/29/2021   Procedure: COLONOSCOPY;  Surgeon: Toledo, Benay Pike, MD;  Location: [facility] END[facility]COPY;  Service: Gastroenterology;  Laterality: N/A;  DOCTORS ARE IN AGREEMENT THAT TOLEDO CAN DO THIS PROCEDURE IN RUSSO'S BLOCK   ESOPHAGOGASTRODUODENOSCOPY N/A 06/29/2021   Procedure: ESOPHAGOGASTRODUODENOSCOPY (EGD);  Surgeon: Toledo, Benay Pike, MD;  Location: [facility] ENDOSCOPY;  Service: Gastroenterology;  Laterality: N/A;   PARACENTESIS  04/20/2021   PILONIDAL CYST / SINUS EXCISION     PORTACATH PLACEMENT Right 06/01/2021   Procedure: INSERTION PORT-A-CATH;  Surgeon: Herbert Pun, MD;  Location: [facility] ORS;  Service: General;  Laterality: Right;    Social History  reports that she has never smoked. She has never used smokeless tobacco. She reports that she does not drink alcohol and does not use drugs.  Allergies  Allergen Reactions   Paclitaxel Shortness Of Breath and Other (See Comments)    pt flush, stated she didn't feel well, taxol stopped (05/10/2021)   Amlodipine Other (See Comments) and Rash    Not effective Not effective    Penicillins Rash    Family History  Problem Relation Age of Onset   Breast cancer Mother 98   Breast cancer Maternal Aunt        d. under 53     Home Medications  Prior to Admission medications   Medication Sig Start Date End Date Taking? Authorizing Provider  acetaminophen (TYLENOL) 500 MG tablet Take 1,000 mg by mouth at bedtime.    [provider]  albuterol (VENTOLIN HFA) 108 (90 Base) MCG/ACT inhaler Inhale 1-2 puffs into the lungs every 6 (six) hours as needed for wheezing or shortness of breath. 03/03/21   [provider]  clonazePAM (KLONOPIN) 0.5 MG tablet Take 1 tablet (0.5 mg total) by mouth 2 (two) times daily. 09/07/21   Earlie Server, MD  DULoxetine  (CYMBALTA) 30 MG capsule Take 1 capsule (30 mg total) by mouth daily. Patient not taking: Reported on 09/14/2021 08/24/21   Earlie Server, MD  ELIQUIS 5 MG TABS tablet Take 1 tablet (5 mg total) by mouth 2 (two) times daily. 09/19/21   Earlie Server, MD  feeding supplement (ENSURE ENLIVE / ENSURE PLUS) LIQD Take 237 mLs by mouth 2 (two) times daily between meals. 09/08/21   Loletha Grayer, MD  hydrocortisone butyrate (LUCOID) 0.1 % CREA cream Apply 1 application topically daily. 08/03/21   Borders, Kirt Boys, NP  lidocaine-prilocaine (EMLA) cream Apply to affected area once Patient taking differently: Apply 1 application topically daily as needed (pain). Apply to affected area once 04/15/21   Earlie Server, MD  Multiple Vitamin (MULTIVITAMIN WITH MINERALS) TABS tablet Take 1 tablet by mouth daily. Patient not taking: Reported on 09/14/2021    [provider]  nystatin (MYCOSTATIN) 100000 UNIT/ML suspension Use as directed 5 mLs (500,000 Units total) in the mouth or throat 4 (four) times daily. 08/03/21   Borders, Kirt Boys, NP  ondansetron (ZOFRAN) 8 MG tablet Take 1 tablet (8 mg total) by mouth  2 (two) times daily as needed for refractory nausea / vomiting. Start on day 3 after carboplatin chemo. Patient not taking: Reported on 09/14/2021 04/15/21   Earlie Server, MD  oxyCODONE (OXY IR/ROXICODONE) 5 MG immediate release tablet Take 1 tablet (5 mg total) by mouth every 6 (six) hours as needed for severe pain. 09/14/21   Verlon Au, NP  potassium chloride SA (KLOR-CON) 20 MEQ tablet Take 1 tablet (20 mEq total) by mouth 2 (two) times daily. Patient not taking: Reported on 09/14/2021 07/09/21   Earlie Server, MD  prochlorperazine (COMPAZINE) 10 MG tablet Take 1 tablet (10 mg total) by mouth every 6 (six) hours as needed (Nausea or vomiting). 09/19/21   Earlie Server, MD  promethazine (PHENERGAN) 25 MG suppository Place 1 suppository (25 mg total) rectally every 6 (six) hours as needed for nausea or vomiting. Patient not  taking: Reported on 09/14/2021 09/07/21   Earlie Server, MD     ___________   Physical Exam: Vitals:   09/21/21 1031 09/21/21 1032 09/21/21 1342 09/21/21 1510  BP:  93/62 (!) 101/58 99/64  Pulse:  (!) 107 (!) 101 91  Resp:  16 17   Temp:  98 F (36.7 C) 98.9 F (37.2 C)   TempSrc:  Oral Oral   SpO2:  98% 100% 100%  Weight: 81.6 kg     Height: _0  (1.549 m)       Constitutional: Ill-appearing, well developed. obese. Vitals:   09/21/21 1031 09/21/21 1032 09/21/21 1342 09/21/21 1510  BP:  93/62 (!) 101/58 99/64  Pulse:  (!) 107 (!) 101 91  Resp:  16 17   Temp:  98 F (36.7 C) 98.9 F (37.2 C)   TempSrc:  Oral Oral   SpO2:  98% 100% 100%  Weight: 81.6 kg     Height: _1  (1.549 m)      Eyes: Pupils equal and round, lids and conjunctivae without icterus or erythema. ENMT: Mucous membranes are dry. Posterior pharynx clear of any exudate or lesions. Nares patent without discharge or bleeding.  Normocephalic, atraumatic.  Normal dentition.  Neck: normal, supple, no masses, trachea midline.  Thyroid nontender, no masses appreciated, no thyromegaly. Respiratory: clear to auscultation bilaterally. Chest wall movements are symmetric. No wheezing, no crackles.  No rhonchi.  Normal respiratory effort. No accessory muscle use.  Cardiovascular: Normal S1, S2. No rubs or gallops. Pulses: Radial, PT, and DP pulses 2+ bilaterally. No carotid bruits.  Capillary refill less than 3 seconds. Edema: 2+ bilaterally. GI: soft, distended, hypoactive bowel sounds. No hepatosplenomegaly. No rigidity, rebound, or guarding. No CVA tenderness bilaterally. No masses palpated. Generalized tenderness, worse in the left lower quadrant. Musculoskeletal: no clubbing / cyanosis. No joint deformity upper and lower extremities. Good ROM, no contractures. Normal muscle tone.  No tenderness or deformity in the back bilaterally. Integument: no rashes, lesions, ulcers. No induration. Clean, dry, intact.  Pale. Neurologic: CN 2-12 grossly intact. Sensation grossly intact to light touch. DTR 2+ bilaterally.  Babinski: Toes downgoing bilaterally.  Strength 5/5 in upper extremities and 3-/5 in lower extremities.  Intact rapid alternating movements bilaterally.  No pronator drift. Psychiatric: Normal judgment and insight. Alert and oriented x 3.   Flat affect. Lymphatic: No cervical lymphadenopathy. No supraclavicular lymphadenopathy.   Labs on Admission: I have personally reviewed the following labs and imaging studies.  CBC: Recent Labs  Lab 09/21/21 0849 09/21/21 1400  WBC 28.2* 26.4*  NEUTROABS 26.0* 24.2*  HGB 8.9* 8.8*  HCT 26.3*  26.4*  MCV 96.3 97.1  PLT 407* 496    Basic Metabolic Panel: Recent Labs  Lab 09/21/21 0849 09/21/21 1400  NA 124* 127*  K 3.8 4.0  CL 86* 90*  CO2 22 24  GLUCOSE 114* 91  BUN 43* 43*  CREATININE 3.36* 3.30*  CALCIUM 9.7 9.7  MG  --  1.5*  PHOS  --  5.6*    GFR: Estimated Creatinine Clearance: 17.3 mL/min (A) (by C-G formula based on SCr of 3.3 mg/dL (H)).  Liver Function Tests: Recent Labs  Lab 09/21/21 0849 09/21/21 1400  AST 37 39  ALT 15 16  ALKPHOS 136* 127*  BILITOT 0.6 0.9  PROT 6.2* 5.8*  ALBUMIN 2.4* 2.4*    Urine analysis: Ordered.   Radiological Exams on Admission:  Imaging viewed and independently reviewed/interpreted personally.  DG Chest 2 View  Result Date: 09/21/2021 CLINICAL DATA:  Vomiting and weakness.  History of ovarian cancer. EXAM: CHEST - 2 VIEW COMPARISON:  Chest x-ray dated June 01, 2021. FINDINGS: Unchanged right chest wall port catheter. The heart size and mediastinal contours are within normal limits. Normal pulmonary vascularity. No focal consolidation, pleural effusion, or pneumothorax. No acute osseous abnormality. IMPRESSION: 1. No acute cardiopulmonary disease. Electronically Signed   By: Titus Dubin M.D.   On: 09/21/2021 11:10    CXR: No acute process.  EKG: Independently  reviewed.  Most recent EKG, 09/07/21: 102 bpm.  Sinus tachycardia.  PACs and PVCs.  Flat T wave in lead III.  QRS complex amplitude markedly decreased in V3 through V6.  QTc 435. ___________   Assessment/Plan  Principal Problem:    Acute renal failure superimposed on stage 3a chronic kidney disease ([place]) Most likely related to dehydration from severe vomiting and poor oral intake. Plan: Avoid nephrotoxins.  Renally dose medications.  Liquid diet but also renal diet.  Check urine labs.  Ordered renal ultrasound.  Trial of IV fluids. If no improvement in the a.m. then consider nephrology consult.    Active Problems:    Ovarian cancer [facility]) Has been on chemotherapy most recently in early December 2022.  Followed by oncologist, Dr. Tasia Catchings.  Ovarian cancer is causing her recurrent malignant ascites. Plan: Treat malignant ascites.  Palliative care consult requested by oncologist.    Hypercoagulable state [facility]) Likely related to her malignancy.  Had PE on 04/12/2021. Plan: Continue home Eliquis twice a day.  Monitor for any bleeding.    Hypercalcemia Admission calcium is 9.7 with corrected calcium of 11.  Likely due to dehydration. Plan: Trial of IV fluids.  Recheck calcium in the AM.    Malignant ascites Recurrent malignant ascites.  Has been checked over the last month for infection but has been ruled out.  Receives paracentesis every week. Plan: Paracentesis completed on 09/21/2021 on admission. Give post paracentesis albumin IV. Consider placement of permanent drainage catheter due to recurrence of ascites. Unfortunately ascites may worsen as patient receives IV fluids for her acute renal failure. IV morphine as needed for pain.    Hyponatremia Mild.  Likely related to dehydration and poor oral intake. Plan: Check labs.  Give IV fluids.    Anemia Likely related to chronic disease from uterine cancer and chronic use of Eliquis. Plan: Recheck CBC.  Monitor for any bleeding.     Nausea & vomiting Severe.  Patient unable to keep anything down. She has taken oral Reglan at home and also other oral medications including oral Compazine and Zofran. Plan: IV Zofran as first-line  medication and IV Compazine as second line. Will not continue the Reglan because it can interact with the other antiemetics.  Patient was also informed of the risk of tardive dyskinesia with Reglan. Patient is agreeable to the plan for IV antiemetics.    Hypoalbuminemia due to protein-calorie malnutrition ([place]) Plan: IV fluids initially.  Once patient is able to take p.o., then could consider nutrition consult, but upon admission her nausea and vomiting is too severe to warrant a nutrition consult.  Give IV albumin.    Hypomagnesemia Plan: Replace IV and recheck in the AM.  Caution due to renal failure.  Consider adding oral magnesium depending on magnesium recheck results.    Hyperphosphatemia Likely related to acute renal failure. Plan: Renal diet when patient is able to take p.o.  Would not give PhosLo because it is calcium based, and patient's calcium level is also elevated.    Abdominal pain, generalized Plan: IV morphine as needed.   ___________  MDM Reviewed: previous chart and vitals Reviewed previous: labs and ECG Interpretation: labs and x-ray    MEDICAL DECISION MAKING  Number and Complexity of Problems Addressed:  High Acute or chronic illness that poses a threat to life or bodily function   Amount and Complexity of Data to be Reviewed and Analyzed: High/Extensive Category 1: Tests, documents, or independent historians: Review results of each unique test: Reviewed labs including WBC, Hemoglobin, platelets, sodium, glucose, creatinine, LFTs, and others. Review of prior external notes from each unique source including emergency department note and oncology office visit note. Ordering of each unique test: CBC, metabolic panel. Obtained independent history from source  other than the patient: From daughter at bedside.  Category 2: Independent interpretation of tests: Independent interpretation of previous EKG; see H&P note for interpretation. Independent interpretation of imaging, CXR; see H&P note for interpretation.  Category 3: Discussion of management or test interpretation with physician or other qualified healthcare professional: Discussed case with emergency department provider  Risk of Complications and Morbidity or Mortality of Patient Management High Decision to admit patient. Parenteral controlled substances: IV morphine.   Total time: 90 minutes.  ___________   DVT prophylaxis: Eliquis.  Code Status:   Full Code Discussed code status options at length with the patient, who elects to continue to be Full code.    Family Communication:  None    Disposition Plan:   Patient is from:  Home  Anticipated DC to:  Home  Anticipated DC date:  09/22/20  Anticipated DC barriers: Continued pain, continued renal failure, continued electrolyte abnormalities.  Consults called:  None  Admission status:  Observation   Severity of Illness: The appropriate patient status for this patient is OBSERVATION. Observation status is judged to be reasonable and necessary in order to provide the required intensity of service to ensure the patient's safety. The patient's presenting symptoms, physical exam findings, and initial radiographic and laboratory data in the context of their medical condition is felt to place them at decreased risk for further clinical deterioration. Furthermore, it is anticipated that the patient will be medically stable for discharge from the hospital within 2 midnights of admission.     Tacey Ruiz MD [facility]  How to contact the [facility] Attending or Consulting provider [place] or covering provider during after hours [place], for this patient?   Check the care team in [facility] and look for a) attending/consulting [facility] provider  listed and b) the [facility] team listed Log into [email or web address] and use [facility]'s universal password to access. If you do not have the password, please contact the hospital operator. Locate the [facility] provider you are looking for under [facility] and page to a number that you can be directly reached. If you still have difficulty reaching the provider, please page the [facility] (Director on Call) for the Hospitalists listed on amion for assistance.  09/21/2021, 3:35 PM

## 2021-09-21 NOTE — Procedures (Signed)
PROCEDURE SUMMARY:  Successful US guided paracentesis from right abdomen.  Yielded 5 L of amber-colored fluid.  No immediate complications.  Pt tolerated well.   Specimen sent for labs.  EBL < 2 mL  Theresa Duty, NP 09/21/2021 4:02 PM

## 2021-09-21 NOTE — Progress Notes (Signed)
Patient here for follow up. Pt reports she is not able to hold anything in. Pts mobility is limited due to weakness and back is starting to hurt. Pt scheduled for paracentesis this afternoon.

## 2021-09-21 NOTE — Evaluation (Signed)
Physical Therapy Evaluation Patient Details Name: Brittany Montoya MRN: [id number] DOB: 05-04-1961 Today's Date: 09/21/2021  History of Present Illness  Pt is a 61 y.o. female with medical history significant for  ovarian cancer on chemotherapy, liver cirrhosis with ascites, hypertension, GERD, depression with anxiety, thrombocytopenia, and PE on Eliquis who presented to the ED after being called by Dr. Tasia Catchings for lab abnormalities including hyponatremia, hypomagnesemia, and worsening renal function. Pt s/p paracentesis of R abdomen that yielded 5L of fluid.   Clinical Impression  Pt was pleasant and motivated to participate during the session and put forth good effort throughout. Pt required min physical assistance with bed mobility tasks and close CGA with transfers and gait. Pt was only able to amb a max of 6' before fatiguing and needing to return to sitting.  Pt's SpO2 and HR were both WNL throughout the session on room air with no adverse symptoms noted other than nausea that was present at onset of session, nursing aware. Pt is functionally very weak and is at an elevated risk for falls at this time.  Pt will benefit from PT services in a SNF setting upon discharge to safely address deficits listed in patient problem list for decreased caregiver assistance and eventual return to PLOF.         Recommendations for follow up therapy are one component of a multi-disciplinary discharge planning process, led by the attending physician.  Recommendations may be updated based on patient status, additional functional criteria and insurance authorization.  Follow Up Recommendations Skilled nursing-short term rehab (<3 hours/day)    Assistance Recommended at Discharge Frequent or constant Supervision/Assistance  Patient can return home with the following  A lot of help with walking and/or transfers;Two people to help with bathing/dressing/bathroom;Help with stairs or ramp for entrance;Assist for  transportation;Assistance with cooking/housework    Equipment Recommendations None recommended by PT  Recommendations for Other Services       Functional Status Assessment Patient has had a recent decline in their functional status and demonstrates the ability to make significant improvements in function in a reasonable and predictable amount of time.     Precautions / Restrictions Precautions Precautions: None Restrictions Weight Bearing Restrictions: No      Mobility  Bed Mobility Overal bed mobility: Needs Assistance Bed Mobility: Supine to Sit;Sit to Supine     Supine to sit: Supervision Sit to supine: Min assist   General bed mobility comments: Min A for BLE control during sit to sup; significantly increased effort needed with sup to sit but no physical assist    Transfers Overall transfer level: Needs assistance Equipment used: Rolling walker (2 wheels) Transfers: Sit to/from Stand Sit to Stand: Min guard;From elevated surface           General transfer comment: Pt able to stand from significantly elevated ER bed without physical assist with fair eccentric and concentric control    Ambulation/Gait Ambulation/Gait assistance: Min guard Gait Distance (Feet): 6 Feet Assistive device: Rolling walker (2 wheels) Gait Pattern/deviations: Step-through pattern;Decreased step length - right;Decreased step length - left;Trunk flexed Gait velocity: decreased     General Gait Details: Pt able to amb a max of 6 feet before fatiguing and needing to return to sitting; pt ambulated with very slow cadence and short B step length but was generally steady with the RW with no LOB; SpO2 and HR WNL on room air  Stairs            Wheelchair Mobility  Modified Rankin (Stroke Patients Only)       Balance Overall balance assessment: Needs assistance;History of Falls Sitting-balance support: Bilateral upper extremity supported;Feet unsupported Sitting balance-Leahy  Scale: Good     Standing balance support: Bilateral upper extremity supported;During functional activity Standing balance-Leahy Scale: Fair                               Pertinent Vitals/Pain Pain Assessment: No/denies pain    Home Living Family/patient expects to be discharged to:: Private residence Living Arrangements: Spouse/significant other Available Help at Discharge: Family;Available 24 hours/day Type of Home: House Home Access: Stairs to enter Entrance Stairs-Rails: Right;Left     Home Layout: One level Home Equipment: Conservation officer, nature (2 wheels);Cane - single point;BSC/3in1;Wheelchair - manual;Shower seat - built in      Prior Function Prior Level of Function : Independent/Modified Independent             Mobility Comments: Mod Ind amb with a RW with 1 fall in the last 6 months secondary to LE weakness       Hand Dominance        Extremity/Trunk Assessment   Upper Extremity Assessment Upper Extremity Assessment: Generalized weakness    Lower Extremity Assessment Lower Extremity Assessment: Generalized weakness       Communication   Communication: No difficulties  Cognition Arousal/Alertness: Awake/alert Behavior During Therapy: WFL for tasks assessed/performed Overall Cognitive Status: Within Functional Limits for tasks assessed                                          General Comments      Exercises Total Joint Exercises Ankle Circles/Pumps: AROM;Strengthening;Both;10 reps Quad Sets: Strengthening;Both;10 reps Gluteal Sets: Strengthening;Both;10 reps Marching in Standing: AROM;Strengthening;Both;5 reps;Standing Other Exercises Other Exercises: HEP education and review for BLE APs, QS, GS, and LAQs x 10 each every 1-2 hours daily and BLE hip abd/add, SLR, and heels slides as able 2x/day   Assessment/Plan    PT Assessment Patient needs continued PT services  PT Problem List Decreased strength;Decreased  activity tolerance;Decreased balance;Decreased mobility;Decreased knowledge of use of DME       PT Treatment Interventions DME instruction;Gait training;Stair training;Functional mobility training;Therapeutic activities;Therapeutic exercise;Balance training;Patient/family education    PT Goals (Current goals can be found in the Care Plan section)  Acute Rehab PT Goals Patient Stated Goal: To get stronger PT Goal Formulation: With patient Time For Goal Achievement: 10/04/21 Potential to Achieve Goals: Good    Frequency Min 2X/week     Co-evaluation               AM-PAC PT "6 Clicks" Mobility  Outcome Measure Help needed turning from your back to your side while in a flat bed without using bedrails?: A Little Help needed moving from lying on your back to sitting on the side of a flat bed without using bedrails?: A Little Help needed moving to and from a bed to a chair (including a wheelchair)?: A Little Help needed standing up from a chair using your arms (e.g., wheelchair or bedside chair)?: A Little Help needed to walk in hospital room?: A Lot Help needed climbing 3-5 steps with a railing? : Total 6 Click Score: 15    End of Session   Activity Tolerance: Patient tolerated treatment well Patient left: in bed;with family/visitor present;Other (  comment) (Bilateral rails up as found in the ED) Nurse Communication: Mobility status PT Visit Diagnosis: History of falling (Z91.81);Unsteadiness on feet (R26.81);Difficulty in walking, not elsewhere classified (R26.2);Muscle weakness (generalized) (M62.81)    Time: 1483-0735 PT Time Calculation (min) (ACUTE ONLY): 35 min   Charges:   PT Evaluation $PT Eval Moderate Complexity: 1 Mod PT Treatments $Therapeutic Exercise: 8-22 mins       D. Royetta Asal PT, DPT 09/21/21, 5:40 PM

## 2021-09-21 NOTE — Progress Notes (Signed)
Hematology/Oncology progress note    Patient Care Team: Adin Hector, MD as PCP - General (Internal Medicine) Earlie Server, MD as Consulting Physician (Hematology and Oncology) Clent Jacks, RN as Oncology Nurse Navigator  REFERRING PROVIDER: Adin Hector, MD  CHIEF COMPLAINTS/REASON FOR VISIT:  Malignant ascites, ovarian neoplasm, pulmonary embolism  HISTORY OF PRESENTING ILLNESS:   Brittany Montoya is a  61 y.o.  female with PMH listed below was seen in consultation at the request of  Adin Hector, MD  for evaluation of complicated ovarian cyst, ascites  Patient has noticed generalized abdominal distention and bloating, nausea and constipation for the past months and  03/23/2021, CT abdomen pelvis with contrast showed large solid and cystic right ovarian mass measuring 14.1 x 18.1 x 16 cm highly suspicious for primary ovarian malignancy.  Evidence of peritoneal spread.  Moderate associated ascites. Slightly small liver with mild nodular contour suggesting a degree of cirrhosis.  04/03/2021, CA125 70.9, Hg 4 400 09.  04/04/2021, was seen by gynecology Dr. Ouida Sills.  And was referred to establish care with GYN oncology. 04/09/2021, patient underwent paracentesis and had 6.3 L of hazy yellow fluid removed.  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 Theora Gianotti and me.  Case was discussed on Gynonc tumor board on 04/25/2021. Consensus reached upon clinical diagnosis of locally advanced Stage II/III Ovarian Cancer, plan neoadjuvant chemotherapy followed by debulking surgery.   Patient also has noticed bilateral lower extremity edema, progressively worsening.  Fatigued, shortness of breath with exertion.  Denies any chest pain, cough.  Poor oral intake due to decreased appetite She was accompanied by her husband. Denies any alcohol use or previous hepatitis infection.  She is not aware about cirrhosis. She reports some  symptom relief after the paracentesis.  Her abdomen seems to got better and getting worse again.   8/129/2022 [facility] Cdx  Myriad HRD negative BRAC analysis CDX negative for BRAC 1 and BRAC 2  04/19/21- carbo AUC 5- taxol 135 mg/m2 05/10/21- carbo AUC 6 - taxol 150 mg/m2; reaction to taxol- discontinued 06/07/21- carbo AUC 6 - abraxane 260 mg/m2  # CA125/CEA ratio is <25,  06/29/2021 colonoscopy showed non bleeding internal hemorrhoids. Diverticulosis,  Distal transverse colon 4 mm polyp, resected and retrieved.-Pathology showed polypoid colonic mucosa with intramucosal lymphoid aggregate.  Negative for malignancy and dysplasia. Upper endoscopy showed esophageal mucosal changes suspicious for eosinophilic esophagitis.  Biopsied-pathology showed reflux esophagitis.  Negative for increased eosinophils..  Benign-appearing esophageal stenosis.  Hiatal hernia. 06/01/2021, patient has US abdomen paracentesis and had 4 L of fluid removed.  #06/25/2021 1. Redemonstrated large, mixed solid and cystic mass of the right ovary, which is slightly diminished in size. 2. Peritoneal and omental thickening and nodularity is improved compared to prior examination, although still present. 3. Findings are consistent with treatment response of primary ovarian malignancy and peritoneal and omental metastatic disease. 4. Moderate volume ascites throughout the abdomen and pelvis, similar in volume to prior. 5. No evidence of metastatic disease in the chest.  06/26/2021 therapeutic paracentesis.    06/27/2021 seen by Dr.Secord. patient is deconditioned. Dr.Secord recommend to continue neoadjuvant chemotherapy and optimize her health status with physical therapy and dietitian.   07/23/2021, ultrasound guided paracentesis, 4.1 L removed.  09/05/2021 Liver biopsy at [facility] showed mild Kupffer cells and hepatocellular iron accumulation, 1+ of 4. No significant inflammation.  INTERVAL HISTORY Brittany Montoya is a  61  female who has above history reviewed by me today presents for follow up visit for nausea vomiting, abdominal pain, history of malignant ascites, ovarian neoplasm, pulmonary embolism Patient was accompanied by her spouse. Patient has had a multiple admission recently.   Recurrent malignant ascites, weekly paracentesis. Patient reports abdominal distention, not able to keep anything down, nausea vomiting.  Feeling weak. Denies any fever or chills.  Abdominal pain is the same.   Review of Systems  Constitutional:  Positive for appetite change and fatigue. Negative for chills and fever.  HENT:   Negative for hearing loss and voice change.   Eyes:  Negative for eye problems.  Respiratory:  Positive for shortness of breath. Negative for chest tightness and cough.   Cardiovascular:  Negative for chest pain.  Gastrointestinal:  Positive for abdominal distention, abdominal pain, constipation, nausea and vomiting. Negative for blood in stool.  Endocrine: Negative for hot flashes.  Genitourinary:  Negative for difficulty urinating and frequency.   Musculoskeletal:  Negative for arthralgias.  Skin:  Negative for itching and rash.  Neurological:  Negative for extremity weakness.  Hematological:  Negative for adenopathy.  Psychiatric/Behavioral:  Negative for confusion. The patient is not nervous/anxious.    MEDICAL HISTORY:  Past Medical History:  Diagnosis Date   Anxiety    Arthritis    Ascites    Cancer associated pain    Cirrhosis of liver ([place])    DDD (degenerative disc disease), cervical    Depression    Family history of breast cancer    GERD (gastroesophageal reflux disease)    Glaucoma    Headache    migraines   Hypertension    Ovarian cancer ([place]) 04/15/2021   PONV (postoperative nausea and vomiting)    Pulmonary embolism ([place])    Sleep apnea    Venous stasis     SURGICAL HISTORY: Past Surgical History:  Procedure Laterality Date   ABDOMINAL HYSTERECTOMY N/A 2008    may have had left ovary removed   [place]     2012, 2015, 2020   COLONOSCOPY N/A 06/29/2021   Procedure: COLONOSCOPY;  Surgeon: Toledo, Benay Pike, MD;  Location: [facility] ENDOSCOPY;  Service: Gastroenterology;  Laterality: N/A;  DOCTORS ARE IN AGREEMENT THAT TOLEDO CAN DO THIS PROCEDURE IN RUSSO'S BLOCK   ESOPHAGOGASTRODUODENOSCOPY N/A 06/29/2021   Procedure: ESOPHAGOGASTRODUODENOSCOPY (EGD);  Surgeon: Toledo, Benay Pike, MD;  Location: [facility] ENDOSCOPY;  Service: Gastroenterology;  Laterality: N/A;   PARACENTESIS  04/20/2021   PILONIDAL CYST / SINUS EXCISION     PORTACATH PLACEMENT Right 06/01/2021   Procedure: INSERTION PORT-A-CATH;  Surgeon: Herbert Pun, MD;  Location: [facility] ORS;  Service: General;  Laterality: Right;    SOCIAL HISTORY: Social History   Socioeconomic History   Marital status: Married    Spouse name: Barnabas Lister   Number of children: 2   Years of education: Not on file   Highest education level: Not on file  Occupational History   Not on file  Tobacco Use   Smoking status: Never   Smokeless tobacco: Never  Vaping Use   Vaping Use: Never used  Substance and Sexual Activity   Alcohol use: No   Drug use: No   Sexual activity: Not Currently    Birth control/protection: Surgical  Other Topics Concern   Not on file  Social History Narrative   Not on file   Social Determinants of Radio broadcast assistant  Strain: Not on file  Food Insecurity: Not on file  Transportation Needs: Not on file  Physical Activity: Not on file  Stress: Not on file  Social Connections: Not on file  Intimate Partner Violence: Not on file    FAMILY HISTORY: Family History  Problem Relation Age of Onset   Breast cancer Mother 10   Breast cancer Maternal Aunt        d. under 68    ALLERGIES:  is allergic to paclitaxel, amlodipine, and penicillins.  MEDICATIONS:  No current facility-administered medications for  this visit.   Current Outpatient Medications  Medication Sig Dispense Refill   acetaminophen (TYLENOL) 500 MG tablet Take 1,000 mg by mouth at bedtime.     albuterol (VENTOLIN HFA) 108 (90 Base) MCG/ACT inhaler Inhale 1-2 puffs into the lungs every 6 (six) hours as needed for wheezing or shortness of breath.     clonazePAM (KLONOPIN) 0.5 MG tablet Take 1 tablet (0.5 mg total) by mouth 2 (two) times daily. 30 tablet 0   ELIQUIS 5 MG TABS tablet Take 1 tablet (5 mg total) by mouth 2 (two) times daily. 60 tablet 3   feeding supplement (ENSURE ENLIVE / ENSURE PLUS) LIQD Take 237 mLs by mouth 2 (two) times daily between meals. 14220 mL 12   hydrocortisone butyrate (LUCOID) 0.1 % CREA cream Apply 1 application topically daily. 45 g 0   lidocaine-prilocaine (EMLA) cream Apply to affected area once (Patient taking differently: Apply 1 application topically daily as needed (pain). Apply to affected area once) 30 g 3   nystatin (MYCOSTATIN) 100000 UNIT/ML suspension Use as directed 5 mLs (500,000 Units total) in the mouth or throat 4 (four) times daily. (Patient not taking: Reported on 09/21/2021) 473 mL 0   oxyCODONE (OXY IR/ROXICODONE) 5 MG immediate release tablet Take 1 tablet (5 mg total) by mouth every 6 (six) hours as needed for severe pain. 60 tablet 0   prochlorperazine (COMPAZINE) 10 MG tablet Take 1 tablet (10 mg total) by mouth every 6 (six) hours as needed (Nausea or vomiting). 60 tablet 3   DULoxetine (CYMBALTA) 30 MG capsule Take 1 capsule (30 mg total) by mouth daily. (Patient not taking: Reported on 09/14/2021) 30 capsule 1   Multiple Vitamin (MULTIVITAMIN WITH MINERALS) TABS tablet Take 1 tablet by mouth daily. (Patient not taking: Reported on 09/14/2021)     ondansetron (ZOFRAN) 8 MG tablet Take 1 tablet (8 mg total) by mouth 2 (two) times daily as needed for refractory nausea / vomiting. Start on day 3 after carboplatin chemo. (Patient not taking: Reported on 09/14/2021) 30 tablet 1    potassium chloride SA (KLOR-CON) 20 MEQ tablet Take 1 tablet (20 mEq total) by mouth 2 (two) times daily. (Patient not taking: Reported on 09/14/2021) 60 tablet 0   promethazine (PHENERGAN) 25 MG suppository Place 1 suppository (25 mg total) rectally every 6 (six) hours as needed for nausea or vomiting. (Patient not taking: Reported on 09/14/2021) 12 each 0   Facility-Administered Medications Ordered in Other Visits  Medication Dose Route Frequency Provider Last Rate Last Admin   heparin lock flush 100 unit/mL  500 Units Intravenous Once Earlie Server, MD       LORazepam (ATIVAN) injection 0.5 mg  0.5 mg Intravenous Once PRN Earlie Server, MD       sodium chloride flush (NS) 0.9 % injection 10 mL  10 mL Intravenous PRN Earlie Server, MD   10 mL at 09/21/21 0849     PHYSICAL  EXAMINATION: ECOG PERFORMANCE STATUS: 2 - Symptomatic, <50% confined to bed Vitals:   09/21/21 0908  BP: (!) 89/66  Pulse: (!) 105  Temp: (!) 97.1 F (36.2 C)  SpO2: (!) 18%    There were no vitals filed for this visit.   Physical Exam Constitutional:      General: She is not in acute distress.    Appearance: She is obese.  HENT:     Head: Normocephalic and atraumatic.  Eyes:     General: No scleral icterus. Cardiovascular:     Rate and Rhythm: Regular rhythm. Tachycardia present.     Heart sounds: Normal heart sounds.  Pulmonary:     Effort: Pulmonary effort is normal. No respiratory distress.     Breath sounds: Normal breath sounds. No wheezing.  Abdominal:     General: Bowel sounds are normal. There is distension.     Palpations: Abdomen is soft.  Musculoskeletal:        General: No deformity. Normal range of motion.     Cervical back: Normal range of motion and neck supple.  Skin:    General: Skin is warm and dry.     Findings: No erythema or rash.  Neurological:     Mental Status: She is alert and oriented to person, place, and time. Mental status is at baseline.     Cranial Nerves: No cranial nerve  deficit.     Coordination: Coordination normal.  Psychiatric:        Mood and Affect: Mood normal.    LABORATORY DATA:  I have reviewed the data as listed Lab Results  Component Value Date   WBC 26.4 (H) 09/21/2021   HGB 8.8 (L) 09/21/2021   HCT 26.4 (L) 09/21/2021   MCV 97.1 09/21/2021   PLT 391 09/21/2021   Recent Labs    09/14/21 1011 09/21/21 0849 09/21/21 1400  NA 127* 124* 127*  K 3.9 3.8 4.0  CL 94* 86* 90*  CO2 21* 22 24  GLUCOSE 121* 114* 91  BUN 17 43* 43*  CREATININE 1.56* 3.36* 3.30*  CALCIUM 8.8* 9.7 9.7  GFRNONAA 38* 15* 15*  PROT 5.1* 6.2* 5.8*  ALBUMIN 1.9* 2.4* 2.4*  AST 32 37 39  ALT _0 ALKPHOS 123 136* 127*  BILITOT 0.5 0.6 0.9    Iron/TIBC/Ferritin/ %Sat    Component Value Date/Time   IRON 50 08/17/2021 0828   TIBC 186 (L) 08/17/2021 0828   FERRITIN 636 (H) 08/17/2021 0828   IRONPCTSAT 27 08/17/2021 0828      RADIOGRAPHIC STUDIES: I have personally reviewed the radiological images as listed and agreed with the findings in the report. DG Chest 2 View  Result Date: 09/21/2021 CLINICAL DATA:  Vomiting and weakness.  History of ovarian cancer. EXAM: CHEST - 2 VIEW COMPARISON:  Chest x-ray dated June 01, 2021. FINDINGS: Unchanged right chest wall port catheter. The heart size and mediastinal contours are within normal limits. Normal pulmonary vascularity. No focal consolidation, pleural effusion, or pneumothorax. No acute osseous abnormality. IMPRESSION: 1. No acute cardiopulmonary disease. Electronically Signed   By: Titus Dubin M.D.   On: 09/21/2021 11:10   DG Abd 1 View  Result Date: 09/07/2021 CLINICAL DATA:  Nausea, bloating EXAM: ABDOMEN - 1 VIEW COMPARISON:  09/02/2021 FINDINGS: The bowel gas pattern is normal. Cholecystectomy clips. Pelvic phleboliths. Minimal spurring in the lumbar spine. IMPRESSION: Normal bowel gas pattern.  No acute findings. Electronically Signed   By: Eden Emms.D.  On: 09/07/2021 10:39   DG Abd  1 View  Result Date: 09/02/2021 CLINICAL DATA:  61 year old female with history of nausea and vomiting. History of ovarian cancer. EXAM: ABDOMEN - 1 VIEW COMPARISON:  No priors. FINDINGS: The bowel gas pattern is normal. No radio-opaque calculi or other significant radiographic abnormality are seen. Surgical clips project over the right upper quadrant of the abdomen, likely from prior cholecystectomy. IMPRESSION: 1. Nonobstructive bowel gas pattern. 2. No pneumoperitoneum. Electronically Signed   By: Vinnie Langton M.D.   On: 09/02/2021 06:06   CT ABDOMEN PELVIS W CONTRAST  Result Date: 08/31/2021 CLINICAL DATA:  Abdominal pain, history of ovarian cancer EXAM: CT ABDOMEN AND PELVIS WITH CONTRAST TECHNIQUE: Multidetector CT imaging of the abdomen and pelvis was performed using the standard protocol following bolus administration of intravenous contrast. CONTRAST:  158m OMNIPAQUE IOHEXOL 300 MG/ML  SOLN COMPARISON:  CT abdomen and pelvis dated June 25, 2021 FINDINGS: Lower chest: No acute abnormality. Hepatobiliary: No focal liver abnormality is seen. Status post cholecystectomy. No biliary dilatation. In Pancreas: Unremarkable. No pancreatic ductal dilatation or surrounding inflammatory changes. Spleen: Normal in size without focal abnormality. Adrenals/Urinary Tract: Adrenal glands are unremarkable. Kidneys are normal, without renal calculi, focal lesion, or hydronephrosis. Bladder is unremarkable. Stomach/Bowel: Stomach is within normal limits. Appendix appears normal. No evidence of bowel wall thickening, distention, or inflammatory changes. Vascular/Lymphatic: Aortic atherosclerosis. No enlarged abdominal or pelvic lymph nodes. Reproductive: Uterus is surgically absent. Unchanged appearance of large cystic and solid mass of the right ovary, measures approximately 1.9 x 1.3 cm, when measured on coronal image, unchanged in size when remeasured in similar plane on prior exam. Other: Small fluid  containing umbilical hernia. Similar peritoneal thickening and nodularity. Small to moderate volume abdominal ascites, slightly decreased when compared to the prior exam. Musculoskeletal: No acute or significant osseous findings. IMPRESSION: 1. No acute findings in the abdomen or pelvis. 2. Unchanged appearance of large cystic and solid mass of the right ovary. 3. Similar peritoneal thickening and nodularity. 4. Small to moderate volume abdominal ascites, slightly decreased when compared with prior exam. 5.  Aortic Atherosclerosis (ICD10-I70.0). Electronically Signed   By: LYetta GlassmanM.D.   On: 08/31/2021 12:24   [place]acentesis  Result Date: 09/13/2021 INDICATION: ascites EXAM: ULTRASOUND GUIDED  PARACENTESIS MEDICATIONS: None. COMPLICATIONS: None immediate. PROCEDURE: Informed written consent was obtained from the patient after a discussion of the risks, benefits and alternatives to treatment. A timeout was performed prior to the initiation of the procedure. Initial ultrasound scanning demonstrates a large amount of ascites within the right lower abdominal quadrant. The right lower abdomen was prepped and draped in the usual sterile fashion. 1% lidocaine was used for local anesthesia. Following this, a 19 gauge, 10-cm, Yueh catheter was introduced. An ultrasound image was saved for documentation purposes. The paracentesis was performed. The catheter was removed and a dressing was applied. The patient tolerated the procedure well without immediate post procedural complication. FINDINGS: A total of approximately 4.0 of dark amber fluid was removed. IMPRESSION: Successful ultrasound-guided paracentesis yielding approximately 4.0 liters of dark amber peritoneal fluid. Electronically Signed   By: YAlbin FellingM.D.   On: 09/13/2021 08:31   [place]acentesis  Result Date: 09/07/2021 INDICATION: History of ovarian cancer. Please perform ultrasound-guided paracentesis for therapeutic purposes. EXAM:  ULTRASOUND-GUIDED PARACENTESIS COMPARISON:  Multiple previous ultrasound-guided paracenteses, most recently on 08/31/2021 yielding sphenoid cc of ascitic fluid MEDICATIONS: None. COMPLICATIONS: None immediate. TECHNIQUE: Informed written consent was obtained from the patient  after a discussion of the risks, benefits and alternatives to treatment. A timeout was performed prior to the initiation of the procedure. Initial ultrasound scanning demonstrates a large amount of ascites within the right lower abdomen which was subsequently prepped and draped in the usual sterile fashion. 1% lidocaine with epinephrine was used for local anesthesia. Under direct ultrasound guidance, a 19 gauge, 7-cm, Yueh catheter was introduced. An ultrasound image was saved for documentation purposed. The paracentesis was performed. The catheter was removed and a dressing was applied. The patient tolerated the procedure well without immediate post procedural complication. FINDINGS: A total of approximately 4.6 liters of blood-tinged serous fluid was removed. Samples were sent to the laboratory as requested by the clinical team. IMPRESSION: Successful ultrasound-guided paracentesis yielding 4.6 liters of peritoneal fluid. Electronically Signed   By: Sandi Mariscal M.D.   On: 09/07/2021 16:29   US Paracentesis  Result Date: 08/31/2021 INDICATION: Patient with history of ovarian cancer, recurrent ascites, new onset abdominal pain following paracentesis 08/29/2021. Request to IR for diagnostic and therapeutic paracentesis EXAM: ULTRASOUND GUIDED DIAGNOSTIC AND THERAPEUTIC PARACENTESIS MEDICATIONS: 8 mL 1% lidocaine COMPLICATIONS: None immediate. PROCEDURE: Informed written consent was obtained from the patient after a discussion of the risks, benefits and alternatives to treatment. A timeout was performed prior to the initiation of the procedure. Initial ultrasound scanning demonstrates a small amount of ascites within the right upper abdominal  quadrant. The right upper abdomen was prepped and draped in the usual sterile fashion. 1% lidocaine was used for local anesthesia. Following this, a 19 gauge, 7-cm, Yueh catheter was introduced. An ultrasound image was saved for documentation purposes. The paracentesis was performed. The catheter was removed and a dressing was applied. The patient tolerated the procedure well without immediate post procedural complication. FINDINGS: A total of approximately 300 mL of clear, dark serosanguineous fluid was removed. Samples were sent to the laboratory as requested by the clinical team. IMPRESSION: Successful ultrasound-guided paracentesis yielding 300 milliliters of peritoneal fluid. Read by Candiss Norse, PA-C Electronically Signed   By: Miachel Roux M.D.   On: 08/31/2021 14:48   US Paracentesis  Result Date: 08/29/2021 INDICATION: Patient with history of ovarian cancer recurrent ascites request for paracentesis. EXAM: ULTRASOUND GUIDED  PARACENTESIS MEDICATIONS: Local 1% lidocaine only. COMPLICATIONS: None immediate. PROCEDURE: Informed written consent was obtained from the patient after a discussion of the risks, benefits and alternatives to treatment. A timeout was performed prior to the initiation of the procedure. Initial ultrasound scanning demonstrates a large amount of ascites within the left lower abdominal quadrant. The left lower abdomen was prepped and draped in the usual sterile fashion. 1% lidocaine was used for local anesthesia. Following this, a 19 gauge, 7-cm, Yueh catheter was introduced. An ultrasound image was saved for documentation purposes. The paracentesis was performed. The catheter was removed and a dressing was applied. The patient tolerated the procedure well without immediate post procedural complication. FINDINGS: A total of approximately 4 L of amber colored fluid was removed. IMPRESSION: Successful ultrasound-guided paracentesis yielding 4 liters of peritoneal fluid. This exam  was performed by Tsosie Billing PA-C, and was supervised and interpreted by Dr. Vernard Gambles. Electronically Signed   By: Lucrezia Europe M.D.   On: 08/29/2021 16:23   US Paracentesis  Result Date: 08/22/2021 INDICATION: Patient with history of ovarian cancer and recurrent ascites request for paracentesis. EXAM: ULTRASOUND GUIDED  PARACENTESIS MEDICATIONS: Local 1% lidocaine only. COMPLICATIONS: None immediate. PROCEDURE: Informed written consent was obtained from the patient after a  discussion of the risks, benefits and alternatives to treatment. A timeout was performed prior to the initiation of the procedure. Initial ultrasound scanning demonstrates a large amount of ascites within the right lower abdominal quadrant. The right lower abdomen was prepped and draped in the usual sterile fashion. 1% lidocaine was used for local anesthesia. Following this, a 19 gauge, 7-cm, Yueh catheter was introduced. An ultrasound image was saved for documentation purposes. The paracentesis was performed. The catheter was removed and a dressing was applied. The patient tolerated the procedure well without immediate post procedural complication. FINDINGS: A total of approximately 4 liters of amber colored fluid was removed. IMPRESSION: Successful ultrasound-guided paracentesis yielding 4 liters of peritoneal fluid. This exam was performed by Tsosie Billing PA-C, and was supervised and interpreted by Dr. Pascal Lux. Electronically Signed   By: Sandi Mariscal M.D.   On: 08/22/2021 16:38      ASSESSMENT & PLAN:  1. Malignant neoplasm of right ovary ([place])   2. Malignant ascites   3. Hypoalbuminemia due to protein-calorie malnutrition ([place])   4. Neoplasm related pain   5. Intractable nausea and vomiting   6. Acute kidney injury [facility])    Cancer Staging  Ovarian cancer [facility]) Staging form: Ovary, Fallopian Tube, and Primary Peritoneal Carcinoma, AJCC 8th Edition - Clinical stage from 04/18/2021: FIGO Stage II, calculated as Stage Unknown (cT2,  cNX, cM0) - Signed by Earlie Server, MD on 04/19/2021   #locally advanced ovarian Cancer  Large ovarian cystic mass with malignant ascites, peritoneal nodularity She finished the planned  6 cycles of neoadjuvant chemo with G-CSF support. CA 125 has responded well.  CT showed stable size of ovarian mass and peritoneal nodularity There was plan for debulking surgery.  Unfortunately due to malnutrition, nausea vomiting, her appointment with gynecology oncology Dr. Theora Gianotti was canceled and surgery has been put on hold. Last chemo therapy on 08/17/2021.  #AKI, likely due to poor oral intake, nausea vomiting secondary to abdominal distention recurrent ascites. Creatinine has jumped up to be 3.  I recommend patient to go to emergency room to be admitted for continuous IV fluid, hopefully improve her her kidney function.  #Intractable nausea vomiting, due to recurrent ascites.  She will need IV antiemetics. #Recurrent malignant ascites. Patient has been on paracentesis weekly basis.  Will need paracentesis again during this admission.  Goals of care discussion, I had a lengthy discussion with the patient, her husband, and I also discussed with patient's daughter over the phone. Unfortunately, she is currently not a surgical candidate, and in my opinion she is less likely going to become a candidate in the near future [I would like to further discuss with Dr. Theora Gianotti next week].  Recurrent ascites was felt to be multifactorial previously.  She has image evidence of liver cirrhosis, however most recent liver biopsy did not confirm cirrhosis. Cytology was previously positive for malignancy. Recommend echocardiogram to rule out cardiac etiology.  I would consider incorporating bevacizumab to her current therapy regimen outpatient, hopefully angiogenesis inhibitor could help to reduce his ascites recurrence. Is a palliative way, drainage catheter may be considered to improve her symptoms.  #Hypoalbuminemia due to  malnutrition.  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 Eliquis was held during her recent admission, and also prior to liver biopsy, she has resumed after procedure.   # Intractable nausea and vomiting.  Will send patient to emergency room to be admitted for IV hydration and IV antiemetics.   # Anxiety, recommend klonopin 0.20m 1-2  time per day.   Recommend patient to go to emergency room for further evaluation.  She agrees with the plan.  I called ER triage nurse and gave the report.  Please consult palliative care service during that admission.  All questions were answered. The patient knows to call the clinic with any problems questions or concerns.  cc Adin Hector, MD   Follow up plan  TBD  We spent sufficient time to discuss many aspect of care, questions were answered to patient's satisfaction.  Earlie Server 09/07/2021

## 2021-09-21 NOTE — ED Provider Triage Note (Signed)
Emergency Medicine Provider Triage Evaluation Note  Brittany Montoya , a 61 y.o. female  was evaluated in triage.  Was sent here from cancer center due to abnormal labs. Has been vomiting since 08/30/21 and having loose stools.  Abdominal pain per patient  Review of Systems  Positive: Nausea, vomiting and diarrhea. Negative: No known fever, chills.  No cough.  Physical Exam  There were no vitals taken for this visit. Gen:   Awake, no distress brought by wheelchair due to weakness. Resp:  Normal effort, lungs without wheezing or rales. MSK:   Moves extremities without difficulty  Other:  Frail appearance  Medical Decision Making  Medically screening exam initiated at 10:30 AM.  Appropriate orders placed.  Brittany Montoya was informed that the remainder of the evaluation will be completed by another provider, this initial triage assessment does not replace that evaluation, and the importance of remaining in the ED until their evaluation is complete.     Johnn Hai, PA-C 09/21/21 1046

## 2021-09-21 NOTE — Progress Notes (Signed)
Nutrition  RD planning to see patient during infusion today but cancelled and patient sent to ED.  Inpatient RD team notified   Dominika Losey B. Zenia Resides, [place], [place] Registered Dietitian [phone] (mobile)

## 2021-09-21 NOTE — Telephone Encounter (Signed)
09/21/21-ADA form for disability completed- pending md signature. Will need to call family/sister to pick up form when form has been signed.

## 2021-09-22 ENCOUNTER — Observation Stay: Payer: BC Managed Care – PPO

## 2021-09-22 DIAGNOSIS — N179 Acute kidney failure, unspecified: Secondary | ICD-10-CM | POA: Diagnosis not present

## 2021-09-22 DIAGNOSIS — N1831 Chronic kidney disease, stage 3a: Secondary | ICD-10-CM | POA: Diagnosis not present

## 2021-09-22 LAB — RENAL FUNCTION PANEL
Albumin: 2.5 g/dL — ABNORMAL LOW (ref 3.5–5.0)
Anion gap: 11 (ref 5–15)
BUN: 39 mg/dL — ABNORMAL HIGH (ref 8–23)
CO2: 24 mmol/L (ref 22–32)
Calcium: 9 mg/dL (ref 8.9–10.3)
Chloride: 93 mmol/L — ABNORMAL LOW (ref 98–111)
Creatinine, Ser: 2.49 mg/dL — ABNORMAL HIGH (ref 0.44–1.00)
GFR, Estimated: 21 mL/min — ABNORMAL LOW (ref >60)
Glucose, Bld: 78 mg/dL (ref 70–99)
Phosphorus: 5 mg/dL — ABNORMAL HIGH (ref 2.5–4.6)
Potassium: 3.6 mmol/L (ref 3.5–5.1)
Sodium: 128 mmol/L — ABNORMAL LOW (ref 135–145)

## 2021-09-22 LAB — URINALYSIS, COMPLETE (UACMP) WITH MICROSCOPIC
Bacteria, UA: NONE SEEN
Bilirubin Urine: NEGATIVE
Glucose, UA: NEGATIVE mg/dL
Hgb urine dipstick: NEGATIVE
Ketones, ur: 5 mg/dL — AB
Leukocytes,Ua: NEGATIVE
Nitrite: NEGATIVE
Protein, ur: NEGATIVE mg/dL
Specific Gravity, Urine: 1.017 (ref 1.005–1.030)
pH: 5 (ref 5.0–8.0)

## 2021-09-22 LAB — CBC
HCT: 22 % — ABNORMAL LOW (ref 36.0–46.0)
Hemoglobin: 7.4 g/dL — ABNORMAL LOW (ref 12.0–15.0)
MCH: 33 pg (ref 26.0–34.0)
MCHC: 33.6 g/dL (ref 30.0–36.0)
MCV: 98.2 fL (ref 80.0–100.0)
Platelets: 340 10*3/uL (ref 150–400)
RBC: 2.24 MIL/uL — ABNORMAL LOW (ref 3.87–5.11)
RDW: 18.8 % — ABNORMAL HIGH (ref 11.5–15.5)
WBC: 20.7 10*3/uL — ABNORMAL HIGH (ref 4.0–10.5)
nRBC: 0 % (ref 0.0–0.2)

## 2021-09-22 LAB — MAGNESIUM: Magnesium: 1.7 mg/dL (ref 1.7–2.4)

## 2021-09-22 LAB — OSMOLALITY: Osmolality: 277 mOsm/kg (ref 275–295)

## 2021-09-22 LAB — OSMOLALITY, URINE: Osmolality, Ur: 453 mOsm/kg (ref 300–900)

## 2021-09-22 LAB — SODIUM, URINE, RANDOM: Sodium, Ur: <10 mmol/L

## 2021-09-22 LAB — TROPONIN I (HIGH SENSITIVITY): Troponin I (High Sensitivity): 17 ng/L (ref <18)

## 2021-09-22 LAB — CREATININE, URINE, RANDOM: Creatinine, Urine: 153 mg/dL

## 2021-09-22 LAB — CA 125: Cancer Antigen (CA) 125: 61.4 U/mL — ABNORMAL HIGH (ref 0.0–38.1)

## 2021-09-22 MED ORDER — METOCLOPRAMIDE HCL 5 MG/ML IJ SOLN
10.0000 mg | Freq: Three times a day (TID) | INTRAMUSCULAR | Status: DC
  Administered 2021-09-22 – 2021-09-25 (×9): 10 mg via INTRAVENOUS
  Filled 2021-09-22 (×9): qty 2

## 2021-09-22 MED ORDER — CHLORHEXIDINE GLUCONATE CLOTH 2 % EX PADS
6.0000 | MEDICATED_PAD | Freq: Every day | CUTANEOUS | Status: DC
  Administered 2021-09-22 – 2021-09-25 (×4): 6 via TOPICAL

## 2021-09-22 NOTE — Progress Notes (Signed)
Patient vomited upon admission from the ED 969ml bile.  She reports of feeling better now.  BP 84/55, 81/55 on 2nd recheck.  Rest of VSS. She is getting IVF.  Dr. Billie Ruddy has been notified and ordered a KUB.

## 2021-09-22 NOTE — ED Notes (Signed)
Informed RN bed assigned 

## 2021-09-22 NOTE — Progress Notes (Signed)
PROGRESS NOTE    Brittany Montoya  QPR:[id number] DOB: December 17, 1960 DOA: 09/21/2021 PCP: Adin Hector, MD  112A/112A-AA   Assessment & Plan:   Principal Problem:   Acute renal failure superimposed on stage 3a chronic kidney disease ([place]) Active Problems:   Malignant ascites   Ovarian cancer ([place])   Anemia   Hyponatremia   Nausea & vomiting   Hypoalbuminemia due to protein-calorie malnutrition ([place])   Hypomagnesemia   Hypercoagulable state ([place])   Hypercalcemia   Hyperphosphatemia   Brittany Montoya is a 61 y.o. female with medical history significant for ovarian cancer, malignant ascites (receives periodic paracentesis), obstructive sleep apnea (does not use CPAP), GERD, pulmonary embolism 04/12/2021 now on Eliquis, chronic kidney disease stage 3 (baseline Creatinine 1.5), anemia, arthritis, who presents from her oncologist office to the emergency department on 09/21/2021 with abnormal labs, vomiting, and abdominal pain.     Acute renal failure superimposed on stage 3a chronic kidney disease ([place]) Most likely related to dehydration from severe vomiting and poor oral intake.  Improved with IVF. Plan: --cont MIVF@50    Nausea & vomiting Severe.  Patient unable to keep anything down. She has taken oral Reglan at home and also other oral medications including oral Compazine and Zofran. --RN noted large vomit of 900 ml of bile, suggesting some element of gastroparesis Plan: --resume home Reglan as IV  Recurrent Malignant ascites --Neutrophile counts always elevated on ascites fluid, however, cx had been neg.  SBP had been ruled out. --Receives paracentesis every week. --s/p paracentesis on admission with 5L removed Plan: --Consider placement of permanent drainage catheter due to recurrence of ascites.    Ovarian cancer [facility]) Has been on chemotherapy most recently in early December 2022.  Followed by oncologist, Dr. Tasia Catchings.   Plan:  --palliative consult, rec by  oncology     Hypercoagulable state [facility] Likely related to her malignancy.  PE on 04/12/2021. Plan:  --cont home Eliquis     Hyponatremia Mild.  Likely related to dehydration and poor oral intake.  Improved with IVF. --monitor Na --cont NS@50      Anemia of chronic disease    Hypoalbuminemia  --due to poor oral intake and frequent loss from paracentesis     Hypomagnesemia --monitor and replete PRN     Abdominal pain, generalized Plan: IV morphine as needed.   DVT prophylaxis: LD:[id number] Code Status: Full code  Family Communication:  Level of care: Med-Surg Dispo:   The patient is from: home Anticipated d/c is to: home Anticipated d/c date is: 2-3 days Patient currently is not medically ready to d/c due to: N/V not tolerating oral intake, AKI on IVF   Subjective and Interval History:  Pt reported abdominal pain better after paracentesis.  Still has some nausea.  No diarrhea.  Later, RN reported pt vomited 900 ml of bile.  KUB wnl.   Objective: Vitals:   09/22/21 1037 09/22/21 1524 09/22/21 1710 09/22/21 1715  BP: 95/60 (!) 99/58 (!) 84/55 (!) 81/55  Pulse: 75 99 95   Resp: 17 17 17    Temp: 98.8 F (37.1 C) 98.7 F (37.1 C) 98.2 F (36.8 C)   TempSrc: Oral Oral Oral   SpO2: 99% 98% 96%   Weight:   78 kg   Height:   5\' 1"  (1.549 m)     Intake/Output Summary (Last 24 hours) at 09/22/2021 1814 Last data filed at 09/22/2021 1700 Gross per 24 hour  Intake 2092.29 ml  Output 900 ml  Net  1192.29 ml   Filed Weights   09/21/21 1031 09/22/21 1710  Weight: 81.6 kg 78 kg    Examination:   Constitutional: NAD, AAOx3 HEENT: conjunctivae and lids normal, EOMI CV: No cyanosis.   RESP: normal respiratory effort, on RA GI: abdomen large but soft Extremities: 1-2+ pitting edema in BLE SKIN: warm, dry Neuro: II - XII grossly intact.   Psych: Normal mood and affect.  Appropriate judgement and reason   Data Reviewed: I have personally reviewed following labs and  imaging studies  CBC: Recent Labs  Lab 09/21/21 0849 09/21/21 1400 09/22/21 0750  WBC 28.2* 26.4* 20.7*  NEUTROABS 26.0* 24.2*  --   HGB 8.9* 8.8* 7.4*  HCT 26.3* 26.4* 22.0*  MCV 96.3 97.1 98.2  PLT 407* 391 623   Basic Metabolic Panel: Recent Labs  Lab 09/21/21 0849 09/21/21 1400 09/22/21 0750  NA 124* 127* 128*  K 3.8 4.0 3.6  CL 86* 90* 93*  CO2 22 24 24   GLUCOSE 114* 91 78  BUN 43* 43* 39*  CREATININE 3.36* 3.30* 2.49*  CALCIUM 9.7 9.7 9.0  MG  --  1.5* 1.7  PHOS  --  5.6* 5.0*   GFR: Estimated Creatinine Clearance: 22.4 mL/min (A) (by C-G formula based on SCr of 2.49 mg/dL (H)). Liver Function Tests: Recent Labs  Lab 09/21/21 0849 09/21/21 1400 09/22/21 0750  AST 37 39  --   ALT 15 16  --   ALKPHOS 136* 127*  --   BILITOT 0.6 0.9  --   PROT 6.2* 5.8*  --   ALBUMIN 2.4* 2.4* 2.5*   No results for input(s): LIPASE, AMYLASE in the last 168 hours. No results for input(s): AMMONIA in the last 168 hours. Coagulation Profile: No results for input(s): INR, PROTIME in the last 168 hours. Cardiac Enzymes: No results for input(s): CKTOTAL, CKMB, CKMBINDEX, TROPONINI in the last 168 hours. BNP (last 3 results) No results for input(s): PROBNP in the last 8760 hours. HbA1C: No results for input(s): HGBA1C in the last 72 hours. CBG: No results for input(s): GLUCAP in the last 168 hours. Lipid Profile: No results for input(s): CHOL, HDL, LDLCALC, TRIG, CHOLHDL, LDLDIRECT in the last 72 hours. Thyroid Function Tests: No results for input(s): TSH, T4TOTAL, FREET4, T3FREE, THYROIDAB in the last 72 hours. Anemia Panel: No results for input(s): VITAMINB12, FOLATE, FERRITIN, TIBC, IRON, RETICCTPCT in the last 72 hours. Sepsis Labs: No results for input(s): PROCALCITON, LATICACIDVEN in the last 168 hours.  Recent Results (from the past 240 hour(s))  Resp Panel by RT-PCR (Flu A&B, Covid) Nasopharyngeal Swab     Status: None   Collection Time: 09/21/21  2:03 PM    Specimen: Nasopharyngeal Swab; Nasopharyngeal(NP) swabs in vial transport medium  Result Value Ref Range Status   SARS Coronavirus 2 by RT PCR NEGATIVE NEGATIVE Final    Comment: (NOTE) SARS-CoV-2 target nucleic acids are NOT DETECTED.  The SARS-CoV-2 RNA is generally detectable in upper respiratory specimens during the acute phase of infection. The lowest concentration of SARS-CoV-2 viral copies this assay can detect is 138 copies/mL. A negative result does not preclude SARS-Cov-2 infection and should not be used as the sole basis for treatment or other patient management decisions. A negative result may occur with  improper specimen collection/handling, submission of specimen other than nasopharyngeal swab, presence of viral mutation(s) within the areas targeted by this assay, and inadequate number of viral copies(<138 copies/mL). A negative result must be combined with clinical observations, patient history,  and epidemiological information. The expected result is Negative.  Fact Sheet for Patients:  EntrepreneurPulse.com.au  Fact Sheet for Healthcare Providers:  IncredibleEmployment.be  This test is no t yet approved or cleared by the [place] FDA and  has been authorized for detection and/or diagnosis of SARS-CoV-2 by FDA under an Emergency Use Authorization (EUA). This EUA will remain  in effect (meaning this test can be used) for the duration of the COVID-19 declaration under Section 564(b)(1) of the Act, 21 U.S.C.section 360bbb-3(b)(1), unless the authorization is terminated  or revoked sooner.       Influenza A by PCR NEGATIVE NEGATIVE Final   Influenza B by PCR NEGATIVE NEGATIVE Final    Comment: (NOTE) The Xpert Xpress SARS-CoV-2/FLU/RSV plus assay is intended as an aid in the diagnosis of influenza from Nasopharyngeal swab specimens and should not be used as a sole basis for treatment. Nasal washings and aspirates are  unacceptable for Xpert Xpress SARS-CoV-2/FLU/RSV testing.  Fact Sheet for Patients: EntrepreneurPulse.com.au  Fact Sheet for Healthcare Providers: IncredibleEmployment.be  This test is not yet approved or cleared by the [place] FDA and has been authorized for detection and/or diagnosis of SARS-CoV-2 by FDA under an Emergency Use Authorization (EUA). This EUA will remain in effect (meaning this test can be used) for the duration of the COVID-19 declaration under Section 564(b)(1) of the Act, 21 U.S.C. section 360bbb-3(b)(1), unless the authorization is terminated or revoked.  Performed at [facility], [address] [place], [place] [address]       Radiology Studies: DG Chest 2 View  Result Date: 09/21/2021 CLINICAL DATA:  Vomiting and weakness.  History of ovarian cancer. EXAM: CHEST - 2 VIEW COMPARISON:  Chest x-ray dated June 01, 2021. FINDINGS: Unchanged right chest wall port catheter. The heart size and mediastinal contours are within normal limits. Normal pulmonary vascularity. No focal consolidation, pleural effusion, or pneumothorax. No acute osseous abnormality. IMPRESSION: 1. No acute cardiopulmonary disease. Electronically Signed   By: Titus Dubin M.D.   On: 09/21/2021 11:10   US RENAL  Result Date: 09/21/2021 CLINICAL DATA:  Acute renal failure. EXAM: RENAL / URINARY TRACT ULTRASOUND COMPLETE COMPARISON:  None. FINDINGS: Right Kidney: Renal measurements: 9.7 cm x 4.9 cm x 4.5 cm = volume: 109.5 mL. Echogenicity within normal limits. No mass or hydronephrosis visualized. Left Kidney: Renal measurements: 9.9 cm x 4.5 cm x 4.3 cm = volume: 101.03 mL. Echogenicity within normal limits. No mass or hydronephrosis visualized. Bladder: Appears normal for degree of bladder distention. Other: None. IMPRESSION: Normal renal ultrasound. Electronically Signed   By: Virgina Norfolk M.D.   On: 09/21/2021 22:33   US  Paracentesis  Result Date: 09/21/2021 INDICATION: Patient with a history of ovarian cancer and recurrent large volume ascites presents today for therapeutic diagnostic paracentesis. EXAM: ULTRASOUND GUIDED PARACENTESIS MEDICATIONS: 1% lidocaine 10 mL COMPLICATIONS: None immediate. PROCEDURE: Informed written consent was obtained from the patient after a discussion of the risks, benefits and alternatives to treatment. A timeout was performed prior to the initiation of the procedure. Initial ultrasound scanning demonstrates a large amount of ascites within the right lower abdominal quadrant. The right lower abdomen was prepped and draped in the usual sterile fashion. 1% lidocaine was used for local anesthesia. Following this, a 19 gauge, 7-cm, Yueh catheter was introduced. An ultrasound image was saved for documentation purposes. The paracentesis was performed. The catheter was removed and a dressing was applied. The patient tolerated the procedure well without immediate post procedural  complication. FINDINGS: A total of approximately 5 L of amber-colored fluid was removed. Samples were sent to the laboratory as requested by the clinical team. IMPRESSION: Successful ultrasound-guided paracentesis yielding 5 liters of peritoneal fluid. Read by: Soyla Dryer, NP Electronically Signed   By: Aletta Edouard M.D.   On: 09/21/2021 17:15     Scheduled Meds:  acetaminophen  1,000 mg Oral QHS   apixaban  5 mg Oral BID   Chlorhexidine Gluconate Cloth  6 each Topical Daily   clonazePAM  0.5 mg Oral BID   feeding supplement  237 mL Oral BID BM   Continuous Infusions:  sodium chloride 50 mL/hr at 09/22/21 1742     LOS: 0 days     Enzo Bi, MD [facility] If 7PM-7AM, please contact night-coverage 09/22/2021, 6:14 PM

## 2021-09-22 NOTE — Evaluation (Signed)
Occupational Therapy Evaluation Patient Details Name: Brittany Montoya MRN: [id number] DOB: Nov 03, 1960 Today's Date: 09/22/2021   History of Present Illness Pt is a 61 y.o. female with medical history significant for  ovarian cancer on chemotherapy, liver cirrhosis with ascites, hypertension, GERD, depression with anxiety, thrombocytopenia, and PE on Eliquis who presented to the ED after being called by Dr. Tasia Catchings for lab abnormalities including hyponatremia, hypomagnesemia, and worsening renal function. Pt s/p paracentesis of R abdomen that yielded 5L of fluid.   Clinical Impression   [name] presents today with generalized weakness, limited endurance, impaired balance, fatigue, nausea and vomiting. Prior to admission, she lived at home with her husband, with 2 daughters nearby providing assistance as needed. Family reports that they are able to provide 24-hour care. Ms. He reports that she had one fall at home 3 days ago, when she lost her balance and fell backwards while walking with her RW. During today's evaluation, she was able to complete bed mobility, transfers, balance without physical assistance but with SUPV for safety, 2/2 pt weakness, and with increased time and effort and frequent rest breaks. Provided educ re: energy conservation, falls prevention, home exercise program to maintain strength, movement/repositioning to prevent skin breakdown. Continue to offer OT services while pt is hospitalized. Pt reports she understands she has limited time, and that she wants to be at home with her family and be comfortable. Family present in room (spouse, daughter) confirm that they are able to provide 24 hr care and that they also have friends who visit and can assist. Recommend DC to home with [facility], consult with palliative care/hospice. Pt would also benefit from hospital bed at home to improve bed mobility and repositioning.    Recommendations for follow up therapy are one component of a  multi-disciplinary discharge planning process, led by the attending physician.  Recommendations may be updated based on patient status, additional functional criteria and insurance authorization.   Follow Up Recommendations  Home health OT    Assistance Recommended at Discharge Frequent or constant Supervision/Assistance  Patient can return home with the following A little help with walking and/or transfers;A little help with bathing/dressing/bathroom;Assistance with cooking/housework;Assist for transportation;Help with stairs or ramp for entrance    Functional Status Assessment  Patient has had a recent decline in their functional status and demonstrates the ability to make significant improvements in function in a reasonable and predictable amount of time.  Equipment Recommendations  Hospital bed    Recommendations for Other Services  (Hospice/palliative care consult. Pt states she wants to be at home and comfortable.)     Precautions / Restrictions Precautions Precautions: None Precaution Comments: mod fall risk Restrictions Weight Bearing Restrictions: No      Mobility Bed Mobility Overal bed mobility: Needs Assistance Bed Mobility: Supine to Sit;Sit to Supine     Supine to sit: Supervision Sit to supine: Supervision   General bed mobility comments: increased time, effort    Transfers Overall transfer level: Needs assistance Equipment used: Rolling walker (2 wheels)   Sit to Stand: Min guard;From elevated surface                  Balance Overall balance assessment: Needs assistance Sitting-balance support: Bilateral upper extremity supported;Feet unsupported Sitting balance-Leahy Scale: Good     Standing balance support: Bilateral upper extremity supported Standing balance-Leahy Scale: Fair  ADL either performed or assessed with clinical judgement   ADL Overall ADL's : Needs assistance/impaired                                              Vision Patient Visual Report: No change from baseline       Perception     Praxis      Pertinent Vitals/Pain Pain Assessment: Faces Faces Pain Scale: Hurts a little bit Pain Location: LE Pain Intervention(s): Repositioned     Hand Dominance     Extremity/Trunk Assessment Upper Extremity Assessment Upper Extremity Assessment: Generalized weakness   Lower Extremity Assessment Lower Extremity Assessment: Generalized weakness       Communication Communication Communication: No difficulties   Cognition Arousal/Alertness: Awake/alert   Overall Cognitive Status: Within Functional Limits for tasks assessed                                       General Comments       Exercises Other Exercises Other Exercises: Educ re UE therex, bed mobility, importance of repositioning to prevent skin breakdown, energy conservation strategies, anxiety managment strategies   Shoulder Instructions      Home Living Family/patient expects to be discharged to:: Private residence Living Arrangements: Spouse/significant other Available Help at Discharge: Family;Available 24 hours/day Type of Home: House Home Access: Stairs to enter [employer] of Steps: 6 Entrance Stairs-Rails: Right;Left Home Layout: One level     Bathroom Shower/Tub: Tub/shower unit;Walk-in shower   Bathroom Toilet: Handicapped height     Home Equipment: Conservation officer, nature (2 wheels);Cane - single point;BSC/3in1;Wheelchair - manual;Shower seat - built in   Additional Comments: Uses cane for mobility (for past month due to decreased balance). Plans to use walk-in shower at home.      Prior Functioning/Environment Prior Level of Function : Independent/Modified Independent             Mobility Comments: Mod Ind amb with a RW with 1 fall in the last 6 months secondary to LE weakness ADLs Comments: Has required assistance with toileting for  past month        OT Problem List: Impaired balance (sitting and/or standing);Decreased strength;Decreased range of motion;Decreased activity tolerance;Decreased coordination;Increased edema      OT Treatment/Interventions: Self-care/ADL training;DME and/or AE instruction;Therapeutic activities;Balance training;Therapeutic exercise;Energy conservation    OT Goals(Current goals can be found in the care plan section) Acute Rehab OT Goals Patient Stated Goal: to be at home with her family OT Goal Formulation: With patient Time For Goal Achievement: 10/06/21 Potential to Achieve Goals: Good ADL Goals Pt Will Transfer to Toilet: with min guard assist (using LRAD) Additional ADL Goal #1: Pt will identify/demonstrate 2+ falls prevention techniques Additional ADL Goal #2: Pt will identify/demonstrate 2+ energy conservation strategies  OT Frequency: Min 2X/week    Co-evaluation              AM-PAC OT "6 Clicks" Daily Activity     Outcome Measure Help from another person eating meals?: None Help from another person taking care of personal grooming?: A Little Help from another person toileting, which includes using toliet, bedpan, or urinal?: A Little Help from another person bathing (including washing, rinsing, drying)?: A Little Help from another person to put on and taking off regular  upper body clothing?: A Little Help from another person to put on and taking off regular lower body clothing?: A Lot 6 Click Score: 18   End of Session Equipment Utilized During Treatment: Rolling walker (2 wheels) Nurse Communication: Mobility status  Activity Tolerance: Other (comment);Patient limited by fatigue (Pt limited by nausea) Patient left: in bed;with family/visitor present;with nursing/sitter in room;with call bell/phone within reach  OT Visit Diagnosis: Unsteadiness on feet (R26.81);Muscle weakness (generalized) (M62.81)                Time: 8115-7262 OT Time Calculation (min): 23  min Charges:  OT General Charges $OT Visit: 1 Visit OT Evaluation $OT Eval Moderate Complexity: 1 Mod OT Treatments $Self Care/Home Management : 23-37 mins Josiah Lobo, PhD, MS, OTR/L 09/22/21, 12:47 PM

## 2021-09-23 DIAGNOSIS — D6859 Other primary thrombophilia: Secondary | ICD-10-CM | POA: Diagnosis present

## 2021-09-23 DIAGNOSIS — E8809 Other disorders of plasma-protein metabolism, not elsewhere classified: Secondary | ICD-10-CM | POA: Diagnosis present

## 2021-09-23 DIAGNOSIS — F32A Depression, unspecified: Secondary | ICD-10-CM | POA: Diagnosis present

## 2021-09-23 DIAGNOSIS — Z515 Encounter for palliative care: Secondary | ICD-10-CM | POA: Diagnosis not present

## 2021-09-23 DIAGNOSIS — G4733 Obstructive sleep apnea (adult) (pediatric): Secondary | ICD-10-CM | POA: Diagnosis present

## 2021-09-23 DIAGNOSIS — E860 Dehydration: Secondary | ICD-10-CM | POA: Diagnosis present

## 2021-09-23 DIAGNOSIS — K7460 Unspecified cirrhosis of liver: Secondary | ICD-10-CM | POA: Diagnosis present

## 2021-09-23 DIAGNOSIS — N179 Acute kidney failure, unspecified: Secondary | ICD-10-CM | POA: Diagnosis present

## 2021-09-23 DIAGNOSIS — R180 Malignant ascites: Secondary | ICD-10-CM | POA: Diagnosis present

## 2021-09-23 DIAGNOSIS — I129 Hypertensive chronic kidney disease with stage 1 through stage 4 chronic kidney disease, or unspecified chronic kidney disease: Secondary | ICD-10-CM | POA: Diagnosis present

## 2021-09-23 DIAGNOSIS — R1084 Generalized abdominal pain: Secondary | ICD-10-CM | POA: Diagnosis present

## 2021-09-23 DIAGNOSIS — Z20822 Contact with and (suspected) exposure to covid-19: Secondary | ICD-10-CM | POA: Diagnosis present

## 2021-09-23 DIAGNOSIS — C569 Malignant neoplasm of unspecified ovary: Secondary | ICD-10-CM | POA: Diagnosis present

## 2021-09-23 DIAGNOSIS — R188 Other ascites: Secondary | ICD-10-CM | POA: Diagnosis present

## 2021-09-23 DIAGNOSIS — R112 Nausea with vomiting, unspecified: Secondary | ICD-10-CM | POA: Diagnosis present

## 2021-09-23 DIAGNOSIS — Z86711 Personal history of pulmonary embolism: Secondary | ICD-10-CM | POA: Diagnosis not present

## 2021-09-23 DIAGNOSIS — D630 Anemia in neoplastic disease: Secondary | ICD-10-CM | POA: Diagnosis present

## 2021-09-23 DIAGNOSIS — E871 Hypo-osmolality and hyponatremia: Secondary | ICD-10-CM | POA: Diagnosis present

## 2021-09-23 DIAGNOSIS — Z880 Allergy status to penicillin: Secondary | ICD-10-CM | POA: Diagnosis not present

## 2021-09-23 DIAGNOSIS — Z9221 Personal history of antineoplastic chemotherapy: Secondary | ICD-10-CM | POA: Diagnosis not present

## 2021-09-23 DIAGNOSIS — Z888 Allergy status to other drugs, medicaments and biological substances status: Secondary | ICD-10-CM | POA: Diagnosis not present

## 2021-09-23 DIAGNOSIS — N1831 Chronic kidney disease, stage 3a: Secondary | ICD-10-CM | POA: Diagnosis present

## 2021-09-23 LAB — BASIC METABOLIC PANEL
Anion gap: 11 (ref 5–15)
BUN: 34 mg/dL — ABNORMAL HIGH (ref 8–23)
CO2: 23 mmol/L (ref 22–32)
Calcium: 8.5 mg/dL — ABNORMAL LOW (ref 8.9–10.3)
Chloride: 96 mmol/L — ABNORMAL LOW (ref 98–111)
Creatinine, Ser: 2.14 mg/dL — ABNORMAL HIGH (ref 0.44–1.00)
GFR, Estimated: 26 mL/min — ABNORMAL LOW (ref >60)
Glucose, Bld: 64 mg/dL — ABNORMAL LOW (ref 70–99)
Potassium: 3.5 mmol/L (ref 3.5–5.1)
Sodium: 130 mmol/L — ABNORMAL LOW (ref 135–145)

## 2021-09-23 LAB — CBC
HCT: 20.5 % — ABNORMAL LOW (ref 36.0–46.0)
Hemoglobin: 6.9 g/dL — ABNORMAL LOW (ref 12.0–15.0)
MCH: 33.3 pg (ref 26.0–34.0)
MCHC: 33.7 g/dL (ref 30.0–36.0)
MCV: 99 fL (ref 80.0–100.0)
Platelets: 307 10*3/uL (ref 150–400)
RBC: 2.07 MIL/uL — ABNORMAL LOW (ref 3.87–5.11)
RDW: 19 % — ABNORMAL HIGH (ref 11.5–15.5)
WBC: 20.9 10*3/uL — ABNORMAL HIGH (ref 4.0–10.5)
nRBC: 0 % (ref 0.0–0.2)

## 2021-09-23 LAB — MAGNESIUM: Magnesium: 1.6 mg/dL — ABNORMAL LOW (ref 1.7–2.4)

## 2021-09-23 LAB — GLUCOSE, CAPILLARY
Glucose-Capillary: 59 mg/dL — ABNORMAL LOW (ref 70–99)
Glucose-Capillary: 59 mg/dL — ABNORMAL LOW (ref 70–99)
Glucose-Capillary: 62 mg/dL — ABNORMAL LOW (ref 70–99)
Glucose-Capillary: 70 mg/dL (ref 70–99)
Glucose-Capillary: 108 mg/dL — ABNORMAL HIGH (ref 70–99)

## 2021-09-23 LAB — PREPARE RBC (CROSSMATCH)

## 2021-09-23 MED ORDER — MAGNESIUM SULFATE 2 GM/50ML IV SOLN
2.0000 g | Freq: Once | INTRAVENOUS | Status: AC
  Administered 2021-09-23: 11:00:00 2 g via INTRAVENOUS
  Filled 2021-09-23: qty 50

## 2021-09-23 MED ORDER — SODIUM CHLORIDE 0.9% IV SOLUTION: Freq: Once | INTRAVENOUS | Status: AC

## 2021-09-23 NOTE — Progress Notes (Signed)
BP 85/56. Rest of VSS post blood transfusion.  Dr. Billie Ruddy made aware. No new orders received at this time.

## 2021-09-23 NOTE — Progress Notes (Signed)
Patient's BP 75/53, 80/51 at recheck. Rest of VSS.  Patient alert and oriented x4.  Hgb 6.9.  CBG 59.  Mews score 2.  Grape juice given per patient request.  Dr. Billie Ruddy made aware and will order blood transfusion.

## 2021-09-23 NOTE — Progress Notes (Signed)
Patient BP 84/61 and retake 81/46, hospitalist made aware, will continue to monitor

## 2021-09-23 NOTE — Progress Notes (Signed)
PROGRESS NOTE    Brittany Montoya  WNU:[id number] DOB: 1960/11/07 DOA: 09/21/2021 PCP: Adin Hector, MD  [id number]   Assessment & Plan:   Principal Problem:   Acute renal failure superimposed on stage 3a chronic kidney disease ([place]) Active Problems:   Malignant ascites   Ovarian cancer ([place])   Anemia   Hyponatremia   Nausea & vomiting   Hypoalbuminemia due to protein-calorie malnutrition ([place])   Hypomagnesemia   Hypercoagulable state ([place])   Hypercalcemia   Hyperphosphatemia   AKI (acute kidney injury) ([place])   Brittany Montoya is a 61 y.o. female with medical history significant for ovarian cancer, malignant ascites (receives periodic paracentesis), obstructive sleep apnea (does not use CPAP), GERD, pulmonary embolism 04/12/2021 now on Eliquis, chronic kidney disease stage 3 (baseline Creatinine 1.5), anemia, arthritis, who presents from her oncologist office to the emergency department on 09/21/2021 with abnormal labs, vomiting, and abdominal pain.     Acute renal failure superimposed on stage 3a chronic kidney disease ([place]) Most likely related to dehydration from severe vomiting and poor oral intake.  Improved with IVF. Plan: --d/c MIVF today and encourage oral hydration   Nausea & vomiting, Severe.   Patient unable to keep anything down.  Pt said she hasn't tried Reglan at home, unlike what she told the admitter. --RN noted large vomit of 900 ml of bile evening of 1/7, suggesting some element of gastroparesis.  Plan: --cont IV Reglan (new) --advance to Full liquid today  Recurrent Malignant ascites --Neutrophile counts always elevated on ascites fluid, however, cx had been neg.  SBP had been ruled out. --Receives paracentesis every week. --s/p paracentesis on admission with 5L removed Plan: --Consider placement of permanent drainage catheter due to recurrence of ascites.    Ovarian cancer [facility]) Has been on chemotherapy most recently in early December  2022.  Followed by oncologist, Dr. Tasia Catchings.   Plan:  --palliative consult, rec by oncology     Hypercoagulable state [facility]) Likely related to her malignancy.  PE on 04/12/2021. Plan:  --cont home Eliquis     Hyponatremia Mild.  Likely related to dehydration and poor oral intake.  Improved with IVF. --monitor Na --d/c MIVF and encourage oral hydration     Anemia of chronic disease --1u pRBC today for Hgb 6.9    Hypoalbuminemia  --due to poor oral intake and frequent loss from paracentesis     Hypomagnesemia --monitor and replete PRN     Abdominal pain, generalized Plan: IV morphine as needed.   DVT prophylaxis: IH:[id number] Code Status: Full code  Family Communication:  Level of care: Med-Surg Dispo:   The patient is from: home Anticipated d/c is to: home Anticipated d/c date is: 2-3 days Patient currently is not medically ready to d/c due to: N/V not tolerating oral intake   Subjective and Interval History:  No N/V this morning.  No abdominal pain.     Objective: Vitals:   09/23/21 0756 09/23/21 1023 09/23/21 1230 09/23/21 1302  BP: (!) 80/51 105/70 (!) 86/60 (!) 85/59  Pulse: 77 (!) 103 66 81  Resp:  16 16 17   Temp:  98.3 F (36.8 C) 99.3 F (37.4 C) 98.7 F (37.1 C)  TempSrc:    Oral  SpO2:  95% 98% 98%  Weight:      Height:        Intake/Output Summary (Last 24 hours) at 09/23/2021 1435 Last data filed at 09/23/2021 1223 Gross per 24 hour  Intake 3102.04 ml  Output 1250 ml  Net 1852.04 ml   Filed Weights   09/21/21 1031 09/22/21 1710 09/23/21 0500  Weight: 81.6 kg 78 kg 79.6 kg    Examination:   Constitutional: NAD, AAOx3 HEENT: conjunctivae and lids normal, EOMI CV: No cyanosis.   RESP: normal respiratory effort, on RA GI: abdomen large but soft Extremities: 1+ pitting edema in BLE SKIN: warm, dry Neuro: II - XII grossly intact.   Psych: Normal mood and affect.  Appropriate judgement and reason   Data Reviewed: I have personally reviewed  following labs and imaging studies  CBC: Recent Labs  Lab 09/21/21 0849 09/21/21 1400 09/22/21 0750 09/23/21 0537  WBC 28.2* 26.4* 20.7* 20.9*  NEUTROABS 26.0* 24.2*  --   --   HGB 8.9* 8.8* 7.4* 6.9*  HCT 26.3* 26.4* 22.0* 20.5*  MCV 96.3 97.1 98.2 99.0  PLT 407* 391 340 782   Basic Metabolic Panel: Recent Labs  Lab 09/21/21 0849 09/21/21 1400 09/22/21 0750 09/23/21 0537  NA 124* 127* 128* 130*  K 3.8 4.0 3.6 3.5  CL 86* 90* 93* 96*  CO2 22 24 24 23   GLUCOSE 114* 91 78 64*  BUN 43* 43* 39* 34*  CREATININE 3.36* 3.30* 2.49* 2.14*  CALCIUM 9.7 9.7 9.0 8.5*  MG  --  1.5* 1.7 1.6*  PHOS  --  5.6* 5.0*  --    GFR: Estimated Creatinine Clearance: 26.4 mL/min (A) (by C-G formula based on SCr of 2.14 mg/dL (H)). Liver Function Tests: Recent Labs  Lab 09/21/21 0849 09/21/21 1400 09/22/21 0750  AST 37 39  --   ALT 15 16  --   ALKPHOS 136* 127*  --   BILITOT 0.6 0.9  --   PROT 6.2* 5.8*  --   ALBUMIN 2.4* 2.4* 2.5*   No results for input(s): LIPASE, AMYLASE in the last 168 hours. No results for input(s): AMMONIA in the last 168 hours. Coagulation Profile: No results for input(s): INR, PROTIME in the last 168 hours. Cardiac Enzymes: No results for input(s): CKTOTAL, CKMB, CKMBINDEX, TROPONINI in the last 168 hours. BNP (last 3 results) No results for input(s): PROBNP in the last 8760 hours. HbA1C: No results for input(s): HGBA1C in the last 72 hours. CBG: Recent Labs  Lab 09/23/21 0810 09/23/21 0852 09/23/21 0942 09/23/21 1024  GLUCAP 59* 59* 62* 70   Lipid Profile: No results for input(s): CHOL, HDL, LDLCALC, TRIG, CHOLHDL, LDLDIRECT in the last 72 hours. Thyroid Function Tests: No results for input(s): TSH, T4TOTAL, FREET4, T3FREE, THYROIDAB in the last 72 hours. Anemia Panel: No results for input(s): VITAMINB12, FOLATE, FERRITIN, TIBC, IRON, RETICCTPCT in the last 72 hours. Sepsis Labs: No results for input(s): PROCALCITON, LATICACIDVEN in the last  168 hours.  Recent Results (from the past 240 hour(s))  Resp Panel by RT-PCR (Flu A&B, Covid) Nasopharyngeal Swab     Status: None   Collection Time: 09/21/21  2:03 PM   Specimen: Nasopharyngeal Swab; Nasopharyngeal(NP) swabs in vial transport medium  Result Value Ref Range Status   SARS Coronavirus 2 by RT PCR NEGATIVE NEGATIVE Final    Comment: (NOTE) SARS-CoV-2 target nucleic acids are NOT DETECTED.  The SARS-CoV-2 RNA is generally detectable in upper respiratory specimens during the acute phase of infection. The lowest concentration of SARS-CoV-2 viral copies this assay can detect is 138 copies/mL. A negative result does not preclude SARS-Cov-2 infection and should not be used as the sole basis for treatment or other patient management decisions. A negative result  may occur with  improper specimen collection/handling, submission of specimen other than nasopharyngeal swab, presence of viral mutation(s) within the areas targeted by this assay, and inadequate number of viral copies(<138 copies/mL). A negative result must be combined with clinical observations, patient history, and epidemiological information. The expected result is Negative.  Fact Sheet for Patients:  EntrepreneurPulse.com.au  Fact Sheet for Healthcare Providers:  IncredibleEmployment.be  This test is no t yet approved or cleared by the [place] FDA and  has been authorized for detection and/or diagnosis of SARS-CoV-2 by FDA under an Emergency Use Authorization (EUA). This EUA will remain  in effect (meaning this test can be used) for the duration of the COVID-19 declaration under Section 564(b)(1) of the Act, 21 U.S.C.section 360bbb-3(b)(1), unless the authorization is terminated  or revoked sooner.       Influenza A by PCR NEGATIVE NEGATIVE Final   Influenza B by PCR NEGATIVE NEGATIVE Final    Comment: (NOTE) The Xpert Xpress SARS-CoV-2/FLU/RSV plus assay is  intended as an aid in the diagnosis of influenza from Nasopharyngeal swab specimens and should not be used as a sole basis for treatment. Nasal washings and aspirates are unacceptable for Xpert Xpress SARS-CoV-2/FLU/RSV testing.  Fact Sheet for Patients: EntrepreneurPulse.com.au  Fact Sheet for Healthcare Providers: IncredibleEmployment.be  This test is not yet approved or cleared by the [place] FDA and has been authorized for detection and/or diagnosis of SARS-CoV-2 by FDA under an Emergency Use Authorization (EUA). This EUA will remain in effect (meaning this test can be used) for the duration of the COVID-19 declaration under Section 564(b)(1) of the Act, 21 U.S.C. section 360bbb-3(b)(1), unless the authorization is terminated or revoked.  Performed at [facility], [address], [place], [place] [address]       Radiology Studies: DG Abd 1 View  Result Date: 09/22/2021 CLINICAL DATA:  Ovarian cancer with malignant ascites. Obstructive sleep apnea. Nausea and vomiting. EXAM: ABDOMEN - 1 VIEW COMPARISON:  09/07/2021 FINDINGS: Cholecystectomy clips noted. The bowel gas pattern is normal. No radio-opaque calculi or other significant radiographic abnormality are seen. IMPRESSION: Normal bowel gas pattern. Electronically Signed   By: Kerby Moors M.D.   On: 09/22/2021 18:12   US RENAL  Result Date: 09/21/2021 CLINICAL DATA:  Acute renal failure. EXAM: RENAL / URINARY TRACT ULTRASOUND COMPLETE COMPARISON:  None. FINDINGS: Right Kidney: Renal measurements: 9.7 cm x 4.9 cm x 4.5 cm = volume: 109.5 mL. Echogenicity within normal limits. No mass or hydronephrosis visualized. Left Kidney: Renal measurements: 9.9 cm x 4.5 cm x 4.3 cm = volume: 101.03 mL. Echogenicity within normal limits. No mass or hydronephrosis visualized. Bladder: Appears normal for degree of bladder distention. Other: None. IMPRESSION: Normal renal ultrasound.  Electronically Signed   By: Virgina Norfolk M.D.   On: 09/21/2021 22:33   US Paracentesis  Result Date: 09/21/2021 INDICATION: Patient with a history of ovarian cancer and recurrent large volume ascites presents today for therapeutic diagnostic paracentesis. EXAM: ULTRASOUND GUIDED PARACENTESIS MEDICATIONS: 1% lidocaine 10 mL COMPLICATIONS: None immediate. PROCEDURE: Informed written consent was obtained from the patient after a discussion of the risks, benefits and alternatives to treatment. A timeout was performed prior to the initiation of the procedure. Initial ultrasound scanning demonstrates a large amount of ascites within the right lower abdominal quadrant. The right lower abdomen was prepped and draped in the usual sterile fashion. 1% lidocaine was used for local anesthesia. Following this, a 19 gauge, 7-cm, Yueh catheter was introduced. An ultrasound  image was saved for documentation purposes. The paracentesis was performed. The catheter was removed and a dressing was applied. The patient tolerated the procedure well without immediate post procedural complication. FINDINGS: A total of approximately 5 L of amber-colored fluid was removed. Samples were sent to the laboratory as requested by the clinical team. IMPRESSION: Successful ultrasound-guided paracentesis yielding 5 liters of peritoneal fluid. Read by: Soyla Dryer, NP Electronically Signed   By: Aletta Edouard M.D.   On: 09/21/2021 17:15     Scheduled Meds:  acetaminophen  1,000 mg Oral QHS   apixaban  5 mg Oral BID   Chlorhexidine Gluconate Cloth  6 each Topical Daily   clonazePAM  0.5 mg Oral BID   feeding supplement  237 mL Oral BID BM   metoCLOPramide (REGLAN) injection  10 mg Intravenous Q8H   Continuous Infusions:     LOS: 0 days     Enzo Bi, MD [facility] If 7PM-7AM, please contact night-coverage 09/23/2021, 2:35 PM

## 2021-09-23 NOTE — Plan of Care (Signed)
Patient denies pain/n/v today.  She is eating a little better.  Tolerates full liquid diet.  Ambulated to the bathroom today with 1x assist and a rolling walker.

## 2021-09-24 ENCOUNTER — Ambulatory Visit: Payer: BC Managed Care – PPO

## 2021-09-24 ENCOUNTER — Encounter: Payer: Self-pay | Admitting: *Deleted

## 2021-09-24 DIAGNOSIS — N1831 Chronic kidney disease, stage 3a: Secondary | ICD-10-CM

## 2021-09-24 DIAGNOSIS — R112 Nausea with vomiting, unspecified: Secondary | ICD-10-CM

## 2021-09-24 DIAGNOSIS — Z515 Encounter for palliative care: Secondary | ICD-10-CM

## 2021-09-24 DIAGNOSIS — N179 Acute kidney failure, unspecified: Principal | ICD-10-CM

## 2021-09-24 LAB — BPAM RBC
Blood Product Expiration Date: 202301202359
ISSUE DATE / TIME: 202301081239
Unit Type and Rh: 6200

## 2021-09-24 LAB — CBC
HCT: 24.9 % — ABNORMAL LOW (ref 36.0–46.0)
Hemoglobin: 8.7 g/dL — ABNORMAL LOW (ref 12.0–15.0)
MCH: 32.6 pg (ref 26.0–34.0)
MCHC: 34.9 g/dL (ref 30.0–36.0)
MCV: 93.3 fL (ref 80.0–100.0)
Platelets: 321 10*3/uL (ref 150–400)
RBC: 2.67 MIL/uL — ABNORMAL LOW (ref 3.87–5.11)
RDW: 19.9 % — ABNORMAL HIGH (ref 11.5–15.5)
WBC: 21.5 10*3/uL — ABNORMAL HIGH (ref 4.0–10.5)
nRBC: 0 % (ref 0.0–0.2)

## 2021-09-24 LAB — BASIC METABOLIC PANEL
Anion gap: 7 (ref 5–15)
BUN: 28 mg/dL — ABNORMAL HIGH (ref 8–23)
CO2: 25 mmol/L (ref 22–32)
Calcium: 8.6 mg/dL — ABNORMAL LOW (ref 8.9–10.3)
Chloride: 98 mmol/L (ref 98–111)
Creatinine, Ser: 1.51 mg/dL — ABNORMAL HIGH (ref 0.44–1.00)
GFR, Estimated: 39 mL/min — ABNORMAL LOW (ref >60)
Glucose, Bld: 100 mg/dL — ABNORMAL HIGH (ref 70–99)
Potassium: 3.5 mmol/L (ref 3.5–5.1)
Sodium: 130 mmol/L — ABNORMAL LOW (ref 135–145)

## 2021-09-24 LAB — MICROALBUMIN / CREATININE URINE RATIO
Creatinine, Urine: 139.1 mg/dL
Microalb Creat Ratio: 6 mg/g creat (ref 0–29)
Microalb, Ur: 7.8 ug/mL — ABNORMAL HIGH

## 2021-09-24 LAB — GLUCOSE, CAPILLARY
Glucose-Capillary: 95 mg/dL (ref 70–99)
Glucose-Capillary: 104 mg/dL — ABNORMAL HIGH (ref 70–99)

## 2021-09-24 LAB — TYPE AND SCREEN
ABO/RH(D): A POS
Antibody Screen: NEGATIVE
Unit division: 0

## 2021-09-24 LAB — PROTEIN, BODY FLUID (OTHER): Total Protein, Body Fluid Other: 2.6 g/dL

## 2021-09-24 LAB — MAGNESIUM: Magnesium: 1.8 mg/dL (ref 1.7–2.4)

## 2021-09-24 MED ORDER — PROSOURCE PLUS PO LIQD
30.0000 mL | Freq: Three times a day (TID) | ORAL | Status: DC
  Administered 2021-09-24: 30 mL via ORAL
  Filled 2021-09-24 (×5): qty 30

## 2021-09-24 MED ORDER — BOOST / RESOURCE BREEZE PO LIQD CUSTOM
1.0000 | Freq: Three times a day (TID) | ORAL | Status: DC
  Administered 2021-09-24 (×2): 1 via ORAL

## 2021-09-24 MED ORDER — ADULT MULTIVITAMIN W/MINERALS CH
1.0000 | ORAL_TABLET | Freq: Every day | ORAL | Status: DC
  Filled 2021-09-24: qty 1

## 2021-09-24 NOTE — Progress Notes (Signed)
Initial Nutrition Assessment  DOCUMENTATION CODES:   Obesity unspecified  INTERVENTION:   -30 ml Proosurce Plus TID, each supplement prpvodes 100 kclas and 15 grams protein -Boost Breeze po TID, each supplement provides 250 kcal and 9 grams of protein  -MVI with minerals daily  NUTRITION DIAGNOSIS:   Increased nutrient needs related to cancer and cancer related treatments as evidenced by estimated needs.  GOAL:   Patient will meet greater than or equal to 90% of their needs  MONITOR:   PO intake, Supplement acceptance, Labs, Weight trends, Skin, I & O's  REASON FOR ASSESSMENT:   Malnutrition Screening Tool    ASSESSMENT:   Brittany Montoya is a 61 y.o. female with medical history significant for ovarian cancer, malignant ascites (receives periodic paracentesis), obstructive sleep apnea (does not use CPAP), GERD, pulmonary embolism 04/12/2021 now on Eliquis, chronic kidney disease stage 3 (baseline Creatinine 1.5), anemia, arthritis, who presents from her oncologist office to the emergency department on 09/21/2021 with abnormal labs, vomiting, and abdominal pain.  Pt admitted with acute renal failure superimposed on stage 3 CKD likely related to dehydration from severe vomiting and poor oral intake.   1/6- s/p rt paracentesis (5 L removed)  Reviewed I/O's: +1.8 L x 24 hours and +4.6 L since admission  Spoke with pt at bedside, who reports feeling better today. She shares that nausea and vomiting have improved to over the past 2-3 days; she denies nausea and vomiting at time of visit.Marland Kitchen Pt able to tolerate full liquid diet and estimated that she consumed 50% of meal this morning (yogurt, chocolate ice cream, and chocolate pudding). Noted meal completions 50-100%.   PTA pt reports poor appetite over the past few months related to nausea and vomiting. She expressed fear of eating secondary to this and consumes limited diet due to these experiences. Pt reports that she was  sipping on tea over the past few days PTA as it was the only thing she was able to keep down.   Pt reports UBW of around 200#. She estimates she has lost about 20 pounds progressively over the past 6 months (pt has been hospitalized to nausea and vomiting symptoms several times over the past few months). Reviewed wt hx; pt has experienced a 5.2% wt loss over the past month, which is significant for time frame.   Pt shares she has tried Boost (milk based), but this does not agree with her. She does like the juice based Boost supplements. RD reviewed available supplements on the formulary and is amenable to [employer]. Encouraged pt to chase it with water or juice.   Palliative care following for goals of care discussions. Plan for family meeting as an outpatient.   Labs reviewed: Na: 103, CBGS: 59-108.   NUTRITION - FOCUSED PHYSICAL EXAM:  Flowsheet Row Most Recent Value  Orbital Region No depletion  Upper Arm Region No depletion  Thoracic and Lumbar Region No depletion  Buccal Region No depletion  Temple Region No depletion  Clavicle Bone Region No depletion  Clavicle and Acromion Bone Region No depletion  Scapular Bone Region No depletion  Dorsal Hand No depletion  Patellar Region No depletion  Anterior Thigh Region No depletion  Posterior Calf Region No depletion  Edema (RD Assessment) Moderate  Hair Reviewed  Eyes Reviewed  Mouth Reviewed  Skin Reviewed  Nails Reviewed       Diet Order:   Diet Order             Diet  regular Room service appropriate? Yes; Fluid consistency: Thin  Diet effective now                   EDUCATION NEEDS:   Education needs have been addressed  Skin:  Skin Assessment: Reviewed RN Assessment  Last BM:  09/24/21  Height:   Ht Readings from Last 1 Encounters:  09/22/21 5\' 1"  (1.549 m)    Weight:   Wt Readings from Last 1 Encounters:  09/24/21 81.7 kg    Ideal Body Weight:  47.7 kg  BMI:  Body mass index is 34.03  kg/m.  Estimated Nutritional Needs:   Kcal:  1700-1900  Protein:  95-110 grams  Fluid:  > 1.7 L    Loistine Chance, RD, LDN, [place] Registered Dietitian II Certified Diabetes Care and Education Specialist Please refer to [facility] for RD and/or RD on-call/weekend/after hours pager

## 2021-09-24 NOTE — Consult Note (Signed)
[facility] at [facility] [phone] [phone]   Name: Brittany Montoya Date: 09/24/2021 MRN: [id number]  DOB: 1961-01-30  Patient Care Team: Adin Hector, MD as PCP - General (Internal Medicine) Earlie Server, MD as Consulting Physician (Hematology and Oncology) Clent Jacks, RN as Oncology Nurse Navigator    REASON FOR CONSULTATION: Brittany Montoya is a 61 y.o. 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  Patient is status post frequent large-volume paracentesis.  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  Patient has had significant intractable nausea and vomiting.  She was hospitalized 09/21/2021 with acute renal failure superimposed on stage IIIa CKD.  Palliative care was consulted to address goals.  SOCIAL HISTORY:     reports that she has never smoked. She has never used smokeless tobacco. She reports that she does not drink alcohol and does not use drugs.  Patient is married and lives at home with her husband.  She has a daughter who lives nearby and another daughter in [place].  ADVANCE DIRECTIVES:  Not on file  CODE STATUS: Full code  PAST MEDICAL HISTORY: Past Medical History:  Diagnosis Date   Anxiety    Arthritis    Ascites    Cancer associated pain    Cirrhosis of liver ([place])    DDD (degenerative disc disease), cervical    Depression    Family history of breast cancer    GERD (gastroesophageal reflux disease)    Glaucoma    Headache    migraines   Hypercoagulable state ([place])    PE 03/2021   Hypertension    Ovarian cancer ([place]) 04/15/2021   PONV (postoperative nausea and vomiting)    Pulmonary embolism ([place]) 04/12/2021   04/12/21 CTA: multiple bilateral lobar, segmental, and subsegmental PEs. Treated with Eliquis.    Sleep apnea    Venous stasis     PAST SURGICAL HISTORY:  Past Surgical History:  Procedure Laterality Date   ABDOMINAL HYSTERECTOMY N/A 2008   may have had left ovary removed   [place]     2012, 2015, 2020   COLONOSCOPY N/A 06/29/2021   Procedure: COLONOSCOPY;  Surgeon: Toledo, Benay Pike, MD;  Location: [facility] ENDOSCOPY;  Service: Gastroenterology;  Laterality: N/A;  DOCTORS ARE IN AGREEMENT THAT TOLEDO CAN DO THIS PROCEDURE IN RUSSO'S BLOCK   ESOPHAGOGASTRODUODENOSCOPY N/A 06/29/2021   Procedure: ESOPHAGOGASTRODUODENOSCOPY (EGD);  Surgeon: Toledo, Benay Pike, MD;  Location: [facility] ENDOSCOPY;  Service: Gastroenterology;  Laterality: N/A;   PARACENTESIS  04/20/2021   PILONIDAL CYST / SINUS EXCISION     PORTACATH PLACEMENT Right 06/01/2021   Procedure: INSERTION PORT-A-CATH;  Surgeon: Herbert Pun, MD;  Location: [facility] ORS;  Service: General;  Laterality: Right;    HEMATOLOGY/ONCOLOGY HISTORY:  Oncology History  Ovarian cancer ([place])  04/15/2021 Initial Diagnosis   Ovarian cancer ([place])   04/18/2021 Cancer Staging   Staging form: Ovary, Fallopian Tube, and Primary Peritoneal Carcinoma, AJCC 8th Edition - Clinical stage from 04/18/2021: FIGO Stage II, calculated as Stage Unknown (cT2, cNX, cM0) - Signed by Earlie Server, MD on 04/19/2021 Stage prefix: Initial diagnosis    04/19/2021 -  Chemotherapy   Patient is on Treatment Plan : OVARIAN Carboplatin (AUC 6) / Paclitaxel (175) q21d x 6 cycles  Genetic Testing   Negative genetic testing. No pathogenic variants identified on the [facility] panel. The report date is 06/25/2021.  The [facility] gene panel offered by [employer] includes sequencing and deletion/duplication testing of the following 47 genes:  APC, ATM, AXIN2, BAP1, BARD1, BMPR1A, BRIP1, BRCA1, BRCA2, CDH1, CDK4, CDKN2A, CHEK2, CTNNA1, FH, FLCN, HOXB13 (seq only), MEN1, MET, MLH1, MSH2, MSH3  (excluding repetitive portions of exon 1), MSH6, MUTYH, NTHL1, PALB2, PMS2, PTEN, RAD51C, RAD51D, SDHA, SDHB, SDHC, SDHD, SMAD4, STK11, TP53, TSC1, TSC2, VHL.  [employer] CDx HRD was also negative.      ALLERGIES:  is allergic to paclitaxel, amlodipine, and penicillins.  MEDICATIONS:  Current Facility-Administered Medications  Medication Dose Route Frequency Provider Last Rate Last Admin   acetaminophen (TYLENOL) tablet 650 mg  650 mg Oral Q6H PRN Tacey Ruiz, MD       Or   acetaminophen (TYLENOL) suppository 650 mg  650 mg Rectal Q6H PRN Tacey Ruiz, MD       acetaminophen (TYLENOL) tablet 1,000 mg  1,000 mg Oral QHS Tacey Ruiz, MD   1,000 mg at 09/23/21 2115   albuterol (PROVENTIL) (2.5 MG/3ML) 0.083% nebulizer solution 2.5 mg  2.5 mg Nebulization Q6H PRN Tacey Ruiz, MD       apixaban Arne Cleveland) tablet 5 mg  5 mg Oral BID Tacey Ruiz, MD   5 mg at 09/24/21 0919   bisacodyl (DULCOLAX) EC tablet 5 mg  5 mg Oral Daily PRN Tacey Ruiz, MD       Chlorhexidine Gluconate Cloth 2 % PADS 6 each  6 each Topical Daily Enzo Bi, MD   6 each at 09/24/21 1610   clonazePAM (KLONOPIN) tablet 0.5 mg  0.5 mg Oral BID Tacey Ruiz, MD   0.5 mg at 09/24/21 0919   feeding supplement (ENSURE ENLIVE / ENSURE PLUS) liquid 237 mL  237 mL Oral BID BM Tacey Ruiz, MD   237 mL at 09/22/21 2117   metoCLOPramide (REGLAN) injection 10 mg  10 mg Intravenous Azzie Roup, MD   10 mg at 09/24/21 0533   metoprolol tartrate (LOPRESSOR) injection 5 mg  5 mg Intravenous Q6H PRN Tacey Ruiz, MD       ondansetron [facility]) injection 4 mg  4 mg Intravenous Q6H PRN Tacey Ruiz, MD   4 mg at 09/22/21 1002   oxyCODONE (Oxy IR/ROXICODONE) immediate release tablet 5 mg  5 mg Oral Q4H PRN Tacey Ruiz, MD       prochlorperazine (COMPAZINE) injection 10 mg  10 mg Intravenous Q6H PRN Tacey Ruiz, MD   10 mg at 09/22/21 1045   senna-docusate (Senokot-S) tablet 1 tablet  1 tablet Oral QHS PRN  Tacey Ruiz, MD       Facility-Administered Medications Ordered in Other Encounters  Medication Dose Route Frequency Provider Last Rate Last Admin   heparin lock flush 100 unit/mL  500 Units Intravenous Once Earlie Server, MD       LORazepam (ATIVAN) injection 0.5 mg  0.5 mg Intravenous Once PRN Earlie Server, MD       sodium chloride flush (NS) 0.9 % injection 10 mL  10 mL Intravenous PRN Earlie Server, MD   10 mL at 09/21/21 0849    VITAL SIGNS: BP (!) 88/58 (BP Location: Left Arm)    Pulse 84    Temp 97.9 F (36.6 C) (Oral)    Resp 20    Ht _0  (1.549 m)    Wt 180 lb 1.9 oz (81.7 kg)  SpO2 96%    BMI 34.03 kg/m  Filed Weights   09/22/21 1710 09/23/21 0500 09/24/21 0500  Weight: 171 lb 15.3 oz (78 kg) 175 lb 7.8 oz (79.6 kg) 180 lb 1.9 oz (81.7 kg)    Estimated body mass index is 34.03 kg/m as calculated from the following:   Height as of this encounter: _0  (1.549 m).   Weight as of this encounter: 180 lb 1.9 oz (81.7 kg).  LABS: CBC:    Component Value Date/Time   WBC 21.5 (H) 09/24/2021 0531   HGB 8.7 (L) 09/24/2021 0531   HCT 24.9 (L) 09/24/2021 0531   PLT 321 09/24/2021 0531   MCV 93.3 09/24/2021 0531   NEUTROABS 24.2 (H) 09/21/2021 1400   LYMPHSABS 0.8 09/21/2021 1400   MONOABS 1.0 09/21/2021 1400   EOSABS 0.0 09/21/2021 1400   BASOSABS 0.0 09/21/2021 1400   Comprehensive Metabolic Panel:    Component Value Date/Time   NA 130 (L) 09/24/2021 0531   K 3.5 09/24/2021 0531   CL 98 09/24/2021 0531   CO2 25 09/24/2021 0531   BUN 28 (H) 09/24/2021 0531   CREATININE 1.51 (H) 09/24/2021 0531   GLUCOSE 100 (H) 09/24/2021 0531   CALCIUM 8.6 (L) 09/24/2021 0531   AST 39 09/21/2021 1400   ALT 16 09/21/2021 1400   ALKPHOS 127 (H) 09/21/2021 1400   BILITOT 0.9 09/21/2021 1400   PROT 5.8 (L) 09/21/2021 1400   ALBUMIN 2.5 (L) 09/22/2021 0750    RADIOGRAPHIC STUDIES: DG Chest 2 View  Result Date: 09/21/2021 CLINICAL DATA:  Vomiting and weakness.  History of ovarian cancer.  EXAM: CHEST - 2 VIEW COMPARISON:  Chest x-ray dated June 01, 2021. FINDINGS: Unchanged right chest wall port catheter. The heart size and mediastinal contours are within normal limits. Normal pulmonary vascularity. No focal consolidation, pleural effusion, or pneumothorax. No acute osseous abnormality. IMPRESSION: 1. No acute cardiopulmonary disease. Electronically Signed   By: Titus Dubin M.D.   On: 09/21/2021 11:10   DG Abd 1 View  Result Date: 09/22/2021 CLINICAL DATA:  Ovarian cancer with malignant ascites. Obstructive sleep apnea. Nausea and vomiting. EXAM: ABDOMEN - 1 VIEW COMPARISON:  09/07/2021 FINDINGS: Cholecystectomy clips noted. The bowel gas pattern is normal. No radio-opaque calculi or other significant radiographic abnormality are seen. IMPRESSION: Normal bowel gas pattern. Electronically Signed   By: Kerby Moors M.D.   On: 09/22/2021 18:12   DG Abd 1 View  Result Date: 09/07/2021 CLINICAL DATA:  Nausea, bloating EXAM: ABDOMEN - 1 VIEW COMPARISON:  09/02/2021 FINDINGS: The bowel gas pattern is normal. Cholecystectomy clips. Pelvic phleboliths. Minimal spurring in the lumbar spine. IMPRESSION: Normal bowel gas pattern.  No acute findings. Electronically Signed   By: Lucrezia Europe M.D.   On: 09/07/2021 10:39   DG Abd 1 View  Result Date: 09/02/2021 CLINICAL DATA:  61 year old female with history of nausea and vomiting. History of ovarian cancer. EXAM: ABDOMEN - 1 VIEW COMPARISON:  No priors. FINDINGS: The bowel gas pattern is normal. No radio-opaque calculi or other significant radiographic abnormality are seen. Surgical clips project over the right upper quadrant of the abdomen, likely from prior cholecystectomy. IMPRESSION: 1. Nonobstructive bowel gas pattern. 2. No pneumoperitoneum. Electronically Signed   By: Vinnie Langton M.D.   On: 09/02/2021 06:06   CT ABDOMEN PELVIS W CONTRAST  Result Date: 08/31/2021 CLINICAL DATA:  Abdominal pain, history of ovarian cancer EXAM:  CT ABDOMEN AND PELVIS WITH CONTRAST TECHNIQUE: Multidetector CT imaging of the  abdomen and pelvis was performed using the standard protocol following bolus administration of intravenous contrast. CONTRAST:  184m OMNIPAQUE IOHEXOL 300 MG/ML  SOLN COMPARISON:  CT abdomen and pelvis dated June 25, 2021 FINDINGS: Lower chest: No acute abnormality. Hepatobiliary: No focal liver abnormality is seen. Status post cholecystectomy. No biliary dilatation. In Pancreas: Unremarkable. No pancreatic ductal dilatation or surrounding inflammatory changes. Spleen: Normal in size without focal abnormality. Adrenals/Urinary Tract: Adrenal glands are unremarkable. Kidneys are normal, without renal calculi, focal lesion, or hydronephrosis. Bladder is unremarkable. Stomach/Bowel: Stomach is within normal limits. Appendix appears normal. No evidence of bowel wall thickening, distention, or inflammatory changes. Vascular/Lymphatic: Aortic atherosclerosis. No enlarged abdominal or pelvic lymph nodes. Reproductive: Uterus is surgically absent. Unchanged appearance of large cystic and solid mass of the right ovary, measures approximately 1.9 x 1.3 cm, when measured on coronal image, unchanged in size when remeasured in similar plane on prior exam. Other: Small fluid containing umbilical hernia. Similar peritoneal thickening and nodularity. Small to moderate volume abdominal ascites, slightly decreased when compared to the prior exam. Musculoskeletal: No acute or significant osseous findings. IMPRESSION: 1. No acute findings in the abdomen or pelvis. 2. Unchanged appearance of large cystic and solid mass of the right ovary. 3. Similar peritoneal thickening and nodularity. 4. Small to moderate volume abdominal ascites, slightly decreased when compared with prior exam. 5.  Aortic Atherosclerosis (ICD10-I70.0). Electronically Signed   By: LYetta GlassmanM.D.   On: 08/31/2021 12:24   [place]NAL  Result Date: 09/21/2021 CLINICAL DATA:  Acute  renal failure. EXAM: RENAL / URINARY TRACT ULTRASOUND COMPLETE COMPARISON:  None. FINDINGS: Right Kidney: Renal measurements: 9.7 cm x 4.9 cm x 4.5 cm = volume: 109.5 mL. Echogenicity within normal limits. No mass or hydronephrosis visualized. Left Kidney: Renal measurements: 9.9 cm x 4.5 cm x 4.3 cm = volume: 101.03 mL. Echogenicity within normal limits. No mass or hydronephrosis visualized. Bladder: Appears normal for degree of bladder distention. Other: None. IMPRESSION: Normal renal ultrasound. Electronically Signed   By: TVirgina NorfolkM.D.   On: 09/21/2021 22:33   [place]acentesis  Result Date: 09/21/2021 INDICATION: Patient with a history of ovarian cancer and recurrent large volume ascites presents today for therapeutic diagnostic paracentesis. EXAM: ULTRASOUND GUIDED PARACENTESIS MEDICATIONS: 1% lidocaine 10 mL COMPLICATIONS: None immediate. PROCEDURE: Informed written consent was obtained from the patient after a discussion of the risks, benefits and alternatives to treatment. A timeout was performed prior to the initiation of the procedure. Initial ultrasound scanning demonstrates a large amount of ascites within the right lower abdominal quadrant. The right lower abdomen was prepped and draped in the usual sterile fashion. 1% lidocaine was used for local anesthesia. Following this, a 19 gauge, 7-cm, Yueh catheter was introduced. An ultrasound image was saved for documentation purposes. The paracentesis was performed. The catheter was removed and a dressing was applied. The patient tolerated the procedure well without immediate post procedural complication. FINDINGS: A total of approximately 5 L of amber-colored fluid was removed. Samples were sent to the laboratory as requested by the clinical team. IMPRESSION: Successful ultrasound-guided paracentesis yielding 5 liters of peritoneal fluid. Read by: JSoyla Dryer NP Electronically Signed   By: GAletta EdouardM.D.   On: 09/21/2021 17:15   [place] Paracentesis  Result Date: 09/13/2021 INDICATION: ascites EXAM: ULTRASOUND GUIDED  PARACENTESIS MEDICATIONS: None. COMPLICATIONS: None immediate. PROCEDURE: Informed written consent was obtained from the patient after a discussion of the risks, benefits and alternatives to treatment. A timeout was  performed prior to the initiation of the procedure. Initial ultrasound scanning demonstrates a large amount of ascites within the right lower abdominal quadrant. The right lower abdomen was prepped and draped in the usual sterile fashion. 1% lidocaine was used for local anesthesia. Following this, a 19 gauge, 10-cm, Yueh catheter was introduced. An ultrasound image was saved for documentation purposes. The paracentesis was performed. The catheter was removed and a dressing was applied. The patient tolerated the procedure well without immediate post procedural complication. FINDINGS: A total of approximately 4.0 of dark amber fluid was removed. IMPRESSION: Successful ultrasound-guided paracentesis yielding approximately 4.0 liters of dark amber peritoneal fluid. Electronically Signed   By: Albin Felling M.D.   On: 09/13/2021 08:31   US Paracentesis  Result Date: 09/07/2021 INDICATION: History of ovarian cancer. Please perform ultrasound-guided paracentesis for therapeutic purposes. EXAM: ULTRASOUND-GUIDED PARACENTESIS COMPARISON:  Multiple previous ultrasound-guided paracenteses, most recently on 08/31/2021 yielding sphenoid cc of ascitic fluid MEDICATIONS: None. COMPLICATIONS: None immediate. TECHNIQUE: Informed written consent was obtained from the patient after a discussion of the risks, benefits and alternatives to treatment. A timeout was performed prior to the initiation of the procedure. Initial ultrasound scanning demonstrates a large amount of ascites within the right lower abdomen which was subsequently prepped and draped in the usual sterile fashion. 1% lidocaine with epinephrine was used for local  anesthesia. Under direct ultrasound guidance, a 19 gauge, 7-cm, Yueh catheter was introduced. An ultrasound image was saved for documentation purposed. The paracentesis was performed. The catheter was removed and a dressing was applied. The patient tolerated the procedure well without immediate post procedural complication. FINDINGS: A total of approximately 4.6 liters of blood-tinged serous fluid was removed. Samples were sent to the laboratory as requested by the clinical team. IMPRESSION: Successful ultrasound-guided paracentesis yielding 4.6 liters of peritoneal fluid. Electronically Signed   By: Sandi Mariscal M.D.   On: 09/07/2021 16:29   US Paracentesis  Result Date: 08/31/2021 INDICATION: Patient with history of ovarian cancer, recurrent ascites, new onset abdominal pain following paracentesis 08/29/2021. Request to IR for diagnostic and therapeutic paracentesis EXAM: ULTRASOUND GUIDED DIAGNOSTIC AND THERAPEUTIC PARACENTESIS MEDICATIONS: 8 mL 1% lidocaine COMPLICATIONS: None immediate. PROCEDURE: Informed written consent was obtained from the patient after a discussion of the risks, benefits and alternatives to treatment. A timeout was performed prior to the initiation of the procedure. Initial ultrasound scanning demonstrates a small amount of ascites within the right upper abdominal quadrant. The right upper abdomen was prepped and draped in the usual sterile fashion. 1% lidocaine was used for local anesthesia. Following this, a 19 gauge, 7-cm, Yueh catheter was introduced. An ultrasound image was saved for documentation purposes. The paracentesis was performed. The catheter was removed and a dressing was applied. The patient tolerated the procedure well without immediate post procedural complication. FINDINGS: A total of approximately 300 mL of clear, dark serosanguineous fluid was removed. Samples were sent to the laboratory as requested by the clinical team. IMPRESSION: Successful ultrasound-guided  paracentesis yielding 300 milliliters of peritoneal fluid. Read by Candiss Norse, PA-C Electronically Signed   By: Miachel Roux M.D.   On: 08/31/2021 14:48   US Paracentesis  Result Date: 08/29/2021 INDICATION: Patient with history of ovarian cancer recurrent ascites request for paracentesis. EXAM: ULTRASOUND GUIDED  PARACENTESIS MEDICATIONS: Local 1% lidocaine only. COMPLICATIONS: None immediate. PROCEDURE: Informed written consent was obtained from the patient after a discussion of the risks, benefits and alternatives to treatment. A timeout was performed prior to the initiation of  the procedure. Initial ultrasound scanning demonstrates a large amount of ascites within the left lower abdominal quadrant. The left lower abdomen was prepped and draped in the usual sterile fashion. 1% lidocaine was used for local anesthesia. Following this, a 19 gauge, 7-cm, Yueh catheter was introduced. An ultrasound image was saved for documentation purposes. The paracentesis was performed. The catheter was removed and a dressing was applied. The patient tolerated the procedure well without immediate post procedural complication. FINDINGS: A total of approximately 4 L of amber colored fluid was removed. IMPRESSION: Successful ultrasound-guided paracentesis yielding 4 liters of peritoneal fluid. This exam was performed by Tsosie Billing PA-C, and was supervised and interpreted by Dr. Vernard Gambles. Electronically Signed   By: Lucrezia Europe M.D.   On: 08/29/2021 16:23    PERFORMANCE STATUS (ECOG) : 1 - Symptomatic but completely ambulatory  Review of Systems Unless otherwise noted, a complete review of systems is negative.  Physical Exam General: NAD Pulmonary: Unlabored Extremities: no edema, no joint deformities Skin: no rashes Neurological: Weakness but otherwise nonfocal  IMPRESSION: I met with patient and her daughter.  Introduced palliative care services and attempted to establish therapeutic rapport.  Patient is  status post 6 cycles of carbo Taxol/Abraxane chemotherapy and plan was for debulking surgery in January at [facility].  However, patient has calculated high surgical risk and she is no longer felt to be a viable surgical candidate.  Dr. Tasia Catchings saw her on 09/21/2021 with plan to adjust her chemotherapy to include bevacizumab to hopefully reduce ascites recurrence.  Today, patient asked me candidly if she is nearing end-of-life.  She was tearful as she described tolerating chemotherapy poorly and says she has given some thought to if she wants to proceed versus focusing on quality of life.  She asked about having a family meeting when she is discharged from the hospital to further discuss goals.  This certainly can be arranged when patient returns for scheduled follow-up at the [facility]  Symptomatically, she says she feels better today.  She has had significant difficulty with intractable nausea and vomiting.  Symptoms seem to have improved on metoclopramide.  Patient reports tolerating food/drink and denies nausea or vomiting today.  Patient does feel like her ascites is reaccumulating.  I discussed in detail the option of a Tenckhoff catheter to allow for home management of ascites including risks (infection, leakage, etc).  Patient thinking about options.  PLAN: -Continue current scope of treatment -Consider indwelling peritoneal catheter to support home management of ascites -Agree with continued antiemetics/metoclopramide --We will plan follow-up/family meeting when patient returns to [place]   Time Total: 30 minutes  Visit consisted of counseling and education dealing with the complex and emotionally intense issues of symptom management and palliative care in the setting of serious and potentially life-threatening illness.Greater than 50%  of this time was spent counseling and coordinating care related to the above assessment and plan.  Signed by: Altha Harm, PhD, NP-C

## 2021-09-24 NOTE — Telephone Encounter (Signed)
Completed form- faxed ADA form to [facility] at [phone]. Msg patient that original forms can be picked up in cancer center. Waiting on patient's response.

## 2021-09-24 NOTE — Progress Notes (Signed)
PROGRESS NOTE    Brittany Montoya  URK:[id number] DOB: April 24, 1961 DOA: 09/21/2021 PCP: Adin Hector, MD  112A/112A-AA   Assessment & Plan:   Principal Problem:   Acute renal failure superimposed on stage 3a chronic kidney disease ([place]) Active Problems:   Malignant ascites   Ovarian cancer ([place])   Anemia   Hyponatremia   Nausea & vomiting   Hypoalbuminemia due to protein-calorie malnutrition (H[facility])   Hypomagnesemia   Hypercoagulable state ([place])   Hypercalcemia   Hyperphosphatemia   AKI (acute kidney injury) ([place])   Palliative care encounter   Brittany Montoya is a 61 y.o. female with medical history significant for ovarian cancer, malignant ascites (receives periodic paracentesis), obstructive sleep apnea (does not use CPAP), GERD, pulmonary embolism 04/12/2021 now on Eliquis, chronic kidney disease stage 3 (baseline Creatinine 1.5), anemia, arthritis, who presents from her oncologist office to the emergency department on 09/21/2021 with abnormal labs, vomiting, and abdominal pain.     Acute renal failure superimposed on stage 3a chronic kidney disease ([place]) Most likely related to dehydration from severe vomiting and poor oral intake.  Improved with IVF. Plan: --oral hydration   Nausea & vomiting, Severe.   Patient unable to keep anything down.  Pt said she hasn't tried Reglan at home, unlike what she told the admitter. --RN noted large vomit of 900 ml of bile evening of 1/7, suggesting some element of gastroparesis.  Plan: --cont IV Reglan (new) --advance to regular diet today  Recurrent Malignant ascites --Neutrophile counts always elevated on ascites fluid, however, cx had been neg.  SBP had been ruled out. --Receives paracentesis every week. --s/p paracentesis on admission with 5L removed Plan: --Consider placement of permanent drainage catheter due to recurrence of ascites, as outpatient    Ovarian cancer [facility]) Has been on chemotherapy most recently in  early December 2022.  Followed by oncologist, Dr. Tasia Catchings.   Plan:  --palliative consult today, continue current scope of treatment     Hypercoagulable state [facility]) Likely related to her malignancy.  PE on 04/12/2021. Plan:  --cont home Eliquis     Hyponatremia Mild.  Likely related to dehydration and poor oral intake.  Improved with IVF. --monitor Na --oral hydration     Anemia of chronic disease --1u pRBC for Hgb 6.9 --monitor Hgb and transfuse to keep Hgb >7    Hypoalbuminemia  --due to poor oral intake and frequent loss from paracentesis --supplements per dietitian     Hypomagnesemia --monitor and replete PRN     Abdominal pain, generalized --oxycodone PRN   DVT prophylaxis: GB:TDVVOHY Code Status: Full code  Family Communication: daughter updated at bedside today Level of care: Med-Surg Dispo:   The patient is from: home Anticipated d/c is to: home vs SNF Anticipated d/c date is: likely tomorrow  Patient currently is not medically ready to d/c due to: need to tolerate diet   Subjective and Interval History:  Pt asked to be advanced to regular diet today, and reported tolerating it.  Had BM x2 that were mostly solid.   Objective: Vitals:   09/24/21 0639 09/24/21 0731 09/24/21 1116 09/24/21 1612  BP: (!) 91/57 (!) 88/58 (!) 90/59 92/69  Pulse: 84 84 82 92  Resp: 16 20 20 16   Temp: 97.9 F (36.6 C) 97.9 F (36.6 C) 98.1 F (36.7 C) 98.3 F (36.8 C)  TempSrc: Oral Oral Oral Oral  SpO2: 97% 96% 99% 98%  Weight:      Height:  Intake/Output Summary (Last 24 hours) at 09/24/2021 1749 Last data filed at 09/24/2021 1300 Gross per 24 hour  Intake 600 ml  Output --  Net 600 ml   Filed Weights   09/22/21 1710 09/23/21 0500 09/24/21 0500  Weight: 78 kg 79.6 kg 81.7 kg    Examination:   Constitutional: NAD, AAOx3, sitting in recliner HEENT: conjunctivae and lids normal, EOMI CV: No cyanosis.   RESP: normal respiratory effort, on RA SKIN: warm,  dry Neuro: II - XII grossly intact.   Psych: Normal mood and affect.  Appropriate judgement and reason   Data Reviewed: I have personally reviewed following labs and imaging studies  CBC: Recent Labs  Lab 09/21/21 0849 09/21/21 1400 09/22/21 0750 09/23/21 0537 09/24/21 0531  WBC 28.2* 26.4* 20.7* 20.9* 21.5*  NEUTROABS 26.0* 24.2*  --   --   --   HGB 8.9* 8.8* 7.4* 6.9* 8.7*  HCT 26.3* 26.4* 22.0* 20.5* 24.9*  MCV 96.3 97.1 98.2 99.0 93.3  PLT 407* 391 340 307 841   Basic Metabolic Panel: Recent Labs  Lab 09/21/21 0849 09/21/21 1400 09/22/21 0750 09/23/21 0537 09/24/21 0531  NA 124* 127* 128* 130* 130*  K 3.8 4.0 3.6 3.5 3.5  CL 86* 90* 93* 96* 98  CO2 22 24 24 23 25   GLUCOSE 114* 91 78 64* 100*  BUN 43* 43* 39* 34* 28*  CREATININE 3.36* 3.30* 2.49* 2.14* 1.51*  CALCIUM 9.7 9.7 9.0 8.5* 8.6*  MG  --  1.5* 1.7 1.6* 1.8  PHOS  --  5.6* 5.0*  --   --    GFR: Estimated Creatinine Clearance: 37.9 mL/min (A) (by C-G formula based on SCr of 1.51 mg/dL (H)). Liver Function Tests: Recent Labs  Lab 09/21/21 0849 09/21/21 1400 09/22/21 0750  AST 37 39  --   ALT 15 16  --   ALKPHOS 136* 127*  --   BILITOT 0.6 0.9  --   PROT 6.2* 5.8*  --   ALBUMIN 2.4* 2.4* 2.5*   No results for input(s): LIPASE, AMYLASE in the last 168 hours. No results for input(s): AMMONIA in the last 168 hours. Coagulation Profile: No results for input(s): INR, PROTIME in the last 168 hours. Cardiac Enzymes: No results for input(s): CKTOTAL, CKMB, CKMBINDEX, TROPONINI in the last 168 hours. BNP (last 3 results) No results for input(s): PROBNP in the last 8760 hours. HbA1C: No results for input(s): HGBA1C in the last 72 hours. CBG: Recent Labs  Lab 09/23/21 0852 09/23/21 0942 09/23/21 1024 09/23/21 1956 09/24/21 1736  GLUCAP 59* 62* 70 108* 95   Lipid Profile: No results for input(s): CHOL, HDL, LDLCALC, TRIG, CHOLHDL, LDLDIRECT in the last 72 hours. Thyroid Function Tests: No  results for input(s): TSH, T4TOTAL, FREET4, T3FREE, THYROIDAB in the last 72 hours. Anemia Panel: No results for input(s): VITAMINB12, FOLATE, FERRITIN, TIBC, IRON, RETICCTPCT in the last 72 hours. Sepsis Labs: No results for input(s): PROCALCITON, LATICACIDVEN in the last 168 hours.  Recent Results (from the past 240 hour(s))  Resp Panel by RT-PCR (Flu A&B, Covid) Nasopharyngeal Swab     Status: None   Collection Time: 09/21/21  2:03 PM   Specimen: Nasopharyngeal Swab; Nasopharyngeal(NP) swabs in vial transport medium  Result Value Ref Range Status   SARS Coronavirus 2 by RT PCR NEGATIVE NEGATIVE Final    Comment: (NOTE) SARS-CoV-2 target nucleic acids are NOT DETECTED.  The SARS-CoV-2 RNA is generally detectable in upper respiratory specimens during the acute phase of infection.  The lowest concentration of SARS-CoV-2 viral copies this assay can detect is 138 copies/mL. A negative result does not preclude SARS-Cov-2 infection and should not be used as the sole basis for treatment or other patient management decisions. A negative result may occur with  improper specimen collection/handling, submission of specimen other than nasopharyngeal swab, presence of viral mutation(s) within the areas targeted by this assay, and inadequate number of viral copies(<138 copies/mL). A negative result must be combined with clinical observations, patient history, and epidemiological information. The expected result is Negative.  Fact Sheet for Patients:  EntrepreneurPulse.com.au  Fact Sheet for Healthcare Providers:  IncredibleEmployment.be  This test is no t yet approved or cleared by the [place] FDA and  has been authorized for detection and/or diagnosis of SARS-CoV-2 by FDA under an Emergency Use Authorization (EUA). This EUA will remain  in effect (meaning this test can be used) for the duration of the COVID-19 declaration under Section 564(b)(1) of  the Act, 21 U.S.C.section 360bbb-3(b)(1), unless the authorization is terminated  or revoked sooner.       Influenza A by PCR NEGATIVE NEGATIVE Final   Influenza B by PCR NEGATIVE NEGATIVE Final    Comment: (NOTE) The Xpert Xpress SARS-CoV-2/FLU/RSV plus assay is intended as an aid in the diagnosis of influenza from Nasopharyngeal swab specimens and should not be used as a sole basis for treatment. Nasal washings and aspirates are unacceptable for Xpert Xpress SARS-CoV-2/FLU/RSV testing.  Fact Sheet for Patients: EntrepreneurPulse.com.au  Fact Sheet for Healthcare Providers: IncredibleEmployment.be  This test is not yet approved or cleared by the [place] FDA and has been authorized for detection and/or diagnosis of SARS-CoV-2 by FDA under an Emergency Use Authorization (EUA). This EUA will remain in effect (meaning this test can be used) for the duration of the COVID-19 declaration under Section 564(b)(1) of the Act, 21 U.S.C. section 360bbb-3(b)(1), unless the authorization is terminated or revoked.  Performed at [facility], [address], [place], [place] [address]       Radiology Studies: No results found.   Scheduled Meds:  (feeding supplement) PROSource Plus  30 mL Oral TID BM   acetaminophen  1,000 mg Oral QHS   apixaban  5 mg Oral BID   Chlorhexidine Gluconate Cloth  6 each Topical Daily   clonazePAM  0.5 mg Oral BID   feeding supplement  1 Container Oral TID BM   metoCLOPramide (REGLAN) injection  10 mg Intravenous Q8H   multivitamin with minerals  1 tablet Oral Daily   Continuous Infusions:     LOS: 1 day     Enzo Bi, MD [facility] If 7PM-7AM, please contact night-coverage 09/24/2021, 5:49 PM

## 2021-09-24 NOTE — Progress Notes (Signed)
Physical Therapy Treatment Patient Details Name: Brittany Montoya MRN: [id number] DOB: 1960/10/06 Today's Date: 09/24/2021   History of Present Illness Pt is a 61 y.o. female with medical history significant for  ovarian cancer on chemotherapy, liver cirrhosis with ascites, hypertension, GERD, depression with anxiety, thrombocytopenia, and PE on Eliquis who presented to the ED after being called by Dr. Tasia Catchings for lab abnormalities including hyponatremia, hypomagnesemia, and worsening renal function. Pt s/p paracentesis of R abdomen that yielded 5L of fluid.    PT Comments    Pt was pleasant and motivated to participate during the session and put forth good effort throughout. Pt required min A with bed mobility tasks but no assist with tranfers or ambulation.  Pt was able to amb a max of 15' this session before fatiguing and needing to return to sitting.  Pt reported no adverse symptoms during the session and is slowly making progress towards goals.  Pt will benefit from PT services in a SNF setting upon discharge to safely address deficits listed in patient problem list for decreased caregiver assistance and eventual return to PLOF.     Recommendations for follow up therapy are one component of a multi-disciplinary discharge planning process, led by the attending physician.  Recommendations may be updated based on patient status, additional functional criteria and insurance authorization.  Follow Up Recommendations  Skilled nursing-short term rehab (<3 hours/day)     Assistance Recommended at Discharge Frequent or constant Supervision/Assistance  Patient can return home with the following A lot of help with walking and/or transfers;Two people to help with bathing/dressing/bathroom;Help with stairs or ramp for entrance;Assist for transportation;Assistance with cooking/housework   Equipment Recommendations  None recommended by PT    Recommendations for Other Services       Precautions /  Restrictions Precautions Precautions: Fall Restrictions Weight Bearing Restrictions: No     Mobility  Bed Mobility Overal bed mobility: Needs Assistance       Supine to sit: Min assist     General bed mobility comments: Min A for BLE and trunk control    Transfers Overall transfer level: Needs assistance Equipment used: Rolling walker (2 wheels) Transfers: Sit to/from Stand Sit to Stand: Min guard;From elevated surface           General transfer comment: Fair control or stability    Ambulation/Gait Ambulation/Gait assistance: Min guard Gait Distance (Feet): 15 Feet Assistive device: Rolling walker (2 wheels) Gait Pattern/deviations: Step-through pattern;Decreased step length - right;Decreased step length - left;Trunk flexed Gait velocity: decreased     General Gait Details: Slow cadence with mod verbal cues for amb closer to the RW with upright posture   Stairs             Wheelchair Mobility    Modified Rankin (Stroke Patients Only)       Balance Overall balance assessment: Needs assistance Sitting-balance support: Bilateral upper extremity supported;Feet unsupported Sitting balance-Leahy Scale: Good     Standing balance support: Bilateral upper extremity supported;During functional activity Standing balance-Leahy Scale: Fair                              Cognition Arousal/Alertness: Awake/alert Behavior During Therapy: WFL for tasks assessed/performed Overall Cognitive Status: Within Functional Limits for tasks assessed  Exercises Total Joint Exercises Ankle Circles/Pumps: AROM;Strengthening;Both;10 reps (with manual resistance) Quad Sets: Strengthening;Both;10 reps Gluteal Sets: Strengthening;Both;10 reps Long Arc Quad: Strengthening;Both;10 reps Knee Flexion: 10 reps;Both;Strengthening Marching in Standing: AROM;Strengthening;Both;5 reps;Standing Other  Exercises Other Exercises: HEP education and review for BLE APs, QS, GS, and LAQs x 10 each every 1-2 hours daily    General Comments        Pertinent Vitals/Pain Pain Assessment: No/denies pain    Home Living                          Prior Function            PT Goals (current goals can now be found in the care plan section) Progress towards PT goals: Progressing toward goals    Frequency    Min 2X/week      PT Plan Current plan remains appropriate    Co-evaluation              AM-PAC PT "6 Clicks" Mobility   Outcome Measure  Help needed turning from your back to your side while in a flat bed without using bedrails?: A Little Help needed moving from lying on your back to sitting on the side of a flat bed without using bedrails?: A Little Help needed moving to and from a bed to a chair (including a wheelchair)?: A Little Help needed standing up from a chair using your arms (e.g., wheelchair or bedside chair)?: A Little Help needed to walk in hospital room?: A Little Help needed climbing 3-5 steps with a railing? : Total 6 Click Score: 16    End of Session Equipment Utilized During Treatment: Gait belt Activity Tolerance: Patient tolerated treatment well Patient left: in chair;with call bell/phone within reach;with chair alarm set;with family/visitor present;with SCD's reapplied Nurse Communication: Mobility status PT Visit Diagnosis: History of falling (Z91.81);Unsteadiness on feet (R26.81);Difficulty in walking, not elsewhere classified (R26.2);Muscle weakness (generalized) (M62.81)     Time: 4696-2952 PT Time Calculation (min) (ACUTE ONLY): 27 min  Charges:  $Gait Training: 8-22 mins $Therapeutic Exercise: 8-22 mins                     D. Scott Anamari Galeas PT, DPT 09/24/21, 3:52 PM

## 2021-09-25 LAB — BASIC METABOLIC PANEL
Anion gap: 8 (ref 5–15)
BUN: 25 mg/dL — ABNORMAL HIGH (ref 8–23)
CO2: 24 mmol/L (ref 22–32)
Calcium: 8.4 mg/dL — ABNORMAL LOW (ref 8.9–10.3)
Chloride: 97 mmol/L — ABNORMAL LOW (ref 98–111)
Creatinine, Ser: 1.18 mg/dL — ABNORMAL HIGH (ref 0.44–1.00)
GFR, Estimated: 53 mL/min — ABNORMAL LOW (ref >60)
Glucose, Bld: 95 mg/dL (ref 70–99)
Potassium: 3.3 mmol/L — ABNORMAL LOW (ref 3.5–5.1)
Sodium: 129 mmol/L — ABNORMAL LOW (ref 135–145)

## 2021-09-25 LAB — CBC
HCT: 27.1 % — ABNORMAL LOW (ref 36.0–46.0)
Hemoglobin: 9.3 g/dL — ABNORMAL LOW (ref 12.0–15.0)
MCH: 32.6 pg (ref 26.0–34.0)
MCHC: 34.3 g/dL (ref 30.0–36.0)
MCV: 95.1 fL (ref 80.0–100.0)
Platelets: 350 10*3/uL (ref 150–400)
RBC: 2.85 MIL/uL — ABNORMAL LOW (ref 3.87–5.11)
RDW: 19.5 % — ABNORMAL HIGH (ref 11.5–15.5)
WBC: 17.8 10*3/uL — ABNORMAL HIGH (ref 4.0–10.5)
nRBC: 0 % (ref 0.0–0.2)

## 2021-09-25 LAB — MAGNESIUM: Magnesium: 1.5 mg/dL — ABNORMAL LOW (ref 1.7–2.4)

## 2021-09-25 LAB — CYTOLOGY - NON PAP

## 2021-09-25 MED ORDER — HEPARIN SOD (PORK) LOCK FLUSH 100 UNIT/ML IV SOLN
500.0000 [IU] | Freq: Once | INTRAVENOUS | Status: AC
  Administered 2021-09-25: 500 [IU] via INTRAVENOUS
  Filled 2021-09-25: qty 5

## 2021-09-25 MED ORDER — MAGNESIUM SULFATE 2 GM/50ML IV SOLN
2.0000 g | Freq: Once | INTRAVENOUS | Status: AC
  Administered 2021-09-25: 2 g via INTRAVENOUS
  Filled 2021-09-25: qty 50

## 2021-09-25 MED ORDER — METOCLOPRAMIDE HCL 10 MG PO TABS: 10.0000 mg | ORAL_TABLET | Freq: Three times a day (TID) | ORAL | 0 refills | Status: AC

## 2021-09-25 MED ORDER — LIDOCAINE-PRILOCAINE 2.5-2.5 % EX CREA: 1.0000 "application " | TOPICAL_CREAM | Freq: Every day | CUTANEOUS | Status: AC | PRN

## 2021-09-25 MED ORDER — POTASSIUM CHLORIDE CRYS ER 20 MEQ PO TBCR
40.0000 meq | EXTENDED_RELEASE_TABLET | Freq: Once | ORAL | Status: AC
Start: 2021-09-25 — End: 2021-09-25
  Administered 2021-09-25: 40 meq via ORAL
  Filled 2021-09-25: qty 2

## 2021-09-25 NOTE — Progress Notes (Signed)
Patient being discharged to home, plan to call and schedule echocardiogram as outpatient. Port de-accessed and discharge education provided, all questions answered.

## 2021-09-25 NOTE — Discharge Summary (Addendum)
Physician Discharge Summary   Brittany Montoya  female DOB: July 25, 1961  MPN:[id number]  PCP: Adin Hector, MD  Admit date: 09/21/2021 Discharge date: 09/25/2021  Admitted From: home Disposition:  home Home Health: Yes CODE STATUS: Full code   Hospital Course:  For full details, please see H&P, progress notes, consult notes and ancillary notes.  Briefly,  Brittany Montoya is a 61 y.o. female with medical history significant for ovarian cancer, malignant ascites (receives periodic paracentesis), obstructive sleep apnea (does not use CPAP), pulmonary embolism 04/12/2021 now on Eliquis, chronic kidney disease stage 3 (baseline Creatinine 1.5), anemia, arthritis, who presented from her oncologist office to the emergency department on 09/21/2021 with abnormal labs, vomiting, and abdominal pain.     Acute renal failure superimposed on stage 3a chronic kidney disease ([place]) Most likely related to dehydration from severe vomiting and poor oral intake.  Improved with IVF.   Nausea & vomiting, Severe.   Patient unable to keep anything down.  Pt had a Rx for Reglan at home but wasn't able to take it consistently. --RN noted large vomit of 900 ml of bile evening of 1/7, suggesting some element of gastroparesis.  --Pt was started on scheduled IV Reglan which seemed to have helped, and pt was tolerating regular diet prior to discharge. --Pt was discharged on oral Reglan 10 mg TID for 2 weeks, to be continued by outpatient provider if needed.   Recurrent Malignant ascites --Neutrophile counts always elevated on ascites fluid, however, cx had been neg.  SBP had been ruled out. --Receives paracentesis every week. --s/p paracentesis on admission with 5L removed --Pt to consider placement of permanent drainage catheter due to recurrence of ascites, as outpatient     Ovarian cancer [facility]) Has been on chemotherapy most recently in early December 2022.  Followed by oncologist, Dr. Tasia Catchings.    --palliative consulted, continue current scope of treatment     Hypercoagulable state [facility]) Likely related to her malignancy.  PE on 04/12/2021. --cont home Eliquis     Hyponatremia Mild.  Likely related to dehydration and poor oral intake.  Improved with IVF.     Anemia of chronic disease --received 1u pRBC for Hgb 6.9     Hypoalbuminemia  --due to poor oral intake and frequent loss from paracentesis --supplements per dietitian     Hypomagnesemia --monitored and repleted PRN     Abdominal pain, generalized --home oxycodone PRN   Discharge Diagnoses:  Principal Problem:   Acute renal failure superimposed on stage 3a chronic kidney disease (HCC) Active Problems:   Malignant ascites   Ovarian cancer (HCC)   Anemia   Hyponatremia   Nausea & vomiting   Hypoalbuminemia    Hypomagnesemia   Hypercoagulable state ([place])   Hypercalcemia   Hyperphosphatemia   AKI (acute kidney injury) ([place])   Palliative care encounter   30 Day Unplanned Readmission Risk Score    Flowsheet Row ED to Hosp-Admission (Current) from 09/21/2021 in [place] (1C)  30 Day Unplanned Readmission Risk Score (%) 31.42 Filed at 09/25/2021 0801       This score is the patient's risk of an unplanned readmission within 30 days of being discharged (0 -100%). The score is based on dignosis, age, lab data, medications, orders, and past utilization.   Low:  0-14.9   Medium: 15-21.9   High: 22-29.9   Extreme: 30 and above         Discharge Instructions:  Allergies as of  09/25/2021       Reactions   Paclitaxel Shortness Of Breath, Other (See Comments)   pt flush, stated she didn't feel well, taxol stopped (05/10/2021)   Amlodipine Other (See Comments), Rash   Not effective Not effective   Penicillins Rash        Medication List     STOP taking these medications    DULoxetine 30 MG capsule Commonly known as: Cymbalta   hydrocortisone butyrate 0.1 % Crea  cream Commonly known as: LUCOID   multivitamin with minerals Tabs tablet   nystatin 100000 UNIT/ML suspension Commonly known as: MYCOSTATIN   ondansetron 8 MG tablet Commonly known as: Zofran   potassium chloride SA 20 MEQ tablet Commonly known as: KLOR-CON M   promethazine 25 MG suppository Commonly known as: PHENERGAN       TAKE these medications    acetaminophen 500 MG tablet Commonly known as: TYLENOL Take 1,000 mg by mouth at bedtime.   albuterol 108 (90 Base) MCG/ACT inhaler Commonly known as: VENTOLIN HFA Inhale 1-2 puffs into the lungs every 6 (six) hours as needed for wheezing or shortness of breath.   clonazePAM 0.5 MG tablet Commonly known as: KLONOPIN Take 1 tablet (0.5 mg total) by mouth 2 (two) times daily.   Eliquis 5 MG Tabs tablet Generic drug: apixaban Take 1 tablet (5 mg total) by mouth 2 (two) times daily.   feeding supplement Liqd Take 237 mLs by mouth 2 (two) times daily between meals.   lidocaine-prilocaine cream Commonly known as: EMLA Apply 1 application topically daily as needed (pain). Home med What changed:  how much to take how to take this when to take this reasons to take this additional instructions   metoCLOPramide 10 MG tablet Commonly known as: REGLAN Take 1 tablet (10 mg total) by mouth in the morning, at noon, and at bedtime for 14 days.   oxyCODONE 5 MG immediate release tablet Commonly known as: Oxy IR/ROXICODONE Take 1 tablet (5 mg total) by mouth every 6 (six) hours as needed for severe pain.   prochlorperazine 10 MG tablet Commonly known as: COMPAZINE Take 1 tablet (10 mg total) by mouth every 6 (six) hours as needed (Nausea or vomiting).         Follow-up Information     Tama High III, MD Follow up in 1 week(s).   Specialty: Internal Medicine Contact information: [place] [address] [phone]         Earlie Server, MD Follow up.   Specialty:  Oncology Contact information: [place] [address] [phone]                 Allergies  Allergen Reactions   Paclitaxel Shortness Of Breath and Other (See Comments)    pt flush, stated she didn't feel well, taxol stopped (05/10/2021)   Amlodipine Other (See Comments) and Rash    Not effective Not effective    Penicillins Rash     The results of significant diagnostics from this hospitalization (including imaging, microbiology, ancillary and laboratory) are listed below for reference.   Consultations:   Procedures/Studies: DG Chest 2 View  Result Date: 09/21/2021 CLINICAL DATA:  Vomiting and weakness.  History of ovarian cancer. EXAM: CHEST - 2 VIEW COMPARISON:  Chest x-ray dated June 01, 2021. FINDINGS: Unchanged right chest wall port catheter. The heart size and mediastinal contours are within normal limits. Normal pulmonary vascularity. No focal consolidation, pleural effusion, or  pneumothorax. No acute osseous abnormality. IMPRESSION: 1. No acute cardiopulmonary disease. Electronically Signed   By: Titus Dubin M.D.   On: 09/21/2021 11:10   DG Abd 1 View  Result Date: 09/22/2021 CLINICAL DATA:  Ovarian cancer with malignant ascites. Obstructive sleep apnea. Nausea and vomiting. EXAM: ABDOMEN - 1 VIEW COMPARISON:  09/07/2021 FINDINGS: Cholecystectomy clips noted. The bowel gas pattern is normal. No radio-opaque calculi or other significant radiographic abnormality are seen. IMPRESSION: Normal bowel gas pattern. Electronically Signed   By: Kerby Moors M.D.   On: 09/22/2021 18:12   DG Abd 1 View  Result Date: 09/07/2021 CLINICAL DATA:  Nausea, bloating EXAM: ABDOMEN - 1 VIEW COMPARISON:  09/02/2021 FINDINGS: The bowel gas pattern is normal. Cholecystectomy clips. Pelvic phleboliths. Minimal spurring in the lumbar spine. IMPRESSION: Normal bowel gas pattern.  No acute findings. Electronically Signed   By: Lucrezia Europe M.D.   On: 09/07/2021  10:39   DG Abd 1 View  Result Date: 09/02/2021 CLINICAL DATA:  61 year old female with history of nausea and vomiting. History of ovarian cancer. EXAM: ABDOMEN - 1 VIEW COMPARISON:  No priors. FINDINGS: The bowel gas pattern is normal. No radio-opaque calculi or other significant radiographic abnormality are seen. Surgical clips project over the right upper quadrant of the abdomen, likely from prior cholecystectomy. IMPRESSION: 1. Nonobstructive bowel gas pattern. 2. No pneumoperitoneum. Electronically Signed   By: Vinnie Langton M.D.   On: 09/02/2021 06:06   CT ABDOMEN PELVIS W CONTRAST  Result Date: 08/31/2021 CLINICAL DATA:  Abdominal pain, history of ovarian cancer EXAM: CT ABDOMEN AND PELVIS WITH CONTRAST TECHNIQUE: Multidetector CT imaging of the abdomen and pelvis was performed using the standard protocol following bolus administration of intravenous contrast. CONTRAST:  160mL OMNIPAQUE IOHEXOL 300 MG/ML  SOLN COMPARISON:  CT abdomen and pelvis dated June 25, 2021 FINDINGS: Lower chest: No acute abnormality. Hepatobiliary: No focal liver abnormality is seen. Status post cholecystectomy. No biliary dilatation. In Pancreas: Unremarkable. No pancreatic ductal dilatation or surrounding inflammatory changes. Spleen: Normal in size without focal abnormality. Adrenals/Urinary Tract: Adrenal glands are unremarkable. Kidneys are normal, without renal calculi, focal lesion, or hydronephrosis. Bladder is unremarkable. Stomach/Bowel: Stomach is within normal limits. Appendix appears normal. No evidence of bowel wall thickening, distention, or inflammatory changes. Vascular/Lymphatic: Aortic atherosclerosis. No enlarged abdominal or pelvic lymph nodes. Reproductive: Uterus is surgically absent. Unchanged appearance of large cystic and solid mass of the right ovary, measures approximately 1.9 x 1.3 cm, when measured on coronal image, unchanged in size when remeasured in similar plane on prior exam. Other:  Small fluid containing umbilical hernia. Similar peritoneal thickening and nodularity. Small to moderate volume abdominal ascites, slightly decreased when compared to the prior exam. Musculoskeletal: No acute or significant osseous findings. IMPRESSION: 1. No acute findings in the abdomen or pelvis. 2. Unchanged appearance of large cystic and solid mass of the right ovary. 3. Similar peritoneal thickening and nodularity. 4. Small to moderate volume abdominal ascites, slightly decreased when compared with prior exam. 5.  Aortic Atherosclerosis (ICD10-I70.0). Electronically Signed   By: Yetta Glassman M.D.   On: 08/31/2021 12:24   US RENAL  Result Date: 09/21/2021 CLINICAL DATA:  Acute renal failure. EXAM: RENAL / URINARY TRACT ULTRASOUND COMPLETE COMPARISON:  None. FINDINGS: Right Kidney: Renal measurements: 9.7 cm x 4.9 cm x 4.5 cm = volume: 109.5 mL. Echogenicity within normal limits. No mass or hydronephrosis visualized. Left Kidney: Renal measurements: 9.9 cm x 4.5 cm x 4.3 cm = volume: 101.03  mL. Echogenicity within normal limits. No mass or hydronephrosis visualized. Bladder: Appears normal for degree of bladder distention. Other: None. IMPRESSION: Normal renal ultrasound. Electronically Signed   By: Virgina Norfolk M.D.   On: 09/21/2021 22:33   US Paracentesis  Result Date: 09/21/2021 INDICATION: Patient with a history of ovarian cancer and recurrent large volume ascites presents today for therapeutic diagnostic paracentesis. EXAM: ULTRASOUND GUIDED PARACENTESIS MEDICATIONS: 1% lidocaine 10 mL COMPLICATIONS: None immediate. PROCEDURE: Informed written consent was obtained from the patient after a discussion of the risks, benefits and alternatives to treatment. A timeout was performed prior to the initiation of the procedure. Initial ultrasound scanning demonstrates a large amount of ascites within the right lower abdominal quadrant. The right lower abdomen was prepped and draped in the usual sterile  fashion. 1% lidocaine was used for local anesthesia. Following this, a 19 gauge, 7-cm, Yueh catheter was introduced. An ultrasound image was saved for documentation purposes. The paracentesis was performed. The catheter was removed and a dressing was applied. The patient tolerated the procedure well without immediate post procedural complication. FINDINGS: A total of approximately 5 L of amber-colored fluid was removed. Samples were sent to the laboratory as requested by the clinical team. IMPRESSION: Successful ultrasound-guided paracentesis yielding 5 liters of peritoneal fluid. Read by: Soyla Dryer, NP Electronically Signed   By: Aletta Edouard M.D.   On: 09/21/2021 17:15   US Paracentesis  Result Date: 09/13/2021 INDICATION: ascites EXAM: ULTRASOUND GUIDED  PARACENTESIS MEDICATIONS: None. COMPLICATIONS: None immediate. PROCEDURE: Informed written consent was obtained from the patient after a discussion of the risks, benefits and alternatives to treatment. A timeout was performed prior to the initiation of the procedure. Initial ultrasound scanning demonstrates a large amount of ascites within the right lower abdominal quadrant. The right lower abdomen was prepped and draped in the usual sterile fashion. 1% lidocaine was used for local anesthesia. Following this, a 19 gauge, 10-cm, Yueh catheter was introduced. An ultrasound image was saved for documentation purposes. The paracentesis was performed. The catheter was removed and a dressing was applied. The patient tolerated the procedure well without immediate post procedural complication. FINDINGS: A total of approximately 4.0 of dark amber fluid was removed. IMPRESSION: Successful ultrasound-guided paracentesis yielding approximately 4.0 liters of dark amber peritoneal fluid. Electronically Signed   By: Albin Felling M.D.   On: 09/13/2021 08:31   US Paracentesis  Result Date: 09/07/2021 INDICATION: History of ovarian cancer. Please perform  ultrasound-guided paracentesis for therapeutic purposes. EXAM: ULTRASOUND-GUIDED PARACENTESIS COMPARISON:  Multiple previous ultrasound-guided paracenteses, most recently on 08/31/2021 yielding sphenoid cc of ascitic fluid MEDICATIONS: None. COMPLICATIONS: None immediate. TECHNIQUE: Informed written consent was obtained from the patient after a discussion of the risks, benefits and alternatives to treatment. A timeout was performed prior to the initiation of the procedure. Initial ultrasound scanning demonstrates a large amount of ascites within the right lower abdomen which was subsequently prepped and draped in the usual sterile fashion. 1% lidocaine with epinephrine was used for local anesthesia. Under direct ultrasound guidance, a 19 gauge, 7-cm, Yueh catheter was introduced. An ultrasound image was saved for documentation purposed. The paracentesis was performed. The catheter was removed and a dressing was applied. The patient tolerated the procedure well without immediate post procedural complication. FINDINGS: A total of approximately 4.6 liters of blood-tinged serous fluid was removed. Samples were sent to the laboratory as requested by the clinical team. IMPRESSION: Successful ultrasound-guided paracentesis yielding 4.6 liters of peritoneal fluid. Electronically Signed   By:  Sandi Mariscal M.D.   On: 09/07/2021 16:29   US Paracentesis  Result Date: 08/31/2021 INDICATION: Patient with history of ovarian cancer, recurrent ascites, new onset abdominal pain following paracentesis 08/29/2021. Request to IR for diagnostic and therapeutic paracentesis EXAM: ULTRASOUND GUIDED DIAGNOSTIC AND THERAPEUTIC PARACENTESIS MEDICATIONS: 8 mL 1% lidocaine COMPLICATIONS: None immediate. PROCEDURE: Informed written consent was obtained from the patient after a discussion of the risks, benefits and alternatives to treatment. A timeout was performed prior to the initiation of the procedure. Initial ultrasound scanning  demonstrates a small amount of ascites within the right upper abdominal quadrant. The right upper abdomen was prepped and draped in the usual sterile fashion. 1% lidocaine was used for local anesthesia. Following this, a 19 gauge, 7-cm, Yueh catheter was introduced. An ultrasound image was saved for documentation purposes. The paracentesis was performed. The catheter was removed and a dressing was applied. The patient tolerated the procedure well without immediate post procedural complication. FINDINGS: A total of approximately 300 mL of clear, dark serosanguineous fluid was removed. Samples were sent to the laboratory as requested by the clinical team. IMPRESSION: Successful ultrasound-guided paracentesis yielding 300 milliliters of peritoneal fluid. Read by Candiss Norse, PA-C Electronically Signed   By: Miachel Roux M.D.   On: 08/31/2021 14:48   US Paracentesis  Result Date: 08/29/2021 INDICATION: Patient with history of ovarian cancer recurrent ascites request for paracentesis. EXAM: ULTRASOUND GUIDED  PARACENTESIS MEDICATIONS: Local 1% lidocaine only. COMPLICATIONS: None immediate. PROCEDURE: Informed written consent was obtained from the patient after a discussion of the risks, benefits and alternatives to treatment. A timeout was performed prior to the initiation of the procedure. Initial ultrasound scanning demonstrates a large amount of ascites within the left lower abdominal quadrant. The left lower abdomen was prepped and draped in the usual sterile fashion. 1% lidocaine was used for local anesthesia. Following this, a 19 gauge, 7-cm, Yueh catheter was introduced. An ultrasound image was saved for documentation purposes. The paracentesis was performed. The catheter was removed and a dressing was applied. The patient tolerated the procedure well without immediate post procedural complication. FINDINGS: A total of approximately 4 L of amber colored fluid was removed. IMPRESSION: Successful  ultrasound-guided paracentesis yielding 4 liters of peritoneal fluid. This exam was performed by Tsosie Billing PA-C, and was supervised and interpreted by Dr. Vernard Gambles. Electronically Signed   By: Lucrezia Europe M.D.   On: 08/29/2021 16:23      Labs: BNP (last 3 results) No results for input(s): BNP in the last 8760 hours. Basic Metabolic Panel: Recent Labs  Lab 09/21/21 1400 09/22/21 0750 09/23/21 0537 09/24/21 0531 09/25/21 0537  NA 127* 128* 130* 130* 129*  K 4.0 3.6 3.5 3.5 3.3*  CL 90* 93* 96* 98 97*  CO2 24 24 23 25 24   GLUCOSE 91 78 64* 100* 95  BUN 43* 39* 34* 28* 25*  CREATININE 3.30* 2.49* 2.14* 1.51* 1.18*  CALCIUM 9.7 9.0 8.5* 8.6* 8.4*  MG 1.5* 1.7 1.6* 1.8 1.5*  PHOS 5.6* 5.0*  --   --   --    Liver Function Tests: Recent Labs  Lab 09/21/21 0849 09/21/21 1400 09/22/21 0750  AST 37 39  --   ALT 15 16  --   ALKPHOS 136* 127*  --   BILITOT 0.6 0.9  --   PROT 6.2* 5.8*  --   ALBUMIN 2.4* 2.4* 2.5*   No results for input(s): LIPASE, AMYLASE in the last 168 hours. No results for input(s):  AMMONIA in the last 168 hours. CBC: Recent Labs  Lab 09/21/21 0849 09/21/21 1400 09/22/21 0750 09/23/21 0537 09/24/21 0531 09/25/21 0537  WBC 28.2* 26.4* 20.7* 20.9* 21.5* 17.8*  NEUTROABS 26.0* 24.2*  --   --   --   --   HGB 8.9* 8.8* 7.4* 6.9* 8.7* 9.3*  HCT 26.3* 26.4* 22.0* 20.5* 24.9* 27.1*  MCV 96.3 97.1 98.2 99.0 93.3 95.1  PLT 407* 391 340 307 321 350   Cardiac Enzymes: No results for input(s): CKTOTAL, CKMB, CKMBINDEX, TROPONINI in the last 168 hours. BNP: Invalid input(s): POCBNP CBG: Recent Labs  Lab 09/23/21 0942 09/23/21 1024 09/23/21 1956 09/24/21 1736 09/24/21 2224  GLUCAP 62* 70 108* 95 104*   D-Dimer No results for input(s): DDIMER in the last 72 hours. Hgb A1c No results for input(s): HGBA1C in the last 72 hours. Lipid Profile No results for input(s): CHOL, HDL, LDLCALC, TRIG, CHOLHDL, LDLDIRECT in the last 72 hours. Thyroid function  studies No results for input(s): TSH, T4TOTAL, T3FREE, THYROIDAB in the last 72 hours.  Invalid input(s): FREET3 Anemia work up No results for input(s): VITAMINB12, FOLATE, FERRITIN, TIBC, IRON, RETICCTPCT in the last 72 hours. Urinalysis    Component Value Date/Time   COLORURINE YELLOW (A) 09/22/2021 2132   APPEARANCEUR HAZY (A) 09/22/2021 2132   LABSPEC 1.017 09/22/2021 2132   PHURINE 5.0 09/22/2021 2132   GLUCOSEU NEGATIVE 09/22/2021 2132   HGBUR NEGATIVE 09/22/2021 2132   BILIRUBINUR NEGATIVE 09/22/2021 2132   KETONESUR 5 (A) 09/22/2021 2132   PROTEINUR NEGATIVE 09/22/2021 2132   NITRITE NEGATIVE 09/22/2021 2132   LEUKOCYTESUR NEGATIVE 09/22/2021 2132   Sepsis Labs Invalid input(s): PROCALCITONIN,  WBC,  LACTICIDVEN Microbiology Recent Results (from the past 240 hour(s))  Resp Panel by RT-PCR (Flu A&B, Covid) Nasopharyngeal Swab     Status: None   Collection Time: 09/21/21  2:03 PM   Specimen: Nasopharyngeal Swab; Nasopharyngeal(NP) swabs in vial transport medium  Result Value Ref Range Status   SARS Coronavirus 2 by RT PCR NEGATIVE NEGATIVE Final    Comment: (NOTE) SARS-CoV-2 target nucleic acids are NOT DETECTED.  The SARS-CoV-2 RNA is generally detectable in upper respiratory specimens during the acute phase of infection. The lowest concentration of SARS-CoV-2 viral copies this assay can detect is 138 copies/mL. A negative result does not preclude SARS-Cov-2 infection and should not be used as the sole basis for treatment or other patient management decisions. A negative result may occur with  improper specimen collection/handling, submission of specimen other than nasopharyngeal swab, presence of viral mutation(s) within the areas targeted by this assay, and inadequate number of viral copies(<138 copies/mL). A negative result must be combined with clinical observations, patient history, and epidemiological information. The expected result is Negative.  Fact Sheet  for Patients:  EntrepreneurPulse.com.au  Fact Sheet for Healthcare Providers:  IncredibleEmployment.be  This test is no t yet approved or cleared by the [place] FDA and  has been authorized for detection and/or diagnosis of SARS-CoV-2 by FDA under an Emergency Use Authorization (EUA). This EUA will remain  in effect (meaning this test can be used) for the duration of the COVID-19 declaration under Section 564(b)(1) of the Act, 21 U.S.C.section 360bbb-3(b)(1), unless the authorization is terminated  or revoked sooner.       Influenza A by PCR NEGATIVE NEGATIVE Final   Influenza B by PCR NEGATIVE NEGATIVE Final    Comment: (NOTE) The Xpert Xpress SARS-CoV-2/FLU/RSV plus assay is intended as an aid in the diagnosis  of influenza from Nasopharyngeal swab specimens and should not be used as a sole basis for treatment. Nasal washings and aspirates are unacceptable for Xpert Xpress SARS-CoV-2/FLU/RSV testing.  Fact Sheet for Patients: EntrepreneurPulse.com.au  Fact Sheet for Healthcare Providers: IncredibleEmployment.be  This test is not yet approved or cleared by the [place] FDA and has been authorized for detection and/or diagnosis of SARS-CoV-2 by FDA under an Emergency Use Authorization (EUA). This EUA will remain in effect (meaning this test can be used) for the duration of the COVID-19 declaration under Section 564(b)(1) of the Act, 21 U.S.C. section 360bbb-3(b)(1), unless the authorization is terminated or revoked.  Performed at [facility], [address] [place], [place] [address]      Total time spend on discharging this patient, including the last patient exam, discussing the hospital stay, instructions for ongoing care as it relates to all pertinent caregivers, as well as preparing the medical discharge records, prescriptions, and/or referrals as applicable, is 35  minutes.    Enzo Bi, MD  [facility] 09/25/2021, 11:50 AM

## 2021-09-25 NOTE — Progress Notes (Signed)
Occupational Therapy Treatment Patient Details Name: Brittany Montoya MRN: [id number] DOB: 1961/02/28 Today's Date: 09/25/2021   History of present illness Pt is a 61 y.o. female with medical history significant for  ovarian cancer on chemotherapy, liver cirrhosis with ascites, hypertension, GERD, depression with anxiety, thrombocytopenia, and PE on Eliquis who presented to the ED after being called by Dr. Tasia Catchings for lab abnormalities including hyponatremia, hypomagnesemia, and worsening renal function. Pt s/p paracentesis of R abdomen that yielded 5L of fluid.   OT comments  Brittany Montoya reports feeling considerably better than when seen for OT 3 days previously. Her nausea is reduced, she has had only 1 episode of vomiting in previous 24 hours, and is steadier in standing, with improved sitting and standing balance, and with reduced LE edema. She requires no physical assist with supine<>sit, transfers, and grooming; however tires quickly with standing and requests completing grooming tasks sitting EOB. Pt's family present and continue to endorse ability to meet pt's needs at home. Recommend DC to home with [place].   Recommendations for follow up therapy are one component of a multi-disciplinary discharge planning process, led by the attending physician.  Recommendations may be updated based on patient status, additional functional criteria and insurance authorization.    Follow Up Recommendations  Home health OT    Assistance Recommended at Discharge    Patient can return home with the following  A little help with walking and/or transfers;A little help with bathing/dressing/bathroom;Assistance with cooking/housework;Assist for transportation;Help with stairs or ramp for entrance   Equipment Recommendations  Hospital bed    Recommendations for Other Services      Precautions / Restrictions Precautions Precautions: Fall Precaution Comments: mod fall risk Restrictions Weight Bearing  Restrictions: No       Mobility Bed Mobility Overal bed mobility: Needs Assistance Bed Mobility: Supine to Sit;Sit to Supine     Supine to sit: Supervision Sit to supine: Supervision   General bed mobility comments: Pt able to raise BLE up to bed level today    Transfers Overall transfer level: Needs assistance Equipment used: Rolling walker (2 wheels) Transfers: Sit to/from Stand Sit to Stand: Supervision                 Balance Overall balance assessment: Needs assistance Sitting-balance support: Feet supported;Single extremity supported;Bilateral upper extremity supported Sitting balance-Leahy Scale: Good     Standing balance support: Bilateral upper extremity supported;During functional activity;Single extremity supported Standing balance-Leahy Scale: Fair Standing balance comment: fatigues quickly                           ADL either performed or assessed with clinical judgement   ADL Overall ADL's : Needs assistance/impaired     Grooming: Wash/dry hands;Wash/dry face;Oral care;Brushing hair;Sitting;Supervision/safety Grooming Details (indicate cue type and reason): attempted in standing, but quickly had to shift to sitting EOB                                    Extremity/Trunk Assessment Upper Extremity Assessment Upper Extremity Assessment: Generalized weakness   Lower Extremity Assessment Lower Extremity Assessment: Generalized weakness        Vision Patient Visual Report: No change from baseline     Perception     Praxis      Cognition Arousal/Alertness: Awake/alert Behavior During Therapy: WFL for tasks assessed/performed Overall Cognitive Status: Within Functional Limits  for tasks assessed                                            Exercises Other Exercises Other Exercises: Educ re UE therex, bed mobility, importance of repositioning to prevent skin breakdown, energy conservation  strategies, signs of dehydration   Shoulder Instructions       General Comments      Pertinent Vitals/ Pain       Faces Pain Scale: Hurts a little bit Pain Location: back Pain Descriptors / Indicators: Aching Pain Intervention(s): Repositioned;Relaxation;Limited activity within patient's tolerance  Home Living                                          Prior Functioning/Environment              Frequency  Min 2X/week        Progress Toward Goals  OT Goals(current goals can now be found in the care plan section)  Progress towards OT goals: Progressing toward goals  Acute Rehab OT Goals Patient Stated Goal: to go home OT Goal Formulation: With patient Time For Goal Achievement: 10/06/21 Potential to Achieve Goals: Good  Plan Discharge plan remains appropriate;Frequency remains appropriate    Co-evaluation                 AM-PAC OT "6 Clicks" Daily Activity     Outcome Measure   Help from another person eating meals?: None Help from another person taking care of personal grooming?: A Little Help from another person toileting, which includes using toliet, bedpan, or urinal?: A Little Help from another person bathing (including washing, rinsing, drying)?: A Little Help from another person to put on and taking off regular upper body clothing?: A Little Help from another person to put on and taking off regular lower body clothing?: A Lot 6 Click Score: 18    End of Session Equipment Utilized During Treatment: Rolling walker (2 wheels)  OT Visit Diagnosis: Unsteadiness on feet (R26.81);Muscle weakness (generalized) (M62.81)   Activity Tolerance Patient limited by fatigue   Patient Left in bed;with family/visitor present;with call bell/phone within reach;with bed alarm set   Nurse Communication          Time: 1029-1050 OT Time Calculation (min): 21 min  Charges: OT General Charges $OT Visit: 1 Visit OT Treatments $Self  Care/Home Management : 8-22 mins  Josiah Lobo, PhD, MS, OTR/L 09/25/21, 1:22 PM

## 2021-09-25 NOTE — Telephone Encounter (Signed)
Per patient, her daughter will come pick up original forms today. Forms placed in envelope and given to [place], RN to take downstairs to front reception.

## 2021-09-25 NOTE — TOC Initial Note (Addendum)
Transition of Care [facility]) - Initial/Assessment Note    Patient Details  Name: Brittany Montoya MRN: [id number] Date of Birth: 1961-07-25  Transition of [facility]) CM/SW Contact:    Pete Pelt, RN Phone Number: 09/25/2021, 10:23 AM  Clinical Narrative:    Patient lives at home with spouse, uses walker at home currently.  Daughter assists patient when spouse is at work.  Patient has no transportation concerns, and no concerns with medication regimen adherence.  Patient currently uses walker at home.  SNF recommended, however patient refuses SNF at this time.  Patient would like home health, is aware of a high deductible with her insurance plan.  If unable to secure home health patient still desires discharge to home.              Patient sees Dr. Caryl Comes as her PCP and is up to date with her visits.  Patient is also seen at the cancer center and is up to date with visits to cancer center.    Patient verbalizes feeling safe and comfortable returning home at discharge.  TOC has notified home health agencies and is awaiting response.  Addendum Tomasita Crumble accepted [facility] services for patient.  Expected Discharge Plan: [place]     Patient Goals and CMS Choice Patient states their goals for this hospitalization and ongoing recovery are:: To return home at discharge.   Choice offered to / list presented to : NA  Expected Discharge Plan and Services Expected Discharge Plan: [place]   Discharge Planning Services: CM Consult   Living arrangements for the past 2 months: Single Family Home                                      Prior Living Arrangements/Services Living arrangements for the past 2 months: Single Family Home Lives with:: Spouse, Self Patient language and need for interpreter reviewed:: Yes (no interpreter required) Do you feel safe going back to the place where you live?: Yes      Need for Family Participation in Patient Care:  Yes (Comment) Care giver support system in place?: Yes (comment) Current home services: DME Criminal Activity/Legal Involvement Pertinent to Current Situation/Hospitalization: No - Comment as needed  Activities of Daily Living Home Assistive Devices/Equipment: Walker (specify type) ADL Screening (condition at time of admission) Patient's cognitive ability adequate to safely complete daily activities?: Yes Is the patient deaf or have difficulty hearing?: No Does the patient have difficulty seeing, even when wearing glasses/contacts?: No Does the patient have difficulty concentrating, remembering, or making decisions?: No Patient able to express need for assistance with ADLs?: Yes Does the patient have difficulty dressing or bathing?: No Independently performs ADLs?: Yes (appropriate for developmental age) Does the patient have difficulty walking or climbing stairs?: Yes Weakness of Legs: Both Weakness of Arms/Hands: Both  Permission Sought/Granted Permission sought to share information with : Case Manager Permission granted to share information with : Yes, Verbal Permission Granted     Permission granted to share info w AGENCY: Prospective home health agency        Emotional Assessment Appearance:: Appears stated age Attitude/Demeanor/Rapport: Gracious, Engaged Affect (typically observed): Pleasant, Appropriate Orientation: : Oriented to Self, Oriented to  Time, Oriented to Situation Alcohol / Substance Use: Not Applicable Psych Involvement: No (comment)  Admission diagnosis:  Ascitic fluid [R18.8] Hyponatremia [E87.1] Acute renal failure (ARF) ([place]) [N17.9] Acute  renal failure superimposed on chronic kidney disease, unspecified CKD stage, unspecified acute renal failure type ([place]) [N17.9, N18.9] AKI (acute kidney injury) ([place]) [N17.9] Patient Active Problem List   Diagnosis Date Noted   Palliative care encounter    AKI (acute kidney injury) ([place]) 09/23/2021    Hypomagnesemia 09/21/2021   Hypercoagulable state ([place]) 09/21/2021   Hypercalcemia 09/21/2021   Hyperphosphatemia 09/21/2021   Acute renal failure superimposed on stage 3a chronic kidney disease ([place]) 09/07/2021   Abdominal distension 09/07/2021   Hypoalbuminemia due to protein-calorie malnutrition ([place]) 09/07/2021   Hypokalemia 09/07/2021   Transaminitis 09/07/2021   Obesity (BMI 30-39.9) 09/07/2021   Leukocytosis 09/07/2021   Nausea & vomiting    Abdominal pain 08/31/2021   Anemia 08/31/2021   Thrombocytopenia ([place]) 08/31/2021   Sepsis ([place]) 08/31/2021   Hyponatremia 08/31/2021   SBP (spontaneous bacterial peritonitis) ([place]) 08/31/2021   Genetic testing 07/10/2021   Macrocytic anemia 07/05/2021   Chemotherapy induced neutropenia ([place]) 06/15/2021   Constipation, chronic 05/17/2021   Carcinoma of ovary, stage 3, right ([place]) 05/17/2021   Portal venous hypertension ([place]) 05/17/2021   Infusion reaction 05/10/2021   Family history of breast cancer 04/27/2021   Goals of care, counseling/discussion 04/19/2021   Pulmonary embolus ([place]) 04/19/2021   Encounter for antineoplastic chemotherapy 04/19/2021   Depression with anxiety 04/19/2021   Ovarian cancer ([place]) 04/15/2021   Malignant ascites 04/11/2021   Mass of right ovary 03/26/2021   DDD (degenerative disc disease), cervical 03/02/2019   Glaucoma (increased eye pressure) 03/02/2019   Venous stasis 03/02/2019   Other hyperlipidemia 01/21/2019   Facet arthritis of cervical region 04/01/2016   Incomplete tear of left rotator cuff 01/15/2016   Rotator cuff tendinitis, left 01/15/2016   Essential hypertension 10/02/2015   Obstructive sleep apnea syndrome 10/02/2015   Recurrent major depressive disorder, in full remission ([place]) 10/02/2015   Contusion of left knee 10/17/2014   PCP:  Adin Hector, MD Pharmacy:   [facility], [address] [place] [address] Phone: [phone] Fax:  [phone]     Social Determinants of Health (SDOH) Interventions    Readmission Risk Interventions No flowsheet data found.

## 2021-09-26 ENCOUNTER — Telehealth: Payer: Self-pay

## 2021-09-26 DIAGNOSIS — R180 Malignant ascites: Secondary | ICD-10-CM

## 2021-09-26 NOTE — Telephone Encounter (Signed)
-----   Message from [place] sent at 09/26/2021 11:17 AM EST ----- Regarding: Discharge Patient discharged from hospital 1/10.  Does she need follow up appointments scheduled?  Thanks, [employer]

## 2021-09-26 NOTE — Telephone Encounter (Signed)
Spoke to pt's daughter, Jinny Blossom, and informed her of all appt.

## 2021-09-26 NOTE — Telephone Encounter (Signed)
Pt will be discharged from hospital today. Please reschedule echo asap and schedule lab/MD next week on a day that's not too busy. Please inform her of appts.   I will reach out to specials scheduling to set up paracentesis and then i'll reach out to her with those dates.

## 2021-09-28 ENCOUNTER — Other Ambulatory Visit: Payer: Self-pay | Admitting: Oncology

## 2021-09-28 ENCOUNTER — Ambulatory Visit
Admission: RE | Admit: 2021-09-28 | Discharge: 2021-09-28 | Disposition: A | Discharge status: Home / Self Care | Payer: BC Managed Care – PPO | Source: Ambulatory Visit | Attending: Oncology | Admitting: Oncology

## 2021-09-28 ENCOUNTER — Other Ambulatory Visit: Payer: Self-pay

## 2021-09-28 DIAGNOSIS — C569 Malignant neoplasm of unspecified ovary: Secondary | ICD-10-CM | POA: Diagnosis present

## 2021-09-28 DIAGNOSIS — R180 Malignant ascites: Secondary | ICD-10-CM | POA: Diagnosis present

## 2021-09-28 NOTE — Procedures (Signed)
PROCEDURE SUMMARY:  Successful US guided paracentesis from left lateral abdomen.  Yielded 4 liters of clear yellow fluid.  No immediate complications.  Patient tolerated well.  EBL = trace  Paublo Warshawsky S Taniqua Issa PA-C 09/28/2021 3:13 PM

## 2021-09-29 ENCOUNTER — Encounter: Payer: Self-pay | Admitting: Oncology

## 2021-10-03 ENCOUNTER — Encounter: Payer: Self-pay | Admitting: Oncology

## 2021-10-03 ENCOUNTER — Other Ambulatory Visit: Payer: Self-pay

## 2021-10-03 ENCOUNTER — Inpatient Hospital Stay: Payer: BC Managed Care – PPO

## 2021-10-03 ENCOUNTER — Inpatient Hospital Stay
Admission: EM | Admit: 2021-10-03 | Discharge: 2021-10-06 | DRG: 375 | Disposition: A | Discharge status: Hospice | Payer: BC Managed Care – PPO | Source: Ambulatory Visit | Attending: Student in an Organized Health Care Education/Training Program | Admitting: Student in an Organized Health Care Education/Training Program

## 2021-10-03 ENCOUNTER — Encounter: Payer: Self-pay | Admitting: Internal Medicine

## 2021-10-03 ENCOUNTER — Inpatient Hospital Stay (HOSPITAL_BASED_OUTPATIENT_CLINIC_OR_DEPARTMENT_OTHER): Payer: BC Managed Care – PPO | Admitting: Oncology

## 2021-10-03 VITALS — BP 71/48 | HR 122 | Temp 98.0°F

## 2021-10-03 DIAGNOSIS — G4733 Obstructive sleep apnea (adult) (pediatric): Secondary | ICD-10-CM | POA: Diagnosis not present

## 2021-10-03 DIAGNOSIS — R140 Abdominal distension (gaseous): Secondary | ICD-10-CM | POA: Diagnosis not present

## 2021-10-03 DIAGNOSIS — R180 Malignant ascites: Secondary | ICD-10-CM | POA: Diagnosis not present

## 2021-10-03 DIAGNOSIS — Z20822 Contact with and (suspected) exposure to covid-19: Secondary | ICD-10-CM | POA: Diagnosis present

## 2021-10-03 DIAGNOSIS — E871 Hypo-osmolality and hyponatremia: Secondary | ICD-10-CM | POA: Diagnosis not present

## 2021-10-03 DIAGNOSIS — I2699 Other pulmonary embolism without acute cor pulmonale: Secondary | ICD-10-CM

## 2021-10-03 DIAGNOSIS — R112 Nausea with vomiting, unspecified: Secondary | ICD-10-CM

## 2021-10-03 DIAGNOSIS — R531 Weakness: Secondary | ICD-10-CM

## 2021-10-03 DIAGNOSIS — Z7189 Other specified counseling: Secondary | ICD-10-CM

## 2021-10-03 DIAGNOSIS — D649 Anemia, unspecified: Secondary | ICD-10-CM

## 2021-10-03 DIAGNOSIS — Z86711 Personal history of pulmonary embolism: Secondary | ICD-10-CM | POA: Insufficient documentation

## 2021-10-03 DIAGNOSIS — M910 Juvenile osteochondrosis of pelvis: Secondary | ICD-10-CM | POA: Diagnosis not present

## 2021-10-03 DIAGNOSIS — Z7901 Long term (current) use of anticoagulants: Secondary | ICD-10-CM | POA: Diagnosis not present

## 2021-10-03 DIAGNOSIS — C561 Malignant neoplasm of right ovary: Secondary | ICD-10-CM | POA: Insufficient documentation

## 2021-10-03 DIAGNOSIS — E861 Hypovolemia: Secondary | ICD-10-CM | POA: Diagnosis present

## 2021-10-03 DIAGNOSIS — I129 Hypertensive chronic kidney disease with stage 1 through stage 4 chronic kidney disease, or unspecified chronic kidney disease: Secondary | ICD-10-CM | POA: Diagnosis present

## 2021-10-03 DIAGNOSIS — G893 Neoplasm related pain (acute) (chronic): Secondary | ICD-10-CM

## 2021-10-03 DIAGNOSIS — C786 Secondary malignant neoplasm of retroperitoneum and peritoneum: Principal | ICD-10-CM | POA: Diagnosis present

## 2021-10-03 DIAGNOSIS — E46 Unspecified protein-calorie malnutrition: Secondary | ICD-10-CM

## 2021-10-03 DIAGNOSIS — Z66 Do not resuscitate: Secondary | ICD-10-CM | POA: Diagnosis not present

## 2021-10-03 DIAGNOSIS — K56609 Unspecified intestinal obstruction, unspecified as to partial versus complete obstruction: Secondary | ICD-10-CM | POA: Diagnosis present

## 2021-10-03 DIAGNOSIS — E8809 Other disorders of plasma-protein metabolism, not elsewhere classified: Secondary | ICD-10-CM | POA: Diagnosis not present

## 2021-10-03 DIAGNOSIS — I10 Essential (primary) hypertension: Secondary | ICD-10-CM

## 2021-10-03 DIAGNOSIS — Z5181 Encounter for therapeutic drug level monitoring: Secondary | ICD-10-CM

## 2021-10-03 DIAGNOSIS — C569 Malignant neoplasm of unspecified ovary: Secondary | ICD-10-CM

## 2021-10-03 DIAGNOSIS — K5900 Constipation, unspecified: Secondary | ICD-10-CM | POA: Insufficient documentation

## 2021-10-03 DIAGNOSIS — I959 Hypotension, unspecified: Secondary | ICD-10-CM | POA: Insufficient documentation

## 2021-10-03 DIAGNOSIS — Z803 Family history of malignant neoplasm of breast: Secondary | ICD-10-CM

## 2021-10-03 DIAGNOSIS — N179 Acute kidney failure, unspecified: Secondary | ICD-10-CM | POA: Diagnosis not present

## 2021-10-03 DIAGNOSIS — R600 Localized edema: Secondary | ICD-10-CM | POA: Insufficient documentation

## 2021-10-03 DIAGNOSIS — A419 Sepsis, unspecified organism: Secondary | ICD-10-CM | POA: Diagnosis present

## 2021-10-03 DIAGNOSIS — Z9221 Personal history of antineoplastic chemotherapy: Secondary | ICD-10-CM

## 2021-10-03 DIAGNOSIS — Z515 Encounter for palliative care: Secondary | ICD-10-CM

## 2021-10-03 DIAGNOSIS — K7460 Unspecified cirrhosis of liver: Secondary | ICD-10-CM | POA: Diagnosis not present

## 2021-10-03 DIAGNOSIS — H409 Unspecified glaucoma: Secondary | ICD-10-CM | POA: Diagnosis present

## 2021-10-03 DIAGNOSIS — N1831 Chronic kidney disease, stage 3a: Secondary | ICD-10-CM

## 2021-10-03 DIAGNOSIS — E860 Dehydration: Secondary | ICD-10-CM | POA: Diagnosis present

## 2021-10-03 LAB — CBC WITH DIFFERENTIAL/PLATELET
Abs Immature Granulocytes: 0.14 10*3/uL — ABNORMAL HIGH (ref 0.00–0.07)
Basophils Absolute: 0.1 10*3/uL (ref 0.0–0.1)
Basophils Relative: 0 %
Eosinophils Absolute: 0 10*3/uL (ref 0.0–0.5)
Eosinophils Relative: 0 %
HCT: 32.7 % — ABNORMAL LOW (ref 36.0–46.0)
Hemoglobin: 10.9 g/dL — ABNORMAL LOW (ref 12.0–15.0)
Immature Granulocytes: 1 %
Lymphocytes Relative: 2 %
Lymphs Abs: 0.6 10*3/uL — ABNORMAL LOW (ref 0.7–4.0)
MCH: 31.5 pg (ref 26.0–34.0)
MCHC: 33.3 g/dL (ref 30.0–36.0)
MCV: 94.5 fL (ref 80.0–100.0)
Monocytes Absolute: 0.7 10*3/uL (ref 0.1–1.0)
Monocytes Relative: 3 %
Neutro Abs: 24 10*3/uL — ABNORMAL HIGH (ref 1.7–7.7)
Neutrophils Relative %: 94 %
Platelets: 411 10*3/uL — ABNORMAL HIGH (ref 150–400)
RBC: 3.46 MIL/uL — ABNORMAL LOW (ref 3.87–5.11)
RDW: 17.6 % — ABNORMAL HIGH (ref 11.5–15.5)
WBC: 25.5 10*3/uL — ABNORMAL HIGH (ref 4.0–10.5)
nRBC: 0 % (ref 0.0–0.2)

## 2021-10-03 LAB — COMPREHENSIVE METABOLIC PANEL
ALT: 21 U/L (ref 0–44)
AST: 45 U/L — ABNORMAL HIGH (ref 15–41)
Albumin: 2 g/dL — ABNORMAL LOW (ref 3.5–5.0)
Alkaline Phosphatase: 178 U/L — ABNORMAL HIGH (ref 38–126)
Anion gap: 14 (ref 5–15)
BUN: 38 mg/dL — ABNORMAL HIGH (ref 8–23)
CO2: 25 mmol/L (ref 22–32)
Calcium: 9.5 mg/dL (ref 8.9–10.3)
Chloride: 85 mmol/L — ABNORMAL LOW (ref 98–111)
Creatinine, Ser: 2.04 mg/dL — ABNORMAL HIGH (ref 0.44–1.00)
GFR, Estimated: 27 mL/min — ABNORMAL LOW (ref >60)
Glucose, Bld: 166 mg/dL — ABNORMAL HIGH (ref 70–99)
Potassium: 4.5 mmol/L (ref 3.5–5.1)
Sodium: 124 mmol/L — ABNORMAL LOW (ref 135–145)
Total Bilirubin: 0.6 mg/dL (ref 0.3–1.2)
Total Protein: 6.1 g/dL — ABNORMAL LOW (ref 6.5–8.1)

## 2021-10-03 LAB — TROPONIN I (HIGH SENSITIVITY)
Troponin I (High Sensitivity): 22 ng/L — ABNORMAL HIGH (ref <18)
Troponin I (High Sensitivity): 25 ng/L — ABNORMAL HIGH (ref <18)

## 2021-10-03 LAB — GLUCOSE, CAPILLARY: Glucose-Capillary: 105 mg/dL — ABNORMAL HIGH (ref 70–99)

## 2021-10-03 LAB — CBC
HCT: 33.2 % — ABNORMAL LOW (ref 36.0–46.0)
Hemoglobin: 10.9 g/dL — ABNORMAL LOW (ref 12.0–15.0)
MCH: 31.3 pg (ref 26.0–34.0)
MCHC: 32.8 g/dL (ref 30.0–36.0)
MCV: 95.4 fL (ref 80.0–100.0)
Platelets: 419 10*3/uL — ABNORMAL HIGH (ref 150–400)
RBC: 3.48 MIL/uL — ABNORMAL LOW (ref 3.87–5.11)
RDW: 17.6 % — ABNORMAL HIGH (ref 11.5–15.5)
WBC: 28 10*3/uL — ABNORMAL HIGH (ref 4.0–10.5)
nRBC: 0 % (ref 0.0–0.2)

## 2021-10-03 LAB — BASIC METABOLIC PANEL
Anion gap: 13 (ref 5–15)
BUN: 40 mg/dL — ABNORMAL HIGH (ref 8–23)
CO2: 25 mmol/L (ref 22–32)
Calcium: 9.9 mg/dL (ref 8.9–10.3)
Chloride: 85 mmol/L — ABNORMAL LOW (ref 98–111)
Creatinine, Ser: 2.15 mg/dL — ABNORMAL HIGH (ref 0.44–1.00)
GFR, Estimated: 26 mL/min — ABNORMAL LOW (ref >60)
Glucose, Bld: 159 mg/dL — ABNORMAL HIGH (ref 70–99)
Potassium: 4.8 mmol/L (ref 3.5–5.1)
Sodium: 123 mmol/L — ABNORMAL LOW (ref 135–145)

## 2021-10-03 LAB — RESP PANEL BY RT-PCR (FLU A&B, COVID) ARPGX2
Influenza A by PCR: NEGATIVE
Influenza B by PCR: NEGATIVE
SARS Coronavirus 2 by RT PCR: NEGATIVE

## 2021-10-03 LAB — APTT: aPTT: 41 seconds — ABNORMAL HIGH (ref 24–36)

## 2021-10-03 LAB — BRAIN NATRIURETIC PEPTIDE: B Natriuretic Peptide: 318.6 pg/mL — ABNORMAL HIGH (ref 0.0–100.0)

## 2021-10-03 LAB — PROTIME-INR
INR: 1.6 — ABNORMAL HIGH (ref 0.8–1.2)
Prothrombin Time: 18.6 seconds — ABNORMAL HIGH (ref 11.4–15.2)

## 2021-10-03 LAB — LACTIC ACID, PLASMA: Lactic Acid, Venous: 1.7 mmol/L (ref 0.5–1.9)

## 2021-10-03 LAB — MAGNESIUM: Magnesium: 1.5 mg/dL — ABNORMAL LOW (ref 1.7–2.4)

## 2021-10-03 MED ORDER — PANTOPRAZOLE SODIUM 40 MG IV SOLR
40.0000 mg | Freq: Two times a day (BID) | INTRAVENOUS | Status: DC
  Administered 2021-10-03 – 2021-10-06 (×6): 40 mg via INTRAVENOUS
  Filled 2021-10-03 (×6): qty 40

## 2021-10-03 MED ORDER — APIXABAN 2.5 MG PO TABS
2.5000 mg | ORAL_TABLET | Freq: Two times a day (BID) | ORAL | Status: DC
  Administered 2021-10-03: 22:00:00 2.5 mg via ORAL
  Filled 2021-10-03 (×2): qty 1

## 2021-10-03 MED ORDER — ONDANSETRON HCL 4 MG/2ML IJ SOLN
4.0000 mg | Freq: Four times a day (QID) | INTRAMUSCULAR | Status: DC | PRN
  Administered 2021-10-03 – 2021-10-05 (×2): 4 mg via INTRAVENOUS
  Filled 2021-10-03 (×2): qty 2

## 2021-10-03 MED ORDER — SODIUM CHLORIDE 0.9% FLUSH
3.0000 mL | Freq: Two times a day (BID) | INTRAVENOUS | Status: DC
  Administered 2021-10-03 – 2021-10-06 (×5): 3 mL via INTRAVENOUS

## 2021-10-03 MED ORDER — SODIUM CHLORIDE 0.9 % IV SOLN: Freq: Once | INTRAVENOUS | Status: AC

## 2021-10-03 MED ORDER — SODIUM CHLORIDE 0.9 % IV BOLUS
500.0000 mL | Freq: Once | INTRAVENOUS | Status: AC
  Administered 2021-10-03: 500 mL via INTRAVENOUS

## 2021-10-03 MED ORDER — HEPARIN SOD (PORK) LOCK FLUSH 100 UNIT/ML IV SOLN
500.0000 [IU] | Freq: Once | INTRAVENOUS | Status: DC
  Filled 2021-10-03: qty 5

## 2021-10-03 MED ORDER — SODIUM CHLORIDE 0.9% FLUSH
10.0000 mL | Freq: Once | INTRAVENOUS | Status: AC
  Administered 2021-10-03: 10 mL via INTRAVENOUS
  Filled 2021-10-03: qty 10

## 2021-10-03 NOTE — ED Notes (Signed)
Pt attempted to use room toilet and bedside commode w/o success.  Pt aware we need a sample.

## 2021-10-03 NOTE — ED Notes (Signed)
Pt notified of PT-INR add on.

## 2021-10-03 NOTE — Progress Notes (Signed)
Pt's husband taking patient to the ED for hypotension, tachycardia and intractable N/V. Port is accessed in Right chest with bio patch applied.

## 2021-10-03 NOTE — ED Provider Notes (Signed)
[facility] Provider Note    Event Date/Time   First MD Initiated Contact with Patient 10/03/21 1655     (approximate)  History   Chief Complaint: Nausea vomiting weakness  HPI  Shelise Maron is a 61 y.o. female with a past medical history of ovarian cancer last chemotherapy beginning of December who presents to the emergency department for worsening ascites nausea vomiting and weakness.  Patient states she has been feeling very weak for several weeks, until follow-up appointment with Dr.Yu today was found to be hypotensive and sent to the emergency department.  Lab work performed at oncology shows acute kidney injury.  Patient has been nauseated with frequent episodes of vomiting.  Patient states abdominal distention and discomfort secondary to ascites which has become fairly typical for the patient.  States she is undergoing weekly paracentesis at this point.  Physical Exam   Triage Vital Signs: ED Triage Vitals  Enc Vitals Group     BP 10/03/21 1559 (!) 89/70     Pulse Rate 10/03/21 1559 (!) 127     Resp 10/03/21 1559 19     Temp 10/03/21 1559 98.4 F (36.9 C)     Temp Source 10/03/21 1559 Oral     SpO2 10/03/21 1559 91 %     Weight --      Height --      Head Circumference --      Peak Flow --      Pain Score 10/03/21 1601 6     Pain Loc --      Pain Edu? --      Excl. in [place]? --     Most recent vital signs: Vitals:   10/03/21 1559  BP: (!) 89/70  Pulse: (!) 127  Resp: 19  Temp: 98.4 F (36.9 C)  SpO2: 91%    General: Awake, no distress.  CV:  Good peripheral perfusion.  Regular rate and rhythm  Resp:  Normal effort.  Equal breath sounds bilaterally.  Abd:  Mild diffuse tenderness, patient states this is normal.  Moderate distention consistent with ascites  ED Results / Procedures / Treatments   EKG  EKG viewed and interpreted by myself shows sinus tachycardia 121 bpm with a narrow QRS, normal axis, normal intervals,  nonspecific ST changes.  MEDICATIONS ORDERED IN ED: Medications  0.9 %  sodium chloride infusion (has no administration in time range)     IMPRESSION / MDM / ASSESSMENT AND PLAN / ED COURSE  I reviewed the triage vital signs and the nursing notes.  Patient presents to the emergency department referred from oncology for hypotension.  Here patient found to have acute renal insufficiency with creatinine currently 2.1.  Patient is also hyponatremic baseline sodium around 130 currently 123.  Patient also has moderate leukocytosis with a white blood cell count of 28,000.  This is largely unchanged from historical values looking at the patient's old labs.  I reviewed the patient's oncology note from today.  We will admit to the hospitalist service for further work-up treatment hopefully would be able to arrange for paracentesis or possibly a drain placed.  Reassuringly patient is afebrile.  We will check COVID, urinalysis.  Suspect the patient's symptoms are due to acute kidney injury and hyponatremia due to nausea and vomiting, and poor intake.  Patient agreeable to plan.  FINAL CLINICAL IMPRESSION(S) / ED DIAGNOSES   Hyponatremia Nausea vomiting Weakness Acute kidney injury Hypotension    Note:  This document was  prepared using Systems analyst and may include unintentional dictation errors.   Harvest Dark, MD 10/03/21 1711

## 2021-10-03 NOTE — Progress Notes (Signed)
Cross Cover Additional 500 fluid bolus ordered for continued hypotension present on admission

## 2021-10-03 NOTE — Telephone Encounter (Signed)
Per Dr. Tasia Catchings, she would like to see pt today in [facility]. Pt's family contacted and is agreeable to bring pt in this afternoon. Appointments scheduled. Family aware.

## 2021-10-03 NOTE — Progress Notes (Signed)
Hematology/Oncology progress note    Patient Care Team: Adin Hector, MD as PCP - General (Internal Medicine) Earlie Server, MD as Consulting Physician (Hematology and Oncology) Clent Jacks, RN as Oncology Nurse Navigator  REFERRING PROVIDER: Adin Hector, MD  CHIEF COMPLAINTS/REASON FOR VISIT:  Malignant ascites, ovarian neoplasm, pulmonary embolism  HISTORY OF PRESENTING ILLNESS:   Brittany Montoya is a  61 y.o.  female with PMH listed below was seen in consultation at the request of  Adin Hector, MD  for evaluation of complicated ovarian cyst, ascites  Patient has noticed generalized abdominal distention and bloating, nausea and constipation for the past months and  03/23/2021, CT abdomen pelvis with contrast showed large solid and cystic right ovarian mass measuring 14.1 x 18.1 x 16 cm highly suspicious for primary ovarian malignancy.  Evidence of peritoneal spread.  Moderate associated ascites. Slightly small liver with mild nodular contour suggesting a degree of cirrhosis.  04/03/2021, CA125 70.9, Hg 4 400 09.  04/04/2021, was seen by gynecology Dr. Ouida Sills.  And was referred to establish care with GYN oncology. 04/09/2021, patient underwent paracentesis and had 6.3 L of hazy yellow fluid removed.  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 Theora Gianotti and me.  Case was discussed on Gynonc tumor board on 04/25/2021. Consensus reached upon clinical diagnosis of locally advanced Stage II/III Ovarian Cancer, plan neoadjuvant chemotherapy followed by debulking surgery.   Patient also has noticed bilateral lower extremity edema, progressively worsening.  Fatigued, shortness of breath with exertion.  Denies any chest pain, cough.  Poor oral intake due to decreased appetite She was accompanied by her husband. Denies any alcohol use or previous hepatitis infection.  She is not aware about cirrhosis. She reports some  symptom relief after the paracentesis.  Her abdomen seems to got better and getting worse again.   8/129/2022 [facility] Cdx  [facility] HRD negative BRAC analysis CDX negative for BRAC 1 and BRAC 2  04/19/21- carbo AUC 5- taxol 135 mg/m2 05/10/21- carbo AUC 6 - taxol 150 mg/m2; reaction to taxol- discontinued 06/07/21- carbo AUC 6 - abraxane 260 mg/m2  # CA125/CEA ratio is <25,  06/29/2021 colonoscopy showed non bleeding internal hemorrhoids. Diverticulosis,  Distal transverse colon 4 mm polyp, resected and retrieved.-Pathology showed polypoid colonic mucosa with intramucosal lymphoid aggregate.  Negative for malignancy and dysplasia. Upper endoscopy showed esophageal mucosal changes suspicious for eosinophilic esophagitis.  Biopsied-pathology showed reflux esophagitis.  Negative for increased eosinophils..  Benign-appearing esophageal stenosis.  Hiatal hernia. 06/01/2021, patient has US abdomen paracentesis and had 4 L of fluid removed.  #06/25/2021 1. Redemonstrated large, mixed solid and cystic mass of the right ovary, which is slightly diminished in size. 2. Peritoneal and omental thickening and nodularity is improved compared to prior examination, although still present. 3. Findings are consistent with treatment response of primary ovarian malignancy and peritoneal and omental metastatic disease. 4. Moderate volume ascites throughout the abdomen and pelvis, similar in volume to prior. 5. No evidence of metastatic disease in the chest.  06/26/2021 therapeutic paracentesis.    06/27/2021 seen by Dr.Secord. patient is deconditioned. Dr.Secord recommend to continue neoadjuvant chemotherapy and optimize her health status with physical therapy and dietitian.   07/23/2021, ultrasound guided paracentesis, 4.1 L removed.  09/05/2021 Liver biopsy at [facility] showed mild Kupffer cells and hepatocellular iron accumulation, 1+ of 4. No significant inflammation.  INTERVAL HISTORY Brittany Montoya is a  61  y.o. female who has above history reviewed by me today presents for  acute visit for nausea vomiting, weakness. Patient was accompanied by her spouse. Patient has had multiple hospitalizations recently. She gets weekly ultrasound-guided paracentesis. Patient has intractable nausea vomiting and has very poor oral intake.  Ongoing chronic abdominal pressure/discomfort/pain.  Denies fever, chills, dysuria. She feels extremely weak today.     Review of Systems  Constitutional:  Positive for appetite change and fatigue. Negative for chills and fever.  HENT:   Negative for hearing loss and voice change.   Eyes:  Negative for eye problems.  Respiratory:  Positive for shortness of breath. Negative for chest tightness and cough.   Cardiovascular:  Negative for chest pain.  Gastrointestinal:  Positive for abdominal distention, abdominal pain, constipation, nausea and vomiting. Negative for blood in stool.  Endocrine: Negative for hot flashes.  Genitourinary:  Negative for difficulty urinating and frequency.   Musculoskeletal:  Negative for arthralgias.  Skin:  Negative for itching and rash.  Neurological:  Negative for extremity weakness.  Hematological:  Negative for adenopathy.  Psychiatric/Behavioral:  Negative for confusion. The patient is not nervous/anxious.    MEDICAL HISTORY:  Past Medical History:  Diagnosis Date   Anxiety    Arthritis    Ascites    Cancer associated pain    Cirrhosis of liver (HCC)    DDD (degenerative disc disease), cervical    Depression    Family history of breast cancer    GERD (gastroesophageal reflux disease)    Glaucoma    Headache    migraines   Hypercoagulable state ([place])    PE 03/2021   Hypertension    Ovarian cancer ([place]) 04/15/2021   PONV (postoperative nausea and vomiting)    Pulmonary embolism ([place]) 04/12/2021   04/12/21 CTA: multiple bilateral lobar, segmental, and subsegmental PEs. Treated with Eliquis.   Sleep apnea    Venous stasis      SURGICAL HISTORY: Past Surgical History:  Procedure Laterality Date   ABDOMINAL HYSTERECTOMY N/A 2008   may have had left ovary removed   [place]     2012, 2015, 2020   COLONOSCOPY N/A 06/29/2021   Procedure: COLONOSCOPY;  Surgeon: Toledo, Benay Pike, MD;  Location: [facility] ENDOSCOPY;  Service: Gastroenterology;  Laterality: N/A;  DOCTORS ARE IN AGREEMENT THAT TOLEDO CAN DO THIS PROCEDURE IN RUSSO'S BLOCK   ESOPHAGOGASTRODUODENOSCOPY N/A 06/29/2021   Procedure: ESOPHAGOGASTRODUODENOSCOPY (EGD);  Surgeon: Toledo, Benay Pike, MD;  Location: [facility] ENDOSCOPY;  Service: Gastroenterology;  Laterality: N/A;   PARACENTESIS  04/20/2021   PILONIDAL CYST / SINUS EXCISION     PORTACATH PLACEMENT Right 06/01/2021   Procedure: INSERTION PORT-A-CATH;  Surgeon: Herbert Pun, MD;  Location: [facility] ORS;  Service: General;  Laterality: Right;    SOCIAL HISTORY: Social History   Socioeconomic History   Marital status: Married    Spouse name: Barnabas Lister   Number of children: 2   Years of education: Not on file   Highest education level: Not on file  Occupational History   Not on file  Tobacco Use   Smoking status: Never   Smokeless tobacco: Never  Vaping Use   Vaping Use: Never used  Substance and Sexual Activity   Alcohol use: No   Drug use: No   Sexual activity: Not Currently    Birth control/protection: Surgical  Other Topics Concern   Not on file  Social History Narrative  Not on file   Social Determinants of Health   Financial Resource Strain: Not on file  Food Insecurity: Not on file  Transportation Needs: Not on file  Physical Activity: Not on file  Stress: Not on file  Social Connections: Not on file  Intimate Partner Violence: Not on file    FAMILY HISTORY: Family History  Problem Relation Age of Onset   Breast cancer Mother 29   Breast cancer Maternal Aunt        d. under 32    ALLERGIES:  is allergic  to paclitaxel, amlodipine, and penicillins.  MEDICATIONS:  Current Outpatient Medications  Medication Sig Dispense Refill   acetaminophen (TYLENOL) 500 MG tablet Take 1,000 mg by mouth at bedtime.     albuterol (VENTOLIN HFA) 108 (90 Base) MCG/ACT inhaler Inhale 1-2 puffs into the lungs every 6 (six) hours as needed for wheezing or shortness of breath.     clonazePAM (KLONOPIN) 0.5 MG tablet Take 1 tablet (0.5 mg total) by mouth daily as needed for anxiety. 30 tablet 1   ELIQUIS 5 MG TABS tablet Take 1 tablet (5 mg total) by mouth 2 (two) times daily. 60 tablet 3   feeding supplement (ENSURE ENLIVE / ENSURE PLUS) LIQD Take 237 mLs by mouth 2 (two) times daily between meals. 14220 mL 12   lidocaine-prilocaine (EMLA) cream Apply 1 application topically daily as needed (pain). Home med     metoCLOPramide (REGLAN) 10 MG tablet Take 1 tablet (10 mg total) by mouth in the morning, at noon, and at bedtime for 14 days. 42 tablet 0   oxyCODONE (OXY IR/ROXICODONE) 5 MG immediate release tablet Take 1 tablet (5 mg total) by mouth every 6 (six) hours as needed for severe pain. 60 tablet 0   prochlorperazine (COMPAZINE) 10 MG tablet Take 1 tablet (10 mg total) by mouth every 6 (six) hours as needed (Nausea or vomiting). 60 tablet 3   No current facility-administered medications for this visit.   Facility-Administered Medications Ordered in Other Visits  Medication Dose Route Frequency Provider Last Rate Last Admin   heparin lock flush 100 unit/mL  500 Units Intravenous Once Earlie Server, MD       LORazepam (ATIVAN) injection 0.5 mg  0.5 mg Intravenous Once PRN Earlie Server, MD       sodium chloride flush (NS) 0.9 % injection 10 mL  10 mL Intravenous PRN Earlie Server, MD   10 mL at 09/21/21 0849     PHYSICAL EXAMINATION: ECOG PERFORMANCE STATUS: 2 - Symptomatic, <50% confined to bed Vitals:   10/03/21 1440  BP: (!) 71/48  Pulse: (!) 122  Temp: 98 F (36.7 C)  SpO2: 96%    There were no vitals filed for  this visit.   Physical Exam Constitutional:      General: She is not in acute distress.    Appearance: She is obese.  HENT:     Head: Normocephalic and atraumatic.  Eyes:     General: No scleral icterus. Cardiovascular:     Rate and Rhythm: Regular rhythm. Tachycardia present.     Heart sounds: Normal heart sounds.  Pulmonary:     Effort: Pulmonary effort is normal. No respiratory distress.     Breath sounds: Normal breath sounds. No wheezing.  Abdominal:     General: Bowel sounds are normal. There is distension.     Palpations: Abdomen is soft.  Musculoskeletal:        General: No deformity. Normal range of motion.  Cervical back: Normal range of motion and neck supple.  Skin:    General: Skin is warm and dry.     Findings: No erythema or rash.  Neurological:     Mental Status: She is alert and oriented to person, place, and time. Mental status is at baseline.     Cranial Nerves: No cranial nerve deficit.     Coordination: Coordination normal.  Psychiatric:        Mood and Affect: Mood normal.    LABORATORY DATA:  I have reviewed the data as listed Lab Results  Component Value Date   WBC 28.0 (H) 10/03/2021   HGB 10.9 (L) 10/03/2021   HCT 33.2 (L) 10/03/2021   MCV 95.4 10/03/2021   PLT 419 (H) 10/03/2021   Recent Labs    09/21/21 0849 09/21/21 1400 09/22/21 0750 09/23/21 0537 09/25/21 0537 10/03/21 1435 10/03/21 1604  NA 124* 127* 128*   < > 129* 124* 123*  K 3.8 4.0 3.6   < > 3.3* 4.5 4.8  CL 86* 90* 93*   < > 97* 85* 85*  CO2 _0 < > _1 GLUCOSE 114* 91 78   < > 95 166* 159*  BUN 43* 43* 39*   < > 25* 38* 40*  CREATININE 3.36* 3.30* 2.49*   < > 1.18* 2.04* 2.15*  CALCIUM 9.7 9.7 9.0   < > 8.4* 9.5 9.9  GFRNONAA 15* 15* 21*   < > 53* 27* 26*  PROT 6.2* 5.8*  --   --   --  6.1*  --   ALBUMIN 2.4* 2.4* 2.5*  --   --  2.0*  --   AST 37 39  --   --   --  45*  --   ALT 15 16  --   --   --  21  --   ALKPHOS 136* 127*  --   --   --  178*   --   BILITOT 0.6 0.9  --   --   --  0.6  --    < > = values in this interval not displayed.    Iron/TIBC/Ferritin/ %Sat    Component Value Date/Time   IRON 50 08/17/2021 0828   TIBC 186 (L) 08/17/2021 0828   FERRITIN 636 (H) 08/17/2021 0828   IRONPCTSAT 27 08/17/2021 0828      RADIOGRAPHIC STUDIES: I have personally reviewed the radiological images as listed and agreed with the findings in the report. DG Chest 2 View  Result Date: 09/21/2021 CLINICAL DATA:  Vomiting and weakness.  History of ovarian cancer. EXAM: CHEST - 2 VIEW COMPARISON:  Chest x-ray dated June 01, 2021. FINDINGS: Unchanged right chest wall port catheter. The heart size and mediastinal contours are within normal limits. Normal pulmonary vascularity. No focal consolidation, pleural effusion, or pneumothorax. No acute osseous abnormality. IMPRESSION: 1. No acute cardiopulmonary disease. Electronically Signed   By: Titus Dubin M.D.   On: 09/21/2021 11:10   DG Abd 1 View  Result Date: 09/22/2021 CLINICAL DATA:  Ovarian cancer with malignant ascites. Obstructive sleep apnea. Nausea and vomiting. EXAM: ABDOMEN - 1 VIEW COMPARISON:  09/07/2021 FINDINGS: Cholecystectomy clips noted. The bowel gas pattern is normal. No radio-opaque calculi or other significant radiographic abnormality are seen. IMPRESSION: Normal bowel gas pattern. Electronically Signed   By: Kerby Moors M.D.   On: 09/22/2021 18:12   DG Abd 1 View  Result Date: 09/07/2021 CLINICAL DATA:  Nausea, bloating EXAM: ABDOMEN - 1 VIEW COMPARISON:  09/02/2021 FINDINGS: The bowel gas pattern is normal. Cholecystectomy clips. Pelvic phleboliths. Minimal spurring in the lumbar spine. IMPRESSION: Normal bowel gas pattern.  No acute findings. Electronically Signed   By: Lucrezia Europe M.D.   On: 09/07/2021 10:39   US RENAL  Result Date: 09/21/2021 CLINICAL DATA:  Acute renal failure. EXAM: RENAL / URINARY TRACT ULTRASOUND COMPLETE COMPARISON:  None. FINDINGS: Right  Kidney: Renal measurements: 9.7 cm x 4.9 cm x 4.5 cm = volume: 109.5 mL. Echogenicity within normal limits. No mass or hydronephrosis visualized. Left Kidney: Renal measurements: 9.9 cm x 4.5 cm x 4.3 cm = volume: 101.03 mL. Echogenicity within normal limits. No mass or hydronephrosis visualized. Bladder: Appears normal for degree of bladder distention. Other: None. IMPRESSION: Normal renal ultrasound. Electronically Signed   By: Virgina Norfolk M.D.   On: 09/21/2021 22:33   US Paracentesis  Result Date: 09/28/2021 INDICATION: Recurrent ascites secondary to ovarian cancer. Request for therapeutic paracentesis up to 4 L. EXAM: ULTRASOUND GUIDED PARACENTESIS MEDICATIONS: 1% lidocaine 10 mL COMPLICATIONS: None immediate. PROCEDURE: Informed written consent was obtained from the patient after a discussion of the risks, benefits and alternatives to treatment. A timeout was performed prior to the initiation of the procedure. Initial ultrasound scanning demonstrates a large amount of ascites within the left lateral abdomen. The left lateral abdomen was prepped and draped in the usual sterile fashion. 1% lidocaine was used for local anesthesia. Following this, a 19 gauge, 7-cm, Yueh catheter was introduced. An ultrasound image was saved for documentation purposes. The paracentesis was performed. The catheter was removed and a dressing was applied. The patient tolerated the procedure well without immediate post procedural complication. FINDINGS: A total of approximately 4 L of clear yellow fluid was removed. IMPRESSION: Successful ultrasound-guided paracentesis yielding 4 liters of peritoneal fluid. Read by: Gareth Eagle, PA-C Electronically Signed   By: Albin Felling M.D.   On: 09/28/2021 15:12   US Paracentesis  Result Date: 09/21/2021 INDICATION: Patient with a history of ovarian cancer and recurrent large volume ascites presents today for therapeutic diagnostic paracentesis. EXAM: ULTRASOUND GUIDED PARACENTESIS  MEDICATIONS: 1% lidocaine 10 mL COMPLICATIONS: None immediate. PROCEDURE: Informed written consent was obtained from the patient after a discussion of the risks, benefits and alternatives to treatment. A timeout was performed prior to the initiation of the procedure. Initial ultrasound scanning demonstrates a large amount of ascites within the right lower abdominal quadrant. The right lower abdomen was prepped and draped in the usual sterile fashion. 1% lidocaine was used for local anesthesia. Following this, a 19 gauge, 7-cm, Yueh catheter was introduced. An ultrasound image was saved for documentation purposes. The paracentesis was performed. The catheter was removed and a dressing was applied. The patient tolerated the procedure well without immediate post procedural complication. FINDINGS: A total of approximately 5 L of amber-colored fluid was removed. Samples were sent to the laboratory as requested by the clinical team. IMPRESSION: Successful ultrasound-guided paracentesis yielding 5 liters of peritoneal fluid. Read by: Soyla Dryer, NP Electronically Signed   By: Aletta Edouard M.D.   On: 09/21/2021 17:15   US Paracentesis  Result Date: 09/13/2021 INDICATION: ascites EXAM: ULTRASOUND GUIDED  PARACENTESIS MEDICATIONS: None. COMPLICATIONS: None immediate. PROCEDURE: Informed written consent was obtained from the patient after a discussion of the risks, benefits and alternatives to treatment. A timeout was performed prior to the initiation of the procedure. Initial ultrasound scanning demonstrates a large amount of ascites within  the right lower abdominal quadrant. The right lower abdomen was prepped and draped in the usual sterile fashion. 1% lidocaine was used for local anesthesia. Following this, a 19 gauge, 10-cm, Yueh catheter was introduced. An ultrasound image was saved for documentation purposes. The paracentesis was performed. The catheter was removed and a dressing was applied. The patient  tolerated the procedure well without immediate post procedural complication. FINDINGS: A total of approximately 4.0 of dark amber fluid was removed. IMPRESSION: Successful ultrasound-guided paracentesis yielding approximately 4.0 liters of dark amber peritoneal fluid. Electronically Signed   By: Albin Felling M.D.   On: 09/13/2021 08:31   US Paracentesis  Result Date: 09/07/2021 INDICATION: History of ovarian cancer. Please perform ultrasound-guided paracentesis for therapeutic purposes. EXAM: ULTRASOUND-GUIDED PARACENTESIS COMPARISON:  Multiple previous ultrasound-guided paracenteses, most recently on 08/31/2021 yielding sphenoid cc of ascitic fluid MEDICATIONS: None. COMPLICATIONS: None immediate. TECHNIQUE: Informed written consent was obtained from the patient after a discussion of the risks, benefits and alternatives to treatment. A timeout was performed prior to the initiation of the procedure. Initial ultrasound scanning demonstrates a large amount of ascites within the right lower abdomen which was subsequently prepped and draped in the usual sterile fashion. 1% lidocaine with epinephrine was used for local anesthesia. Under direct ultrasound guidance, a 19 gauge, 7-cm, Yueh catheter was introduced. An ultrasound image was saved for documentation purposed. The paracentesis was performed. The catheter was removed and a dressing was applied. The patient tolerated the procedure well without immediate post procedural complication. FINDINGS: A total of approximately 4.6 liters of blood-tinged serous fluid was removed. Samples were sent to the laboratory as requested by the clinical team. IMPRESSION: Successful ultrasound-guided paracentesis yielding 4.6 liters of peritoneal fluid. Electronically Signed   By: Sandi Mariscal M.D.   On: 09/07/2021 16:29      ASSESSMENT & PLAN:  1. Malignant ascites   2. Malignant neoplasm of right ovary (HCC)   3. Intractable nausea and vomiting   4. Acute kidney injury  ([place])   5. Neoplasm related pain   6. Hypoalbuminemia due to protein-calorie malnutrition ([place])   7. Goals of care, counseling/discussion    Cancer Staging  Ovarian cancer [facility]) Staging form: Ovary, Fallopian Tube, and Primary Peritoneal Carcinoma, AJCC 8th Edition - Clinical stage from 04/18/2021: FIGO Stage II, calculated as Stage Unknown (cT2, cNX, cM0) - Signed by Earlie Server, MD on 04/19/2021   #locally advanced ovarian cancer  Large ovarian cystic mass with malignant ascites, peritoneal nodularity She finished the planned  6 cycles of neoadjuvant chemo with G-CSF support. CA 125 has responded well, Last chemo therapy on 08/17/2021. however started to climb up shortly after chemo was stopped. Plan of debulking surgery was canceled due to poor performance status.  I discussed with gynecology oncology Dr. Theora Gianotti today.  She is not a surgical candidate.  #Recurrent ascites.  This could be secondary to her ovarian cancer however there is suspicion of an component of right heart failure. She has radiographic evidence of cirrhosis however liver biopsy at [facility] did not confirm that. Suspect right heart failure. BNP today was 318.  Echocardiogram was previously scheduled however she missed appointment due to being admitted admission. Outpatient 2D echo is scheduled tomorrow again and she is going to miss it due to being admitted.  I appreciate if she could have the 2D echo done during this admission. I did Discussed with patient again the option of peritoneal catheter for symptom palliation.  She agrees.  Hopefully we can  get this done during her admission.  I recommend diagnostic paracentesis along with a catheter placement to rule out SBP.   #Hypotension, AKI, likely due to poor oral intake, nausea vomiting secondary to abdominal distention recurrent ascites.  I recommend patient go to emergency room for further evaluation, hospitalized for management.  #Intractable nausea vomiting, due to recurrent  ascites. Will send patient to emergency room to be admitted for IV hydration and IV antiemetics.   Goals of care discussion, I had a lengthy discussion with the patient, her husband, and I also discussed with patient's daughter over the phone.  Single agent Avastin could be considered in the future if her performance status improves, if she does not have right heart failure.  20 prognosis is poor,  and the likelihood of her getting better is slim.   Comfort care/hospice were discussed.  Patient is undecided and wanted to further consider.  Please consult palliative care service during her current admission for further discussion.  #Hypoalbuminemia due to malnutrition.   Recommend patient to go to emergency room for further evaluation.  She agrees with the plan.  I called ER triage nurse and gave the report.  Please consult Oncology and  palliative care service during this admission.  All questions were answered. The patient knows to call the clinic with any problems questions or concerns.  cc Adin Hector, MD   Follow up plan  TBD  We spent sufficient time to discuss many aspect of care, questions were answered to patient's satisfaction.  Earlie Server

## 2021-10-03 NOTE — H&P (Signed)
History and Physical    Pecola Haxton LSL:[id number] DOB: 08/28/1961 DOA: 10/03/2021  PCP: Adin Hector, MD    Patient coming from:  Dr. Collie Siad office for f/u appt and found to be hypotensive and sent to er.    Chief Complaint:  Nausea/ vomiting.   HPI:  Latrelle Fuston is a 61 y.o. female seen in ed with complaints of nausea/ vomiting similar to her earlier admission this month, per chart review  reflux esophagitis based on recent EGD negative for any eosinophils, pt has not had chemo for about a month now.  Pt has been unable to keep down for a month now and has gotten worse this past week. Pt also reports chest discomfort , none now, last time she had it was last week, it started several years ago and has been intermittent and associates with eating and worse with spicy foods and better with Pepcid. Ekg is same as before and echo is pending.  Vomiting is intermittent and multiple times a day. Abdominal pain is generalized, nonradiating, sometimes crampy, no alleviating or exacerbating factors. Review of systems is negative for any fevers chills dysuria hematemesis hematochezia chest pain shortness of breath palpitations lower extremity swelling skin rashes any bleeding. Chart review shows mention of permanent drainage catheter for patient's ascites as patient receives therapeutic paracentesis on a weekly basis. Her last paracentesis was 09/28/2021 and next one is scheduled for 1.20.2023. Of note patient was also diagnosed with pulmonary embolism and started on Eliquis.  Patient scheduled for an echocardiogram and procedure still pending. Patient upper endoscopy and colonoscopy on June 29, 2021.  Results as follows: EGD: - Esophageal mucosal changes suspicious for eosinophilic esophagitis - negative biopsy. - Benign-appearing esophageal stenosis. - 1 cm hiatal hernia. - Normal examined duodenum. - The examination was otherwise normal. Colonoscopy: - Non-bleeding  internal hemorrhoids. - Mild diverticulosis in the entire examined colon. There was no evidence of diverticular bleeding. - One 4 mm polyp in the distal transverse colon, removed with a cold biopsy forceps. Resected and retrieved.  Pt has past medical history of ovarian cancer, diabetes, depression, GERD, pulmonary embolism diagnosed 04/12/2021, obstructive sleep apnea, allergies to paclitaxel, amlodipine, penicillin.  ED Course:   Vitals:   10/03/21 1559 10/03/21 1715 10/03/21 1820 10/03/21 1830  BP: (!) 89/70 91/64 93/62 (!) 82/63  Pulse: (!) 127 (!) 107 (!) 115 (!) 106  Resp: _0 Temp: 98.4 F (36.9 C)     TempSrc: Oral     SpO2: 91% 94% 97% 95%  In the emergency room patient's initial evaluation chest.  Hypotensive tachycardic afebrile 91% on room air. Blood work today shows hyponatremia of 123 worse than the 129 that she had on the 10th, glucose 159, creatinine 2.15 which is acute, alk phos 178, albumin 2.0, AST 45, BNP of 318.6. CBC with white count of 28, hemoglobin of 10.9, platelets of 119. Respiratory panel negative for flu and COVID. In ed pt received NS at 150 cc/ hour.    Review of Systems:  Review of Systems  Cardiovascular:  Positive for chest pain.  Gastrointestinal:  Positive for abdominal pain, heartburn, nausea and vomiting. Negative for blood in stool, diarrhea and melena.  Neurological:  Positive for weakness.  All other systems reviewed and are negative.  Past Medical History:  Diagnosis Date   Anxiety    Arthritis    Ascites    Cancer associated pain    Cirrhosis of liver ([place])  DDD (degenerative disc disease), cervical    Depression    Family history of breast cancer    GERD (gastroesophageal reflux disease)    Glaucoma    Headache    migraines   Hypercoagulable state ([place])    PE 03/2021   Hypertension    Ovarian cancer ([place]) 04/15/2021   PONV (postoperative nausea and vomiting)    Pulmonary embolism ([place]) 04/12/2021   04/12/21 CTA:  multiple bilateral lobar, segmental, and subsegmental PEs. Treated with Eliquis.   Sleep apnea    Venous stasis     Past Surgical History:  Procedure Laterality Date   ABDOMINAL HYSTERECTOMY N/A 2008   may have had left ovary removed   [place]     2012, 2015, 2020   COLONOSCOPY N/A 06/29/2021   Procedure: COLONOSCOPY;  Surgeon: Toledo, Benay Pike, MD;  Location: [facility] ENDOSCOPY;  Service: Gastroenterology;  Laterality: N/A;  DOCTORS ARE IN AGREEMENT THAT TOLEDO CAN DO THIS PROCEDURE IN RUSSO'S BLOCK   ESOPHAGOGASTRODUODENOSCOPY N/A 06/29/2021   Procedure: ESOPHAGOGASTRODUODENOSCOPY (EGD);  Surgeon: Toledo, Benay Pike, MD;  Location: [facility] ENDOSCOPY;  Service: Gastroenterology;  Laterality: N/A;   PARACENTESIS  04/20/2021   PILONIDAL CYST / SINUS EXCISION     PORTACATH PLACEMENT Right 06/01/2021   Procedure: INSERTION PORT-A-CATH;  Surgeon: Herbert Pun, MD;  Location: [facility] ORS;  Service: General;  Laterality: Right;     reports that she has never smoked. She has never used smokeless tobacco. She reports that she does not drink alcohol and does not use drugs.  Allergies  Allergen Reactions   Paclitaxel Shortness Of Breath and Other (See Comments)    pt flush, stated she didn't feel well, taxol stopped (05/10/2021)   Amlodipine Other (See Comments) and Rash    Not effective Not effective    Penicillins Rash    Family History  Problem Relation Age of Onset   Breast cancer Mother 55   Breast cancer Maternal Aunt        d. under 26    Prior to Admission medications   Medication Sig Start Date End Date Taking? Authorizing Provider  acetaminophen (TYLENOL) 500 MG tablet Take 1,000 mg by mouth at bedtime.   Yes [provider]  ELIQUIS 5 MG TABS tablet Take 1 tablet (5 mg total) by mouth 2 (two) times daily. 09/19/21  Yes Earlie Server, MD  metoCLOPramide (REGLAN) 10 MG tablet Take 1 tablet (10 mg total) by  mouth in the morning, at noon, and at bedtime for 14 days. 09/25/21 10/09/21 Yes Enzo Bi, MD  oxyCODONE (OXY IR/ROXICODONE) 5 MG immediate release tablet Take 1 tablet (5 mg total) by mouth every 6 (six) hours as needed for severe pain. 09/14/21  Yes Verlon Au, NP  albuterol (VENTOLIN HFA) 108 (90 Base) MCG/ACT inhaler Inhale 1-2 puffs into the lungs every 6 (six) hours as needed for wheezing or shortness of breath. 03/03/21   [provider]  clonazePAM (KLONOPIN) 0.5 MG tablet Take 1 tablet (0.5 mg total) by mouth daily as needed for anxiety. 09/29/21   Earlie Server, MD  feeding supplement (ENSURE ENLIVE / ENSURE PLUS) LIQD Take 237 mLs by mouth 2 (two) times daily between meals. 09/08/21   Loletha Grayer, MD  lidocaine-prilocaine (EMLA) cream Apply 1 application topically daily as needed (pain). Home med 09/25/21   Enzo Bi, MD  prochlorperazine (COMPAZINE) 10 MG tablet Take 1 tablet (10 mg  total) by mouth every 6 (six) hours as needed (Nausea or vomiting). Patient not taking: Reported on 10/03/2021 09/19/21   Earlie Server, MD    Physical Exam: Vitals:   10/03/21 1559 10/03/21 1715 10/03/21 1820 10/03/21 1830  BP: (!) 89/70 91/64 93/62 (!) 82/63  Pulse: (!) 127 (!) 107 (!) 115 (!) 106  Resp: _0 Temp: 98.4 F (36.9 C)     TempSrc: Oral     SpO2: 91% 94% 97% 95%   Physical Exam Vitals reviewed.  Constitutional:      General: She is not in acute distress.    Appearance: She is not ill-appearing.  HENT:     Head: Normocephalic and atraumatic.     Right Ear: External ear normal.     Left Ear: External ear normal.     Nose: Nose normal.     Mouth/Throat:     Mouth: Mucous membranes are moist.  Eyes:     Extraocular Movements: Extraocular movements intact.     Pupils: Pupils are equal, round, and reactive to light.  Neck:     Vascular: No carotid bruit.  Cardiovascular:     Rate and Rhythm: Normal rate and regular rhythm.     Pulses: Normal pulses.     Heart sounds:  Normal heart sounds.  Pulmonary:     Effort: Pulmonary effort is normal.     Breath sounds: Rales present.  Abdominal:     General: Bowel sounds are normal. There is distension.     Palpations: Abdomen is soft. There is no hepatomegaly or mass.     Tenderness: There is no abdominal tenderness. There is no guarding.     Hernia: No hernia is present.  Musculoskeletal:     Right lower leg: 1+ Edema present.     Left lower leg: 1+ Edema present.  Skin:    General: Skin is warm.  Neurological:     General: No focal deficit present.     Mental Status: She is alert and oriented to person, place, and time.     Cranial Nerves: No cranial nerve deficit.  Psychiatric:        Mood and Affect: Mood normal.        Behavior: Behavior normal.   Labs on Admission: I have personally reviewed following labs and imaging studies No results for input(s): CKTOTAL, CKMB, TROPONINI in the last 72 hours. Lab Results  Component Value Date   WBC 28.0 (H) 10/03/2021   HGB 10.9 (L) 10/03/2021   HCT 33.2 (L) 10/03/2021   MCV 95.4 10/03/2021   PLT 419 (H) 10/03/2021    Recent Labs  Lab 10/03/21 1435 10/03/21 1604  NA 124* 123*  K 4.5 4.8  CL 85* 85*  CO2 25 25  BUN 38* 40*  CREATININE 2.04* 2.15*  CALCIUM 9.5 9.9  PROT 6.1*  --   BILITOT 0.6  --   ALKPHOS 178*  --   ALT 21  --   AST 45*  --   GLUCOSE 166* 159*   No results found for: CHOL, HDL, LDLCALC, TRIG No results found for: DDIMER Invalid input(s): POCBNP   COVID-19 Labs No results for input(s): DDIMER, FERRITIN, LDH, CRP in the last 72 hours. Lab Results  Component Value Date   SARSCOV2NAA NEGATIVE 10/03/2021   [place] NEGATIVE 09/21/2021   [place] NEGATIVE 09/07/2021   [place] NEGATIVE 08/31/2021    Radiological Exams on Admission: No results found.  EKG: Independently reviewed.  Sinus  tachycardia at 121, nonspecific st changes in lead II normal axis and normal interval.    Assessment/Plan: Principal  Problem:   Nausea and vomiting Active Problems:   Ovarian cancer (HCC)   Sepsis (HCC)   Hyponatremia   Acute renal failure superimposed on stage 3a chronic kidney disease (HCC)   Pulmonary embolus (HCC)   Anemia   Essential hypertension   Obstructive sleep apnea syndrome   Glaucoma (increased eye pressure)   Nausea vomiting: Differentials include from gastritis, reflux, portal hypertension, gastropathy, cirrhosis, PUD. Will admit to MedSurg unit and start supportive care with IV fluids and antiemetics.  Ovarian cancer/ascites: Patient closely followed by oncology group and receiving therapeutic paracentesis on a weekly basis.  Family interested in possible catheter for recurrent malignant ascites. A.m. team to discuss with patient.  Sepsis: Patient met sepsis criteria. Suspect is noninfectious.  Hyponatremia: Chronic intermittent hyponatremia. Attribute to dehydration. Cont with ns and iv bolus 500cc ns for hypovolemic hypotension given.    AKI on CKD: Lab Results  Component Value Date   CREATININE 2.15 (H) 10/03/2021   CREATININE 2.04 (H) 10/03/2021   CREATININE 1.18 (H) 09/25/2021  Avoid renally cleared meds and renally dose.  PE/ Anemia: Cont eliquis at 2.5 mg bid.  Monitor hb with daily cbc and follow.    HTN; Blood pressure (!) 82/63, pulse (!) 106, temperature 98.4 F (36.9 C), temperature source Oral, resp. rate 15, SpO2 95 %. Hold all bp meds.  Cont with ivf and hold al bp meds.   OSA: Cont with cpap.   Glaucoma: Cont with home eye drops.       DVT prophylaxis:  Eliquis renally dosed at 2.5 mg bid.    Code Status:  Full code    Family Communication:  Dennis, Killilea (Spouse)  [phone] (Mobile)   Disposition Plan:  TBD.   Consults called:  None   Admission status: Inpatient.     Medical Decision Making   Coding    Para Skeans MD [facility]  6 PM- 2 AM. Please contact me via secure Chat 6 PM-2 AM. To contact  the [facility] Attending or Consulting provider [place] or covering provider during after hours [place], for this patient.   Check the care team in [facility] and look for a) attending/consulting [facility] provider listed and b) the [facility] team listed Log into [email or web address] and use [place]'s universal password to access. If you do not have the password, please contact the hospital operator. Locate the [facility] provider you are looking for under [facility] and page to a number that you can be directly reached. If you still have difficulty reaching the provider, please page the [facility] (Director on Call) for the Hospitalists listed on amion for assistance. [email or web address] 10/03/2021, 7:48 PM

## 2021-10-03 NOTE — ED Triage Notes (Addendum)
Pt comes with c/o abnormal labs. Pt states she has ovarian cancer. Pt has been feeling weak. Pt in AKI per MD.  Pt states some pain in belly.  Pt unable to eat and keep medications down for last month. Pt is pale in appearance.   Pt has port accessed upon arrival. Blood obtained and port flushed. Good blood return. Dressing in place and site clean.

## 2021-10-03 NOTE — ED Notes (Signed)
EDP at bedside  

## 2021-10-04 ENCOUNTER — Observation Stay (HOSPITAL_COMMUNITY)
Admit: 2021-10-04 | Discharge: 2021-10-04 | Disposition: A | Discharge status: Home / Self Care | Payer: BC Managed Care – PPO | Attending: Internal Medicine | Admitting: Internal Medicine

## 2021-10-04 ENCOUNTER — Ambulatory Visit: Payer: BC Managed Care – PPO

## 2021-10-04 ENCOUNTER — Observation Stay: Payer: BC Managed Care – PPO

## 2021-10-04 DIAGNOSIS — E871 Hypo-osmolality and hyponatremia: Secondary | ICD-10-CM

## 2021-10-04 DIAGNOSIS — R531 Weakness: Secondary | ICD-10-CM

## 2021-10-04 DIAGNOSIS — R0609 Other forms of dyspnea: Secondary | ICD-10-CM

## 2021-10-04 DIAGNOSIS — C561 Malignant neoplasm of right ovary: Secondary | ICD-10-CM

## 2021-10-04 DIAGNOSIS — Z7189 Other specified counseling: Secondary | ICD-10-CM

## 2021-10-04 DIAGNOSIS — C569 Malignant neoplasm of unspecified ovary: Secondary | ICD-10-CM

## 2021-10-04 DIAGNOSIS — G893 Neoplasm related pain (acute) (chronic): Secondary | ICD-10-CM | POA: Diagnosis present

## 2021-10-04 DIAGNOSIS — Z66 Do not resuscitate: Secondary | ICD-10-CM | POA: Diagnosis not present

## 2021-10-04 DIAGNOSIS — N179 Acute kidney failure, unspecified: Secondary | ICD-10-CM | POA: Diagnosis present

## 2021-10-04 DIAGNOSIS — E860 Dehydration: Secondary | ICD-10-CM | POA: Diagnosis present

## 2021-10-04 DIAGNOSIS — Z9221 Personal history of antineoplastic chemotherapy: Secondary | ICD-10-CM | POA: Diagnosis not present

## 2021-10-04 DIAGNOSIS — D649 Anemia, unspecified: Secondary | ICD-10-CM | POA: Diagnosis present

## 2021-10-04 DIAGNOSIS — E861 Hypovolemia: Secondary | ICD-10-CM | POA: Diagnosis present

## 2021-10-04 DIAGNOSIS — I10 Essential (primary) hypertension: Secondary | ICD-10-CM | POA: Diagnosis not present

## 2021-10-04 DIAGNOSIS — K5669 Other partial intestinal obstruction: Secondary | ICD-10-CM

## 2021-10-04 DIAGNOSIS — G4733 Obstructive sleep apnea (adult) (pediatric): Secondary | ICD-10-CM | POA: Diagnosis present

## 2021-10-04 DIAGNOSIS — R112 Nausea with vomiting, unspecified: Secondary | ICD-10-CM | POA: Diagnosis not present

## 2021-10-04 DIAGNOSIS — I959 Hypotension, unspecified: Secondary | ICD-10-CM | POA: Diagnosis present

## 2021-10-04 DIAGNOSIS — Z86711 Personal history of pulmonary embolism: Secondary | ICD-10-CM | POA: Diagnosis not present

## 2021-10-04 DIAGNOSIS — N1831 Chronic kidney disease, stage 3a: Secondary | ICD-10-CM | POA: Diagnosis present

## 2021-10-04 DIAGNOSIS — H409 Unspecified glaucoma: Secondary | ICD-10-CM | POA: Diagnosis present

## 2021-10-04 DIAGNOSIS — K56609 Unspecified intestinal obstruction, unspecified as to partial versus complete obstruction: Secondary | ICD-10-CM | POA: Diagnosis not present

## 2021-10-04 DIAGNOSIS — Z803 Family history of malignant neoplasm of breast: Secondary | ICD-10-CM | POA: Diagnosis not present

## 2021-10-04 DIAGNOSIS — C786 Secondary malignant neoplasm of retroperitoneum and peritoneum: Secondary | ICD-10-CM | POA: Diagnosis present

## 2021-10-04 DIAGNOSIS — Z515 Encounter for palliative care: Secondary | ICD-10-CM

## 2021-10-04 DIAGNOSIS — Z20822 Contact with and (suspected) exposure to covid-19: Secondary | ICD-10-CM | POA: Diagnosis present

## 2021-10-04 DIAGNOSIS — N170 Acute kidney failure with tubular necrosis: Secondary | ICD-10-CM | POA: Diagnosis not present

## 2021-10-04 DIAGNOSIS — Z7901 Long term (current) use of anticoagulants: Secondary | ICD-10-CM | POA: Diagnosis not present

## 2021-10-04 DIAGNOSIS — I129 Hypertensive chronic kidney disease with stage 1 through stage 4 chronic kidney disease, or unspecified chronic kidney disease: Secondary | ICD-10-CM | POA: Diagnosis present

## 2021-10-04 DIAGNOSIS — R180 Malignant ascites: Secondary | ICD-10-CM | POA: Diagnosis present

## 2021-10-04 DIAGNOSIS — D508 Other iron deficiency anemias: Secondary | ICD-10-CM | POA: Diagnosis not present

## 2021-10-04 LAB — CBC WITH DIFFERENTIAL/PLATELET
Abs Immature Granulocytes: 0.13 10*3/uL — ABNORMAL HIGH (ref 0.00–0.07)
Basophils Absolute: 0 10*3/uL (ref 0.0–0.1)
Basophils Relative: 0 %
Eosinophils Absolute: 0 10*3/uL (ref 0.0–0.5)
Eosinophils Relative: 0 %
HCT: 27.3 % — ABNORMAL LOW (ref 36.0–46.0)
Hemoglobin: 9.3 g/dL — ABNORMAL LOW (ref 12.0–15.0)
Immature Granulocytes: 1 %
Lymphocytes Relative: 2 %
Lymphs Abs: 0.6 10*3/uL — ABNORMAL LOW (ref 0.7–4.0)
MCH: 31.7 pg (ref 26.0–34.0)
MCHC: 34.1 g/dL (ref 30.0–36.0)
MCV: 93.2 fL (ref 80.0–100.0)
Monocytes Absolute: 0.8 10*3/uL (ref 0.1–1.0)
Monocytes Relative: 3 %
Neutro Abs: 23 10*3/uL — ABNORMAL HIGH (ref 1.7–7.7)
Neutrophils Relative %: 94 %
Platelets: 321 10*3/uL (ref 150–400)
RBC: 2.93 MIL/uL — ABNORMAL LOW (ref 3.87–5.11)
RDW: 17.3 % — ABNORMAL HIGH (ref 11.5–15.5)
WBC: 24.5 10*3/uL — ABNORMAL HIGH (ref 4.0–10.5)
nRBC: 0 % (ref 0.0–0.2)

## 2021-10-04 LAB — HEPATIC FUNCTION PANEL
ALT: 16 U/L (ref 0–44)
AST: 31 U/L (ref 15–41)
Albumin: <1.5 g/dL — ABNORMAL LOW (ref 3.5–5.0)
Alkaline Phosphatase: 135 U/L — ABNORMAL HIGH (ref 38–126)
Bilirubin, Direct: 0.3 mg/dL — ABNORMAL HIGH (ref 0.0–0.2)
Indirect Bilirubin: 0.6 mg/dL (ref 0.3–0.9)
Total Bilirubin: 0.9 mg/dL (ref 0.3–1.2)
Total Protein: 5 g/dL — ABNORMAL LOW (ref 6.5–8.1)

## 2021-10-04 LAB — URINALYSIS, ROUTINE W REFLEX MICROSCOPIC
Bacteria, UA: NONE SEEN
Bilirubin Urine: NEGATIVE
Glucose, UA: NEGATIVE mg/dL
Ketones, ur: NEGATIVE mg/dL
Nitrite: NEGATIVE
Protein, ur: NEGATIVE mg/dL
Specific Gravity, Urine: 1.02 (ref 1.005–1.030)
pH: 5 (ref 5.0–8.0)

## 2021-10-04 LAB — MAGNESIUM: Magnesium: 2.5 mg/dL — ABNORMAL HIGH (ref 1.7–2.4)

## 2021-10-04 LAB — BASIC METABOLIC PANEL
Anion gap: 11 (ref 5–15)
BUN: 41 mg/dL — ABNORMAL HIGH (ref 8–23)
CO2: 25 mmol/L (ref 22–32)
Calcium: 8.7 mg/dL — ABNORMAL LOW (ref 8.9–10.3)
Chloride: 88 mmol/L — ABNORMAL LOW (ref 98–111)
Creatinine, Ser: 1.96 mg/dL — ABNORMAL HIGH (ref 0.44–1.00)
GFR, Estimated: 29 mL/min — ABNORMAL LOW (ref >60)
Glucose, Bld: 102 mg/dL — ABNORMAL HIGH (ref 70–99)
Potassium: 4.8 mmol/L (ref 3.5–5.1)
Sodium: 124 mmol/L — ABNORMAL LOW (ref 135–145)

## 2021-10-04 LAB — ECHOCARDIOGRAM COMPLETE
AR max vel: 2.12 cm2
AV Area VTI: 2.5 cm2
AV Area mean vel: 2.32 cm2
AV Mean grad: 4 mmHg
AV Peak grad: 7.3 mmHg
Ao pk vel: 1.35 m/s
Area-P 1/2: 4.54 cm2
Height: 61 in
S' Lateral: 2.51 cm
Weight: 2811.31 oz

## 2021-10-04 LAB — MRSA NEXT GEN BY PCR, NASAL: MRSA by PCR Next Gen: NOT DETECTED

## 2021-10-04 LAB — LACTIC ACID, PLASMA
Lactic Acid, Venous: 2.1 mmol/L (ref 0.5–1.9)
Lactic Acid, Venous: 1.2 mmol/L (ref 0.5–1.9)
Lactic Acid, Venous: 1.1 mmol/L (ref 0.5–1.9)

## 2021-10-04 LAB — PROCALCITONIN: Procalcitonin: 2.14 ng/mL

## 2021-10-04 MED ORDER — CHLORHEXIDINE GLUCONATE CLOTH 2 % EX PADS
6.0000 | MEDICATED_PAD | Freq: Every day | CUTANEOUS | Status: DC
  Administered 2021-10-05 – 2021-10-06 (×2): 6 via TOPICAL

## 2021-10-04 MED ORDER — CLONAZEPAM 0.5 MG PO TABS: 0.5000 mg | ORAL_TABLET | Freq: Every day | ORAL | Status: DC | PRN

## 2021-10-04 MED ORDER — PROCHLORPERAZINE MALEATE 10 MG PO TABS
10.0000 mg | ORAL_TABLET | Freq: Four times a day (QID) | ORAL | Status: DC | PRN
  Filled 2021-10-04: qty 1

## 2021-10-04 MED ORDER — METOCLOPRAMIDE HCL 5 MG/ML IJ SOLN
5.0000 mg | Freq: Four times a day (QID) | INTRAMUSCULAR | Status: DC
  Administered 2021-10-04 – 2021-10-06 (×8): 5 mg via INTRAVENOUS
  Filled 2021-10-04 (×8): qty 2

## 2021-10-04 MED ORDER — SODIUM CHLORIDE 0.9 % IV SOLN: Freq: Once | INTRAVENOUS | Status: DC

## 2021-10-04 MED ORDER — SODIUM CHLORIDE 0.9 % IV SOLN: INTRAVENOUS | Status: DC

## 2021-10-04 MED ORDER — SODIUM CHLORIDE 0.9 % IV BOLUS
500.0000 mL | Freq: Once | INTRAVENOUS | Status: AC
  Administered 2021-10-04: 12:00:00 500 mL via INTRAVENOUS

## 2021-10-04 MED ORDER — MAGNESIUM SULFATE 4 GM/100ML IV SOLN
4.0000 g | Freq: Once | INTRAVENOUS | Status: AC
  Administered 2021-10-04: 4 g via INTRAVENOUS
  Filled 2021-10-04: qty 100

## 2021-10-04 MED ORDER — METOCLOPRAMIDE HCL 5 MG PO TABS: 10.0000 mg | ORAL_TABLET | Freq: Three times a day (TID) | ORAL | Status: DC | PRN

## 2021-10-04 MED ORDER — OXYCODONE HCL 5 MG PO TABS: 5.0000 mg | ORAL_TABLET | Freq: Four times a day (QID) | ORAL | Status: DC | PRN

## 2021-10-04 MED ORDER — VANCOMYCIN HCL 750 MG/150ML IV SOLN: 750.0000 mg | INTRAVENOUS | Status: DC

## 2021-10-04 MED ORDER — HYDROMORPHONE HCL 1 MG/ML IJ SOLN
0.5000 mg | Freq: Four times a day (QID) | INTRAMUSCULAR | Status: AC | PRN
  Administered 2021-10-04 – 2021-10-05 (×4): 0.5 mg via INTRAVENOUS
  Filled 2021-10-04 (×4): qty 1

## 2021-10-04 MED ORDER — DEXAMETHASONE SODIUM PHOSPHATE 4 MG/ML IJ SOLN
12.0000 mg | Freq: Every day | INTRAMUSCULAR | Status: DC
  Administered 2021-10-04 – 2021-10-05 (×2): 12 mg via INTRAVENOUS
  Filled 2021-10-04 (×2): qty 3

## 2021-10-04 MED ORDER — SODIUM CHLORIDE 0.9 % IV BOLUS
1220.0000 mL | Freq: Once | INTRAVENOUS | Status: AC
  Administered 2021-10-04: 1000 mL via INTRAVENOUS

## 2021-10-04 MED ORDER — SODIUM CHLORIDE 0.9 % IV SOLN
1.0000 g | Freq: Two times a day (BID) | INTRAVENOUS | Status: DC
  Administered 2021-10-04: 1 g via INTRAVENOUS
  Filled 2021-10-04 (×2): qty 1

## 2021-10-04 MED ORDER — ALBUTEROL SULFATE (2.5 MG/3ML) 0.083% IN NEBU: 3.0000 mL | INHALATION_SOLUTION | Freq: Four times a day (QID) | RESPIRATORY_TRACT | Status: DC | PRN

## 2021-10-04 MED ORDER — ENOXAPARIN SODIUM 80 MG/0.8ML IJ SOSY
1.0000 mg/kg | PREFILLED_SYRINGE | Freq: Every day | INTRAMUSCULAR | Status: DC
  Administered 2021-10-04 – 2021-10-06 (×3): 80 mg via SUBCUTANEOUS
  Filled 2021-10-04 (×3): qty 0.8

## 2021-10-04 NOTE — Consult Note (Addendum)
[facility] SURGICAL CONSULTATION NOTE (initial) - cpt: 99254   HISTORY OF PRESENT ILLNESS (HPI):  61 y.o. female presented to [facility] ED yesterday for evaluation of nausea and emesis. Patient has a history of ovarian cancer (Stage II/III - 04/2021), she has complete 6/6 cycles of chemotherapy, last in Dec 2022, and followed with Dr Theora Gianotti ([place]) and deemed not a candidate for debulking surgery secondary to performance status. She was following up with Dr Tasia Catchings yesterday and found to have subjective progressive weakness and hypotension. As such, she was sent to the ED. She also reports associated nausea and emesis over the last few weeks as well. She has been able to pass gas and her last BM was Monday. She has baseline abdominal distension and discomfort secondary to ascites. No fever, chills, cough, CP, SOB. Work up in oncology revealed a leukocytosis to 28.0K (now 24.5), Hgb stable at 10.9, AKI with sCr - 2.15 (now 1.96), albumin is <1.5, bilirubin in normal ranges at 0.9. CT Abdomen/Pelvis was concerning for possible small bowel obstructions, peritoneal carcinomatosis, ascites, and known ovarian mass.   Please note, it does appear per Dr Collie Siad note yesterday she did discuss role for getting palliative care/hospice involved this admission secondary to disease progression and overall poor prognosis.   Surgery is consulted by hospitalist provider Sharion Settler, NP in this context for evaluation and management of SBO in setting of malignancy.   PAST MEDICAL HISTORY (PMH):  Past Medical History:  Diagnosis Date   Anxiety    Arthritis    Ascites    Cancer associated pain    Cirrhosis of liver (HCC)    DDD (degenerative disc disease), cervical    Depression    Family history of breast cancer    GERD (gastroesophageal reflux disease)    Glaucoma    Headache    migraines   Hypercoagulable state ([place])    PE 03/2021   Hypertension    Ovarian cancer ([place]) 04/15/2021   PONV  (postoperative nausea and vomiting)    Pulmonary embolism ([place]) 04/12/2021   04/12/21 CTA: multiple bilateral lobar, segmental, and subsegmental PEs. Treated with Eliquis.   Sleep apnea    Venous stasis      PAST SURGICAL HISTORY ([place]):  Past Surgical History:  Procedure Laterality Date   ABDOMINAL HYSTERECTOMY N/A 2008   may have had left ovary removed   [place]     2012, 2015, 2020   COLONOSCOPY N/A 06/29/2021   Procedure: COLONOSCOPY;  Surgeon: Toledo, Benay Pike, MD;  Location: [facility] ENDOSCOPY;  Service: Gastroenterology;  Laterality: N/A;  DOCTORS ARE IN AGREEMENT THAT TOLEDO CAN DO THIS PROCEDURE IN RUSSO'S BLOCK   ESOPHAGOGASTRODUODENOSCOPY N/A 06/29/2021   Procedure: ESOPHAGOGASTRODUODENOSCOPY (EGD);  Surgeon: Toledo, Benay Pike, MD;  Location: [facility] ENDOSCOPY;  Service: Gastroenterology;  Laterality: N/A;   PARACENTESIS  04/20/2021   PILONIDAL CYST / SINUS EXCISION     PORTACATH PLACEMENT Right 06/01/2021   Procedure: INSERTION PORT-A-CATH;  Surgeon: Herbert Pun, MD;  Location: [facility] ORS;  Service: General;  Laterality: Right;     MEDICATIONS:  Prior to Admission medications   Medication Sig Start Date End Date Taking? Authorizing Provider  acetaminophen (TYLENOL) 500 MG tablet Take 1,000 mg by mouth at bedtime.   Yes [provider]  ELIQUIS 5 MG TABS tablet Take 1 tablet (5 mg total) by mouth 2 (two) times daily. 09/19/21  Yes  Earlie Server, MD  metoCLOPramide (REGLAN) 10 MG tablet Take 1 tablet (10 mg total) by mouth in the morning, at noon, and at bedtime for 14 days. 09/25/21 10/09/21 Yes Enzo Bi, MD  oxyCODONE (OXY IR/ROXICODONE) 5 MG immediate release tablet Take 1 tablet (5 mg total) by mouth every 6 (six) hours as needed for severe pain. 09/14/21  Yes Verlon Au, NP  albuterol (VENTOLIN HFA) 108 (90 Base) MCG/ACT inhaler Inhale 1-2 puffs into the lungs every 6 (six) hours as needed for wheezing  or shortness of breath. 03/03/21   [provider]  clonazePAM (KLONOPIN) 0.5 MG tablet Take 1 tablet (0.5 mg total) by mouth daily as needed for anxiety. 09/29/21   Earlie Server, MD  feeding supplement (ENSURE ENLIVE / ENSURE PLUS) LIQD Take 237 mLs by mouth 2 (two) times daily between meals. 09/08/21   Loletha Grayer, MD  lidocaine-prilocaine (EMLA) cream Apply 1 application topically daily as needed (pain). Home med 09/25/21   Enzo Bi, MD  prochlorperazine (COMPAZINE) 10 MG tablet Take 1 tablet (10 mg total) by mouth every 6 (six) hours as needed (Nausea or vomiting). Patient not taking: Reported on 10/03/2021 09/19/21   Earlie Server, MD     ALLERGIES:  Allergies  Allergen Reactions   Paclitaxel Shortness Of Breath and Other (See Comments)    pt flush, stated she didn't feel well, taxol stopped (05/10/2021)   Amlodipine Other (See Comments) and Rash    Not effective Not effective    Penicillins Rash     SOCIAL HISTORY:  Social History   Socioeconomic History   Marital status: Married    Spouse name: Barnabas Lister   Number of children: 2   Years of education: Not on file   Highest education level: Not on file  Occupational History   Not on file  Tobacco Use   Smoking status: Never   Smokeless tobacco: Never  Vaping Use   Vaping Use: Never used  Substance and Sexual Activity   Alcohol use: No   Drug use: No   Sexual activity: Not Currently    Birth control/protection: Surgical  Other Topics Concern   Not on file  Social History Narrative   Not on file   Social Determinants of Health   Financial Resource Strain: Not on file  Food Insecurity: Not on file  Transportation Needs: Not on file  Physical Activity: Not on file  Stress: Not on file  Social Connections: Not on file  Intimate Partner Violence: Not on file     FAMILY HISTORY:  Family History  Problem Relation Age of Onset   Breast cancer Mother 68   Breast cancer Maternal Aunt        d. under 22      REVIEW  OF SYSTEMS:  Review of Systems  Constitutional:  Positive for malaise/fatigue. Negative for chills and fever.  HENT:  Negative for congestion and sore throat.   Respiratory:  Negative for cough and shortness of breath.   Cardiovascular:  Negative for chest pain and palpitations.  Gastrointestinal:  Positive for abdominal pain, nausea and vomiting. Negative for constipation and diarrhea.  Genitourinary:  Negative for dysuria and urgency.  Neurological:  Positive for weakness. Negative for dizziness.  All other systems reviewed and are negative.  VITAL SIGNS:  Temp:  [98 F (36.7 C)-98.5 F (36.9 C)] 98.5 F (36.9 C) (01/19 0436) Pulse Rate:  [89-127] 89 (01/19 0436) Resp:  [15-19] 18 (01/19 0436) BP: (71-94)/(48-70) 90/60 (01/19 0436) SpO2:  [  91 %-99 %] 94 % (01/19 0436) Weight:  [79.7 kg] 79.7 kg (01/18 2317)     Height: 5\' 1"  (154.9 cm) Weight: 79.7 kg BMI (Calculated): 33.22   INTAKE/OUTPUT:  01/18 0701 - 01/19 0700 In: 55.6 [IV Piggyback:55.6] Out: -   PHYSICAL EXAM:  Physical Exam Vitals and nursing note reviewed. Exam conducted with a chaperone present.  Constitutional:      General: She is not in acute distress.    Appearance: Normal appearance. She is not ill-appearing.     Comments: Patient resting in bed, NAD, daughter at bedside  HENT:     Head: Normocephalic and atraumatic.  Eyes:     General: No scleral icterus.    Conjunctiva/sclera: Conjunctivae normal.  Cardiovascular:     Rate and Rhythm: Normal rate and regular rhythm.     Pulses: Normal pulses.     Heart sounds: No murmur heard. Pulmonary:     Effort: Pulmonary effort is normal. No respiratory distress.  Chest:     Comments: Port in right chest  Abdominal:     General: There is distension.     Palpations: Abdomen is soft.     Tenderness: There is abdominal tenderness. There is no guarding or rebound.  Genitourinary:    Comments: Deferred Neurological:     General: No focal deficit present.      Mental Status: She is alert and oriented to person, place, and time.  Psychiatric:        Mood and Affect: Affect is tearful.        Behavior: Behavior normal.     Comments: Appropriately tearful given nature of presentation      Labs:  CBC Latest Ref Rng & Units 10/04/2021 10/03/2021 10/03/2021  WBC 4.0 - 10.5 K/uL 24.5(H) 28.0(H) 25.5(H)  Hemoglobin 12.0 - 15.0 g/dL 9.3(L) 10.9(L) 10.9(L)  Hematocrit 36.0 - 46.0 % 27.3(L) 33.2(L) 32.7(L)  Platelets 150 - 400 K/uL 321 419(H) 411(H)   CMP Latest Ref Rng & Units 10/04/2021 10/03/2021 10/03/2021  Glucose 70 - 99 mg/dL 102(H) 159(H) 166(H)  BUN 8 - 23 mg/dL 41(H) 40(H) 38(H)  Creatinine 0.44 - 1.00 mg/dL 1.96(H) 2.15(H) 2.04(H)  Sodium 135 - 145 mmol/L 124(L) 123(L) 124(L)  Potassium 3.5 - 5.1 mmol/L 4.8 4.8 4.5  Chloride 98 - 111 mmol/L 88(L) 85(L) 85(L)  CO2 22 - 32 mmol/L 25 25 25   Calcium 8.9 - 10.3 mg/dL 8.7(L) 9.9 9.5  Total Protein 6.5 - 8.1 g/dL 5.0(L) - 6.1(L)  Total Bilirubin 0.3 - 1.2 mg/dL 0.9 - 0.6  Alkaline Phos 38 - 126 U/L 135(H) - 178(H)  AST 15 - 41 U/L 31 - 45(H)  ALT 0 - 44 U/L 16 - 21     Imaging studies:   CT Abdomen/Pelvis (10/04/2021) personally reviewed which does show dilated small bowel and stomach concerning for obstruction, significant ascites, large ovarian mass present, and radiologist report reviewed below;  IMPRESSION: 1. Findings consistent with a high-grade, proximal partial small bowel obstruction bowel obstruction. 2. Large amount of abdominopelvic ascites with numerous tiny soft tissue nodules scattered throughout the mesentery of the anterior mid to upper abdomen, consistent with peritoneal carcinomatosis. 3. Large, complex solid and cystic mass lesion extending from the right adnexa into the mid to lower right abdomen, consistent with the patient's history of ovarian cancer. 4. Mild atelectasis and/or early infiltrate within the bilateral lung bases. 5. Aortic  atherosclerosis.   Assessment/Plan: (ICD-10's: K24.609) 62 y.o. female with progressive weakness, nausea, and emesis  in setting of known ovarian cancer and ascites with new findings concerning for peritoneal carcinomatosis.    - This is a very unfortunate situation, and I do not feel there are any viable options from a surgical perspective. I spent a considerable amount of time with the patient and her daughter this morning. I educated them on her presentation and work up, and what that means. I do suspect her obstruction is partial in nature as she has been able to pass flatus and intermittently have bowel movements over the last few weeks. They understand that a malignant small bowel obstruction with peritoneal carcinomatosis is a very difficult etiology. I explained that often times surgical intervention is not recommended as this can result in significant complications (ex: enterotomies, fistulas, wound dehiscence, evisceration, malnutrition, debilitating pain) and does not treat the underlying malignancy. Additionally, in the setting of her ascites (although not necessarily from cirrhosis), she is a borderline Childs B/C which carries a 30-82% of perioperative mortality. They seemed to be understanding of this. The only potential option I can imagine would be potentially placing palliative NGT for symptom management, which would allow her to eat around this and intermittently connect to suction for nausea control. However, the discomfort of this alone may limit her willingness to do so. Daughter asked about indwelling drain for her ascites, which is reasonable for symptom control, but I will defer decision to medicine. I do feel that palliative care should be involved at this point. I explained their role in helping determining patient and family's goals of care and providing them with the appropriate resources. I explained that in cases like this, quality of life is important, and any surgical  intervention would certainly take that away. They seemed very understanding of our discussion. I allowed them multiple times to ask questions and for silent reflection. At the end of our discussion, I believe they were very understanding of the situation, and all their questions were addressed and answered  I will sign off for now, but I will be readily available and happy to assist in anyway needed.   Face-to-face time spent with the patient and care providers was 80 minutes, with more than 50% of the time spent counseling, educating, and coordinating care of the patient.    Thank you for the opportunity to participate in this patient's care.   -- Edison Simon, PA-C [facility] 10/04/2021, 7:30 AM [phone] M-F: 7am - 4pm

## 2021-10-04 NOTE — Progress Notes (Signed)
*  PRELIMINARY RESULTS* Echocardiogram 2D Echocardiogram has been performed.  Brittany Montoya Jamont Mellin 10/04/2021, 11:24 AM

## 2021-10-04 NOTE — Progress Notes (Signed)
Pharmacy Antibiotic Note  Brittany Montoya is a 61 y.o. female admitted on 10/03/2021 with sepsis.  Pharmacy has been consulted for Vancomycin , meropenem dosing.  Plan: Meropenem 1 gm IV Q12H ordered to start on 1/19 @ ~ 0200.  Vancomycin 1750 mg IV X 1 ordered for 1/19 @ ~ 0200.  Vancomycin 750 mg IV Q48H ordered to start on 1/21 @ 0200.  AUC = 435.5 Vanc trough = 10.6   Height: 5\' 1"  (154.9 cm) Weight: 79.7 kg (175 lb 11.3 oz) IBW/kg (Calculated) : 47.8  Temp (24hrs), Avg:98.2 F (36.8 C), Min:98 F (36.7 C), Max:98.4 F (36.9 C)  Recent Labs  Lab 10/03/21 1435 10/03/21 1604 10/03/21 2224 10/04/21 0024  WBC 25.5* 28.0*  --   --   CREATININE 2.04* 2.15*  --   --   LATICACIDVEN  --   --  1.7 2.1*    Estimated Creatinine Clearance: 26.3 mL/min (A) (by C-G formula based on SCr of 2.15 mg/dL (H)).    Allergies  Allergen Reactions   Paclitaxel Shortness Of Breath and Other (See Comments)    pt flush, stated she didn't feel well, taxol stopped (05/10/2021)   Amlodipine Other (See Comments) and Rash    Not effective Not effective    Penicillins Rash    Antimicrobials this admission:   >>    >>   Dose adjustments this admission:   Microbiology results:  BCx:   UCx:    Sputum:    MRSA PCR:   Thank you for allowing pharmacy to be a part of this patients care.  Hildred Mollica D 10/04/2021 2:00 AM

## 2021-10-04 NOTE — Progress Notes (Signed)
PROGRESS NOTE  Brittany Montoya    DOB: [birth year], 61 y.o.  PZW:[id number]  PCP: Adin Hector, MD   Code Status: Full Code   DOA: 10/03/2021   LOS: 0  Brief Narrative of Current Hospitalization  Brittany Montoya is a 61 y.o. female with a PMH significant for HTN, glaucoma, OSA, metastatic ovarian cancer, h/o PE, anemia, CKDIII. They presented from home to the ED on 10/03/2021 with N/V which has been chronic in setting of chemotherapy and advanced ovarian cancer but has gotten acutely worse in past week. In the ED, it was found that they had high grade SBO on abdominal CT. They were treated with analgesia and antiemetics PRN. She was kept NPO and general surgery was called for evaluation.  Patient was admitted to medicine service for further workup and management of SBO as outlined in detail below.  10/04/21 -stable UPDATE: patient and family have decided to pursue hospice at home.  Assessment & Plan  Principal Problem:   Nausea and vomiting Active Problems:   Essential hypertension   Glaucoma (increased eye pressure)   Obstructive sleep apnea syndrome   Ovarian cancer ([place])   Pulmonary embolus (HCC)   Anemia   Sepsis (HCC)   Hyponatremia   Acute renal failure superimposed on stage 3a chronic kidney disease (HCC)  High grade SBO as seen on abdominal CT   intractable N/V- patient states that she had last BM approximately 3 days ago which can be normal for her.  - antiemetics PRN - NPO - surgery consulted  Ovarian cancer/ascites- See Dr. Tasia Catchings clinic note from 1/18. Patient has decided to pursue hospice care.  - IR consulted for tunneled peritoneal catheter for therapeutic paracentesis at home - Dr. Tasia Catchings following, appreciate recs  Hypotension with h/o HTN- responsive to IV fluid boluses. Patient is asymptomatic. Consider midodrine if continues to be low to avoid fluid overload.  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 Respiratory status stable. - currently on lovenox  Hyponatremia- chronic, stable. Na [id number]. Patient is tired but has multiple reasons for this so unsure of hyponatremia contribution.  - continue IV fluids.  - consider salt tablets.  - BMP q8hr   AKI on CKD- difficult to assess baseline Cr. Cr 2.04>2.15>1.96 - continue to monitor   OSA- Cont with cpap.   Glaucoma- Cont with home eye drops  DVT prophylaxis: apixaban (ELIQUIS) tablet 2.5 mg Start: 10/03/21 2200 SCDs Start: 10/03/21 1855 apixaban (ELIQUIS) tablet 2.5 mg   Diet:  Diet Orders (From admission, onward)     Start     Ordered   10/04/21 0441  Diet NPO time specified Except for: Ice Chips  Diet effective now       Question:  Except for  Answer:  Ice Chips   10/04/21 0440            Subjective 10/04/21    Pt reports feeling nausea and no appetite. Her abdomen feels full and tender. Has not had BM in about 3 days but does typically have constipation. Is passing gas.  Disposition Plan & Communication  Patient status: Observation  Admitted From: Home Disposition: home hospice Anticipated discharge date: TBD  Family Communication: daughter at bedside  Consults, Procedures, Significant Events  Consultants:  General surgery Palliative Hospice IR  Procedures/significant events:  None  Antimicrobials:  Anti-infectives (From admission, onward)    Start     Dose/Rate Route Frequency Ordered Stop  10/06/21 0300  vancomycin (VANCOREADY) IVPB 750 mg/150 mL        750 mg 150 mL/hr over 60 Minutes Intravenous Every 48 hours 10/04/21 0148     10/04/21 0245  meropenem (MERREM) 1 g in sodium chloride 0.9 % 100 mL IVPB        1 g 200 mL/hr over 30 Minutes Intravenous Every 12 hours 10/04/21 0159         Objective   Vitals:   10/03/21 2317 10/04/21 0100 10/04/21 0436 10/04/21 0731  BP:  94/62 90/60 116/75  Pulse:  99 89 99  Resp:  18 18 18   Temp:   98.5 F (36.9 C) 98.2 F (36.8 C)   TempSrc:   Oral Oral  SpO2:  99% 94% 100%  Weight: 79.7 kg     Height: 5\' 1"  (1.549 m)       Intake/Output Summary (Last 24 hours) at 10/04/2021 0741 Last data filed at 10/04/2021 0300 Gross per 24 hour  Intake 55.57 ml  Output --  Net 55.57 ml   Filed Weights   10/03/21 2317  Weight: 79.7 kg    Patient BMI: Body mass index is 33.2 kg/m.   Physical Exam:  General: awake, alert, NAD HEENT: atraumatic, clear conjunctiva, anicteric sclera, MMM, hearing grossly normal Respiratory: normal respiratory effort. Cardiovascular: normal S1/S2, RRR, no JVD, murmurs, quick capillary refill  Gastrointestinal: distended, soft. Endorses tenderness diffusely but no rebound or guarding Nervous: A&O x3. no gross focal neurologic deficits, normal speech Extremities: moves all equally, no edema, normal tone Skin: dry, intact, normal temperature, normal color. No rashes, lesions or ulcers on exposed skin Psychiatry: normal mood, congruent affect  Labs   I have personally reviewed following labs and imaging studies Admission on 10/03/2021  Component Date Value Ref Range Status   Sodium 10/03/2021 123 (L)  135 - 145 mmol/L Final   Potassium 10/03/2021 4.8  3.5 - 5.1 mmol/L Final   Chloride 10/03/2021 85 (L)  98 - 111 mmol/L Final   CO2 10/03/2021 25  22 - 32 mmol/L Final   Glucose, Bld 10/03/2021 159 (H)  70 - 99 mg/dL Final   BUN 10/03/2021 40 (H)  8 - 23 mg/dL Final   Creatinine, Ser 10/03/2021 2.15 (H)  0.44 - 1.00 mg/dL Final   Calcium 10/03/2021 9.9  8.9 - 10.3 mg/dL Final   GFR, Estimated 10/03/2021 26 (L)  >60 mL/min Final   Anion gap 10/03/2021 13  5 - 15 Final   WBC 10/03/2021 28.0 (H)  4.0 - 10.5 K/uL Final   RBC 10/03/2021 3.48 (L)  3.87 - 5.11 MIL/uL Final   Hemoglobin 10/03/2021 10.9 (L)  12.0 - 15.0 g/dL Final   HCT 10/03/2021 33.2 (L)  36.0 - 46.0 % Final   MCV 10/03/2021 95.4  80.0 - 100.0 fL Final   MCH 10/03/2021 31.3  26.0 - 34.0 pg Final   MCHC 10/03/2021 32.8  30.0 -  36.0 g/dL Final   RDW 10/03/2021 17.6 (H)  11.5 - 15.5 % Final   Platelets 10/03/2021 419 (H)  150 - 400 K/uL Final   nRBC 10/03/2021 0.0  0.0 - 0.2 % Final   Color, Urine 10/04/2021 AMBER (A)  YELLOW Final   APPearance 10/04/2021 HAZY (A)  CLEAR Final   Specific Gravity, Urine 10/04/2021 1.020  1.005 - 1.030 Final   pH 10/04/2021 5.0  5.0 - 8.0 Final   Glucose, UA 10/04/2021 NEGATIVE  NEGATIVE mg/dL Final   Hgb urine dipstick  10/04/2021 SMALL (A)  NEGATIVE Final   Bilirubin Urine 10/04/2021 NEGATIVE  NEGATIVE Final   Ketones, ur 10/04/2021 NEGATIVE  NEGATIVE mg/dL Final   Protein, ur 10/04/2021 NEGATIVE  NEGATIVE mg/dL Final   Nitrite 10/04/2021 NEGATIVE  NEGATIVE Final   Leukocytes,Ua 10/04/2021 TRACE (A)  NEGATIVE Final   RBC / HPF 10/04/2021 0-5  0 - 5 RBC/hpf Final   WBC, UA 10/04/2021 21-50  0 - 5 WBC/hpf Final   Bacteria, UA 10/04/2021 NONE SEEN  NONE SEEN Final   Squamous Epithelial / LPF 10/04/2021 0-5  0 - 5 Final   Mucus 10/04/2021 PRESENT   Final   Hyaline Casts, UA 10/04/2021 PRESENT   Final   SARS Coronavirus 2 by RT PCR 10/03/2021 NEGATIVE  NEGATIVE Final   Influenza A by PCR 10/03/2021 NEGATIVE  NEGATIVE Final   Influenza B by PCR 10/03/2021 NEGATIVE  NEGATIVE Final   Prothrombin Time 10/03/2021 18.6 (H)  11.4 - 15.2 seconds Final   INR 10/03/2021 1.6 (H)  0.8 - 1.2 Final   Lactic Acid, Venous 10/03/2021 1.7  0.5 - 1.9 mmol/L Final   Magnesium 10/03/2021 1.5 (L)  1.7 - 2.4 mg/dL Final   Troponin I (High Sensitivity) 10/03/2021 22 (H)  <18 ng/L Final   Troponin I (High Sensitivity) 10/03/2021 25 (H)  <18 ng/L Final   Glucose-Capillary 10/03/2021 105 (H)  70 - 99 mg/dL Final   aPTT 10/03/2021 41 (H)  24 - 36 seconds Final   Lactic Acid, Venous 10/04/2021 2.1 (HH)  0.5 - 1.9 mmol/L Final   Sodium 10/04/2021 124 (L)  135 - 145 mmol/L Final   Potassium 10/04/2021 4.8  3.5 - 5.1 mmol/L Final   Chloride 10/04/2021 88 (L)  98 - 111 mmol/L Final   CO2 10/04/2021 25  22 - 32  mmol/L Final   Glucose, Bld 10/04/2021 102 (H)  70 - 99 mg/dL Final   BUN 10/04/2021 41 (H)  8 - 23 mg/dL Final   Creatinine, Ser 10/04/2021 1.96 (H)  0.44 - 1.00 mg/dL Final   Calcium 10/04/2021 8.7 (L)  8.9 - 10.3 mg/dL Final   GFR, Estimated 10/04/2021 29 (L)  >60 mL/min Final   Anion gap 10/04/2021 11  5 - 15 Final   Lactic Acid, Venous 10/04/2021 1.1  0.5 - 1.9 mmol/L Final   WBC 10/04/2021 24.5 (H)  4.0 - 10.5 K/uL Final   RBC 10/04/2021 2.93 (L)  3.87 - 5.11 MIL/uL Final   Hemoglobin 10/04/2021 9.3 (L)  12.0 - 15.0 g/dL Final   HCT 10/04/2021 27.3 (L)  36.0 - 46.0 % Final   MCV 10/04/2021 93.2  80.0 - 100.0 fL Final   MCH 10/04/2021 31.7  26.0 - 34.0 pg Final   MCHC 10/04/2021 34.1  30.0 - 36.0 g/dL Final   RDW 10/04/2021 17.3 (H)  11.5 - 15.5 % Final   Platelets 10/04/2021 321  150 - 400 K/uL Final   nRBC 10/04/2021 0.0  0.0 - 0.2 % Final   Neutrophils Relative % 10/04/2021 94  % Final   Neutro Abs 10/04/2021 23.0 (H)  1.7 - 7.7 K/uL Final   Lymphocytes Relative 10/04/2021 2  % Final   Lymphs Abs 10/04/2021 0.6 (L)  0.7 - 4.0 K/uL Final   Monocytes Relative 10/04/2021 3  % Final   Monocytes Absolute 10/04/2021 0.8  0.1 - 1.0 K/uL Final   Eosinophils Relative 10/04/2021 0  % Final   Eosinophils Absolute 10/04/2021 0.0  0.0 - 0.5 K/uL  Final   Basophils Relative 10/04/2021 0  % Final   Basophils Absolute 10/04/2021 0.0  0.0 - 0.1 K/uL Final   Immature Granulocytes 10/04/2021 1  % Final   Abs Immature Granulocytes 10/04/2021 0.13 (H)  0.00 - 0.07 K/uL Final   MRSA by PCR Next Gen 10/04/2021 NOT DETECTED  NOT DETECTED Final   Procalcitonin 10/04/2021 2.14  ng/mL Final   Lactic Acid, Venous 10/04/2021 1.2  0.5 - 1.9 mmol/L Final   Total Protein 10/04/2021 5.0 (L)  6.5 - 8.1 g/dL Final   Albumin 10/04/2021 <1.5 (L)  3.5 - 5.0 g/dL Final   AST 10/04/2021 31  15 - 41 U/L Final   ALT 10/04/2021 16  0 - 44 U/L Final   Alkaline Phosphatase 10/04/2021 135 (H)  38 - 126 U/L Final    Total Bilirubin 10/04/2021 0.9  0.3 - 1.2 mg/dL Final   Bilirubin, Direct 10/04/2021 0.3 (H)  0.0 - 0.2 mg/dL Final   Indirect Bilirubin 10/04/2021 0.6  0.3 - 0.9 mg/dL Final    Imaging Studies  CT ABDOMEN PELVIS WO CONTRAST  Result Date: 10/04/2021 CLINICAL DATA:  History of ovarian cancer with mid abdominal pain. EXAM: CT ABDOMEN AND PELVIS WITHOUT CONTRAST TECHNIQUE: Multidetector CT imaging of the abdomen and pelvis was performed following the standard protocol without IV contrast. RADIATION DOSE REDUCTION: This exam was performed according to the departmental dose-optimization program which includes automated exposure control, adjustment of the mA and/or kV according to patient size and/or use of iterative reconstruction technique. COMPARISON:  August 31, 2021 FINDINGS: Lower chest: Mild atelectasis and/or early infiltrate is seen within the bilateral lung bases. Hepatobiliary: No focal liver abnormality is seen. Status post cholecystectomy. No biliary dilatation. Pancreas: Unremarkable. No pancreatic ductal dilatation or surrounding inflammatory changes. Spleen: Normal in size without focal abnormality. Adrenals/Urinary Tract: Adrenal glands are unremarkable. Kidneys are normal, without renal calculi, focal lesion, or hydronephrosis. Bladder is unremarkable. Stomach/Bowel: Stomach is within normal limits. Appendix appears normal. Numerous markedly dilated proximal small bowel loops are seen within the mid and upper left abdomen (the largest measures approximately 4.7 cm in caliber). A transition zone is seen within the anterior aspect of the mid abdomen (axial CT images 50 through 56, CT series 2). Vascular/Lymphatic: Aortic atherosclerosis. No enlarged abdominal or pelvic lymph nodes. Reproductive: A very large, approximately 15.6 cm x 11.9 cm x 15.3 cm partially calcified, complex solid and cystic mass lesion is again seen extending from the right adnexa into the mid to lower right abdomen. Other:  No abdominal wall hernia or abnormality. A large amount of abdominopelvic ascites is seen (approximately 7.29 Hounsfield units). Numerous tiny soft tissue nodules are seen scattered throughout the mesentery of the anterior mid to upper abdomen (axial CT images 38 through 46, CT series 2). Musculoskeletal: No acute or significant osseous findings. IMPRESSION: 1. Findings consistent with a high-grade, proximal partial small bowel obstruction bowel obstruction. 2. Large amount of abdominopelvic ascites with numerous tiny soft tissue nodules scattered throughout the mesentery of the anterior mid to upper abdomen, consistent with peritoneal carcinomatosis. 3. Large, complex solid and cystic mass lesion extending from the right adnexa into the mid to lower right abdomen, consistent with the patient's history of ovarian cancer. 4. Mild atelectasis and/or early infiltrate within the bilateral lung bases. 5. Aortic atherosclerosis. Aortic Atherosclerosis (ICD10-I70.0). Electronically Signed   By: Virgina Norfolk M.D.   On: 10/04/2021 02:49   CT CHEST WO CONTRAST  Result Date: 10/04/2021 CLINICAL DATA:  Chest pain and shortness of breath. EXAM: CT CHEST WITHOUT CONTRAST TECHNIQUE: Multidetector CT imaging of the chest was performed following the standard protocol without IV contrast. RADIATION DOSE REDUCTION: This exam was performed according to the departmental dose-optimization program which includes automated exposure control, adjustment of the mA and/or kV according to patient size and/or use of iterative reconstruction technique. COMPARISON:  June 25, 2021 FINDINGS: Cardiovascular: There is stable right-sided venous Port-A-Cath positioning. No significant vascular findings. Normal heart size. No pericardial effusion. Mediastinum/Nodes: No enlarged mediastinal or axillary lymph nodes. Heterogeneous material is seen throughout the lumen of a mildly distended esophagus. The thyroid gland and trachea demonstrate no  significant findings. Lungs/Pleura: Mild to moderate severity atelectasis and/or infiltrate is seen within the inferior aspect of the right upper lobe and posterior aspects of the bilateral lower lobes. This is new when compared to the prior study. There is no evidence of a pleural effusion or pneumothorax. Upper Abdomen: Moderate severity free fluid is seen within the abdomen. This is markedly decreased in severity within the perihepatic region and increased in severity within the anterior abdomen and anterolateral left upper quadrant. Surgical clips are noted within the gallbladder fossa. Markedly dilated small bowel loops are suspected within the left upper quadrant. Musculoskeletal: No chest wall mass or suspicious bone lesions identified. IMPRESSION: 1. Mild to moderate severity areas of right upper lobe and bilateral lower lobe atelectasis and/or infiltrate. 2. Moderate severity free fluid within the abdomen. This is markedly decreased in severity within the perihepatic region and increased in severity within the anterior abdomen and anterolateral left upper quadrant. 3. Findings suspicious for the presence of a small bowel obstruction. Correlation with abdomen pelvis CT is recommended. Electronically Signed   By: Virgina Norfolk M.D.   On: 10/04/2021 01:03    Medications   Scheduled Meds:  apixaban  2.5 mg Oral BID   pantoprazole (PROTONIX) IV  40 mg Intravenous Q12H   sodium chloride flush  3 mL Intravenous Q12H   No recently discontinued medications to reconcile  LOS: 0 days   Richarda Osmond, DO [facility] 10/04/2021, 7:41 AM   Available by Epic secure chat 7AM-7PM. If 7PM-7AM, please contact night-coverage Refer to [email or web address] to contact the [facility] Attending or Consulting provider for this pt

## 2021-10-04 NOTE — Progress Notes (Signed)
[facility]) Hospital Liaison RN note Received request from [facility] for hospice services at home after discharge. Chart and patient information under review by Hospice physician. Hospice eligibility has been confirmed.  Spoke with patient and family in room to initiate education related to hospice philosophy, services, and team approach to care. Patient/family verbalized understanding of information given. Per discussion, the plan is for discharge home by EMS after drain placement.  DME needs discussed. Patient has the following equipment in the home. None Patient/family requests the following equipment for deliver: hospital bed, overbed table, rollator and shower bench. Address has been verified. Barnabas Lister at [phone] is the family contact to arrange time of equipment delivery.  Please send signed and completed DNR with patient/family. Please provide symptoms at discharge as needed for ongoing symptom management.  [facility] information and contact numbers given to patient's daughter Kyra Searles. Above information shared with [place].  Please call with any hospice related questions or concerns.  Thank you for the opportunity to participate in this patient's care.  Jhonnie Garner, Therapist, sports, BSN [employer] [phone]

## 2021-10-04 NOTE — Progress Notes (Addendum)
Date and time results received: 10/04/21 0118 Reported by Rolan Bucco, Lab Reported to: Grayce Sessions, RN   Test: Lactic Acid Critical Value: 2.1  Name of Provider Notified: Rachael Fee   Orders Received? Or Actions Taken?:  MD notified,  will await possible new orders

## 2021-10-04 NOTE — Progress Notes (Signed)
°   10/04/21 1143  Assess: MEWS Score  Temp 98.1 F (36.7 C)  BP (!) 77/56  Pulse Rate 89  SpO2 93 %  Assess: MEWS Score  MEWS Temp 0  MEWS Systolic 2  MEWS Pulse 0  MEWS RR 0  MEWS LOC 0  MEWS Score 2  MEWS Score Color Yellow  Assess: if the MEWS score is Yellow or Red  Were vital signs taken at a resting state? Yes  Focused Assessment Change from prior assessment (see assessment flowsheet)  Does the patient meet 2 or more of the SIRS criteria? No  Treat  MEWS Interventions Escalated (See documentation below)  Pain Scale 0-10  Pain Score 0  Take Vital Signs  Increase Vital Sign Frequency  Yellow: Q 2hr X 2 then Q 4hr X 2, if remains yellow, continue Q 4hrs  Escalate  MEWS: Escalate Yellow: discuss with charge nurse/RN and consider discussing with provider and RRT  Notify: Charge Nurse/RN  Name of Charge Nurse/RN Notified Siri Cole  Date Charge Nurse/RN Notified 10/04/21  Time Charge Nurse/RN Notified 1145  Notify: Provider  Provider Name/Title Dr Ouida Sills  Date Provider Notified 10/04/21  Time Provider Notified 1145  Notification Type Face-to-face  Notification Reason Change in status (low BP)  Provider response See new orders  Date of Provider Response 10/04/21  Time of Provider Response 1146  Document  Progress note created (see row info) Yes  Assess: SIRS CRITERIA  SIRS Temperature  0  SIRS Pulse 0  SIRS Respirations  0  SIRS WBC 1  SIRS Score Sum  1

## 2021-10-04 NOTE — Progress Notes (Signed)
Cross Cover CT chest W/O contrast report called to me  IMPRESSION: 1. Mild to moderate severity areas of right upper lobe and bilateral lower lobe atelectasis and/or infiltrate. 2. Moderate severity free fluid within the abdomen. This is markedly decreased in severity within the perihepatic region and increased in severity within the anterior abdomen and anterolateral left upper quadrant. 3. Findings suspicious for the presence of a small bowel obstruction. Correlation with abdomen pelvis CT is recommended.   - aspiration pneumonia is suspected. F/u CT abdomen ordered as f/u per recommendation.  Lactic acid now at 2.1. meropenem and vanc initiated. Will d/c vanc if MRSA screen negative. Additional fluid bolus had already been given with arrival on floor for continued hypotension - thus far 1150 received - additional 1220 ordered per sepsis protocol Mag 1.5 on admit - not supplemented prior - repeat level due 0830   CT abdomen pelvis result called to me [id number] from [facility] radiology IMPRESSION: 1. Findings consistent with a high-grade, proximal partial small bowel obstruction bowel obstruction. 2. Large amount of abdominopelvic ascites with numerous tiny soft tissue nodules scattered throughout the mesentery of the anterior mid to upper abdomen, consistent with peritoneal carcinomatosis. 3. Large, complex solid and cystic mass lesion extending from the right adnexa into the mid to lower right abdomen, consistent with the patient's history of ovarian cancer. 4. Mild atelectasis and/or early infiltrate within the bilateral lung bases. 5. Aortic atherosclerosis.  -NGT if vomiting returns, will need gen surg consult -peritoneal metastatic lesions new finding from prior CT 12/22 Findings not discussed with patient due to timing of receipt of report, patient asleep. Routine consult placed in Epic to [place] oncologist, Dr. Tasia Catchings placed -continue IV fluid support -pain control

## 2021-10-04 NOTE — Consult Note (Addendum)
Hematology/Oncology Consult note [phone] [phone]  Patient Care Team: Adin Hector, MD as PCP - General (Internal Medicine) Earlie Server, MD as Consulting Physician (Hematology and Oncology) Clent Jacks, RN as Oncology Nurse Navigator   Name of the patient: Brittany Montoya  [id number]  Jan 02, 1961   Date of visit: 10/04/21 REASON FOR COSULTATION:  Ovarian cancer History of presenting illness-  61 y.o. female with PMH listed at below who presents to ER for evaluation of intractable nausea vomiting, abdominal discomfort, ovarian cancer.  She was seen by me in the office and sent to emergency room for further evaluation  10/04/2021, CT abdomen pelvis showed high-grade proximal partial small bowel obstruction, large amount of ascites with numerous tiny soft tissue nodules scattered throughout the mesentery of the anterior mid to upper abdomen.  Consistent with peritoneal carcinomatosis.  Large complex solid cystic mass lesion.  Mild atelectasis and/or early infiltrates within the bilateral lung base.  Aortic atherosclerosis. CT chest showed mild to moderately severity areas of right upper lobe and bilateral lower lobe atelectasis/infiltrates. Patient was admitted for management of small bowel obstruction, ascites.  AKI treatments. She is NPO, received  supportive care IV antiemetics.  Echocardiogram has been obtained and result is pending.   Review of Systems  Constitutional:  Positive for appetite change, fatigue and unexpected weight change. Negative for chills and fever.  HENT:   Negative for hearing loss and voice change.   Eyes:  Negative for eye problems.  Respiratory:  Negative for chest tightness and cough.   Cardiovascular:  Negative for chest pain.  Gastrointestinal:  Positive for abdominal distention, nausea and vomiting. Negative for abdominal pain and blood in stool.  Endocrine: Negative for hot flashes.  Genitourinary:  Negative for  difficulty urinating and frequency.   Musculoskeletal:  Negative for arthralgias.  Skin:  Negative for itching and rash.  Neurological:  Negative for extremity weakness.  Hematological:  Negative for adenopathy.  Psychiatric/Behavioral:  Negative for confusion.    Allergies  Allergen Reactions   Paclitaxel Shortness Of Breath and Other (See Comments)    pt flush, stated she didn't feel well, taxol stopped (05/10/2021)   Amlodipine Other (See Comments) and Rash    Not effective Not effective    Penicillins Rash    Patient Active Problem List   Diagnosis Date Noted   Small bowel obstruction ([place]) 10/04/2021   Neoplasm related pain 10/03/2021   Nausea and vomiting 10/03/2021   Palliative care encounter    Acute kidney injury ([place]) 09/23/2021   Hypomagnesemia 09/21/2021   Hypercoagulable state ([place]) 09/21/2021   Hypercalcemia 09/21/2021   Hyperphosphatemia 09/21/2021   Acute renal failure superimposed on stage 3a chronic kidney disease ([place]) 09/07/2021   Abdominal distension 09/07/2021   Hypoalbuminemia due to protein-calorie malnutrition ([place]) 09/07/2021   Hypokalemia 09/07/2021   Transaminitis 09/07/2021   Obesity (BMI 30-39.9) 09/07/2021   Leukocytosis 09/07/2021   Intractable nausea and vomiting    Abdominal pain 08/31/2021   Anemia 08/31/2021   Thrombocytopenia ([place]) 08/31/2021   Sepsis ([place]) 08/31/2021   Hyponatremia 08/31/2021   SBP (spontaneous bacterial peritonitis) ([place]) 08/31/2021   Genetic testing 07/10/2021   Macrocytic anemia 07/05/2021   Chemotherapy induced neutropenia ([place]) 06/15/2021   Constipation, chronic 05/17/2021   Carcinoma of ovary, stage 3, right ([place]) 05/17/2021   Portal venous hypertension ([place]) 05/17/2021   Infusion reaction 05/10/2021   Family history of breast cancer 04/27/2021   Goals of care, counseling/discussion 04/19/2021   Pulmonary embolus (  [place]) 04/19/2021   Encounter for antineoplastic chemotherapy 04/19/2021   Depression with  anxiety 04/19/2021   Ovarian cancer ([place]) 04/15/2021   Malignant ascites 04/11/2021   Mass of right ovary 03/26/2021   DDD (degenerative disc disease), cervical 03/02/2019   Glaucoma (increased eye pressure) 03/02/2019   Venous stasis 03/02/2019   Other hyperlipidemia 01/21/2019   Facet arthritis of cervical region 04/01/2016   Incomplete tear of left rotator cuff 01/15/2016   Rotator cuff tendinitis, left 01/15/2016   Essential hypertension 10/02/2015   Obstructive sleep apnea syndrome 10/02/2015   Recurrent major depressive disorder, in full remission ([place]) 10/02/2015   Contusion of left knee 10/17/2014     Past Medical History:  Diagnosis Date   Anxiety    Arthritis    Ascites    Cancer associated pain    Cirrhosis of liver (HCC)    DDD (degenerative disc disease), cervical    Depression    Family history of breast cancer    GERD (gastroesophageal reflux disease)    Glaucoma    Headache    migraines   Hypercoagulable state ([place])    PE 03/2021   Hypertension    Ovarian cancer ([place]) 04/15/2021   PONV (postoperative nausea and vomiting)    Pulmonary embolism ([place]) 04/12/2021   04/12/21 CTA: multiple bilateral lobar, segmental, and subsegmental PEs. Treated with Eliquis.   Sleep apnea    Venous stasis      Past Surgical History:  Procedure Laterality Date   ABDOMINAL HYSTERECTOMY N/A 2008   may have had left ovary removed   [place]     2012, 2015, 2020   COLONOSCOPY N/A 06/29/2021   Procedure: COLONOSCOPY;  Surgeon: Toledo, Benay Pike, MD;  Location: [facility] ENDOSCOPY;  Service: Gastroenterology;  Laterality: N/A;  DOCTORS ARE IN AGREEMENT THAT TOLEDO CAN DO THIS PROCEDURE IN RUSSO'S BLOCK   ESOPHAGOGASTRODUODENOSCOPY N/A 06/29/2021   Procedure: ESOPHAGOGASTRODUODENOSCOPY (EGD);  Surgeon: Toledo, Benay Pike, MD;  Location: [facility] ENDOSCOPY;  Service: Gastroenterology;  Laterality: N/A;   PARACENTESIS   04/20/2021   PILONIDAL CYST / SINUS EXCISION     PORTACATH PLACEMENT Right 06/01/2021   Procedure: INSERTION PORT-A-CATH;  Surgeon: Herbert Pun, MD;  Location: [facility] ORS;  Service: General;  Laterality: Right;    Social History   Socioeconomic History   Marital status: Married    Spouse name: Barnabas Lister   Number of children: 2   Years of education: Not on file   Highest education level: Not on file  Occupational History   Not on file  Tobacco Use   Smoking status: Never   Smokeless tobacco: Never  Vaping Use   Vaping Use: Never used  Substance and Sexual Activity   Alcohol use: No   Drug use: No   Sexual activity: Not Currently    Birth control/protection: Surgical  Other Topics Concern   Not on file  Social History Narrative   Not on file   Social Determinants of Health   Financial Resource Strain: Not on file  Food Insecurity: Not on file  Transportation Needs: Not on file  Physical Activity: Not on file  Stress: Not on file  Social Connections: Not on file  Intimate Partner Violence: Not on file     Family History  Problem Relation Age of Onset   Breast cancer Mother 71   Breast cancer Maternal Aunt        d. under  50     Current Facility-Administered Medications:    0.9 %  sodium chloride infusion, , Intravenous, Once, Richarda Osmond, MD, Last Rate: 150 mL/hr at 10/04/21 1445, Restarted at 10/04/21 1445   albuterol (PROVENTIL) (2.5 MG/3ML) 0.083% nebulizer solution 3 mL, 3 mL, Inhalation, Q6H PRN, Richarda Osmond, MD   [START ON 10/05/2021] Chlorhexidine Gluconate Cloth 2 % PADS 6 each, 6 each, Topical, Q0600, Richarda Osmond, MD   clonazePAM (KLONOPIN) tablet 0.5 mg, 0.5 mg, Oral, Daily PRN, Richarda Osmond, MD   enoxaparin (LOVENOX) injection 80 mg, 1 mg/kg, Subcutaneous, Daily, Richarda Osmond, MD, 80 mg at 10/04/21 1407   HYDROmorphone (DILAUDID) injection 0.5 mg, 0.5 mg, Intravenous, Q6H PRN, Richarda Osmond, MD, 0.5 mg at  10/04/21 1035   metoCLOPramide (REGLAN) tablet 10 mg, 10 mg, Oral, Q8H PRN, Richarda Osmond, MD   ondansetron [facility]) injection 4 mg, 4 mg, Intravenous, Q6H PRN, Sharion Settler, NP, 4 mg at 10/03/21 2149   oxyCODONE (Oxy IR/ROXICODONE) immediate release tablet 5 mg, 5 mg, Oral, Q6H PRN, Richarda Osmond, MD   pantoprazole (PROTONIX) injection 40 mg, 40 mg, Intravenous, Q12H, Florina Ou V, MD, 40 mg at 10/04/21 0801   prochlorperazine (COMPAZINE) tablet 10 mg, 10 mg, Oral, Q6H PRN, Richarda Osmond, MD   sodium chloride flush (NS) 0.9 % injection 3 mL, 3 mL, Intravenous, Q12H, Florina Ou V, MD, 3 mL at 10/04/21 4081  Facility-Administered Medications Ordered in Other Encounters:    heparin lock flush 100 unit/mL, 500 Units, Intravenous, Once, Earlie Server, MD   LORazepam (ATIVAN) injection 0.5 mg, 0.5 mg, Intravenous, Once PRN, Earlie Server, MD   sodium chloride flush (NS) 0.9 % injection 10 mL, 10 mL, Intravenous, PRN, Earlie Server, MD, 10 mL at 09/21/21 0849   Physical exam:  Vitals:   10/04/21 0731 10/04/21 1140 10/04/21 1143 10/04/21 1405  BP: 116/75 (!) 77/49 (!) 77/56 (!) 96/57  Pulse: 99 89 89 93  Resp: 18 18  18   Temp: 98.2 F (36.8 C) 98.1 F (36.7 C) 98.1 F (36.7 C) 98.3 F (36.8 C)  TempSrc: Oral Oral  Oral  SpO2: 100% 94% 93% 91%  Weight:      Height:       Physical Exam      CMP Latest Ref Rng & Units 10/04/2021  Glucose 70 - 99 mg/dL 102(H)  BUN 8 - 23 mg/dL 41(H)  Creatinine 0.44 - 1.00 mg/dL 1.96(H)  Sodium 135 - 145 mmol/L 124(L)  Potassium 3.5 - 5.1 mmol/L 4.8  Chloride 98 - 111 mmol/L 88(L)  CO2 22 - 32 mmol/L 25  Calcium 8.9 - 10.3 mg/dL 8.7(L)  Total Protein 6.5 - 8.1 g/dL 5.0(L)  Total Bilirubin 0.3 - 1.2 mg/dL 0.9  Alkaline Phos 38 - 126 U/L 135(H)  AST 15 - 41 U/L 31  ALT 0 - 44 U/L 16   CBC Latest Ref Rng & Units 10/04/2021  WBC 4.0 - 10.5 K/uL 24.5(H)  Hemoglobin 12.0 - 15.0 g/dL 9.3(L)  Hematocrit 36.0 - 46.0 % 27.3(L)  Platelets 150 -  400 K/uL 321    RADIOGRAPHIC STUDIES: I have personally reviewed the radiological images as listed and agreed with the findings in the report. CT ABDOMEN PELVIS WO CONTRAST  Result Date: 10/04/2021 CLINICAL DATA:  History of ovarian cancer with mid abdominal pain. EXAM: CT ABDOMEN AND PELVIS WITHOUT CONTRAST TECHNIQUE: Multidetector CT imaging of the abdomen and pelvis was performed following the standard protocol  without IV contrast. RADIATION DOSE REDUCTION: This exam was performed according to the departmental dose-optimization program which includes automated exposure control, adjustment of the mA and/or kV according to patient size and/or use of iterative reconstruction technique. COMPARISON:  August 31, 2021 FINDINGS: Lower chest: Mild atelectasis and/or early infiltrate is seen within the bilateral lung bases. Hepatobiliary: No focal liver abnormality is seen. Status post cholecystectomy. No biliary dilatation. Pancreas: Unremarkable. No pancreatic ductal dilatation or surrounding inflammatory changes. Spleen: Normal in size without focal abnormality. Adrenals/Urinary Tract: Adrenal glands are unremarkable. Kidneys are normal, without renal calculi, focal lesion, or hydronephrosis. Bladder is unremarkable. Stomach/Bowel: Stomach is within normal limits. Appendix appears normal. Numerous markedly dilated proximal small bowel loops are seen within the mid and upper left abdomen (the largest measures approximately 4.7 cm in caliber). A transition zone is seen within the anterior aspect of the mid abdomen (axial CT images 50 through 56, CT series 2). Vascular/Lymphatic: Aortic atherosclerosis. No enlarged abdominal or pelvic lymph nodes. Reproductive: A very large, approximately 15.6 cm x 11.9 cm x 15.3 cm partially calcified, complex solid and cystic mass lesion is again seen extending from the right adnexa into the mid to lower right abdomen. Other: No abdominal wall hernia or abnormality. A large  amount of abdominopelvic ascites is seen (approximately 7.29 Hounsfield units). Numerous tiny soft tissue nodules are seen scattered throughout the mesentery of the anterior mid to upper abdomen (axial CT images 38 through 46, CT series 2). Musculoskeletal: No acute or significant osseous findings. IMPRESSION: 1. Findings consistent with a high-grade, proximal partial small bowel obstruction bowel obstruction. 2. Large amount of abdominopelvic ascites with numerous tiny soft tissue nodules scattered throughout the mesentery of the anterior mid to upper abdomen, consistent with peritoneal carcinomatosis. 3. Large, complex solid and cystic mass lesion extending from the right adnexa into the mid to lower right abdomen, consistent with the patient's history of ovarian cancer. 4. Mild atelectasis and/or early infiltrate within the bilateral lung bases. 5. Aortic atherosclerosis. Aortic Atherosclerosis (ICD10-I70.0). Electronically Signed   By: Virgina Norfolk M.D.   On: 10/04/2021 02:49   DG Chest 2 View  Result Date: 09/21/2021 CLINICAL DATA:  Vomiting and weakness.  History of ovarian cancer. EXAM: CHEST - 2 VIEW COMPARISON:  Chest x-ray dated June 01, 2021. FINDINGS: Unchanged right chest wall port catheter. The heart size and mediastinal contours are within normal limits. Normal pulmonary vascularity. No focal consolidation, pleural effusion, or pneumothorax. No acute osseous abnormality. IMPRESSION: 1. No acute cardiopulmonary disease. Electronically Signed   By: Titus Dubin M.D.   On: 09/21/2021 11:10   DG Abd 1 View  Result Date: 09/22/2021 CLINICAL DATA:  Ovarian cancer with malignant ascites. Obstructive sleep apnea. Nausea and vomiting. EXAM: ABDOMEN - 1 VIEW COMPARISON:  09/07/2021 FINDINGS: Cholecystectomy clips noted. The bowel gas pattern is normal. No radio-opaque calculi or other significant radiographic abnormality are seen. IMPRESSION: Normal bowel gas pattern. Electronically Signed    By: Kerby Moors M.D.   On: 09/22/2021 18:12   DG Abd 1 View  Result Date: 09/07/2021 CLINICAL DATA:  Nausea, bloating EXAM: ABDOMEN - 1 VIEW COMPARISON:  09/02/2021 FINDINGS: The bowel gas pattern is normal. Cholecystectomy clips. Pelvic phleboliths. Minimal spurring in the lumbar spine. IMPRESSION: Normal bowel gas pattern.  No acute findings. Electronically Signed   By: Lucrezia Europe M.D.   On: 09/07/2021 10:39   CT CHEST WO CONTRAST  Result Date: 10/04/2021 CLINICAL DATA:  Chest pain and shortness of breath.  EXAM: CT CHEST WITHOUT CONTRAST TECHNIQUE: Multidetector CT imaging of the chest was performed following the standard protocol without IV contrast. RADIATION DOSE REDUCTION: This exam was performed according to the departmental dose-optimization program which includes automated exposure control, adjustment of the mA and/or kV according to patient size and/or use of iterative reconstruction technique. COMPARISON:  June 25, 2021 FINDINGS: Cardiovascular: There is stable right-sided venous Port-A-Cath positioning. No significant vascular findings. Normal heart size. No pericardial effusion. Mediastinum/Nodes: No enlarged mediastinal or axillary lymph nodes. Heterogeneous material is seen throughout the lumen of a mildly distended esophagus. The thyroid gland and trachea demonstrate no significant findings. Lungs/Pleura: Mild to moderate severity atelectasis and/or infiltrate is seen within the inferior aspect of the right upper lobe and posterior aspects of the bilateral lower lobes. This is new when compared to the prior study. There is no evidence of a pleural effusion or pneumothorax. Upper Abdomen: Moderate severity free fluid is seen within the abdomen. This is markedly decreased in severity within the perihepatic region and increased in severity within the anterior abdomen and anterolateral left upper quadrant. Surgical clips are noted within the gallbladder fossa. Markedly dilated small bowel  loops are suspected within the left upper quadrant. Musculoskeletal: No chest wall mass or suspicious bone lesions identified. IMPRESSION: 1. Mild to moderate severity areas of right upper lobe and bilateral lower lobe atelectasis and/or infiltrate. 2. Moderate severity free fluid within the abdomen. This is markedly decreased in severity within the perihepatic region and increased in severity within the anterior abdomen and anterolateral left upper quadrant. 3. Findings suspicious for the presence of a small bowel obstruction. Correlation with abdomen pelvis CT is recommended. Electronically Signed   By: Virgina Norfolk M.D.   On: 10/04/2021 01:03   US RENAL  Result Date: 09/21/2021 CLINICAL DATA:  Acute renal failure. EXAM: RENAL / URINARY TRACT ULTRASOUND COMPLETE COMPARISON:  None. FINDINGS: Right Kidney: Renal measurements: 9.7 cm x 4.9 cm x 4.5 cm = volume: 109.5 mL. Echogenicity within normal limits. No mass or hydronephrosis visualized. Left Kidney: Renal measurements: 9.9 cm x 4.5 cm x 4.3 cm = volume: 101.03 mL. Echogenicity within normal limits. No mass or hydronephrosis visualized. Bladder: Appears normal for degree of bladder distention. Other: None. IMPRESSION: Normal renal ultrasound. Electronically Signed   By: Virgina Norfolk M.D.   On: 09/21/2021 22:33   US Paracentesis  Result Date: 09/28/2021 INDICATION: Recurrent ascites secondary to ovarian cancer. Request for therapeutic paracentesis up to 4 L. EXAM: ULTRASOUND GUIDED PARACENTESIS MEDICATIONS: 1% lidocaine 10 mL COMPLICATIONS: None immediate. PROCEDURE: Informed written consent was obtained from the patient after a discussion of the risks, benefits and alternatives to treatment. A timeout was performed prior to the initiation of the procedure. Initial ultrasound scanning demonstrates a large amount of ascites within the left lateral abdomen. The left lateral abdomen was prepped and draped in the usual sterile fashion. 1% lidocaine  was used for local anesthesia. Following this, a 19 gauge, 7-cm, Yueh catheter was introduced. An ultrasound image was saved for documentation purposes. The paracentesis was performed. The catheter was removed and a dressing was applied. The patient tolerated the procedure well without immediate post procedural complication. FINDINGS: A total of approximately 4 L of clear yellow fluid was removed. IMPRESSION: Successful ultrasound-guided paracentesis yielding 4 liters of peritoneal fluid. Read by: Gareth Eagle, PA-C Electronically Signed   By: Albin Felling M.D.   On: 09/28/2021 15:12   US Paracentesis  Result Date: 09/21/2021 INDICATION: Patient with a  history of ovarian cancer and recurrent large volume ascites presents today for therapeutic diagnostic paracentesis. EXAM: ULTRASOUND GUIDED PARACENTESIS MEDICATIONS: 1% lidocaine 10 mL COMPLICATIONS: None immediate. PROCEDURE: Informed written consent was obtained from the patient after a discussion of the risks, benefits and alternatives to treatment. A timeout was performed prior to the initiation of the procedure. Initial ultrasound scanning demonstrates a large amount of ascites within the right lower abdominal quadrant. The right lower abdomen was prepped and draped in the usual sterile fashion. 1% lidocaine was used for local anesthesia. Following this, a 19 gauge, 7-cm, Yueh catheter was introduced. An ultrasound image was saved for documentation purposes. The paracentesis was performed. The catheter was removed and a dressing was applied. The patient tolerated the procedure well without immediate post procedural complication. FINDINGS: A total of approximately 5 L of amber-colored fluid was removed. Samples were sent to the laboratory as requested by the clinical team. IMPRESSION: Successful ultrasound-guided paracentesis yielding 5 liters of peritoneal fluid. Read by: Soyla Dryer, NP Electronically Signed   By: Aletta Edouard M.D.   On: 09/21/2021  17:15   US Paracentesis  Result Date: 09/13/2021 INDICATION: ascites EXAM: ULTRASOUND GUIDED  PARACENTESIS MEDICATIONS: None. COMPLICATIONS: None immediate. PROCEDURE: Informed written consent was obtained from the patient after a discussion of the risks, benefits and alternatives to treatment. A timeout was performed prior to the initiation of the procedure. Initial ultrasound scanning demonstrates a large amount of ascites within the right lower abdominal quadrant. The right lower abdomen was prepped and draped in the usual sterile fashion. 1% lidocaine was used for local anesthesia. Following this, a 19 gauge, 10-cm, Yueh catheter was introduced. An ultrasound image was saved for documentation purposes. The paracentesis was performed. The catheter was removed and a dressing was applied. The patient tolerated the procedure well without immediate post procedural complication. FINDINGS: A total of approximately 4.0 of dark amber fluid was removed. IMPRESSION: Successful ultrasound-guided paracentesis yielding approximately 4.0 liters of dark amber peritoneal fluid. Electronically Signed   By: Albin Felling M.D.   On: 09/13/2021 08:31   US Paracentesis  Result Date: 09/07/2021 INDICATION: History of ovarian cancer. Please perform ultrasound-guided paracentesis for therapeutic purposes. EXAM: ULTRASOUND-GUIDED PARACENTESIS COMPARISON:  Multiple previous ultrasound-guided paracenteses, most recently on 08/31/2021 yielding sphenoid cc of ascitic fluid MEDICATIONS: None. COMPLICATIONS: None immediate. TECHNIQUE: Informed written consent was obtained from the patient after a discussion of the risks, benefits and alternatives to treatment. A timeout was performed prior to the initiation of the procedure. Initial ultrasound scanning demonstrates a large amount of ascites within the right lower abdomen which was subsequently prepped and draped in the usual sterile fashion. 1% lidocaine with epinephrine was used  for local anesthesia. Under direct ultrasound guidance, a 19 gauge, 7-cm, Yueh catheter was introduced. An ultrasound image was saved for documentation purposed. The paracentesis was performed. The catheter was removed and a dressing was applied. The patient tolerated the procedure well without immediate post procedural complication. FINDINGS: A total of approximately 4.6 liters of blood-tinged serous fluid was removed. Samples were sent to the laboratory as requested by the clinical team. IMPRESSION: Successful ultrasound-guided paracentesis yielding 4.6 liters of peritoneal fluid. Electronically Signed   By: Sandi Mariscal M.D.   On: 09/07/2021 16:29    Assessment and plan-   #Ovarian cancer with malignant ascites, peritoneal carcinomatosis, now with high-grade small bowel partial obstruction. N.p.o. Prognosis is poor given the aggressive nature of her disease, malnutrition, declining functional status.  The chance of  her getting better enough to receive additional treatment is very slim.  I recommend hospice. Patient has discussed with palliative care service and she agrees with the plan. For recurrent ascites, I recommend peritoneal catheter placement for symptom palliation. 2D echo has been ordered for work-up of possible right heart failure. Intractable nausea vomiting due to small bowel obstruction.  recommend IV dexamethasone 12 mg daily, and IV Reglan 5mg  Q6h scheduled. Ordered   AKI, IV fluid.   Thank you for allowing me to participate in the care of this patient.   Earlie Server, MD, PhD 10/04/2021

## 2021-10-04 NOTE — Consult Note (Signed)
[place] at [facility] Telephone:([phone]) [phone] [phone]   Name: Brittany Montoya Date: 10/04/2021 MRN: [id number]  DOB: 1960/10/25  Patient Care Team: Adin Hector, MD as PCP - General (Internal Medicine) Earlie Server, MD as Consulting Physician (Hematology and Oncology) Clent Jacks, RN as Oncology Nurse Navigator    REASON FOR CONSULTATION: Brittany Montoya is a 61 y.o. 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  Patient is status post frequent large-volume paracentesis.  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  Patient has had recent history of significant intractable nausea and vomiting.  She was hospitalized 09/21/2021-09/25/2021 with acute renal failure superimposed on stage IIIa CKD.  Patient was readmitted 10/03/2021 with nausea and vomiting.  CT was concerning for high-grade SBO and findings consistent with peritoneal carcinomatosis likely reflecting disease progression.  Palliative care was consulted to address goals.  SOCIAL HISTORY:     reports that she has never smoked. She has never used smokeless tobacco. She reports that she does not drink alcohol and does not use drugs.  Patient is married and lives at home with her husband.  She has a daughter who lives nearby and another daughter in [place].  ADVANCE DIRECTIVES:  Not on file  CODE STATUS: DNR  PAST MEDICAL HISTORY: Past Medical History:  Diagnosis Date   Anxiety    Arthritis    Ascites    Cancer associated pain    Cirrhosis of liver ([place])    DDD (degenerative disc disease), cervical    Depression    Family history of breast cancer    GERD (gastroesophageal reflux disease)    Glaucoma    Headache    migraines   Hypercoagulable state ([place])    PE 03/2021   Hypertension     Ovarian cancer ([place]) 04/15/2021   PONV (postoperative nausea and vomiting)    Pulmonary embolism ([place]) 04/12/2021   04/12/21 CTA: multiple bilateral lobar, segmental, and subsegmental PEs. Treated with Eliquis.   Sleep apnea    Venous stasis     PAST SURGICAL HISTORY:  Past Surgical History:  Procedure Laterality Date   ABDOMINAL HYSTERECTOMY N/A 2008   may have had left ovary removed   [place]     2012, 2015, 2020   COLONOSCOPY N/A 06/29/2021   Procedure: COLONOSCOPY;  Surgeon: Toledo, Benay Pike, MD;  Location: [facility] ENDOSCOPY;  Service: Gastroenterology;  Laterality: N/A;  DOCTORS ARE IN AGREEMENT THAT TOLEDO CAN DO THIS PROCEDURE IN RUSSO'S BLOCK   ESOPHAGOGASTRODUODENOSCOPY N/A 06/29/2021   Procedure: ESOPHAGOGASTRODUODENOSCOPY (EGD);  Surgeon: Toledo, Benay Pike, MD;  Location: [facility] ENDOSCOPY;  Service: Gastroenterology;  Laterality: N/A;   PARACENTESIS  04/20/2021   PILONIDAL CYST / SINUS EXCISION     PORTACATH PLACEMENT Right 06/01/2021   Procedure: INSERTION PORT-A-CATH;  Surgeon: Herbert Pun, MD;  Location: [facility] ORS;  Service: General;  Laterality: Right;    HEMATOLOGY/ONCOLOGY HISTORY:  Oncology History  Ovarian cancer ([place])  04/15/2021 Initial Diagnosis   Ovarian cancer ([place])   04/18/2021 Cancer Staging   Staging form: Ovary, Fallopian Tube, and Primary Peritoneal Carcinoma, AJCC 8th Edition - Clinical stage from 04/18/2021: FIGO Stage II, calculated as Stage Unknown (cT2, cNX, cM0) - Signed by Earlie Server, MD on 04/19/2021 Stage prefix: Initial diagnosis  04/19/2021 -  Chemotherapy   Patient is on Treatment Plan : OVARIAN Carboplatin (AUC 6) / Paclitaxel (175) q21d x 6 cycles      Genetic Testing   Negative genetic testing. No pathogenic variants identified on the [facility] panel. The report date is 06/25/2021.  The [facility] gene panel offered by [employer] includes sequencing and  deletion/duplication testing of the following 47 genes:  APC, ATM, AXIN2, BAP1, BARD1, BMPR1A, BRIP1, BRCA1, BRCA2, CDH1, CDK4, CDKN2A, CHEK2, CTNNA1, FH, FLCN, HOXB13 (seq only), MEN1, MET, MLH1, MSH2, MSH3 (excluding repetitive portions of exon 1), MSH6, MUTYH, NTHL1, PALB2, PMS2, PTEN, RAD51C, RAD51D, SDHA, SDHB, SDHC, SDHD, SMAD4, STK11, TP53, TSC1, TSC2, VHL.  [employer] CDx HRD was also negative.      ALLERGIES:  is allergic to paclitaxel, amlodipine, and penicillins.  MEDICATIONS:  Current Facility-Administered Medications  Medication Dose Route Frequency Provider Last Rate Last Admin   0.9 %  sodium chloride infusion   Intravenous Once Richarda Osmond, MD 150 mL/hr at 10/04/21 0800 New Bag at 10/04/21 0800   albuterol (PROVENTIL) (2.5 MG/3ML) 0.083% nebulizer solution 3 mL  3 mL Inhalation Q6H PRN Richarda Osmond, MD       [START ON 10/05/2021] Chlorhexidine Gluconate Cloth 2 % PADS 6 each  6 each Topical Q0600 Richarda Osmond, MD       clonazePAM (KLONOPIN) tablet 0.5 mg  0.5 mg Oral Daily PRN Richarda Osmond, MD       enoxaparin (LOVENOX) injection 80 mg  1 mg/kg Subcutaneous Daily Doristine Mango L, MD       HYDROmorphone (DILAUDID) injection 0.5 mg  0.5 mg Intravenous Q6H PRN Richarda Osmond, MD   0.5 mg at 10/04/21 1035   metoCLOPramide (REGLAN) tablet 10 mg  10 mg Oral Q8H PRN Richarda Osmond, MD       ondansetron [facility]) injection 4 mg  4 mg Intravenous Q6H PRN Sharion Settler, NP   4 mg at 10/03/21 2149   oxyCODONE (Oxy IR/ROXICODONE) immediate release tablet 5 mg  5 mg Oral Q6H PRN Richarda Osmond, MD       pantoprazole (PROTONIX) injection 40 mg  40 mg Intravenous Q12H Florina Ou V, MD   40 mg at 10/04/21 0801   prochlorperazine (COMPAZINE) tablet 10 mg  10 mg Oral Q6H PRN Richarda Osmond, MD       sodium chloride flush (NS) 0.9 % injection 3 mL  3 mL Intravenous Q12H Para Skeans, MD   3 mL at 10/04/21 9629   Facility-Administered  Medications Ordered in Other Encounters  Medication Dose Route Frequency Provider Last Rate Last Admin   heparin lock flush 100 unit/mL  500 Units Intravenous Once Earlie Server, MD       LORazepam (ATIVAN) injection 0.5 mg  0.5 mg Intravenous Once PRN Earlie Server, MD       sodium chloride flush (NS) 0.9 % injection 10 mL  10 mL Intravenous PRN Earlie Server, MD   10 mL at 09/21/21 0849    VITAL SIGNS: BP (!) 77/56    Pulse 89    Temp 98.1 F (36.7 C)    Resp 18    Ht _0  (1.549 m)    Wt 175 lb 11.3 oz (79.7 kg)    SpO2 93%    BMI 33.20 kg/m  Filed Weights   10/03/21 2317  Weight: 175 lb 11.3 oz (79.7 kg)    Estimated body mass index is 33.2  kg/m as calculated from the following:   Height as of this encounter: _0  (1.549 m).   Weight as of this encounter: 175 lb 11.3 oz (79.7 kg).  LABS: CBC:    Component Value Date/Time   WBC 24.5 (H) 10/04/2021 0300   HGB 9.3 (L) 10/04/2021 0300   HCT 27.3 (L) 10/04/2021 0300   PLT 321 10/04/2021 0300   MCV 93.2 10/04/2021 0300   NEUTROABS 23.0 (H) 10/04/2021 0300   LYMPHSABS 0.6 (L) 10/04/2021 0300   MONOABS 0.8 10/04/2021 0300   EOSABS 0.0 10/04/2021 0300   BASOSABS 0.0 10/04/2021 0300   Comprehensive Metabolic Panel:    Component Value Date/Time   NA 124 (L) 10/04/2021 0300   K 4.8 10/04/2021 0300   CL 88 (L) 10/04/2021 0300   CO2 25 10/04/2021 0300   BUN 41 (H) 10/04/2021 0300   CREATININE 1.96 (H) 10/04/2021 0300   GLUCOSE 102 (H) 10/04/2021 0300   CALCIUM 8.7 (L) 10/04/2021 0300   AST 31 10/04/2021 0300   ALT 16 10/04/2021 0300   ALKPHOS 135 (H) 10/04/2021 0300   BILITOT 0.9 10/04/2021 0300   PROT 5.0 (L) 10/04/2021 0300   ALBUMIN <1.5 (L) 10/04/2021 0300    RADIOGRAPHIC STUDIES: CT ABDOMEN PELVIS WO CONTRAST  Result Date: 10/04/2021 CLINICAL DATA:  History of ovarian cancer with mid abdominal pain. EXAM: CT ABDOMEN AND PELVIS WITHOUT CONTRAST TECHNIQUE: Multidetector CT imaging of the abdomen and pelvis was performed following  the standard protocol without IV contrast. RADIATION DOSE REDUCTION: This exam was performed according to the departmental dose-optimization program which includes automated exposure control, adjustment of the mA and/or kV according to patient size and/or use of iterative reconstruction technique. COMPARISON:  August 31, 2021 FINDINGS: Lower chest: Mild atelectasis and/or early infiltrate is seen within the bilateral lung bases. Hepatobiliary: No focal liver abnormality is seen. Status post cholecystectomy. No biliary dilatation. Pancreas: Unremarkable. No pancreatic ductal dilatation or surrounding inflammatory changes. Spleen: Normal in size without focal abnormality. Adrenals/Urinary Tract: Adrenal glands are unremarkable. Kidneys are normal, without renal calculi, focal lesion, or hydronephrosis. Bladder is unremarkable. Stomach/Bowel: Stomach is within normal limits. Appendix appears normal. Numerous markedly dilated proximal small bowel loops are seen within the mid and upper left abdomen (the largest measures approximately 4.7 cm in caliber). A transition zone is seen within the anterior aspect of the mid abdomen (axial CT images 50 through 56, CT series 2). Vascular/Lymphatic: Aortic atherosclerosis. No enlarged abdominal or pelvic lymph nodes. Reproductive: A very large, approximately 15.6 cm x 11.9 cm x 15.3 cm partially calcified, complex solid and cystic mass lesion is again seen extending from the right adnexa into the mid to lower right abdomen. Other: No abdominal wall hernia or abnormality. A large amount of abdominopelvic ascites is seen (approximately 7.29 Hounsfield units). Numerous tiny soft tissue nodules are seen scattered throughout the mesentery of the anterior mid to upper abdomen (axial CT images 38 through 46, CT series 2). Musculoskeletal: No acute or significant osseous findings. IMPRESSION: 1. Findings consistent with a high-grade, proximal partial small bowel obstruction bowel  obstruction. 2. Large amount of abdominopelvic ascites with numerous tiny soft tissue nodules scattered throughout the mesentery of the anterior mid to upper abdomen, consistent with peritoneal carcinomatosis. 3. Large, complex solid and cystic mass lesion extending from the right adnexa into the mid to lower right abdomen, consistent with the patient's history of ovarian cancer. 4. Mild atelectasis and/or early infiltrate within the bilateral lung bases. 5. Aortic atherosclerosis.  Aortic Atherosclerosis (ICD10-I70.0). Electronically Signed   By: Virgina Norfolk M.D.   On: 10/04/2021 02:49   DG Chest 2 View  Result Date: 09/21/2021 CLINICAL DATA:  Vomiting and weakness.  History of ovarian cancer. EXAM: CHEST - 2 VIEW COMPARISON:  Chest x-ray dated June 01, 2021. FINDINGS: Unchanged right chest wall port catheter. The heart size and mediastinal contours are within normal limits. Normal pulmonary vascularity. No focal consolidation, pleural effusion, or pneumothorax. No acute osseous abnormality. IMPRESSION: 1. No acute cardiopulmonary disease. Electronically Signed   By: Titus Dubin M.D.   On: 09/21/2021 11:10   DG Abd 1 View  Result Date: 09/22/2021 CLINICAL DATA:  Ovarian cancer with malignant ascites. Obstructive sleep apnea. Nausea and vomiting. EXAM: ABDOMEN - 1 VIEW COMPARISON:  09/07/2021 FINDINGS: Cholecystectomy clips noted. The bowel gas pattern is normal. No radio-opaque calculi or other significant radiographic abnormality are seen. IMPRESSION: Normal bowel gas pattern. Electronically Signed   By: Kerby Moors M.D.   On: 09/22/2021 18:12   DG Abd 1 View  Result Date: 09/07/2021 CLINICAL DATA:  Nausea, bloating EXAM: ABDOMEN - 1 VIEW COMPARISON:  09/02/2021 FINDINGS: The bowel gas pattern is normal. Cholecystectomy clips. Pelvic phleboliths. Minimal spurring in the lumbar spine. IMPRESSION: Normal bowel gas pattern.  No acute findings. Electronically Signed   By: Lucrezia Europe M.D.    On: 09/07/2021 10:39   CT CHEST WO CONTRAST  Result Date: 10/04/2021 CLINICAL DATA:  Chest pain and shortness of breath. EXAM: CT CHEST WITHOUT CONTRAST TECHNIQUE: Multidetector CT imaging of the chest was performed following the standard protocol without IV contrast. RADIATION DOSE REDUCTION: This exam was performed according to the departmental dose-optimization program which includes automated exposure control, adjustment of the mA and/or kV according to patient size and/or use of iterative reconstruction technique. COMPARISON:  June 25, 2021 FINDINGS: Cardiovascular: There is stable right-sided venous Port-A-Cath positioning. No significant vascular findings. Normal heart size. No pericardial effusion. Mediastinum/Nodes: No enlarged mediastinal or axillary lymph nodes. Heterogeneous material is seen throughout the lumen of a mildly distended esophagus. The thyroid gland and trachea demonstrate no significant findings. Lungs/Pleura: Mild to moderate severity atelectasis and/or infiltrate is seen within the inferior aspect of the right upper lobe and posterior aspects of the bilateral lower lobes. This is new when compared to the prior study. There is no evidence of a pleural effusion or pneumothorax. Upper Abdomen: Moderate severity free fluid is seen within the abdomen. This is markedly decreased in severity within the perihepatic region and increased in severity within the anterior abdomen and anterolateral left upper quadrant. Surgical clips are noted within the gallbladder fossa. Markedly dilated small bowel loops are suspected within the left upper quadrant. Musculoskeletal: No chest wall mass or suspicious bone lesions identified. IMPRESSION: 1. Mild to moderate severity areas of right upper lobe and bilateral lower lobe atelectasis and/or infiltrate. 2. Moderate severity free fluid within the abdomen. This is markedly decreased in severity within the perihepatic region and increased in severity  within the anterior abdomen and anterolateral left upper quadrant. 3. Findings suspicious for the presence of a small bowel obstruction. Correlation with abdomen pelvis CT is recommended. Electronically Signed   By: Virgina Norfolk M.D.   On: 10/04/2021 01:03   US RENAL  Result Date: 09/21/2021 CLINICAL DATA:  Acute renal failure. EXAM: RENAL / URINARY TRACT ULTRASOUND COMPLETE COMPARISON:  None. FINDINGS: Right Kidney: Renal measurements: 9.7 cm x 4.9 cm x 4.5 cm = volume: 109.5 mL. Echogenicity within normal  limits. No mass or hydronephrosis visualized. Left Kidney: Renal measurements: 9.9 cm x 4.5 cm x 4.3 cm = volume: 101.03 mL. Echogenicity within normal limits. No mass or hydronephrosis visualized. Bladder: Appears normal for degree of bladder distention. Other: None. IMPRESSION: Normal renal ultrasound. Electronically Signed   By: Virgina Norfolk M.D.   On: 09/21/2021 22:33   US Paracentesis  Result Date: 09/28/2021 INDICATION: Recurrent ascites secondary to ovarian cancer. Request for therapeutic paracentesis up to 4 L. EXAM: ULTRASOUND GUIDED PARACENTESIS MEDICATIONS: 1% lidocaine 10 mL COMPLICATIONS: None immediate. PROCEDURE: Informed written consent was obtained from the patient after a discussion of the risks, benefits and alternatives to treatment. A timeout was performed prior to the initiation of the procedure. Initial ultrasound scanning demonstrates a large amount of ascites within the left lateral abdomen. The left lateral abdomen was prepped and draped in the usual sterile fashion. 1% lidocaine was used for local anesthesia. Following this, a 19 gauge, 7-cm, Yueh catheter was introduced. An ultrasound image was saved for documentation purposes. The paracentesis was performed. The catheter was removed and a dressing was applied. The patient tolerated the procedure well without immediate post procedural complication. FINDINGS: A total of approximately 4 L of clear yellow fluid was  removed. IMPRESSION: Successful ultrasound-guided paracentesis yielding 4 liters of peritoneal fluid. Read by: Gareth Eagle, PA-C Electronically Signed   By: Albin Felling M.D.   On: 09/28/2021 15:12   US Paracentesis  Result Date: 09/21/2021 INDICATION: Patient with a history of ovarian cancer and recurrent large volume ascites presents today for therapeutic diagnostic paracentesis. EXAM: ULTRASOUND GUIDED PARACENTESIS MEDICATIONS: 1% lidocaine 10 mL COMPLICATIONS: None immediate. PROCEDURE: Informed written consent was obtained from the patient after a discussion of the risks, benefits and alternatives to treatment. A timeout was performed prior to the initiation of the procedure. Initial ultrasound scanning demonstrates a large amount of ascites within the right lower abdominal quadrant. The right lower abdomen was prepped and draped in the usual sterile fashion. 1% lidocaine was used for local anesthesia. Following this, a 19 gauge, 7-cm, Yueh catheter was introduced. An ultrasound image was saved for documentation purposes. The paracentesis was performed. The catheter was removed and a dressing was applied. The patient tolerated the procedure well without immediate post procedural complication. FINDINGS: A total of approximately 5 L of amber-colored fluid was removed. Samples were sent to the laboratory as requested by the clinical team. IMPRESSION: Successful ultrasound-guided paracentesis yielding 5 liters of peritoneal fluid. Read by: Soyla Dryer, NP Electronically Signed   By: Aletta Edouard M.D.   On: 09/21/2021 17:15   US Paracentesis  Result Date: 09/13/2021 INDICATION: ascites EXAM: ULTRASOUND GUIDED  PARACENTESIS MEDICATIONS: None. COMPLICATIONS: None immediate. PROCEDURE: Informed written consent was obtained from the patient after a discussion of the risks, benefits and alternatives to treatment. A timeout was performed prior to the initiation of the procedure. Initial ultrasound  scanning demonstrates a large amount of ascites within the right lower abdominal quadrant. The right lower abdomen was prepped and draped in the usual sterile fashion. 1% lidocaine was used for local anesthesia. Following this, a 19 gauge, 10-cm, Yueh catheter was introduced. An ultrasound image was saved for documentation purposes. The paracentesis was performed. The catheter was removed and a dressing was applied. The patient tolerated the procedure well without immediate post procedural complication. FINDINGS: A total of approximately 4.0 of dark amber fluid was removed. IMPRESSION: Successful ultrasound-guided paracentesis yielding approximately 4.0 liters of dark amber peritoneal fluid. Electronically Signed  By: Albin Felling M.D.   On: 09/13/2021 08:31   US Paracentesis  Result Date: 09/07/2021 INDICATION: History of ovarian cancer. Please perform ultrasound-guided paracentesis for therapeutic purposes. EXAM: ULTRASOUND-GUIDED PARACENTESIS COMPARISON:  Multiple previous ultrasound-guided paracenteses, most recently on 08/31/2021 yielding sphenoid cc of ascitic fluid MEDICATIONS: None. COMPLICATIONS: None immediate. TECHNIQUE: Informed written consent was obtained from the patient after a discussion of the risks, benefits and alternatives to treatment. A timeout was performed prior to the initiation of the procedure. Initial ultrasound scanning demonstrates a large amount of ascites within the right lower abdomen which was subsequently prepped and draped in the usual sterile fashion. 1% lidocaine with epinephrine was used for local anesthesia. Under direct ultrasound guidance, a 19 gauge, 7-cm, Yueh catheter was introduced. An ultrasound image was saved for documentation purposed. The paracentesis was performed. The catheter was removed and a dressing was applied. The patient tolerated the procedure well without immediate post procedural complication. FINDINGS: A total of approximately 4.6 liters of  blood-tinged serous fluid was removed. Samples were sent to the laboratory as requested by the clinical team. IMPRESSION: Successful ultrasound-guided paracentesis yielding 4.6 liters of peritoneal fluid. Electronically Signed   By: Sandi Mariscal M.D.   On: 09/07/2021 16:29    PERFORMANCE STATUS (ECOG) : 1 - Symptomatic but completely ambulatory  Review of Systems Unless otherwise noted, a complete review of systems is negative.  Physical Exam General: NAD Pulmonary: Unlabored Extremities: no edema, no joint deformities Skin: no rashes Neurological: Weakness but otherwise nonfocal  IMPRESSION: Patient is known to me from her recent hospitalization.  Patient was tearful as she described her conversation with the surgeon this morning.  Patient verbalized an understanding that imaging is suggestive of disease progression and at this point she is not felt to have viable surgical options for management of her malignant SBO.  We discussed hospice at length.  Both patient and daughter were in agreement with hospice involvement at home and focusing on comfort care until end-of-life.  We also discussed the future option of hospice IPU in the event that her symptomatic burden becomes too great to manage at home.  Daughter asked about placing an NG tube.  However, at this point patient is tolerating ice chips and does not have active nausea.  I would recommend holding off on NGT placement unless symptoms cannot be otherwise managed.  Generally, venting PEG could be considered in lieu of NGT but patient would likely be a poor candidate for this given high probability of severe leakage due to her high-volume/recurrent ascites.  Patient is on metoclopramide and I would consider adding dexamethasone for medical management of malignant SBO.    I would recommend consideration for placement of a tunneled peritoneal catheter to allow for home management of her ascites.  Patient has had rapidly reaccumulating,  large volume ascites requiring paracentesis almost weekly.  This was discussed in detail with patient and family who verbalized interest in pursuing this option prior to discharge home.  We discussed CODE STATUS.  Patient stated that she would not want to be resuscitated or have her life prolonged artificially on machines.  Her daughter was in agreement with this decision.   PLAN: -Best supportive care -Referral for hospice at home -Consider adding dexamethasone 4 mg daily in addition to metoclopramide for medical management of malignant SBO -Please refer patient to IR for consideration of a tunneled peritoneal catheter for home management of ascites -DNR/DNI   Time Total: 60 minutes  Visit consisted of  counseling and education dealing with the complex and emotionally intense issues of symptom management and palliative care in the setting of serious and potentially life-threatening illness.Greater than 50%  of this time was spent counseling and coordinating care related to the above assessment and plan.  Signed by: Altha Harm, PhD, NP-C

## 2021-10-05 ENCOUNTER — Inpatient Hospital Stay: Payer: BC Managed Care – PPO | Admitting: Radiology

## 2021-10-05 ENCOUNTER — Ambulatory Visit
Admission: RE | Admit: 2021-10-05 | Discharge: 2021-10-05 | Disposition: A | Discharge status: Home / Self Care | Payer: BC Managed Care – PPO | Source: Ambulatory Visit | Attending: Oncology | Admitting: Oncology

## 2021-10-05 ENCOUNTER — Inpatient Hospital Stay: Payer: BC Managed Care – PPO

## 2021-10-05 ENCOUNTER — Inpatient Hospital Stay: Payer: BC Managed Care – PPO | Admitting: Oncology

## 2021-10-05 DIAGNOSIS — D508 Other iron deficiency anemias: Secondary | ICD-10-CM

## 2021-10-05 DIAGNOSIS — R180 Malignant ascites: Secondary | ICD-10-CM

## 2021-10-05 DIAGNOSIS — H4010X0 Unspecified open-angle glaucoma, stage unspecified: Secondary | ICD-10-CM

## 2021-10-05 HISTORY — PX: IR PERC TUN PERIT CATH W/PORT S&I /IMAG: IMG2328

## 2021-10-05 LAB — PROCALCITONIN: Procalcitonin: 2 ng/mL

## 2021-10-05 MED ORDER — FENTANYL CITRATE (PF) 100 MCG/2ML IJ SOLN
INTRAMUSCULAR | Status: AC | PRN
Start: 2021-10-05 — End: 2021-10-05
  Administered 2021-10-05: 50 ug via INTRAVENOUS

## 2021-10-05 MED ORDER — LIDOCAINE HCL 1 % IJ SOLN
INTRAMUSCULAR | Status: AC
  Administered 2021-10-05: 15:00:00 15 mL
  Filled 2021-10-05: qty 20

## 2021-10-05 MED ORDER — DEXAMETHASONE SODIUM PHOSPHATE 10 MG/ML IJ SOLN
12.0000 mg | Freq: Every day | INTRAMUSCULAR | Status: DC
  Administered 2021-10-06: 10:00:00 12 mg via INTRAVENOUS
  Filled 2021-10-05: qty 2

## 2021-10-05 MED ORDER — HYDROMORPHONE HCL 1 MG/ML IJ SOLN
0.5000 mg | Freq: Four times a day (QID) | INTRAMUSCULAR | Status: DC | PRN
  Administered 2021-10-05 – 2021-10-06 (×4): 0.5 mg via INTRAVENOUS
  Filled 2021-10-05 (×4): qty 1

## 2021-10-05 MED ORDER — FENTANYL CITRATE (PF) 100 MCG/2ML IJ SOLN
INTRAMUSCULAR | Status: AC
  Filled 2021-10-05: qty 2

## 2021-10-05 MED ORDER — LORAZEPAM 2 MG/ML IJ SOLN: 0.5000 mg | Freq: Four times a day (QID) | INTRAMUSCULAR | Status: DC | PRN

## 2021-10-05 MED ORDER — VANCOMYCIN HCL IN DEXTROSE 1-5 GM/200ML-% IV SOLN
1000.0000 mg | Freq: Once | INTRAVENOUS | Status: AC
  Administered 2021-10-05: 14:00:00 1000 mg via INTRAVENOUS
  Filled 2021-10-05: qty 200

## 2021-10-05 MED ORDER — MIDAZOLAM HCL 2 MG/2ML IJ SOLN
INTRAMUSCULAR | Status: AC
  Filled 2021-10-05: qty 2

## 2021-10-05 MED ORDER — MIDAZOLAM HCL 2 MG/2ML IJ SOLN
INTRAMUSCULAR | Status: AC | PRN
  Administered 2021-10-05: 1 mg via INTRAVENOUS

## 2021-10-05 NOTE — Consult Note (Signed)
Chief Complaint: Malignant recurrent ascites. Request is for abdominal PleurX catheter placement  Referring Physician(s): Dr. Prince Rome  Supervising Physician: Juliet Rude  Patient Status: Brittany Montoya  History of Present Illness: Brittany Montoya is a 61 y.o. female inpatient. History of GERD, Cirrhosis, HTN, PE, (on eliquis), ovarian cancer with recurrent ascites and peritoneal carcinomatosis. Presented tot the ED at [facility] with abdominal pain and intractable nausea and vomiting. Found to have a bowel obstruction, ascites and AKI. Due to the aggressive nature of her disease the Patient has elected for in home hospice. Team is requesting a abdominal pleurX catheter for ongoing recurrent ascites.   Daughter at bedside.  Patient alert and laying in bed, calm. Endorses abdominal pain that is improved since admission and generalized pain that is worse with movement.Denies any fevers, headache, chest pain, SOB, cough, abdominal pain, nausea, vomiting or bleeding. Return precautions and treatment recommendations and follow-up discussed with the patient and her daughter  who are agreeable with the plan.  Past Medical History:  Diagnosis Date   Anxiety    Arthritis    Ascites    Cancer associated pain    Cirrhosis of liver (HCC)    DDD (degenerative disc disease), cervical    Depression    Family history of breast cancer    GERD (gastroesophageal reflux disease)    Glaucoma    Headache    migraines   Hypercoagulable state ([place])    PE 03/2021   Hypertension    Ovarian cancer ([place]) 04/15/2021   PONV (postoperative nausea and vomiting)    Pulmonary embolism ([place]) 04/12/2021   04/12/21 CTA: multiple bilateral lobar, segmental, and subsegmental PEs. Treated with Eliquis.   Sleep apnea    Venous stasis     Past Surgical History:  Procedure Laterality Date   ABDOMINAL HYSTERECTOMY N/A 2008   may have had left ovary removed   [place]     2012, 2015, 2020   COLONOSCOPY N/A 06/29/2021   Procedure: COLONOSCOPY;  Surgeon: Toledo, Benay Pike, MD;  Location: [facility] ENDOSCOPY;  Service: Gastroenterology;  Laterality: N/A;  DOCTORS ARE IN AGREEMENT THAT TOLEDO CAN DO THIS PROCEDURE IN RUSSO'S BLOCK   ESOPHAGOGASTRODUODENOSCOPY N/A 06/29/2021   Procedure: ESOPHAGOGASTRODUODENOSCOPY (EGD);  Surgeon: Toledo, Benay Pike, MD;  Location: [facility] ENDOSCOPY;  Service: Gastroenterology;  Laterality: N/A;   PARACENTESIS  04/20/2021   PILONIDAL CYST / SINUS EXCISION     PORTACATH PLACEMENT Right 06/01/2021   Procedure: INSERTION PORT-A-CATH;  Surgeon: Herbert Pun, MD;  Location: [facility] ORS;  Service: General;  Laterality: Right;    Allergies: Paclitaxel, Amlodipine, and Penicillins  Medications: Prior to Admission medications   Medication Sig Start Date End Date Taking? Authorizing Provider  acetaminophen (TYLENOL) 500 MG tablet Take 1,000 mg by mouth at bedtime.   Yes [provider]  ELIQUIS 5 MG TABS tablet Take 1 tablet (5 mg total) by mouth 2 (two) times daily. 09/19/21  Yes Earlie Server, MD  metoCLOPramide (REGLAN) 10 MG tablet Take 1 tablet (10 mg total) by mouth in the morning, at noon, and at bedtime for 14 days. 09/25/21 10/09/21 Yes Enzo Bi, MD  oxyCODONE (OXY IR/ROXICODONE) 5 MG immediate release tablet Take 1 tablet (5 mg total) by mouth every 6 (six) hours as needed for severe pain. 09/14/21  Yes Verlon Au, NP  albuterol (VENTOLIN HFA) 108 (90 Base) MCG/ACT inhaler Inhale 1-2  puffs into the lungs every 6 (six) hours as needed for wheezing or shortness of breath. 03/03/21   [provider]  clonazePAM (KLONOPIN) 0.5 MG tablet Take 1 tablet (0.5 mg total) by mouth daily as needed for anxiety. 09/29/21   Earlie Server, MD  feeding supplement (ENSURE ENLIVE / ENSURE PLUS) LIQD Take 237 mLs by mouth 2 (two) times daily between meals. 09/08/21   Loletha Grayer, MD  lidocaine-prilocaine  (EMLA) cream Apply 1 application topically daily as needed (pain). Home med 09/25/21   Enzo Bi, MD  prochlorperazine (COMPAZINE) 10 MG tablet Take 1 tablet (10 mg total) by mouth every 6 (six) hours as needed (Nausea or vomiting). Patient not taking: Reported on 10/03/2021 09/19/21   Earlie Server, MD     Family History  Problem Relation Age of Onset   Breast cancer Mother 39   Breast cancer Maternal Aunt        d. under 17    Social History   Socioeconomic History   Marital status: Married    Spouse name: Barnabas Lister   Number of children: 2   Years of education: Not on file   Highest education level: Not on file  Occupational History   Not on file  Tobacco Use   Smoking status: Never   Smokeless tobacco: Never  Vaping Use   Vaping Use: Never used  Substance and Sexual Activity   Alcohol use: No   Drug use: No   Sexual activity: Not Currently    Birth control/protection: Surgical  Other Topics Concern   Not on file  Social History Narrative   Not on file   Social Determinants of Health   Financial Resource Strain: Not on file  Food Insecurity: Not on file  Transportation Needs: Not on file  Physical Activity: Not on file  Stress: Not on file  Social Connections: Not on file    Review of Systems: A 12 point ROS discussed and pertinent positives are indicated in the HPI above.  All other systems are negative.  Review of Systems  Constitutional:  Negative for fatigue and fever.  HENT:  Negative for congestion.   Respiratory:  Negative for cough and shortness of breath.   Gastrointestinal:  Positive for abdominal pain. Negative for diarrhea, nausea and vomiting.  Musculoskeletal:        Generalized pain.   Vital Signs: BP (!) 93/59 (BP Location: Left Arm)    Pulse (!) 59    Temp (!) 97.5 F (36.4 C) (Oral)    Resp 17    Ht 5\' 1"  (1.549 m)    Wt 175 lb 11.3 oz (79.7 kg)    SpO2 99%    BMI 33.20 kg/m   Physical Exam Vitals and nursing note reviewed.  Constitutional:       Appearance: She is well-developed. She is ill-appearing.  HENT:     Head: Normocephalic and atraumatic.  Eyes:     Conjunctiva/sclera: Conjunctivae normal.  Pulmonary:     Effort: Pulmonary effort is normal.  Abdominal:     General: There is distension.  Musculoskeletal:        General: Normal range of motion.     Cervical back: Normal range of motion.  Skin:    General: Skin is warm.  Neurological:     Mental Status: She is alert and oriented to person, place, and time.    Imaging: CT ABDOMEN PELVIS WO CONTRAST  Result Date: 10/04/2021 CLINICAL DATA:  History of ovarian cancer with  mid abdominal pain. EXAM: CT ABDOMEN AND PELVIS WITHOUT CONTRAST TECHNIQUE: Multidetector CT imaging of the abdomen and pelvis was performed following the standard protocol without IV contrast. RADIATION DOSE REDUCTION: This exam was performed according to the departmental dose-optimization program which includes automated exposure control, adjustment of the mA and/or kV according to patient size and/or use of iterative reconstruction technique. COMPARISON:  August 31, 2021 FINDINGS: Lower chest: Mild atelectasis and/or early infiltrate is seen within the bilateral lung bases. Hepatobiliary: No focal liver abnormality is seen. Status post cholecystectomy. No biliary dilatation. Pancreas: Unremarkable. No pancreatic ductal dilatation or surrounding inflammatory changes. Spleen: Normal in size without focal abnormality. Adrenals/Urinary Tract: Adrenal glands are unremarkable. Kidneys are normal, without renal calculi, focal lesion, or hydronephrosis. Bladder is unremarkable. Stomach/Bowel: Stomach is within normal limits. Appendix appears normal. Numerous markedly dilated proximal small bowel loops are seen within the mid and upper left abdomen (the largest measures approximately 4.7 cm in caliber). A transition zone is seen within the anterior aspect of the mid abdomen (axial CT images 50 through 56, CT series 2).  Vascular/Lymphatic: Aortic atherosclerosis. No enlarged abdominal or pelvic lymph nodes. Reproductive: A very large, approximately 15.6 cm x 11.9 cm x 15.3 cm partially calcified, complex solid and cystic mass lesion is again seen extending from the right adnexa into the mid to lower right abdomen. Other: No abdominal wall hernia or abnormality. A large amount of abdominopelvic ascites is seen (approximately 7.29 Hounsfield units). Numerous tiny soft tissue nodules are seen scattered throughout the mesentery of the anterior mid to upper abdomen (axial CT images 38 through 46, CT series 2). Musculoskeletal: No acute or significant osseous findings. IMPRESSION: 1. Findings consistent with a high-grade, proximal partial small bowel obstruction bowel obstruction. 2. Large amount of abdominopelvic ascites with numerous tiny soft tissue nodules scattered throughout the mesentery of the anterior mid to upper abdomen, consistent with peritoneal carcinomatosis. 3. Large, complex solid and cystic mass lesion extending from the right adnexa into the mid to lower right abdomen, consistent with the patient's history of ovarian cancer. 4. Mild atelectasis and/or early infiltrate within the bilateral lung bases. 5. Aortic atherosclerosis. Aortic Atherosclerosis (ICD10-I70.0). Electronically Signed   By: Virgina Norfolk M.D.   On: 10/04/2021 02:49   DG Chest 2 View  Result Date: 09/21/2021 CLINICAL DATA:  Vomiting and weakness.  History of ovarian cancer. EXAM: CHEST - 2 VIEW COMPARISON:  Chest x-ray dated June 01, 2021. FINDINGS: Unchanged right chest wall port catheter. The heart size and mediastinal contours are within normal limits. Normal pulmonary vascularity. No focal consolidation, pleural effusion, or pneumothorax. No acute osseous abnormality. IMPRESSION: 1. No acute cardiopulmonary disease. Electronically Signed   By: Titus Dubin M.D.   On: 09/21/2021 11:10   DG Abd 1 View  Result Date:  09/22/2021 CLINICAL DATA:  Ovarian cancer with malignant ascites. Obstructive sleep apnea. Nausea and vomiting. EXAM: ABDOMEN - 1 VIEW COMPARISON:  09/07/2021 FINDINGS: Cholecystectomy clips noted. The bowel gas pattern is normal. No radio-opaque calculi or other significant radiographic abnormality are seen. IMPRESSION: Normal bowel gas pattern. Electronically Signed   By: Kerby Moors M.D.   On: 09/22/2021 18:12   DG Abd 1 View  Result Date: 09/07/2021 CLINICAL DATA:  Nausea, bloating EXAM: ABDOMEN - 1 VIEW COMPARISON:  09/02/2021 FINDINGS: The bowel gas pattern is normal. Cholecystectomy clips. Pelvic phleboliths. Minimal spurring in the lumbar spine. IMPRESSION: Normal bowel gas pattern.  No acute findings. Electronically Signed   By: Lucrezia Europe  M.D.   On: 09/07/2021 10:39   CT CHEST WO CONTRAST  Result Date: 10/04/2021 CLINICAL DATA:  Chest pain and shortness of breath. EXAM: CT CHEST WITHOUT CONTRAST TECHNIQUE: Multidetector CT imaging of the chest was performed following the standard protocol without IV contrast. RADIATION DOSE REDUCTION: This exam was performed according to the departmental dose-optimization program which includes automated exposure control, adjustment of the mA and/or kV according to patient size and/or use of iterative reconstruction technique. COMPARISON:  June 25, 2021 FINDINGS: Cardiovascular: There is stable right-sided venous Port-A-Cath positioning. No significant vascular findings. Normal heart size. No pericardial effusion. Mediastinum/Nodes: No enlarged mediastinal or axillary lymph nodes. Heterogeneous material is seen throughout the lumen of a mildly distended esophagus. The thyroid gland and trachea demonstrate no significant findings. Lungs/Pleura: Mild to moderate severity atelectasis and/or infiltrate is seen within the inferior aspect of the right upper lobe and posterior aspects of the bilateral lower lobes. This is new when compared to the prior study. There  is no evidence of a pleural effusion or pneumothorax. Upper Abdomen: Moderate severity free fluid is seen within the abdomen. This is markedly decreased in severity within the perihepatic region and increased in severity within the anterior abdomen and anterolateral left upper quadrant. Surgical clips are noted within the gallbladder fossa. Markedly dilated small bowel loops are suspected within the left upper quadrant. Musculoskeletal: No chest wall mass or suspicious bone lesions identified. IMPRESSION: 1. Mild to moderate severity areas of right upper lobe and bilateral lower lobe atelectasis and/or infiltrate. 2. Moderate severity free fluid within the abdomen. This is markedly decreased in severity within the perihepatic region and increased in severity within the anterior abdomen and anterolateral left upper quadrant. 3. Findings suspicious for the presence of a small bowel obstruction. Correlation with abdomen pelvis CT is recommended. Electronically Signed   By: Virgina Norfolk M.D.   On: 10/04/2021 01:03   US RENAL  Result Date: 09/21/2021 CLINICAL DATA:  Acute renal failure. EXAM: RENAL / URINARY TRACT ULTRASOUND COMPLETE COMPARISON:  None. FINDINGS: Right Kidney: Renal measurements: 9.7 cm x 4.9 cm x 4.5 cm = volume: 109.5 mL. Echogenicity within normal limits. No mass or hydronephrosis visualized. Left Kidney: Renal measurements: 9.9 cm x 4.5 cm x 4.3 cm = volume: 101.03 mL. Echogenicity within normal limits. No mass or hydronephrosis visualized. Bladder: Appears normal for degree of bladder distention. Other: None. IMPRESSION: Normal renal ultrasound. Electronically Signed   By: Virgina Norfolk M.D.   On: 09/21/2021 22:33   US Paracentesis  Result Date: 09/28/2021 INDICATION: Recurrent ascites secondary to ovarian cancer. Request for therapeutic paracentesis up to 4 L. EXAM: ULTRASOUND GUIDED PARACENTESIS MEDICATIONS: 1% lidocaine 10 mL COMPLICATIONS: None immediate. PROCEDURE: Informed  written consent was obtained from the patient after a discussion of the risks, benefits and alternatives to treatment. A timeout was performed prior to the initiation of the procedure. Initial ultrasound scanning demonstrates a large amount of ascites within the left lateral abdomen. The left lateral abdomen was prepped and draped in the usual sterile fashion. 1% lidocaine was used for local anesthesia. Following this, a 19 gauge, 7-cm, Yueh catheter was introduced. An ultrasound image was saved for documentation purposes. The paracentesis was performed. The catheter was removed and a dressing was applied. The patient tolerated the procedure well without immediate post procedural complication. FINDINGS: A total of approximately 4 L of clear yellow fluid was removed. IMPRESSION: Successful ultrasound-guided paracentesis yielding 4 liters of peritoneal fluid. Read by: Gareth Eagle, PA-C Electronically  Signed   By: Albin Felling M.D.   On: 09/28/2021 15:12   US Paracentesis  Result Date: 09/21/2021 INDICATION: Patient with a history of ovarian cancer and recurrent large volume ascites presents today for therapeutic diagnostic paracentesis. EXAM: ULTRASOUND GUIDED PARACENTESIS MEDICATIONS: 1% lidocaine 10 mL COMPLICATIONS: None immediate. PROCEDURE: Informed written consent was obtained from the patient after a discussion of the risks, benefits and alternatives to treatment. A timeout was performed prior to the initiation of the procedure. Initial ultrasound scanning demonstrates a large amount of ascites within the right lower abdominal quadrant. The right lower abdomen was prepped and draped in the usual sterile fashion. 1% lidocaine was used for local anesthesia. Following this, a 19 gauge, 7-cm, Yueh catheter was introduced. An ultrasound image was saved for documentation purposes. The paracentesis was performed. The catheter was removed and a dressing was applied. The patient tolerated the procedure well without  immediate post procedural complication. FINDINGS: A total of approximately 5 L of amber-colored fluid was removed. Samples were sent to the laboratory as requested by the clinical team. IMPRESSION: Successful ultrasound-guided paracentesis yielding 5 liters of peritoneal fluid. Read by: Soyla Dryer, NP Electronically Signed   By: Aletta Edouard M.D.   On: 09/21/2021 17:15   US Paracentesis  Result Date: 09/13/2021 INDICATION: ascites EXAM: ULTRASOUND GUIDED  PARACENTESIS MEDICATIONS: None. COMPLICATIONS: None immediate. PROCEDURE: Informed written consent was obtained from the patient after a discussion of the risks, benefits and alternatives to treatment. A timeout was performed prior to the initiation of the procedure. Initial ultrasound scanning demonstrates a large amount of ascites within the right lower abdominal quadrant. The right lower abdomen was prepped and draped in the usual sterile fashion. 1% lidocaine was used for local anesthesia. Following this, a 19 gauge, 10-cm, Yueh catheter was introduced. An ultrasound image was saved for documentation purposes. The paracentesis was performed. The catheter was removed and a dressing was applied. The patient tolerated the procedure well without immediate post procedural complication. FINDINGS: A total of approximately 4.0 of dark amber fluid was removed. IMPRESSION: Successful ultrasound-guided paracentesis yielding approximately 4.0 liters of dark amber peritoneal fluid. Electronically Signed   By: Albin Felling M.D.   On: 09/13/2021 08:31   US Paracentesis  Result Date: 09/07/2021 INDICATION: History of ovarian cancer. Please perform ultrasound-guided paracentesis for therapeutic purposes. EXAM: ULTRASOUND-GUIDED PARACENTESIS COMPARISON:  Multiple previous ultrasound-guided paracenteses, most recently on 08/31/2021 yielding sphenoid cc of ascitic fluid MEDICATIONS: None. COMPLICATIONS: None immediate. TECHNIQUE: Informed written consent was  obtained from the patient after a discussion of the risks, benefits and alternatives to treatment. A timeout was performed prior to the initiation of the procedure. Initial ultrasound scanning demonstrates a large amount of ascites within the right lower abdomen which was subsequently prepped and draped in the usual sterile fashion. 1% lidocaine with epinephrine was used for local anesthesia. Under direct ultrasound guidance, a 19 gauge, 7-cm, Yueh catheter was introduced. An ultrasound image was saved for documentation purposed. The paracentesis was performed. The catheter was removed and a dressing was applied. The patient tolerated the procedure well without immediate post procedural complication. FINDINGS: A total of approximately 4.6 liters of blood-tinged serous fluid was removed. Samples were sent to the laboratory as requested by the clinical team. IMPRESSION: Successful ultrasound-guided paracentesis yielding 4.6 liters of peritoneal fluid. Electronically Signed   By: Sandi Mariscal M.D.   On: 09/07/2021 16:29   ECHOCARDIOGRAM COMPLETE  Result Date: 10/04/2021    ECHOCARDIOGRAM REPORT   Patient  Name:   SUSANNE BAUMGARNER L[place]ato Date of Exam: 10/04/2021 Medical Rec #:  [id number]              Height:       61.0 in Accession #:    [id number]             Weight:       175.7 lb Date of Birth:  03/12/1961               BSA:          1.788 m Patient Age:    33 years               BP:           116/75 mmHg Patient Gender: F                      HR:           87 bpm. Exam Location:  [facility] Procedure: 2D Echo, Color Doppler, Cardiac Doppler and Strain Analysis Indications:     R06.00 Dyspnea  History:         Patient has no prior history of Echocardiogram examinations.                  Risk Factors:Sleep Apnea. Hx of ovarian cancer and cirrhosis of                  liver.  Sonographer:     Charmayne Sheer Referring Phys:  [place] Diagnosing Phys: Ida Rogue MD  Sonographer Comments: No subcostal window and  suboptimal parasternal window. Global longitudinal strain was attempted. IMPRESSIONS  1. Left ventricular ejection fraction, by estimation, is 60 to 65%. The left ventricle has normal function. The left ventricle has no regional wall motion abnormalities. Left ventricular diastolic parameters are consistent with Grade II diastolic dysfunction (pseudonormalization). The average left ventricular global longitudinal strain is -23.3 %. The global longitudinal strain is normal.  2. Right ventricular systolic function is normal. The right ventricular size is normal. Tricuspid regurgitation signal is inadequate for assessing PA pressure.  3. The mitral valve is normal in structure. No evidence of mitral valve regurgitation. No evidence of mitral stenosis.  4. The aortic valve was not well visualized. Aortic valve regurgitation is not visualized. No aortic stenosis is present.  5. The inferior vena cava is normal in size with greater than 50% respiratory variability, suggesting right atrial pressure of 3 mmHg. FINDINGS  Left Ventricle: Left ventricular ejection fraction, by estimation, is 60 to 65%. The left ventricle has normal function. The left ventricle has no regional wall motion abnormalities. The average left ventricular global longitudinal strain is -23.3 %. The global longitudinal strain is normal. The left ventricular internal cavity size was normal in size. There is no left ventricular hypertrophy. Left ventricular diastolic parameters are consistent with Grade II diastolic dysfunction (pseudonormalization). Right Ventricle: The right ventricular size is normal. No increase in right ventricular wall thickness. Right ventricular systolic function is normal. Tricuspid regurgitation signal is inadequate for assessing PA pressure. Left Atrium: Left atrial size was normal in size. Right Atrium: Right atrial size was normal in size. Pericardium: There is no evidence of pericardial effusion. Mitral Valve: The mitral  valve is normal in structure. No evidence of mitral valve regurgitation. No evidence of mitral valve stenosis. Tricuspid Valve: The tricuspid valve is normal in structure. Tricuspid valve regurgitation is not demonstrated. No evidence of tricuspid stenosis. Aortic  Valve: The aortic valve was not well visualized. Aortic valve regurgitation is not visualized. No aortic stenosis is present. Aortic valve mean gradient measures 4.0 mmHg. Aortic valve peak gradient measures 7.3 mmHg. Aortic valve area, by VTI measures 2.50 cm. Pulmonic Valve: The pulmonic valve was normal in structure. Pulmonic valve regurgitation is not visualized. No evidence of pulmonic stenosis. Aorta: The aortic root is normal in size and structure. Venous: The inferior vena cava is normal in size with greater than 50% respiratory variability, suggesting right atrial pressure of 3 mmHg. IAS/Shunts: No atrial level shunt detected by color flow Doppler.  LEFT VENTRICLE PLAX 2D LVIDd:         3.69 cm   Diastology LVIDs:         2.51 cm   LV e' medial:    6.42 cm/s LV PW:         1.02 cm   LV E/e' medial:  11.8 LV IVS:        0.63 cm   LV e' lateral:   10.10 cm/s LVOT diam:     1.90 cm   LV E/e' lateral: 7.5 LV SV:         48 LV SV Index:   27        2D Longitudinal Strain LVOT Area:     2.84 cm  2D Strain GLS Avg:     -23.3 %  RIGHT VENTRICLE RV Basal diam:  2.52 cm LEFT ATRIUM             Index        RIGHT ATRIUM          Index LA diam:        3.50 cm 1.96 cm/m   RA Area:     9.34 cm LA Vol (A2C):   19.8 ml 11.08 ml/m  RA Volume:   16.50 ml 9.23 ml/m LA Vol (A4C):   19.3 ml 10.80 ml/m LA Biplane Vol: 19.7 ml 11.02 ml/m  AORTIC VALVE                    PULMONIC VALVE AV Area (Vmax):    2.12 cm     PV Vmax:       1.06 m/s AV Area (Vmean):   2.32 cm     PV Vmean:      72.100 cm/s AV Area (VTI):     2.50 cm     PV VTI:        0.176 m AV Vmax:           135.00 cm/s  PV Peak grad:  4.5 mmHg AV Vmean:          89.800 cm/s  PV Mean grad:  2.0 mmHg  AV VTI:            0.194 m AV Peak Grad:      7.3 mmHg AV Mean Grad:      4.0 mmHg LVOT Vmax:         101.00 cm/s LVOT Vmean:        73.600 cm/s LVOT VTI:          0.171 m LVOT/AV VTI ratio: 0.88  AORTA Ao Root diam: 3.40 cm MITRAL VALVE MV Area (PHT): 4.54 cm    SHUNTS MV Decel Time: 167 msec    Systemic VTI:  0.17 m MV E velocity: 75.80 cm/s  Systemic Diam: 1.90 cm MV A velocity: 62.60 cm/s MV E/A ratio:  1.21  Ida Rogue MD Electronically signed by Ida Rogue MD Signature Date/Time: 10/04/2021/6:37:13 PM    Final     Labs:  CBC: Recent Labs    09/25/21 0537 10/03/21 1435 10/03/21 1604 10/04/21 0300  WBC 17.8* 25.5* 28.0* 24.5*  HGB 9.3* 10.9* 10.9* 9.3*  HCT 27.1* 32.7* 33.2* 27.3*  PLT 350 411* 419* 321    COAGS: Recent Labs    08/15/21 1534 08/31/21 1912 09/07/21 1321 10/03/21 1604 10/03/21 2224  INR 1.2 1.1 1.3* 1.6*  --   APTT 38* 30  --   --  41*    BMP: Recent Labs    09/25/21 0537 10/03/21 1435 10/03/21 1604 10/04/21 0300  NA 129* 124* 123* 124*  K 3.3* 4.5 4.8 4.8  CL 97* 85* 85* 88*  CO2 24 25 25 25   GLUCOSE 95 166* 159* 102*  BUN 25* 38* 40* 41*  CALCIUM 8.4* 9.5 9.9 8.7*  CREATININE 1.18* 2.04* 2.15* 1.96*  GFRNONAA 53* 27* 26* 29*    LIVER FUNCTION TESTS: Recent Labs    09/21/21 0849 09/21/21 1400 09/22/21 0750 10/03/21 1435 10/04/21 0300  BILITOT 0.6 0.9  --  0.6 0.9  AST 37 39  --  45* 31  ALT 15 16  --  21 16  ALKPHOS 136* 127*  --  178* 135*  PROT 6.2* 5.8*  --  6.1* 5.0*  ALBUMIN 2.4* 2.4* 2.5* 2.0* <1.5*     Assessment and Plan:  60 y.o. female inpatient. History of GERD, Cirrhosis, HTN, PE, (on eliquis), ovarian cancer with recurrent ascites and peritoneal carcinomatosis. Presented tot the ED at [facility] with abdominal pain and intractable nausea and vomiting. Found to have a bowel obstruction, ascites and AKI. Due to the aggressive nature of her disease the Patient has elected for in home hospice. Team is requesting a  abdominal pleurX catheter for ongoing recurrent ascites.   Most recent paracentesis was on 1.13.23 with 4L ( the maximum requested) removed. CT Abd pelvis from 1.19.23 shows ascites. Patient is on lovenox 80 mg last dose given on 1.19.23 @ 1407. Home medication eliquis was last taken prior to admission. BUN 41, Cr 1.96.,, Alkaline Phosphatase 135 Albumin < 1.5, GFR <29, WBC is 24.5 Lactic acid 1.1. pro calcitonin 2.0. All other  lab and medications are within acceptable parameters. Patient is afebrile. Allergies include PCN.  NPO since midnight.  Case reviewed and approved by Dr. Serafina Royals   Risks and benefits discussed with the patient including bleeding, infection, damage to adjacent structures, malfunction of the catheter with need for additional procedures.  All of the patient's questions were answered, patient is agreeable to proceed. Consent signed and in chart.   Thank you for this interesting consult.  I greatly enjoyed meeting Gene Colee and look forward to participating in their care.  A copy of this report was sent to the requesting provider on this date.  Electronically Signed: Jacqualine Mau, NP 10/05/2021, 8:55 AM   I spent a total of 40 Minutes    in face to face in clinical consultation, greater than 50% of which was counseling/coordinating care for abdominal PleurX

## 2021-10-05 NOTE — Progress Notes (Signed)
[facility]) Hospital Liaison Note   Notified plan is for patient to discharge home with hospice services on 1.21.23. DME has been delivered to home. Hospital liaison will continue to follow for discharge disposition.   Please do not hesitate to call with any hospice related questions.    Thank you,   Bobbie "Loren Racer, [place], BSN [facility] Liaison [phone]

## 2021-10-05 NOTE — Procedures (Signed)
Interventional Radiology Procedure Note  Date of Procedure: 10/05/2021  Procedure: Abdominal PleurX placement   Findings:  1. Successful abdominal PleurX catheter placement for malignant ascites   2. Paracentesis with 4L drained, straw colored fluid   Complications: No immediate complications noted.   Estimated Blood Loss: minimal  Follow-up and Recommendations: 1. Ready for use   Albin Felling, MD  Vascular & Interventional Radiology  10/05/2021 3:23 PM

## 2021-10-05 NOTE — Progress Notes (Signed)
PROGRESS NOTE  Shaquanta Harkless    DOB: 01/11/1961, 61 y.o.  TFT:[id number]  PCP: Adin Hector, MD   Code Status: DNR   DOA: 10/03/2021   LOS: 1  Brief Narrative of Current Hospitalization  Mykah Bellomo is a 61 y.o. female with a PMH significant for HTN, glaucoma, OSA, metastatic ovarian cancer, h/o PE, anemia, CKDIII. They presented from home to the ED on 10/03/2021 with N/V which has been chronic in setting of chemotherapy and advanced ovarian cancer but has gotten acutely worse in past week. In the ED, it was found that they had high grade SBO on abdominal CT. They were treated with analgesia and antiemetics PRN. She was kept NPO and general surgery was called for evaluation.  Patient was admitted to medicine service for further workup and management of SBO as outlined in detail below.  10/05/21 -stable  Assessment & Plan  Principal Problem:   Nausea and vomiting Active Problems:   Essential hypertension   Glaucoma (increased eye pressure)   Obstructive sleep apnea syndrome   Ovarian cancer ([place])   Pulmonary embolus (HCC)   Anemia   Sepsis (HCC)   Hyponatremia   Acute renal failure superimposed on stage 3a chronic kidney disease (HCC)   Small bowel obstruction (HCC)   Hypotension   Weakness   AKI (acute kidney injury) ([place])  High grade SBO as seen on abdominal CT   intractable N/V- patient states that she is starting to feel like she could tolerate eating some vanilla pudding. After her procedure today, could potentially start on a clear liquid diet to see how she does.  - antiemetics PRN - NPO - surgery consulted  Ovarian cancer/ascites- See Dr. Tasia Catchings clinic note from 1/18. Patient has decided to pursue hospice care.  - IR consulted for tunneled peritoneal catheter for therapeutic paracentesis at home which is planned for today.  - Dr. Tasia Catchings following, appreciate recs  Hypotension with h/o HTN- stable. responsive to IV fluid boluses. Patient is asymptomatic.   - Consider midodrine if continues to be low to avoid fluid overload.  - monitor - hold home antihypertensives  H/o PE- on eliquis chronically but being held while NPO. Respiratory status stable. - currently on lovenox- can restart on eliquis once tolerating PO  H/o anxiety- holding home PO clonazepam for NPO status. Added on IV ativan PRN  Hyponatremia- chronic, stable. Na [id number]. Patient is tired but has multiple reasons for this so unsure of hyponatremia contribution. Will expect to improve some once able to tolerate PO again. - continue IV fluids.  - consider salt tablets.  - BMP q8hr   AKI on CKD- difficult to assess baseline Cr. Cr 2.04>2.15>1.96 - continue to monitor   OSA- Cont with cpap.   Glaucoma- Cont with home eye drops  DVT prophylaxis: SCDs Start: 10/03/21 1855   Diet:  Diet Orders (From admission, onward)     Start     Ordered   10/05/21 0001  Diet NPO time specified Except for: Sips with Meds  Diet effective midnight       Question:  Except for  Answer:  Sips with Meds   10/04/21 1509            Subjective 10/05/21    Pt reports feeling overall okay today. Denies significant concerns but is happy to hear that she will be getting a peritoneal cath today in place of regular paracentesis. She thinks that she can start trying food later today.  Disposition Plan & Communication  Patient status: Inpatient  Admitted From: Home Disposition: home hospice Anticipated discharge date: TBD  Family Communication: daughter at bedside  Consults, Procedures, Significant Events  Consultants:  General surgery Palliative Hospice IR  Procedures/significant events:  1/20- tunneled peritoneal catheter placed  Antimicrobials:  Anti-infectives (From admission, onward)    Start     Dose/Rate Route Frequency Ordered Stop   10/06/21 0300  vancomycin (VANCOREADY) IVPB 750 mg/150 mL  Status:  Discontinued        750 mg 150 mL/hr over 60 Minutes Intravenous  Every 48 hours 10/04/21 0148 10/04/21 0751   10/04/21 0245  meropenem (MERREM) 1 g in sodium chloride 0.9 % 100 mL IVPB  Status:  Discontinued        1 g 200 mL/hr over 30 Minutes Intravenous Every 12 hours 10/04/21 0159 10/04/21 0751       Objective   Vitals:   10/04/21 1613 10/04/21 2056 10/04/21 2333 10/05/21 0507  BP: (!) 77/65 (!) 88/57 (!) 85/65 100/69  Pulse: 92 85 78 75  Resp: 16 16 16 16   Temp: (!) 97.5 F (36.4 C) 98.8 F (37.1 C) 98.2 F (36.8 C) 97.7 F (36.5 C)  TempSrc: Oral Oral Oral   SpO2: 95% 94% 96% 98%  Weight:      Height:        Intake/Output Summary (Last 24 hours) at 10/05/2021 0729 Last data filed at 10/04/2021 1700 Gross per 24 hour  Intake 460.6 ml  Output --  Net 460.6 ml    Filed Weights   10/03/21 2317  Weight: 79.7 kg    Patient BMI: Body mass index is 33.2 kg/m.   Physical Exam:  General: awake, alert, NAD HEENT: atraumatic, clear conjunctiva, anicteric sclera, MMM, hearing grossly normal Respiratory: normal respiratory effort. Cardiovascular: normal S1/S2, RRR, no JVD, murmurs, quick capillary refill  Gastrointestinal: distended, soft. Endorses tenderness diffusely but no rebound or guarding Nervous: A&O x3. no gross focal neurologic deficits, normal speech Extremities: moves all equally, no edema, normal tone Skin: dry, intact, normal temperature, normal color. No rashes, lesions or ulcers on exposed skin Psychiatry: normal mood, congruent affect  Labs   I have personally reviewed following labs and imaging studies Admission on 10/03/2021  Component Date Value Ref Range Status   Sodium 10/03/2021 123 (L)  135 - 145 mmol/L Final   Potassium 10/03/2021 4.8  3.5 - 5.1 mmol/L Final   Chloride 10/03/2021 85 (L)  98 - 111 mmol/L Final   CO2 10/03/2021 25  22 - 32 mmol/L Final   Glucose, Bld 10/03/2021 159 (H)  70 - 99 mg/dL Final   BUN 10/03/2021 40 (H)  8 - 23 mg/dL Final   Creatinine, Ser 10/03/2021 2.15 (H)  0.44 - 1.00 mg/dL  Final   Calcium 10/03/2021 9.9  8.9 - 10.3 mg/dL Final   GFR, Estimated 10/03/2021 26 (L)  >60 mL/min Final   Anion gap 10/03/2021 13  5 - 15 Final   WBC 10/03/2021 28.0 (H)  4.0 - 10.5 K/uL Final   RBC 10/03/2021 3.48 (L)  3.87 - 5.11 MIL/uL Final   Hemoglobin 10/03/2021 10.9 (L)  12.0 - 15.0 g/dL Final   HCT 10/03/2021 33.2 (L)  36.0 - 46.0 % Final   MCV 10/03/2021 95.4  80.0 - 100.0 fL Final   MCH 10/03/2021 31.3  26.0 - 34.0 pg Final   MCHC 10/03/2021 32.8  30.0 - 36.0 g/dL Final   RDW 10/03/2021 17.6 (H)  11.5 - 15.5 % Final   Platelets 10/03/2021 419 (H)  150 - 400 K/uL Final   nRBC 10/03/2021 0.0  0.0 - 0.2 % Final   Color, Urine 10/04/2021 AMBER (A)  YELLOW Final   APPearance 10/04/2021 HAZY (A)  CLEAR Final   Specific Gravity, Urine 10/04/2021 1.020  1.005 - 1.030 Final   pH 10/04/2021 5.0  5.0 - 8.0 Final   Glucose, UA 10/04/2021 NEGATIVE  NEGATIVE mg/dL Final   Hgb urine dipstick 10/04/2021 SMALL (A)  NEGATIVE Final   Bilirubin Urine 10/04/2021 NEGATIVE  NEGATIVE Final   Ketones, ur 10/04/2021 NEGATIVE  NEGATIVE mg/dL Final   Protein, ur 10/04/2021 NEGATIVE  NEGATIVE mg/dL Final   Nitrite 10/04/2021 NEGATIVE  NEGATIVE Final   Leukocytes,Ua 10/04/2021 TRACE (A)  NEGATIVE Final   RBC / HPF 10/04/2021 0-5  0 - 5 RBC/hpf Final   WBC, UA 10/04/2021 21-50  0 - 5 WBC/hpf Final   Bacteria, UA 10/04/2021 NONE SEEN  NONE SEEN Final   Squamous Epithelial / LPF 10/04/2021 0-5  0 - 5 Final   Mucus 10/04/2021 PRESENT   Final   Hyaline Casts, UA 10/04/2021 PRESENT   Final   SARS Coronavirus 2 by RT PCR 10/03/2021 NEGATIVE  NEGATIVE Final   Influenza A by PCR 10/03/2021 NEGATIVE  NEGATIVE Final   Influenza B by PCR 10/03/2021 NEGATIVE  NEGATIVE Final   Prothrombin Time 10/03/2021 18.6 (H)  11.4 - 15.2 seconds Final   INR 10/03/2021 1.6 (H)  0.8 - 1.2 Final   Weight 10/04/2021 2,811.31  oz Final   Height 10/04/2021 61  in Final   BP 10/04/2021 116/75  mmHg Final   Ao pk vel  10/04/2021 1.35  m/s Final   AV Area VTI 10/04/2021 2.50  cm2 Final   AR max vel 10/04/2021 2.12  cm2 Final   AV Mean grad 10/04/2021 4.0  mmHg Final   AV Peak grad 10/04/2021 7.3  mmHg Final   S' Lateral 10/04/2021 2.51  cm Final   AV Area mean vel 10/04/2021 2.32  cm2 Final   Area-P 1/2 10/04/2021 4.54  cm2 Final   Lactic Acid, Venous 10/03/2021 1.7  0.5 - 1.9 mmol/L Final   Magnesium 10/03/2021 1.5 (L)  1.7 - 2.4 mg/dL Final   Troponin I (High Sensitivity) 10/03/2021 22 (H)  <18 ng/L Final   Troponin I (High Sensitivity) 10/03/2021 25 (H)  <18 ng/L Final   Glucose-Capillary 10/03/2021 105 (H)  70 - 99 mg/dL Final   aPTT 10/03/2021 41 (H)  24 - 36 seconds Final   Lactic Acid, Venous 10/04/2021 2.1 (HH)  0.5 - 1.9 mmol/L Final   Sodium 10/04/2021 124 (L)  135 - 145 mmol/L Final   Potassium 10/04/2021 4.8  3.5 - 5.1 mmol/L Final   Chloride 10/04/2021 88 (L)  98 - 111 mmol/L Final   CO2 10/04/2021 25  22 - 32 mmol/L Final   Glucose, Bld 10/04/2021 102 (H)  70 - 99 mg/dL Final   BUN 10/04/2021 41 (H)  8 - 23 mg/dL Final   Creatinine, Ser 10/04/2021 1.96 (H)  0.44 - 1.00 mg/dL Final   Calcium 10/04/2021 8.7 (L)  8.9 - 10.3 mg/dL Final   GFR, Estimated 10/04/2021 29 (L)  >60 mL/min Final   Anion gap 10/04/2021 11  5 - 15 Final   Lactic Acid, Venous 10/04/2021 1.1  0.5 - 1.9 mmol/L Final   WBC 10/04/2021 24.5 (H)  4.0 - 10.5 K/uL Final  RBC 10/04/2021 2.93 (L)  3.87 - 5.11 MIL/uL Final   Hemoglobin 10/04/2021 9.3 (L)  12.0 - 15.0 g/dL Final   HCT 10/04/2021 27.3 (L)  36.0 - 46.0 % Final   MCV 10/04/2021 93.2  80.0 - 100.0 fL Final   MCH 10/04/2021 31.7  26.0 - 34.0 pg Final   MCHC 10/04/2021 34.1  30.0 - 36.0 g/dL Final   RDW 10/04/2021 17.3 (H)  11.5 - 15.5 % Final   Platelets 10/04/2021 321  150 - 400 K/uL Final   nRBC 10/04/2021 0.0  0.0 - 0.2 % Final   Neutrophils Relative % 10/04/2021 94  % Final   Neutro Abs 10/04/2021 23.0 (H)  1.7 - 7.7 K/uL Final   Lymphocytes Relative  10/04/2021 2  % Final   Lymphs Abs 10/04/2021 0.6 (L)  0.7 - 4.0 K/uL Final   Monocytes Relative 10/04/2021 3  % Final   Monocytes Absolute 10/04/2021 0.8  0.1 - 1.0 K/uL Final   Eosinophils Relative 10/04/2021 0  % Final   Eosinophils Absolute 10/04/2021 0.0  0.0 - 0.5 K/uL Final   Basophils Relative 10/04/2021 0  % Final   Basophils Absolute 10/04/2021 0.0  0.0 - 0.1 K/uL Final   Immature Granulocytes 10/04/2021 1  % Final   Abs Immature Granulocytes 10/04/2021 0.13 (H)  0.00 - 0.07 K/uL Final   MRSA by PCR Next Gen 10/04/2021 NOT DETECTED  NOT DETECTED Final   Procalcitonin 10/04/2021 2.14  ng/mL Final   Lactic Acid, Venous 10/04/2021 1.2  0.5 - 1.9 mmol/L Final   Magnesium 10/04/2021 2.5 (H)  1.7 - 2.4 mg/dL Final   Total Protein 10/04/2021 5.0 (L)  6.5 - 8.1 g/dL Final   Albumin 10/04/2021 <1.5 (L)  3.5 - 5.0 g/dL Final   AST 10/04/2021 31  15 - 41 U/L Final   ALT 10/04/2021 16  0 - 44 U/L Final   Alkaline Phosphatase 10/04/2021 135 (H)  38 - 126 U/L Final   Total Bilirubin 10/04/2021 0.9  0.3 - 1.2 mg/dL Final   Bilirubin, Direct 10/04/2021 0.3 (H)  0.0 - 0.2 mg/dL Final   Indirect Bilirubin 10/04/2021 0.6  0.3 - 0.9 mg/dL Final   Procalcitonin 10/05/2021 2.00  ng/mL Final    Imaging Studies  CT ABDOMEN PELVIS WO CONTRAST  Result Date: 10/04/2021 CLINICAL DATA:  History of ovarian cancer with mid abdominal pain. EXAM: CT ABDOMEN AND PELVIS WITHOUT CONTRAST TECHNIQUE: Multidetector CT imaging of the abdomen and pelvis was performed following the standard protocol without IV contrast. RADIATION DOSE REDUCTION: This exam was performed according to the departmental dose-optimization program which includes automated exposure control, adjustment of the mA and/or kV according to patient size and/or use of iterative reconstruction technique. COMPARISON:  August 31, 2021 FINDINGS: Lower chest: Mild atelectasis and/or early infiltrate is seen within the bilateral lung bases. Hepatobiliary:  No focal liver abnormality is seen. Status post cholecystectomy. No biliary dilatation. Pancreas: Unremarkable. No pancreatic ductal dilatation or surrounding inflammatory changes. Spleen: Normal in size without focal abnormality. Adrenals/Urinary Tract: Adrenal glands are unremarkable. Kidneys are normal, without renal calculi, focal lesion, or hydronephrosis. Bladder is unremarkable. Stomach/Bowel: Stomach is within normal limits. Appendix appears normal. Numerous markedly dilated proximal small bowel loops are seen within the mid and upper left abdomen (the largest measures approximately 4.7 cm in caliber). A transition zone is seen within the anterior aspect of the mid abdomen (axial CT images 50 through 56, CT series 2). Vascular/Lymphatic: Aortic atherosclerosis. No enlarged  abdominal or pelvic lymph nodes. Reproductive: A very large, approximately 15.6 cm x 11.9 cm x 15.3 cm partially calcified, complex solid and cystic mass lesion is again seen extending from the right adnexa into the mid to lower right abdomen. Other: No abdominal wall hernia or abnormality. A large amount of abdominopelvic ascites is seen (approximately 7.29 Hounsfield units). Numerous tiny soft tissue nodules are seen scattered throughout the mesentery of the anterior mid to upper abdomen (axial CT images 38 through 46, CT series 2). Musculoskeletal: No acute or significant osseous findings. IMPRESSION: 1. Findings consistent with a high-grade, proximal partial small bowel obstruction bowel obstruction. 2. Large amount of abdominopelvic ascites with numerous tiny soft tissue nodules scattered throughout the mesentery of the anterior mid to upper abdomen, consistent with peritoneal carcinomatosis. 3. Large, complex solid and cystic mass lesion extending from the right adnexa into the mid to lower right abdomen, consistent with the patient's history of ovarian cancer. 4. Mild atelectasis and/or early infiltrate within the bilateral lung  bases. 5. Aortic atherosclerosis. Aortic Atherosclerosis (ICD10-I70.0). Electronically Signed   By: Virgina Norfolk M.D.   On: 10/04/2021 02:49   CT CHEST WO CONTRAST  Result Date: 10/04/2021 CLINICAL DATA:  Chest pain and shortness of breath. EXAM: CT CHEST WITHOUT CONTRAST TECHNIQUE: Multidetector CT imaging of the chest was performed following the standard protocol without IV contrast. RADIATION DOSE REDUCTION: This exam was performed according to the departmental dose-optimization program which includes automated exposure control, adjustment of the mA and/or kV according to patient size and/or use of iterative reconstruction technique. COMPARISON:  June 25, 2021 FINDINGS: Cardiovascular: There is stable right-sided venous Port-A-Cath positioning. No significant vascular findings. Normal heart size. No pericardial effusion. Mediastinum/Nodes: No enlarged mediastinal or axillary lymph nodes. Heterogeneous material is seen throughout the lumen of a mildly distended esophagus. The thyroid gland and trachea demonstrate no significant findings. Lungs/Pleura: Mild to moderate severity atelectasis and/or infiltrate is seen within the inferior aspect of the right upper lobe and posterior aspects of the bilateral lower lobes. This is new when compared to the prior study. There is no evidence of a pleural effusion or pneumothorax. Upper Abdomen: Moderate severity free fluid is seen within the abdomen. This is markedly decreased in severity within the perihepatic region and increased in severity within the anterior abdomen and anterolateral left upper quadrant. Surgical clips are noted within the gallbladder fossa. Markedly dilated small bowel loops are suspected within the left upper quadrant. Musculoskeletal: No chest wall mass or suspicious bone lesions identified. IMPRESSION: 1. Mild to moderate severity areas of right upper lobe and bilateral lower lobe atelectasis and/or infiltrate. 2. Moderate severity free  fluid within the abdomen. This is markedly decreased in severity within the perihepatic region and increased in severity within the anterior abdomen and anterolateral left upper quadrant. 3. Findings suspicious for the presence of a small bowel obstruction. Correlation with abdomen pelvis CT is recommended. Electronically Signed   By: Virgina Norfolk M.D.   On: 10/04/2021 01:03   ECHOCARDIOGRAM COMPLETE  Result Date: 10/04/2021    ECHOCARDIOGRAM REPORT   Patient Name:   CONCEPTION DOEBLER Date of Exam: 10/04/2021 Medical Rec #:  [id number]              Height:       61.0 in Accession #:    [id number]             Weight:       175.7 lb Date of Birth:  20-Sep-1960  BSA:          1.788 m Patient Age:    78 years               BP:           116/75 mmHg Patient Gender: F                      HR:           87 bpm. Exam Location:  [facility] Procedure: 2D Echo, Color Doppler, Cardiac Doppler and Strain Analysis Indications:     R06.00 Dyspnea  History:         Patient has no prior history of Echocardiogram examinations.                  Risk Factors:Sleep Apnea. Hx of ovarian cancer and cirrhosis of                  liver.  Sonographer:     Charmayne Sheer Referring Phys:  [place] Diagnosing Phys: Ida Rogue MD  Sonographer Comments: No subcostal window and suboptimal parasternal window. Global longitudinal strain was attempted. IMPRESSIONS  1. Left ventricular ejection fraction, by estimation, is 60 to 65%. The left ventricle has normal function. The left ventricle has no regional wall motion abnormalities. Left ventricular diastolic parameters are consistent with Grade II diastolic dysfunction (pseudonormalization). The average left ventricular global longitudinal strain is -23.3 %. The global longitudinal strain is normal.  2. Right ventricular systolic function is normal. The right ventricular size is normal. Tricuspid regurgitation signal is inadequate for assessing PA pressure.  3. The mitral  valve is normal in structure. No evidence of mitral valve regurgitation. No evidence of mitral stenosis.  4. The aortic valve was not well visualized. Aortic valve regurgitation is not visualized. No aortic stenosis is present.  5. The inferior vena cava is normal in size with greater than 50% respiratory variability, suggesting right atrial pressure of 3 mmHg. FINDINGS  Left Ventricle: Left ventricular ejection fraction, by estimation, is 60 to 65%. The left ventricle has normal function. The left ventricle has no regional wall motion abnormalities. The average left ventricular global longitudinal strain is -23.3 %. The global longitudinal strain is normal. The left ventricular internal cavity size was normal in size. There is no left ventricular hypertrophy. Left ventricular diastolic parameters are consistent with Grade II diastolic dysfunction (pseudonormalization). Right Ventricle: The right ventricular size is normal. No increase in right ventricular wall thickness. Right ventricular systolic function is normal. Tricuspid regurgitation signal is inadequate for assessing PA pressure. Left Atrium: Left atrial size was normal in size. Right Atrium: Right atrial size was normal in size. Pericardium: There is no evidence of pericardial effusion. Mitral Valve: The mitral valve is normal in structure. No evidence of mitral valve regurgitation. No evidence of mitral valve stenosis. Tricuspid Valve: The tricuspid valve is normal in structure. Tricuspid valve regurgitation is not demonstrated. No evidence of tricuspid stenosis. Aortic Valve: The aortic valve was not well visualized. Aortic valve regurgitation is not visualized. No aortic stenosis is present. Aortic valve mean gradient measures 4.0 mmHg. Aortic valve peak gradient measures 7.3 mmHg. Aortic valve area, by VTI measures 2.50 cm. Pulmonic Valve: The pulmonic valve was normal in structure. Pulmonic valve regurgitation is not visualized. No evidence of  pulmonic stenosis. Aorta: The aortic root is normal in size and structure. Venous: The inferior vena cava is normal in size with greater than  50% respiratory variability, suggesting right atrial pressure of 3 mmHg. IAS/Shunts: No atrial level shunt detected by color flow Doppler.  LEFT VENTRICLE PLAX 2D LVIDd:         3.69 cm   Diastology LVIDs:         2.51 cm   LV e' medial:    6.42 cm/s LV PW:         1.02 cm   LV E/e' medial:  11.8 LV IVS:        0.63 cm   LV e' lateral:   10.10 cm/s LVOT diam:     1.90 cm   LV E/e' lateral: 7.5 LV SV:         48 LV SV Index:   27        2D Longitudinal Strain LVOT Area:     2.84 cm  2D Strain GLS Avg:     -23.3 %  RIGHT VENTRICLE RV Basal diam:  2.52 cm LEFT ATRIUM             Index        RIGHT ATRIUM          Index LA diam:        3.50 cm 1.96 cm/m   RA Area:     9.34 cm LA Vol (A2C):   19.8 ml 11.08 ml/m  RA Volume:   16.50 ml 9.23 ml/m LA Vol (A4C):   19.3 ml 10.80 ml/m LA Biplane Vol: 19.7 ml 11.02 ml/m  AORTIC VALVE                    PULMONIC VALVE AV Area (Vmax):    2.12 cm     PV Vmax:       1.06 m/s AV Area (Vmean):   2.32 cm     PV Vmean:      72.100 cm/s AV Area (VTI):     2.50 cm     PV VTI:        0.176 m AV Vmax:           135.00 cm/s  PV Peak grad:  4.5 mmHg AV Vmean:          89.800 cm/s  PV Mean grad:  2.0 mmHg AV VTI:            0.194 m AV Peak Grad:      7.3 mmHg AV Mean Grad:      4.0 mmHg LVOT Vmax:         101.00 cm/s LVOT Vmean:        73.600 cm/s LVOT VTI:          0.171 m LVOT/AV VTI ratio: 0.88  AORTA Ao Root diam: 3.40 cm MITRAL VALVE MV Area (PHT): 4.54 cm    SHUNTS MV Decel Time: 167 msec    Systemic VTI:  0.17 m MV E velocity: 75.80 cm/s  Systemic Diam: 1.90 cm MV A velocity: 62.60 cm/s MV E/A ratio:  1.21 Ida Rogue MD Electronically signed by Ida Rogue MD Signature Date/Time: 10/04/2021/6:37:13 PM    Final     Medications   Scheduled Meds:  Chlorhexidine Gluconate Cloth  6 each Topical Q0600   dexamethasone  12 mg  Intravenous Daily   enoxaparin (LOVENOX) injection  1 mg/kg Subcutaneous Daily   metoCLOPramide (REGLAN) injection  5 mg Intravenous Q6H   pantoprazole (PROTONIX) IV  40 mg Intravenous Q12H   sodium chloride flush  3 mL Intravenous Q12H  No recently discontinued medications to reconcile  LOS: 1 day   Richarda Osmond, DO [facility] 10/05/2021, 7:29 AM   Available by Epic secure chat 7AM-7PM. If 7PM-7AM, please contact night-coverage Refer to [email or web address] to contact the [facility] Attending or Consulting provider for this pt

## 2021-10-05 NOTE — TOC Progression Note (Signed)
Transition of Care [facility]) - Progression Note    Patient Details  Name: Rabecka Brendel MRN: [id number] Date of Birth: [birth year]  Transition of Care [facility]) CM/SW [place], RN Phone Number: 10/05/2021, 10:55 AM  Clinical Narrative:   Approved for hospice at home with [facility], patient will have a drain put in prior to discharging home.TOC to follow.         Expected Discharge Plan and Services                                                 Social Determinants of Health (SDOH) Interventions    Readmission Risk Interventions Readmission Risk Prevention Plan 09/25/2021  Transportation Screening Complete  Medication Review [employer]) Complete  PCP or Specialist appointment within 3-5 days of discharge Complete  HRI or [place] Complete  SW Recovery Care/Counseling Consult Not Complete  SW Consult Not Complete Comments RNCM assigned to patient  Palliative Care Screening Not Applicable  Skilled Nursing Facility Complete  Some recent data might be hidden

## 2021-10-05 NOTE — Progress Notes (Signed)
Patient clinically stable post Abdominal Pleurx  catheter placement per Dr Denna Haggard, tolerated well. With stable vitals pre and post procedure. Received Versed 1 mg along with Fentanyl 50 mcg IV for procedure. Report given to [facility] on 1C post procedure/recovery/at bedside.

## 2021-10-05 NOTE — Progress Notes (Signed)
Hematology/Oncology Progress Note [facility] [phone] [phone]  Patient Care Team: Adin Hector, MD as PCP - General (Internal Medicine) Earlie Server, MD as Consulting Physician (Hematology and Oncology) Clent Jacks, RN as Oncology Nurse Navigator   Name of the patient: Brittany Montoya  [id number]  1960/11/20  Date of visit: 10/05/21   INTERVAL HISTORY-  Patient was seen at bedside.  She is scheduled to have peritoneal catheter placement today. No additional nausea vomiting episodes. + Abdomen discomfort.  Allergies  Allergen Reactions   Paclitaxel Shortness Of Breath and Other (See Comments)    pt flush, stated she didn't feel well, taxol stopped (05/10/2021)   Amlodipine Other (See Comments) and Rash    Not effective Not effective    Penicillins Rash    Patient Active Problem List   Diagnosis Date Noted   Small bowel obstruction ([place]) 10/04/2021   Hypotension    Weakness    AKI (acute kidney injury) ([place])    Neoplasm related pain 10/03/2021   Nausea and vomiting 10/03/2021   Palliative care encounter    Acute kidney injury ([place]) 09/23/2021   Hypomagnesemia 09/21/2021   Hypercoagulable state ([place]) 09/21/2021   Hypercalcemia 09/21/2021   Hyperphosphatemia 09/21/2021   Acute renal failure superimposed on stage 3a chronic kidney disease ([place]) 09/07/2021   Abdominal distension 09/07/2021   Hypoalbuminemia due to protein-calorie malnutrition ([place]) 09/07/2021   Hypokalemia 09/07/2021   Transaminitis 09/07/2021   Obesity (BMI 30-39.9) 09/07/2021   Leukocytosis 09/07/2021   Intractable nausea and vomiting    Abdominal pain 08/31/2021   Anemia 08/31/2021   Thrombocytopenia ([place]) 08/31/2021   Sepsis ([place]) 08/31/2021   Hyponatremia 08/31/2021   SBP (spontaneous bacterial peritonitis) ([place]) 08/31/2021   Genetic testing 07/10/2021   Macrocytic anemia 07/05/2021   Chemotherapy induced neutropenia ([place]) 06/15/2021    Constipation, chronic 05/17/2021   Carcinoma of ovary, stage 3, right ([place]) 05/17/2021   Portal venous hypertension ([place]) 05/17/2021   Infusion reaction 05/10/2021   Family history of breast cancer 04/27/2021   Goals of care, counseling/discussion 04/19/2021   Pulmonary embolus ([place]) 04/19/2021   Encounter for antineoplastic chemotherapy 04/19/2021   Depression with anxiety 04/19/2021   Ovarian cancer ([place]) 04/15/2021   Malignant ascites 04/11/2021   Mass of right ovary 03/26/2021   DDD (degenerative disc disease), cervical 03/02/2019   Glaucoma (increased eye pressure) 03/02/2019   Venous stasis 03/02/2019   Other hyperlipidemia 01/21/2019   Facet arthritis of cervical region 04/01/2016   Incomplete tear of left rotator cuff 01/15/2016   Rotator cuff tendinitis, left 01/15/2016   Essential hypertension 10/02/2015   Obstructive sleep apnea syndrome 10/02/2015   Recurrent major depressive disorder, in full remission ([place]) 10/02/2015   Contusion of left knee 10/17/2014     Past Medical History:  Diagnosis Date   Anxiety    Arthritis    Ascites    Cancer associated pain    Cirrhosis of liver (HCC)    DDD (degenerative disc disease), cervical    Depression    Family history of breast cancer    GERD (gastroesophageal reflux disease)    Glaucoma    Headache    migraines   Hypercoagulable state ([employer])    PE 03/2021   Hypertension    Ovarian cancer ([place]) 04/15/2021   PONV (postoperative nausea and vomiting)    Pulmonary embolism ([place]) 04/12/2021   04/12/21 CTA: multiple bilateral lobar, segmental, and subsegmental PEs. Treated with Eliquis.   Sleep apnea  Venous stasis      Past Surgical History:  Procedure Laterality Date   ABDOMINAL HYSTERECTOMY N/A 2008   may have had left ovary removed   [place]     2012, 2015, 2020   COLONOSCOPY N/A 06/29/2021   Procedure: COLONOSCOPY;  Surgeon: Toledo, Benay Pike,  MD;  Location: [facility] ENDOSCOPY;  Service: Gastroenterology;  Laterality: N/A;  DOCTORS ARE IN AGREEMENT THAT TOLEDO CAN DO THIS PROCEDURE IN RUSSO'S BLOCK   ESOPHAGOGASTRODUODENOSCOPY N/A 06/29/2021   Procedure: ESOPHAGOGASTRODUODENOSCOPY (EGD);  Surgeon: Toledo, Benay Pike, MD;  Location: [facility] ENDOSCOPY;  Service: Gastroenterology;  Laterality: N/A;   PARACENTESIS  04/20/2021   PILONIDAL CYST / SINUS EXCISION     PORTACATH PLACEMENT Right 06/01/2021   Procedure: INSERTION PORT-A-CATH;  Surgeon: Herbert Pun, MD;  Location: [facility] ORS;  Service: General;  Laterality: Right;    Social History   Socioeconomic History   Marital status: Married    Spouse name: Barnabas Lister   Number of children: 2   Years of education: Not on file   Highest education level: Not on file  Occupational History   Not on file  Tobacco Use   Smoking status: Never   Smokeless tobacco: Never  Vaping Use   Vaping Use: Never used  Substance and Sexual Activity   Alcohol use: No   Drug use: No   Sexual activity: Not Currently    Birth control/protection: Surgical  Other Topics Concern   Not on file  Social History Narrative   Not on file   Social Determinants of Health   Financial Resource Strain: Not on file  Food Insecurity: Not on file  Transportation Needs: Not on file  Physical Activity: Not on file  Stress: Not on file  Social Connections: Not on file  Intimate Partner Violence: Not on file     Family History  Problem Relation Age of Onset   Breast cancer Mother 80   Breast cancer Maternal Aunt        d. under 50     Current Facility-Administered Medications:    0.9 %  sodium chloride infusion, , Intravenous, Continuous, Richarda Osmond, MD, Last Rate: 150 mL/hr at 10/05/21 0931, New Bag at 10/05/21 0931   albuterol (PROVENTIL) (2.5 MG/3ML) 0.083% nebulizer solution 3 mL, 3 mL, Inhalation, Q6H PRN, Richarda Osmond, MD   Chlorhexidine Gluconate Cloth 2 % PADS 6 each, 6 each, Topical,  Q0600, Richarda Osmond, MD, 6 each at 10/05/21 0603   clonazePAM (KLONOPIN) tablet 0.5 mg, 0.5 mg, Oral, Daily PRN, Richarda Osmond, MD   [START ON 10/06/2021] dexamethasone (DECADRON) injection 12 mg, 12 mg, Intravenous, Daily, Dolan, Carissa E, RPH   enoxaparin (LOVENOX) injection 80 mg, 1 mg/kg, Subcutaneous, Daily, Doristine Mango L, MD, 80 mg at 10/05/21 0947   HYDROmorphone (DILAUDID) injection 0.5 mg, 0.5 mg, Intravenous, Q6H PRN, Richarda Osmond, MD   LORazepam (ATIVAN) injection 0.5 mg, 0.5 mg, Intravenous, Q6H PRN, Richarda Osmond, MD   metoCLOPramide (REGLAN) injection 5 mg, 5 mg, Intravenous, Q6H, Earlie Server, MD, 5 mg at 10/05/21 1201   ondansetron (ZOFRAN) injection 4 mg, 4 mg, Intravenous, Q6H PRN, Sharion Settler, NP, 4 mg at 10/03/21 2149   oxyCODONE (Oxy IR/ROXICODONE) immediate release tablet 5 mg, 5 mg, Oral, Q6H PRN, Richarda Osmond, MD   pantoprazole (PROTONIX) injection 40 mg, 40 mg, Intravenous, Q12H, Para Skeans, MD, 40 mg  at 10/05/21 0925   prochlorperazine (COMPAZINE) tablet 10 mg, 10 mg, Oral, Q6H PRN, Doristine Mango L, MD   sodium chloride flush (NS) 0.9 % injection 3 mL, 3 mL, Intravenous, Q12H, Florina Ou V, MD, 3 mL at 10/05/21 0925   vancomycin (VANCOCIN) IVPB 1000 mg/200 mL premix, 1,000 mg, Intravenous, Once, Jacqualine Mau, NP  Facility-Administered Medications Ordered in Other Encounters:    heparin lock flush 100 unit/mL, 500 Units, Intravenous, Once, Earlie Server, MD   LORazepam (ATIVAN) injection 0.5 mg, 0.5 mg, Intravenous, Once PRN, Earlie Server, MD   sodium chloride flush (NS) 0.9 % injection 10 mL, 10 mL, Intravenous, PRN, Earlie Server, MD, 10 mL at 09/21/21 0849   Physical exam:  Vitals:   10/04/21 2333 10/05/21 0507 10/05/21 0758 10/05/21 1128  BP: (!) 85/65 100/69 (!) 93/59 95/69  Pulse: 78 75 (!) 59 77  Resp: 16 16 17 19   Temp: 98.2 F (36.8 C) 97.7 F (36.5 C) (!) 97.5 F (36.4 C) 97.6 F (36.4 C)  TempSrc: Oral  Oral  Oral  SpO2: 96% 98% 99% 100%  Weight:      Height:       Physical Exam Constitutional:      General: She is not in acute distress.    Appearance: She is obese. She is not diaphoretic.  HENT:     Head: Normocephalic and atraumatic.     Nose: Nose normal.     Mouth/Throat:     Pharynx: No oropharyngeal exudate.  Eyes:     General: No scleral icterus.    Pupils: Pupils are equal, round, and reactive to light.  Cardiovascular:     Rate and Rhythm: Normal rate and regular rhythm.     Heart sounds: No murmur heard. Pulmonary:     Effort: Pulmonary effort is normal. No respiratory distress.     Breath sounds: No rales.  Chest:     Chest wall: No tenderness.  Abdominal:     General: There is distension.     Palpations: Abdomen is soft.  Musculoskeletal:        General: Normal range of motion.     Cervical back: Normal range of motion and neck supple.  Skin:    General: Skin is warm and dry.     Findings: No erythema.  Neurological:     Mental Status: She is alert and oriented to person, place, and time.     Cranial Nerves: No cranial nerve deficit.     Motor: No abnormal muscle tone.     Coordination: Coordination normal.  Psychiatric:        Mood and Affect: Affect normal.       CMP Latest Ref Rng & Units 10/04/2021  Glucose 70 - 99 mg/dL 102(H)  BUN 8 - 23 mg/dL 41(H)  Creatinine 0.44 - 1.00 mg/dL 1.96(H)  Sodium 135 - 145 mmol/L 124(L)  Potassium 3.5 - 5.1 mmol/L 4.8  Chloride 98 - 111 mmol/L 88(L)  CO2 22 - 32 mmol/L 25  Calcium 8.9 - 10.3 mg/dL 8.7(L)  Total Protein 6.5 - 8.1 g/dL 5.0(L)  Total Bilirubin 0.3 - 1.2 mg/dL 0.9  Alkaline Phos 38 - 126 U/L 135(H)  AST 15 - 41 U/L 31  ALT 0 - 44 U/L 16   CBC Latest Ref Rng & Units 10/04/2021  WBC 4.0 - 10.5 K/uL 24.5(H)  Hemoglobin 12.0 - 15.0 g/dL 9.3(L)  Hematocrit 36.0 - 46.0 % 27.3(L)  Platelets 150 - 400 K/uL 321  RADIOGRAPHIC STUDIES: I have personally reviewed the radiological images as listed and  agreed with the findings in the report. CT ABDOMEN PELVIS WO CONTRAST  Result Date: 10/04/2021 CLINICAL DATA:  History of ovarian cancer with mid abdominal pain. EXAM: CT ABDOMEN AND PELVIS WITHOUT CONTRAST TECHNIQUE: Multidetector CT imaging of the abdomen and pelvis was performed following the standard protocol without IV contrast. RADIATION DOSE REDUCTION: This exam was performed according to the departmental dose-optimization program which includes automated exposure control, adjustment of the mA and/or kV according to patient size and/or use of iterative reconstruction technique. COMPARISON:  August 31, 2021 FINDINGS: Lower chest: Mild atelectasis and/or early infiltrate is seen within the bilateral lung bases. Hepatobiliary: No focal liver abnormality is seen. Status post cholecystectomy. No biliary dilatation. Pancreas: Unremarkable. No pancreatic ductal dilatation or surrounding inflammatory changes. Spleen: Normal in size without focal abnormality. Adrenals/Urinary Tract: Adrenal glands are unremarkable. Kidneys are normal, without renal calculi, focal lesion, or hydronephrosis. Bladder is unremarkable. Stomach/Bowel: Stomach is within normal limits. Appendix appears normal. Numerous markedly dilated proximal small bowel loops are seen within the mid and upper left abdomen (the largest measures approximately 4.7 cm in caliber). A transition zone is seen within the anterior aspect of the mid abdomen (axial CT images 50 through 56, CT series 2). Vascular/Lymphatic: Aortic atherosclerosis. No enlarged abdominal or pelvic lymph nodes. Reproductive: A very large, approximately 15.6 cm x 11.9 cm x 15.3 cm partially calcified, complex solid and cystic mass lesion is again seen extending from the right adnexa into the mid to lower right abdomen. Other: No abdominal wall hernia or abnormality. A large amount of abdominopelvic ascites is seen (approximately 7.29 Hounsfield units). Numerous tiny soft tissue  nodules are seen scattered throughout the mesentery of the anterior mid to upper abdomen (axial CT images 38 through 46, CT series 2). Musculoskeletal: No acute or significant osseous findings. IMPRESSION: 1. Findings consistent with a high-grade, proximal partial small bowel obstruction bowel obstruction. 2. Large amount of abdominopelvic ascites with numerous tiny soft tissue nodules scattered throughout the mesentery of the anterior mid to upper abdomen, consistent with peritoneal carcinomatosis. 3. Large, complex solid and cystic mass lesion extending from the right adnexa into the mid to lower right abdomen, consistent with the patient's history of ovarian cancer. 4. Mild atelectasis and/or early infiltrate within the bilateral lung bases. 5. Aortic atherosclerosis. Aortic Atherosclerosis (ICD10-I70.0). Electronically Signed   By: Virgina Norfolk M.D.   On: 10/04/2021 02:49   DG Chest 2 View  Result Date: 09/21/2021 CLINICAL DATA:  Vomiting and weakness.  History of ovarian cancer. EXAM: CHEST - 2 VIEW COMPARISON:  Chest x-ray dated June 01, 2021. FINDINGS: Unchanged right chest wall port catheter. The heart size and mediastinal contours are within normal limits. Normal pulmonary vascularity. No focal consolidation, pleural effusion, or pneumothorax. No acute osseous abnormality. IMPRESSION: 1. No acute cardiopulmonary disease. Electronically Signed   By: Titus Dubin M.D.   On: 09/21/2021 11:10   DG Abd 1 View  Result Date: 09/22/2021 CLINICAL DATA:  Ovarian cancer with malignant ascites. Obstructive sleep apnea. Nausea and vomiting. EXAM: ABDOMEN - 1 VIEW COMPARISON:  09/07/2021 FINDINGS: Cholecystectomy clips noted. The bowel gas pattern is normal. No radio-opaque calculi or other significant radiographic abnormality are seen. IMPRESSION: Normal bowel gas pattern. Electronically Signed   By: Kerby Moors M.D.   On: 09/22/2021 18:12   DG Abd 1 View  Result Date: 09/07/2021 CLINICAL  DATA:  Nausea, bloating EXAM: ABDOMEN - 1 VIEW  COMPARISON:  09/02/2021 FINDINGS: The bowel gas pattern is normal. Cholecystectomy clips. Pelvic phleboliths. Minimal spurring in the lumbar spine. IMPRESSION: Normal bowel gas pattern.  No acute findings. Electronically Signed   By: Lucrezia Europe M.D.   On: 09/07/2021 10:39   CT CHEST WO CONTRAST  Result Date: 10/04/2021 CLINICAL DATA:  Chest pain and shortness of breath. EXAM: CT CHEST WITHOUT CONTRAST TECHNIQUE: Multidetector CT imaging of the chest was performed following the standard protocol without IV contrast. RADIATION DOSE REDUCTION: This exam was performed according to the departmental dose-optimization program which includes automated exposure control, adjustment of the mA and/or kV according to patient size and/or use of iterative reconstruction technique. COMPARISON:  June 25, 2021 FINDINGS: Cardiovascular: There is stable right-sided venous Port-A-Cath positioning. No significant vascular findings. Normal heart size. No pericardial effusion. Mediastinum/Nodes: No enlarged mediastinal or axillary lymph nodes. Heterogeneous material is seen throughout the lumen of a mildly distended esophagus. The thyroid gland and trachea demonstrate no significant findings. Lungs/Pleura: Mild to moderate severity atelectasis and/or infiltrate is seen within the inferior aspect of the right upper lobe and posterior aspects of the bilateral lower lobes. This is new when compared to the prior study. There is no evidence of a pleural effusion or pneumothorax. Upper Abdomen: Moderate severity free fluid is seen within the abdomen. This is markedly decreased in severity within the perihepatic region and increased in severity within the anterior abdomen and anterolateral left upper quadrant. Surgical clips are noted within the gallbladder fossa. Markedly dilated small bowel loops are suspected within the left upper quadrant. Musculoskeletal: No chest wall mass or suspicious  bone lesions identified. IMPRESSION: 1. Mild to moderate severity areas of right upper lobe and bilateral lower lobe atelectasis and/or infiltrate. 2. Moderate severity free fluid within the abdomen. This is markedly decreased in severity within the perihepatic region and increased in severity within the anterior abdomen and anterolateral left upper quadrant. 3. Findings suspicious for the presence of a small bowel obstruction. Correlation with abdomen pelvis CT is recommended. Electronically Signed   By: Virgina Norfolk M.D.   On: 10/04/2021 01:03   US RENAL  Result Date: 09/21/2021 CLINICAL DATA:  Acute renal failure. EXAM: RENAL / URINARY TRACT ULTRASOUND COMPLETE COMPARISON:  None. FINDINGS: Right Kidney: Renal measurements: 9.7 cm x 4.9 cm x 4.5 cm = volume: 109.5 mL. Echogenicity within normal limits. No mass or hydronephrosis visualized. Left Kidney: Renal measurements: 9.9 cm x 4.5 cm x 4.3 cm = volume: 101.03 mL. Echogenicity within normal limits. No mass or hydronephrosis visualized. Bladder: Appears normal for degree of bladder distention. Other: None. IMPRESSION: Normal renal ultrasound. Electronically Signed   By: Virgina Norfolk M.D.   On: 09/21/2021 22:33   US Paracentesis  Result Date: 09/28/2021 INDICATION: Recurrent ascites secondary to ovarian cancer. Request for therapeutic paracentesis up to 4 L. EXAM: ULTRASOUND GUIDED PARACENTESIS MEDICATIONS: 1% lidocaine 10 mL COMPLICATIONS: None immediate. PROCEDURE: Informed written consent was obtained from the patient after a discussion of the risks, benefits and alternatives to treatment. A timeout was performed prior to the initiation of the procedure. Initial ultrasound scanning demonstrates a large amount of ascites within the left lateral abdomen. The left lateral abdomen was prepped and draped in the usual sterile fashion. 1% lidocaine was used for local anesthesia. Following this, a 19 gauge, 7-cm, Yueh catheter was introduced. An  ultrasound image was saved for documentation purposes. The paracentesis was performed. The catheter was removed and a dressing was applied. The patient tolerated  the procedure well without immediate post procedural complication. FINDINGS: A total of approximately 4 L of clear yellow fluid was removed. IMPRESSION: Successful ultrasound-guided paracentesis yielding 4 liters of peritoneal fluid. Read by: Gareth Eagle, PA-C Electronically Signed   By: Albin Felling M.D.   On: 09/28/2021 15:12   US Paracentesis  Result Date: 09/21/2021 INDICATION: Patient with a history of ovarian cancer and recurrent large volume ascites presents today for therapeutic diagnostic paracentesis. EXAM: ULTRASOUND GUIDED PARACENTESIS MEDICATIONS: 1% lidocaine 10 mL COMPLICATIONS: None immediate. PROCEDURE: Informed written consent was obtained from the patient after a discussion of the risks, benefits and alternatives to treatment. A timeout was performed prior to the initiation of the procedure. Initial ultrasound scanning demonstrates a large amount of ascites within the right lower abdominal quadrant. The right lower abdomen was prepped and draped in the usual sterile fashion. 1% lidocaine was used for local anesthesia. Following this, a 19 gauge, 7-cm, Yueh catheter was introduced. An ultrasound image was saved for documentation purposes. The paracentesis was performed. The catheter was removed and a dressing was applied. The patient tolerated the procedure well without immediate post procedural complication. FINDINGS: A total of approximately 5 L of amber-colored fluid was removed. Samples were sent to the laboratory as requested by the clinical team. IMPRESSION: Successful ultrasound-guided paracentesis yielding 5 liters of peritoneal fluid. Read by: Soyla Dryer, NP Electronically Signed   By: Aletta Edouard M.D.   On: 09/21/2021 17:15   US Paracentesis  Result Date: 09/13/2021 INDICATION: ascites EXAM: ULTRASOUND GUIDED   PARACENTESIS MEDICATIONS: None. COMPLICATIONS: None immediate. PROCEDURE: Informed written consent was obtained from the patient after a discussion of the risks, benefits and alternatives to treatment. A timeout was performed prior to the initiation of the procedure. Initial ultrasound scanning demonstrates a large amount of ascites within the right lower abdominal quadrant. The right lower abdomen was prepped and draped in the usual sterile fashion. 1% lidocaine was used for local anesthesia. Following this, a 19 gauge, 10-cm, Yueh catheter was introduced. An ultrasound image was saved for documentation purposes. The paracentesis was performed. The catheter was removed and a dressing was applied. The patient tolerated the procedure well without immediate post procedural complication. FINDINGS: A total of approximately 4.0 of dark amber fluid was removed. IMPRESSION: Successful ultrasound-guided paracentesis yielding approximately 4.0 liters of dark amber peritoneal fluid. Electronically Signed   By: Albin Felling M.D.   On: 09/13/2021 08:31   US Paracentesis  Result Date: 09/07/2021 INDICATION: History of ovarian cancer. Please perform ultrasound-guided paracentesis for therapeutic purposes. EXAM: ULTRASOUND-GUIDED PARACENTESIS COMPARISON:  Multiple previous ultrasound-guided paracenteses, most recently on 08/31/2021 yielding sphenoid cc of ascitic fluid MEDICATIONS: None. COMPLICATIONS: None immediate. TECHNIQUE: Informed written consent was obtained from the patient after a discussion of the risks, benefits and alternatives to treatment. A timeout was performed prior to the initiation of the procedure. Initial ultrasound scanning demonstrates a large amount of ascites within the right lower abdomen which was subsequently prepped and draped in the usual sterile fashion. 1% lidocaine with epinephrine was used for local anesthesia. Under direct ultrasound guidance, a 19 gauge, 7-cm, Yueh catheter was  introduced. An ultrasound image was saved for documentation purposed. The paracentesis was performed. The catheter was removed and a dressing was applied. The patient tolerated the procedure well without immediate post procedural complication. FINDINGS: A total of approximately 4.6 liters of blood-tinged serous fluid was removed. Samples were sent to the laboratory as requested by the clinical team. IMPRESSION: Successful ultrasound-guided  paracentesis yielding 4.6 liters of peritoneal fluid. Electronically Signed   By: Sandi Mariscal M.D.   On: 09/07/2021 16:29   ECHOCARDIOGRAM COMPLETE  Result Date: 10/04/2021    ECHOCARDIOGRAM REPORT   Patient Name:   JANMARIE SMOOT Date of Exam: 10/04/2021 Medical Rec #:  [id number]              Height:       61.0 in Accession #:    [id number]             Weight:       175.7 lb Date of Birth:  05-Apr-1961               BSA:          1.788 m Patient Age:    32 years               BP:           116/75 mmHg Patient Gender: F                      HR:           87 bpm. Exam Location:  [facility] Procedure: 2D Echo, Color Doppler, Cardiac Doppler and Strain Analysis Indications:     R06.00 Dyspnea  History:         Patient has no prior history of Echocardiogram examinations.                  Risk Factors:Sleep Apnea. Hx of ovarian cancer and cirrhosis of                  liver.  Sonographer:     Charmayne Sheer Referring Phys:  [place] Diagnosing Phys: Ida Rogue MD  Sonographer Comments: No subcostal window and suboptimal parasternal window. Global longitudinal strain was attempted. IMPRESSIONS  1. Left ventricular ejection fraction, by estimation, is 60 to 65%. The left ventricle has normal function. The left ventricle has no regional wall motion abnormalities. Left ventricular diastolic parameters are consistent with Grade II diastolic dysfunction (pseudonormalization). The average left ventricular global longitudinal strain is -23.3 %. The global longitudinal strain is  normal.  2. Right ventricular systolic function is normal. The right ventricular size is normal. Tricuspid regurgitation signal is inadequate for assessing PA pressure.  3. The mitral valve is normal in structure. No evidence of mitral valve regurgitation. No evidence of mitral stenosis.  4. The aortic valve was not well visualized. Aortic valve regurgitation is not visualized. No aortic stenosis is present.  5. The inferior vena cava is normal in size with greater than 50% respiratory variability, suggesting right atrial pressure of 3 mmHg. FINDINGS  Left Ventricle: Left ventricular ejection fraction, by estimation, is 60 to 65%. The left ventricle has normal function. The left ventricle has no regional wall motion abnormalities. The average left ventricular global longitudinal strain is -23.3 %. The global longitudinal strain is normal. The left ventricular internal cavity size was normal in size. There is no left ventricular hypertrophy. Left ventricular diastolic parameters are consistent with Grade II diastolic dysfunction (pseudonormalization). Right Ventricle: The right ventricular size is normal. No increase in right ventricular wall thickness. Right ventricular systolic function is normal. Tricuspid regurgitation signal is inadequate for assessing PA pressure. Left Atrium: Left atrial size was normal in size. Right Atrium: Right atrial size was normal in size. Pericardium: There is no evidence of pericardial effusion. Mitral Valve: The mitral valve is  normal in structure. No evidence of mitral valve regurgitation. No evidence of mitral valve stenosis. Tricuspid Valve: The tricuspid valve is normal in structure. Tricuspid valve regurgitation is not demonstrated. No evidence of tricuspid stenosis. Aortic Valve: The aortic valve was not well visualized. Aortic valve regurgitation is not visualized. No aortic stenosis is present. Aortic valve mean gradient measures 4.0 mmHg. Aortic valve peak gradient measures  7.3 mmHg. Aortic valve area, by VTI measures 2.50 cm. Pulmonic Valve: The pulmonic valve was normal in structure. Pulmonic valve regurgitation is not visualized. No evidence of pulmonic stenosis. Aorta: The aortic root is normal in size and structure. Venous: The inferior vena cava is normal in size with greater than 50% respiratory variability, suggesting right atrial pressure of 3 mmHg. IAS/Shunts: No atrial level shunt detected by color flow Doppler.  LEFT VENTRICLE PLAX 2D LVIDd:         3.69 cm   Diastology LVIDs:         2.51 cm   LV e' medial:    6.42 cm/s LV PW:         1.02 cm   LV E/e' medial:  11.8 LV IVS:        0.63 cm   LV e' lateral:   10.10 cm/s LVOT diam:     1.90 cm   LV E/e' lateral: 7.5 LV SV:         48 LV SV Index:   27        2D Longitudinal Strain LVOT Area:     2.84 cm  2D Strain GLS Avg:     -23.3 %  RIGHT VENTRICLE RV Basal diam:  2.52 cm LEFT ATRIUM             Index        RIGHT ATRIUM          Index LA diam:        3.50 cm 1.96 cm/m   RA Area:     9.34 cm LA Vol (A2C):   19.8 ml 11.08 ml/m  RA Volume:   16.50 ml 9.23 ml/m LA Vol (A4C):   19.3 ml 10.80 ml/m LA Biplane Vol: 19.7 ml 11.02 ml/m  AORTIC VALVE                    PULMONIC VALVE AV Area (Vmax):    2.12 cm     PV Vmax:       1.06 m/s AV Area (Vmean):   2.32 cm     PV Vmean:      72.100 cm/s AV Area (VTI):     2.50 cm     PV VTI:        0.176 m AV Vmax:           135.00 cm/s  PV Peak grad:  4.5 mmHg AV Vmean:          89.800 cm/s  PV Mean grad:  2.0 mmHg AV VTI:            0.194 m AV Peak Grad:      7.3 mmHg AV Mean Grad:      4.0 mmHg LVOT Vmax:         101.00 cm/s LVOT Vmean:        73.600 cm/s LVOT VTI:          0.171 m LVOT/AV VTI ratio: 0.88  AORTA Ao Root diam: 3.40 cm MITRAL VALVE MV Area (PHT): 4.54 cm  SHUNTS MV Decel Time: 167 msec    Systemic VTI:  0.17 m MV E velocity: 75.80 cm/s  Systemic Diam: 1.90 cm MV A velocity: 62.60 cm/s MV E/A ratio:  1.21 Ida Rogue MD Electronically signed by Ida Rogue MD Signature Date/Time: 10/04/2021/6:37:13 PM    Final     Assessment and plan-   #Ovarian cancer with peritoneal carcinomatosis, recurrent malignant ascites, high-grade small bowel partial obstruction. Poor prognosis.  Patient has agreed to comfort care/hospice.  Palliative care service is on board. Patient will get peritoneal catheter placement today. 2D echo showed grade 2 diastolic dysfunction. Intractable nausea vomiting due to SBO.  Continue NPO. Continue dexamethasone 12 mg daily and scheduled Reglan.  Thank you for allowing me to participate in the care of this patient.   Earlie Server, MD, PhD Hematology Oncology 10/05/2021

## 2021-10-06 LAB — BASIC METABOLIC PANEL
Anion gap: 12 (ref 5–15)
BUN: 36 mg/dL — ABNORMAL HIGH (ref 8–23)
CO2: 18 mmol/L — ABNORMAL LOW (ref 22–32)
Calcium: 8.2 mg/dL — ABNORMAL LOW (ref 8.9–10.3)
Chloride: 103 mmol/L (ref 98–111)
Creatinine, Ser: 1.4 mg/dL — ABNORMAL HIGH (ref 0.44–1.00)
GFR, Estimated: 43 mL/min — ABNORMAL LOW (ref >60)
Glucose, Bld: 89 mg/dL (ref 70–99)
Potassium: 4.3 mmol/L (ref 3.5–5.1)
Sodium: 133 mmol/L — ABNORMAL LOW (ref 135–145)

## 2021-10-06 LAB — PROCALCITONIN: Procalcitonin: 1.27 ng/mL

## 2021-10-06 MED ORDER — ONDANSETRON 8 MG PO TBDP
8.0000 mg | ORAL_TABLET | Freq: Three times a day (TID) | ORAL | 0 refills | Status: AC | PRN
Start: 2021-10-06 — End: 2021-10-13

## 2021-10-06 MED ORDER — DEXAMETHASONE 1 MG PO TABS: 1.0000 mg | ORAL_TABLET | Freq: Two times a day (BID) | ORAL | 0 refills | Status: AC

## 2021-10-06 MED ORDER — HYDROMORPHONE HCL 2 MG PO TABS: 1.0000 mg | ORAL_TABLET | Freq: Four times a day (QID) | ORAL | 0 refills | Status: DC | PRN

## 2021-10-06 NOTE — Discharge Instructions (Signed)
Pain, nausea medications and steroids are at [facility]. Hospice nurse will be at your house this evening.  Please follow up with your oncologist within a week or two.  Use your catheter to drain fluid to your comfort level.

## 2021-10-06 NOTE — Discharge Summary (Signed)
Physician Discharge Summary  Clatie Kessen LNL:[id number] DOB: 10-04-1960 DOA: 10/03/2021  PCP: Adin Hector, MD  Admit date: 10/03/2021 Discharge date: 10/06/2021  Admitted From: Home Disposition: Hospice care in home  Recommendations for Outpatient Follow-up:  Follow up with hospice nurse same day at home Follow up with heme/onc   Home hospice Equipment/Devices:hospital bed, wc, walker  Discharge Condition:stable, improved CODE STATUS:  Code Status: Prior  Regular diet as can be tolerated as advancing from clear liquid diet.  Brief/Interim Summary: Pt presented to ED with N/V which has been chronic in setting of chemotherapy and advanced ovarian cancer but had gotten acutely worse in past week. In the ED, it was found that they had high grade SBO on abdominal CT. They were treated with analgesia and antiemetics PRN. She was kept NPO and general surgery was called for evaluation. They recommended trial of non-operative treatment. With bowel rest, patient had improvement in her N/V and abdominal pain and was eventually able to trial clear liquid diet. She tolerated well.   Metastatic ovarian cancer- heme/onc followed inpatient. She had been getting regular therapeutic paracenteses for ascites and was due to get one during the time she was admitted. The fluid build up likely worsened her presenting condition. IR was consulted to place PleurX on 1/20 and removed 4L peritoneal fluid. This relieved some pain and she felt that she could advance her diet more.  Patient elected to enroll with hospice care and was able to discharge home after the catheter placement. Anti-emetics and pain medications were prescribed for discharge until hospice care established.   Discharge Diagnoses:  Principal Problem:   Nausea and vomiting Active Problems:   Essential hypertension   Glaucoma (increased eye pressure)   Obstructive sleep apnea syndrome   Ovarian cancer ([place])   Pulmonary embolus  (HCC)   Anemia   Sepsis (HCC)   Hyponatremia   Acute renal failure superimposed on stage 3a chronic kidney disease (HCC)   Small bowel obstruction (HCC)   Hypotension   Weakness   AKI (acute kidney injury) ([place])    Allergies as of 10/06/2021       Reactions   Paclitaxel Shortness Of Breath, Other (See Comments)   pt flush, stated she didn't feel well, taxol stopped (05/10/2021)   Amlodipine Other (See Comments), Rash   Not effective Not effective   Penicillins Rash        Medication List     STOP taking these medications    prochlorperazine 10 MG tablet Commonly known as: COMPAZINE       TAKE these medications    acetaminophen 500 MG tablet Commonly known as: TYLENOL Take 1,000 mg by mouth at bedtime.   albuterol 108 (90 Base) MCG/ACT inhaler Commonly known as: VENTOLIN HFA Inhale 1-2 puffs into the lungs every 6 (six) hours as needed for wheezing or shortness of breath.   clonazePAM 0.5 MG tablet Commonly known as: KLONOPIN Take 1 tablet (0.5 mg total) by mouth daily as needed for anxiety.   dexamethasone 1 MG tablet Commonly known as: DECADRON Take 1 tablet (1 mg total) by mouth 2 (two) times daily with a meal for 3 days.   Eliquis 5 MG Tabs tablet Generic drug: apixaban Take 1 tablet (5 mg total) by mouth 2 (two) times daily.   feeding supplement Liqd Take 237 mLs by mouth 2 (two) times daily between meals.   HYDROmorphone 2 MG tablet Commonly known as: Dilaudid Take 0.5 tablets (1 mg total) by  mouth every 6 (six) hours as needed for up to 5 days for severe pain.   lidocaine-prilocaine cream Commonly known as: EMLA Apply 1 application topically daily as needed (pain). Home med   metoCLOPramide 10 MG tablet Commonly known as: REGLAN Take 1 tablet (10 mg total) by mouth in the morning, at noon, and at bedtime for 14 days.   ondansetron 8 MG disintegrating tablet Commonly known as: ZOFRAN-ODT Take 1 tablet (8 mg total) by mouth every 8 (eight)  hours as needed for up to 7 days for nausea or vomiting.   oxyCODONE 5 MG immediate release tablet Commonly known as: Oxy IR/ROXICODONE Take 1 tablet (5 mg total) by mouth every 6 (six) hours as needed for severe pain.        Allergies  Allergen Reactions   Paclitaxel Shortness Of Breath and Other (See Comments)    pt flush, stated she didn't feel well, taxol stopped (05/10/2021)   Amlodipine Other (See Comments) and Rash    Not effective Not effective    Penicillins Rash    Consultations: IR Heme/onc General surgery GI  Procedures/Studies: CT ABDOMEN PELVIS WO CONTRAST  Result Date: 10/04/2021 CLINICAL DATA:  History of ovarian cancer with mid abdominal pain. EXAM: CT ABDOMEN AND PELVIS WITHOUT CONTRAST TECHNIQUE: Multidetector CT imaging of the abdomen and pelvis was performed following the standard protocol without IV contrast. RADIATION DOSE REDUCTION: This exam was performed according to the departmental dose-optimization program which includes automated exposure control, adjustment of the mA and/or kV according to patient size and/or use of iterative reconstruction technique. COMPARISON:  August 31, 2021 FINDINGS: Lower chest: Mild atelectasis and/or early infiltrate is seen within the bilateral lung bases. Hepatobiliary: No focal liver abnormality is seen. Status post cholecystectomy. No biliary dilatation. Pancreas: Unremarkable. No pancreatic ductal dilatation or surrounding inflammatory changes. Spleen: Normal in size without focal abnormality. Adrenals/Urinary Tract: Adrenal glands are unremarkable. Kidneys are normal, without renal calculi, focal lesion, or hydronephrosis. Bladder is unremarkable. Stomach/Bowel: Stomach is within normal limits. Appendix appears normal. Numerous markedly dilated proximal small bowel loops are seen within the mid and upper left abdomen (the largest measures approximately 4.7 cm in caliber). A transition zone is seen within the anterior aspect  of the mid abdomen (axial CT images 50 through 56, CT series 2). Vascular/Lymphatic: Aortic atherosclerosis. No enlarged abdominal or pelvic lymph nodes. Reproductive: A very large, approximately 15.6 cm x 11.9 cm x 15.3 cm partially calcified, complex solid and cystic mass lesion is again seen extending from the right adnexa into the mid to lower right abdomen. Other: No abdominal wall hernia or abnormality. A large amount of abdominopelvic ascites is seen (approximately 7.29 Hounsfield units). Numerous tiny soft tissue nodules are seen scattered throughout the mesentery of the anterior mid to upper abdomen (axial CT images 38 through 46, CT series 2). Musculoskeletal: No acute or significant osseous findings. IMPRESSION: 1. Findings consistent with a high-grade, proximal partial small bowel obstruction bowel obstruction. 2. Large amount of abdominopelvic ascites with numerous tiny soft tissue nodules scattered throughout the mesentery of the anterior mid to upper abdomen, consistent with peritoneal carcinomatosis. 3. Large, complex solid and cystic mass lesion extending from the right adnexa into the mid to lower right abdomen, consistent with the patient's history of ovarian cancer. 4. Mild atelectasis and/or early infiltrate within the bilateral lung bases. 5. Aortic atherosclerosis. Aortic Atherosclerosis (ICD10-I70.0). Electronically Signed   By: Virgina Norfolk M.D.   On: 10/04/2021 02:49   DG Chest 2  View  Result Date: 09/21/2021 CLINICAL DATA:  Vomiting and weakness.  History of ovarian cancer. EXAM: CHEST - 2 VIEW COMPARISON:  Chest x-ray dated June 01, 2021. FINDINGS: Unchanged right chest wall port catheter. The heart size and mediastinal contours are within normal limits. Normal pulmonary vascularity. No focal consolidation, pleural effusion, or pneumothorax. No acute osseous abnormality. IMPRESSION: 1. No acute cardiopulmonary disease. Electronically Signed   By: Titus Dubin M.D.   On:  09/21/2021 11:10   DG Abd 1 View  Result Date: 09/22/2021 CLINICAL DATA:  Ovarian cancer with malignant ascites. Obstructive sleep apnea. Nausea and vomiting. EXAM: ABDOMEN - 1 VIEW COMPARISON:  09/07/2021 FINDINGS: Cholecystectomy clips noted. The bowel gas pattern is normal. No radio-opaque calculi or other significant radiographic abnormality are seen. IMPRESSION: Normal bowel gas pattern. Electronically Signed   By: Kerby Moors M.D.   On: 09/22/2021 18:12   CT CHEST WO CONTRAST  Result Date: 10/04/2021 CLINICAL DATA:  Chest pain and shortness of breath. EXAM: CT CHEST WITHOUT CONTRAST TECHNIQUE: Multidetector CT imaging of the chest was performed following the standard protocol without IV contrast. RADIATION DOSE REDUCTION: This exam was performed according to the departmental dose-optimization program which includes automated exposure control, adjustment of the mA and/or kV according to patient size and/or use of iterative reconstruction technique. COMPARISON:  June 25, 2021 FINDINGS: Cardiovascular: There is stable right-sided venous Port-A-Cath positioning. No significant vascular findings. Normal heart size. No pericardial effusion. Mediastinum/Nodes: No enlarged mediastinal or axillary lymph nodes. Heterogeneous material is seen throughout the lumen of a mildly distended esophagus. The thyroid gland and trachea demonstrate no significant findings. Lungs/Pleura: Mild to moderate severity atelectasis and/or infiltrate is seen within the inferior aspect of the right upper lobe and posterior aspects of the bilateral lower lobes. This is new when compared to the prior study. There is no evidence of a pleural effusion or pneumothorax. Upper Abdomen: Moderate severity free fluid is seen within the abdomen. This is markedly decreased in severity within the perihepatic region and increased in severity within the anterior abdomen and anterolateral left upper quadrant. Surgical clips are noted within the  gallbladder fossa. Markedly dilated small bowel loops are suspected within the left upper quadrant. Musculoskeletal: No chest wall mass or suspicious bone lesions identified. IMPRESSION: 1. Mild to moderate severity areas of right upper lobe and bilateral lower lobe atelectasis and/or infiltrate. 2. Moderate severity free fluid within the abdomen. This is markedly decreased in severity within the perihepatic region and increased in severity within the anterior abdomen and anterolateral left upper quadrant. 3. Findings suspicious for the presence of a small bowel obstruction. Correlation with abdomen pelvis CT is recommended. Electronically Signed   By: Virgina Norfolk M.D.   On: 10/04/2021 01:03   US RENAL  Result Date: 09/21/2021 CLINICAL DATA:  Acute renal failure. EXAM: RENAL / URINARY TRACT ULTRASOUND COMPLETE COMPARISON:  None. FINDINGS: Right Kidney: Renal measurements: 9.7 cm x 4.9 cm x 4.5 cm = volume: 109.5 mL. Echogenicity within normal limits. No mass or hydronephrosis visualized. Left Kidney: Renal measurements: 9.9 cm x 4.5 cm x 4.3 cm = volume: 101.03 mL. Echogenicity within normal limits. No mass or hydronephrosis visualized. Bladder: Appears normal for degree of bladder distention. Other: None. IMPRESSION: Normal renal ultrasound. Electronically Signed   By: Virgina Norfolk M.D.   On: 09/21/2021 22:33   US Paracentesis  Result Date: 09/28/2021 INDICATION: Recurrent ascites secondary to ovarian cancer. Request for therapeutic paracentesis up to 4 L. EXAM: ULTRASOUND GUIDED  PARACENTESIS MEDICATIONS: 1% lidocaine 10 mL COMPLICATIONS: None immediate. PROCEDURE: Informed written consent was obtained from the patient after a discussion of the risks, benefits and alternatives to treatment. A timeout was performed prior to the initiation of the procedure. Initial ultrasound scanning demonstrates a large amount of ascites within the left lateral abdomen. The left lateral abdomen was prepped and  draped in the usual sterile fashion. 1% lidocaine was used for local anesthesia. Following this, a 19 gauge, 7-cm, Yueh catheter was introduced. An ultrasound image was saved for documentation purposes. The paracentesis was performed. The catheter was removed and a dressing was applied. The patient tolerated the procedure well without immediate post procedural complication. FINDINGS: A total of approximately 4 L of clear yellow fluid was removed. IMPRESSION: Successful ultrasound-guided paracentesis yielding 4 liters of peritoneal fluid. Read by: Gareth Eagle, PA-C Electronically Signed   By: Albin Felling M.D.   On: 09/28/2021 15:12   US Paracentesis  Result Date: 09/21/2021 INDICATION: Patient with a history of ovarian cancer and recurrent large volume ascites presents today for therapeutic diagnostic paracentesis. EXAM: ULTRASOUND GUIDED PARACENTESIS MEDICATIONS: 1% lidocaine 10 mL COMPLICATIONS: None immediate. PROCEDURE: Informed written consent was obtained from the patient after a discussion of the risks, benefits and alternatives to treatment. A timeout was performed prior to the initiation of the procedure. Initial ultrasound scanning demonstrates a large amount of ascites within the right lower abdominal quadrant. The right lower abdomen was prepped and draped in the usual sterile fashion. 1% lidocaine was used for local anesthesia. Following this, a 19 gauge, 7-cm, Yueh catheter was introduced. An ultrasound image was saved for documentation purposes. The paracentesis was performed. The catheter was removed and a dressing was applied. The patient tolerated the procedure well without immediate post procedural complication. FINDINGS: A total of approximately 5 L of amber-colored fluid was removed. Samples were sent to the laboratory as requested by the clinical team. IMPRESSION: Successful ultrasound-guided paracentesis yielding 5 liters of peritoneal fluid. Read by: Soyla Dryer, NP Electronically  Signed   By: Aletta Edouard M.D.   On: 09/21/2021 17:15   US Paracentesis  Result Date: 09/13/2021 INDICATION: ascites EXAM: ULTRASOUND GUIDED  PARACENTESIS MEDICATIONS: None. COMPLICATIONS: None immediate. PROCEDURE: Informed written consent was obtained from the patient after a discussion of the risks, benefits and alternatives to treatment. A timeout was performed prior to the initiation of the procedure. Initial ultrasound scanning demonstrates a large amount of ascites within the right lower abdominal quadrant. The right lower abdomen was prepped and draped in the usual sterile fashion. 1% lidocaine was used for local anesthesia. Following this, a 19 gauge, 10-cm, Yueh catheter was introduced. An ultrasound image was saved for documentation purposes. The paracentesis was performed. The catheter was removed and a dressing was applied. The patient tolerated the procedure well without immediate post procedural complication. FINDINGS: A total of approximately 4.0 of dark amber fluid was removed. IMPRESSION: Successful ultrasound-guided paracentesis yielding approximately 4.0 liters of dark amber peritoneal fluid. Electronically Signed   By: Albin Felling M.D.   On: 09/13/2021 08:31   IR Perc Tun Perit Cath W/Port  Result Date: 10/05/2021 INDICATION: Malignant ascites EXAM: 1. Ultrasound-guided paracentesis 2. Placement of a tunneled peritoneal drainage catheter (abdominal PleurX) using fluoroscopic guidance MEDICATIONS: None ANESTHESIA/SEDATION: Moderate (conscious) sedation was employed during this procedure. A total of Versed 1 mg and Fentanyl 25 mcg was administered intravenously by the radiology nurse. Total intra-service moderate Sedation Time: 25 minutes. The patient's level of consciousness and  vital signs were monitored continuously by radiology nursing throughout the procedure under my direct supervision. FLUOROSCOPIC TIME: FLUOROSCOPIC TIME 0 minutes 18 seconds (4.5 mGy) COMPLICATIONS: None  immediate. PROCEDURE: Informed written consent was obtained from the patient after a thorough discussion of the procedural risks, benefits and alternatives. All questions were addressed. Maximal Sterile Barrier Technique was utilized including caps, mask, sterile gowns, sterile gloves, sterile drape, hand hygiene and skin antiseptic. A timeout was performed prior to the initiation of the procedure. The patient was placed supine on the exam table. Limited ultrasound of the left lower quadrant was performed, demonstrating a thickened appearance of the peritoneal surface, with a deeper fluid pocket. This was identified as an appropriate location for peritoneal drainage catheter placement. Skin entry site was marked, the overlying skin was prepped and draped in the standard sterile fashion. Local analgesia was obtained with 1% lidocaine. Under ultrasound guidance, a 19 gauge Yueh catheter was advanced into the free fluid pocket identified. There was free return of straw-colored ascites. An 035 wire was advanced in the peritoneal cavity and confirmed with fluoroscopy. A 12 French dilator was advanced over the wire after dermatotomy was made. A second dermatotomy was then made in a more medial location after the administration of additional local anesthetic. From this location, a cuffed peritoneal drainage catheter was tunneled to the initial access site. Over the 035 wire, a valve peel-away sheath was advanced through the peritoneal cavity. The peritoneal drainage catheter was then advanced through this peel-away sheath into the peritoneal cavity. Location was confirmed with fluoroscopy. The catheter was tested and found to function appropriately. The initial dermatotomy was closed with 4-0 Vicryl and Dermabond. The drainage catheter was secured to the skin at the access site using 0 silk suture. A clean dressing was placed. Paracentesis was then performed with removal of 2.9 L of straw-colored ascites. The patient  tolerated all procedures well without immediate complication. IMPRESSION: 1. Successful placement of a cuffed peritoneal drainage catheter using ultrasound and fluoroscopic guidance. 2. Successful removal of 2.9 L of straw-colored ascites using the new drainage catheter. The drainage catheter is ready for immediate use. Electronically Signed   By: Albin Felling M.D.   On: 10/05/2021 16:40   ECHOCARDIOGRAM COMPLETE  Result Date: 10/04/2021    ECHOCARDIOGRAM REPORT   Patient Name:   SILVERIA BOTZ Date of Exam: 10/04/2021 Medical Rec #:  [id number]              Height:       61.0 in Accession #:    [id number]             Weight:       175.7 lb Date of Birth:  05-01-1961               BSA:          1.788 m Patient Age:    8 years               BP:           116/75 mmHg Patient Gender: F                      HR:           87 bpm. Exam Location:  [facility] Procedure: 2D Echo, Color Doppler, Cardiac Doppler and Strain Analysis Indications:     R06.00 Dyspnea  History:         Patient has no prior history of Echocardiogram  examinations.                  Risk Factors:Sleep Apnea. Hx of ovarian cancer and cirrhosis of                  liver.  Sonographer:     Charmayne Sheer Referring Phys:  [place] Diagnosing Phys: Ida Rogue MD  Sonographer Comments: No subcostal window and suboptimal parasternal window. Global longitudinal strain was attempted. IMPRESSIONS  1. Left ventricular ejection fraction, by estimation, is 60 to 65%. The left ventricle has normal function. The left ventricle has no regional wall motion abnormalities. Left ventricular diastolic parameters are consistent with Grade II diastolic dysfunction (pseudonormalization). The average left ventricular global longitudinal strain is -23.3 %. The global longitudinal strain is normal.  2. Right ventricular systolic function is normal. The right ventricular size is normal. Tricuspid regurgitation signal is inadequate for assessing PA pressure.  3.  The mitral valve is normal in structure. No evidence of mitral valve regurgitation. No evidence of mitral stenosis.  4. The aortic valve was not well visualized. Aortic valve regurgitation is not visualized. No aortic stenosis is present.  5. The inferior vena cava is normal in size with greater than 50% respiratory variability, suggesting right atrial pressure of 3 mmHg. FINDINGS  Left Ventricle: Left ventricular ejection fraction, by estimation, is 60 to 65%. The left ventricle has normal function. The left ventricle has no regional wall motion abnormalities. The average left ventricular global longitudinal strain is -23.3 %. The global longitudinal strain is normal. The left ventricular internal cavity size was normal in size. There is no left ventricular hypertrophy. Left ventricular diastolic parameters are consistent with Grade II diastolic dysfunction (pseudonormalization). Right Ventricle: The right ventricular size is normal. No increase in right ventricular wall thickness. Right ventricular systolic function is normal. Tricuspid regurgitation signal is inadequate for assessing PA pressure. Left Atrium: Left atrial size was normal in size. Right Atrium: Right atrial size was normal in size. Pericardium: There is no evidence of pericardial effusion. Mitral Valve: The mitral valve is normal in structure. No evidence of mitral valve regurgitation. No evidence of mitral valve stenosis. Tricuspid Valve: The tricuspid valve is normal in structure. Tricuspid valve regurgitation is not demonstrated. No evidence of tricuspid stenosis. Aortic Valve: The aortic valve was not well visualized. Aortic valve regurgitation is not visualized. No aortic stenosis is present. Aortic valve mean gradient measures 4.0 mmHg. Aortic valve peak gradient measures 7.3 mmHg. Aortic valve area, by VTI measures 2.50 cm. Pulmonic Valve: The pulmonic valve was normal in structure. Pulmonic valve regurgitation is not visualized. No  evidence of pulmonic stenosis. Aorta: The aortic root is normal in size and structure. Venous: The inferior vena cava is normal in size with greater than 50% respiratory variability, suggesting right atrial pressure of 3 mmHg. IAS/Shunts: No atrial level shunt detected by color flow Doppler.  LEFT VENTRICLE PLAX 2D LVIDd:         3.69 cm   Diastology LVIDs:         2.51 cm   LV e' medial:    6.42 cm/s LV PW:         1.02 cm   LV E/e' medial:  11.8 LV IVS:        0.63 cm   LV e' lateral:   10.10 cm/s LVOT diam:     1.90 cm   LV E/e' lateral: 7.5 LV SV:  48 LV SV Index:   27        2D Longitudinal Strain LVOT Area:     2.84 cm  2D Strain GLS Avg:     -23.3 %  RIGHT VENTRICLE RV Basal diam:  2.52 cm LEFT ATRIUM             Index        RIGHT ATRIUM          Index LA diam:        3.50 cm 1.96 cm/m   RA Area:     9.34 cm LA Vol (A2C):   19.8 ml 11.08 ml/m  RA Volume:   16.50 ml 9.23 ml/m LA Vol (A4C):   19.3 ml 10.80 ml/m LA Biplane Vol: 19.7 ml 11.02 ml/m  AORTIC VALVE                    PULMONIC VALVE AV Area (Vmax):    2.12 cm     PV Vmax:       1.06 m/s AV Area (Vmean):   2.32 cm     PV Vmean:      72.100 cm/s AV Area (VTI):     2.50 cm     PV VTI:        0.176 m AV Vmax:           135.00 cm/s  PV Peak grad:  4.5 mmHg AV Vmean:          89.800 cm/s  PV Mean grad:  2.0 mmHg AV VTI:            0.194 m AV Peak Grad:      7.3 mmHg AV Mean Grad:      4.0 mmHg LVOT Vmax:         101.00 cm/s LVOT Vmean:        73.600 cm/s LVOT VTI:          0.171 m LVOT/AV VTI ratio: 0.88  AORTA Ao Root diam: 3.40 cm MITRAL VALVE MV Area (PHT): 4.54 cm    SHUNTS MV Decel Time: 167 msec    Systemic VTI:  0.17 m MV E velocity: 75.80 cm/s  Systemic Diam: 1.90 cm MV A velocity: 62.60 cm/s MV E/A ratio:  1.21 Ida Rogue MD Electronically signed by Ida Rogue MD Signature Date/Time: 10/04/2021/6:37:13 PM    Final     Subjective: Patient feels great improvement in her abdominal pain, nausea. Has not had another episode  of emesis in a couple days. She had a regular BM. Able to tolerate liquid diet without issues for breakfast and lunch. She wants to go home today.   Discharge Exam: Vitals:   10/06/21 0745 10/06/21 1147  BP: 91/65 (!) 89/63  Pulse: 74 66  Resp: 17 19  Temp: 98.1 F (36.7 C) 97.7 F (36.5 C)  SpO2: 100% 99%    General: Pt is alert, awake, not in acute distress Cardiovascular: RRR, S1/S2 +, no rubs, no gallops Respiratory: CTA bilaterally, no wheezing, no rhonchi Abdominal: Soft, NT, ND, bowel sounds +. Tunneled cath in place Extremities: +LE edema, no cyanosis  Labs: Basic Metabolic Panel: Recent Labs  Lab 10/03/21 1435 10/03/21 1604 10/04/21 0300 10/04/21 0820 10/06/21 0531  NA 124* 123* 124*  --  133*  K 4.5 4.8 4.8  --  4.3  CL 85* 85* 88*  --  103  CO2 25 25 25   --  18*  GLUCOSE 166* 159* 102*  --  89  BUN 38* 40* 41*  --  36*  CREATININE 2.04* 2.15* 1.96*  --  1.40*  CALCIUM 9.5 9.9 8.7*  --  8.2*  MG  --  1.5*  --  2.5*  --    CBC: Recent Labs  Lab 10/03/21 1435 10/03/21 1604 10/04/21 0300  WBC 25.5* 28.0* 24.5*  NEUTROABS 24.0*  --  23.0*  HGB 10.9* 10.9* 9.3*  HCT 32.7* 33.2* 27.3*  MCV 94.5 95.4 93.2  PLT 411* 419* 321    Microbiology Recent Results (from the past 240 hour(s))  Resp Panel by RT-PCR (Flu A&B, Covid) Nasopharyngeal Swab     Status: None   Collection Time: 10/03/21  5:20 PM   Specimen: Nasopharyngeal Swab; Nasopharyngeal(NP) swabs in vial transport medium  Result Value Ref Range Status   SARS Coronavirus 2 by RT PCR NEGATIVE NEGATIVE Final    Comment: (NOTE) SARS-CoV-2 target nucleic acids are NOT DETECTED.  The SARS-CoV-2 RNA is generally detectable in upper respiratory specimens during the acute phase of infection. The lowest concentration of SARS-CoV-2 viral copies this assay can detect is 138 copies/mL. A negative result does not preclude SARS-Cov-2 infection and should not be used as the sole basis for treatment or other  patient management decisions. A negative result may occur with  improper specimen collection/handling, submission of specimen other than nasopharyngeal swab, presence of viral mutation(s) within the areas targeted by this assay, and inadequate number of viral copies(<138 copies/mL). A negative result must be combined with clinical observations, patient history, and epidemiological information. The expected result is Negative.  Fact Sheet for Patients:  EntrepreneurPulse.com.au  Fact Sheet for Healthcare Providers:  IncredibleEmployment.be  This test is no t yet approved or cleared by the [place] FDA and  has been authorized for detection and/or diagnosis of SARS-CoV-2 by FDA under an Emergency Use Authorization (EUA). This EUA will remain  in effect (meaning this test can be used) for the duration of the COVID-19 declaration under Section 564(b)(1) of the Act, 21 U.S.C.section 360bbb-3(b)(1), unless the authorization is terminated  or revoked sooner.       Influenza A by PCR NEGATIVE NEGATIVE Final   Influenza B by PCR NEGATIVE NEGATIVE Final    Comment: (NOTE) The Xpert Xpress SARS-CoV-2/FLU/RSV plus assay is intended as an aid in the diagnosis of influenza from Nasopharyngeal swab specimens and should not be used as a sole basis for treatment. Nasal washings and aspirates are unacceptable for Xpert Xpress SARS-CoV-2/FLU/RSV testing.  Fact Sheet for Patients: EntrepreneurPulse.com.au  Fact Sheet for Healthcare Providers: IncredibleEmployment.be  This test is not yet approved or cleared by the [place] FDA and has been authorized for detection and/or diagnosis of SARS-CoV-2 by FDA under an Emergency Use Authorization (EUA). This EUA will remain in effect (meaning this test can be used) for the duration of the COVID-19 declaration under Section 564(b)(1) of the Act, 21 U.S.C. section  360bbb-3(b)(1), unless the authorization is terminated or revoked.  Performed at [facility], [address] [place], [place] [address]   MRSA Next Gen by PCR, Nasal     Status: None   Collection Time: 10/04/21  3:00 AM   Specimen: Nasal Mucosa; Nasal Swab  Result Value Ref Range Status   MRSA by PCR Next Gen NOT DETECTED NOT DETECTED Final    Comment: (NOTE) The GeneXpert MRSA Assay (FDA approved for NASAL specimens only), is one component of a comprehensive MRSA colonization surveillance program. It is not intended to diagnose  MRSA infection nor to guide or monitor treatment for MRSA infections. Test performance is not FDA approved in patients less than 78 years old. Performed at [facility], [address] [place], [place] [address]     Time coordinating discharge: Over 30 minutes  Richarda Osmond, MD  [facility] 10/07/2021, 7:59 PM

## 2021-10-06 NOTE — Progress Notes (Addendum)
[facility])  All necessary DME (hospital bed, WC, walker), has been delivered to the home.  Per pt, plans are to dc home today, per MD waiting to see how she tolerates breakfast and lunch.   She will need the following arranged by the hospital prior to discharging:  - pleurex supplies - she needs at least a few days supplies (depending on the frequency of draining)  - order parameters for draining  - suspension oral meds for pain, anxiety, and nausea (2-3 days supply)  Once she is home and admitted to hospice services, we will provide above items.  Patient can d/c home via private vehicle.  Please send completed DNR.  Updated hospital team via epic chat.   Thank you, Venia Carbon RN, BSN [facility] Liaison

## 2021-10-06 NOTE — Progress Notes (Addendum)
Discharge instructions explained to pt and spouse. Verbalizes understanding. Medication instructions given. Informed pt hospice will switch meds over to liquid form. Pt provided 1 full box of pleurX supplies for drain management at home. Chest port deaccessed prior to discharge. Gauze and tegaderm dsg applied.   DNR signed and sent home with patient.

## 2021-10-06 NOTE — Progress Notes (Incomplete)
PROGRESS NOTE  Brittany Montoya    DOB: March 16, 1961, 61 y.o.  AOZ:[id number]  PCP: Adin Hector, MD   Code Status: DNR   DOA: 10/03/2021   LOS: 2  Brief Narrative of Current Hospitalization  Brittany Montoya is a 61 y.o. female with a PMH significant for HTN, glaucoma, OSA, metastatic ovarian cancer, h/o PE, anemia, CKDIII. They presented from home to the ED on 10/03/2021 with N/V which has been chronic in setting of chemotherapy and advanced ovarian cancer but has gotten acutely worse in past week. In the ED, it was found that they had high grade SBO on abdominal CT. They were treated with analgesia and antiemetics PRN. She was kept NPO and general surgery was called for evaluation.  Patient was admitted to medicine service for further workup and management of SBO as outlined in detail below.  10/06/21 -stable  Assessment & Plan  Principal Problem:   Nausea and vomiting Active Problems:   Essential hypertension   Glaucoma (increased eye pressure)   Obstructive sleep apnea syndrome   Ovarian cancer ([place])   Pulmonary embolus (HCC)   Anemia   Sepsis (HCC)   Hyponatremia   Acute renal failure superimposed on stage 3a chronic kidney disease (HCC)   Small bowel obstruction (HCC)   Hypotension   Weakness   AKI (acute kidney injury) ([place])  High grade SBO as seen on abdominal CT   intractable N/V- patient states that she is starting to feel like she could tolerate eating some vanilla pudding. After her procedure today, could potentially start on a clear liquid diet to see how she does.  - antiemetics PRN - NPO - surgery consulted  Ovarian cancer/ascites- See Dr. Tasia Catchings clinic note from 1/18. Patient has decided to pursue hospice care.  - IR consulted for tunneled peritoneal catheter for therapeutic paracentesis at home which is planned for today.  - Dr. Tasia Catchings following, appreciate recs  Hypotension with h/o HTN- stable. responsive to IV fluid boluses. Patient is asymptomatic.   - Consider midodrine if continues to be low to avoid fluid overload.  - monitor - hold home antihypertensives  H/o PE- on eliquis chronically but being held while NPO. Respiratory status stable. - currently on lovenox- can restart on eliquis once tolerating PO  H/o anxiety- holding home PO clonazepam for NPO status. Added on IV ativan PRN  Hyponatremia- chronic, stable. Na [id number]. Patient is tired but has multiple reasons for this so unsure of hyponatremia contribution. Will expect to improve some once able to tolerate PO again. - continue IV fluids.  - consider salt tablets.  - BMP q8hr   AKI on CKD- difficult to assess baseline Cr. Cr 2.04>2.15>1.96 - continue to monitor   OSA- Cont with cpap.   Glaucoma- Cont with home eye drops  DVT prophylaxis: SCDs Start: 10/03/21 1855   Diet:  Diet Orders (From admission, onward)     Start     Ordered   10/05/21 0001  Diet NPO time specified Except for: Sips with Meds  Diet effective midnight       Question:  Except for  Answer:  Sips with Meds   10/04/21 1509            Subjective 10/06/21    Pt reports feeling overall okay today. Denies significant concerns but is happy to hear that she will be getting a peritoneal cath today in place of regular paracentesis. She thinks that she can start trying food later today.  Disposition Plan & Communication  Patient status: Inpatient  Admitted From: Home Disposition: home hospice Anticipated discharge date: TBD  Family Communication: daughter at bedside  Consults, Procedures, Significant Events  Consultants:  General surgery Palliative Hospice IR  Procedures/significant events:  1/20- tunneled peritoneal catheter placed  Antimicrobials:  Anti-infectives (From admission, onward)    Start     Dose/Rate Route Frequency Ordered Stop   10/06/21 0300  vancomycin (VANCOREADY) IVPB 750 mg/150 mL  Status:  Discontinued        750 mg 150 mL/hr over 60 Minutes Intravenous  Every 48 hours 10/04/21 0148 10/04/21 0751   10/05/21 1415  vancomycin (VANCOCIN) IVPB 1000 mg/200 mL premix        1,000 mg 200 mL/hr over 60 Minutes Intravenous  Once 10/05/21 1320 10/05/21 1510   10/04/21 0245  meropenem (MERREM) 1 g in sodium chloride 0.9 % 100 mL IVPB  Status:  Discontinued        1 g 200 mL/hr over 30 Minutes Intravenous Every 12 hours 10/04/21 0159 10/04/21 0751       Objective   Vitals:   10/05/21 1603 10/05/21 2032 10/06/21 0005 10/06/21 0537  BP: (!) 84/56 91/65 (!) 88/60 94/65  Pulse: 65 64 66 72  Resp: 16 16 16 16   Temp: 97.8 F (36.6 C) 99 F (37.2 C) 98 F (36.7 C) 97.8 F (36.6 C)  TempSrc: Oral Oral Oral Oral  SpO2: 90% 94% 98% 100%  Weight:      Height:        Intake/Output Summary (Last 24 hours) at 10/06/2021 0605 Last data filed at 10/05/2021 1500 Gross per 24 hour  Intake 3317 ml  Output --  Net 3317 ml    Filed Weights   10/03/21 2317  Weight: 79.7 kg    Patient BMI: Body mass index is 33.2 kg/m.   Physical Exam:  General: awake, alert, NAD HEENT: atraumatic, clear conjunctiva, anicteric sclera, MMM, hearing grossly normal Respiratory: normal respiratory effort. Cardiovascular: normal S1/S2, RRR, no JVD, murmurs, quick capillary refill  Gastrointestinal: distended, soft. Endorses tenderness diffusely but no rebound or guarding Nervous: A&O x3. no gross focal neurologic deficits, normal speech Extremities: moves all equally, no edema, normal tone Skin: dry, intact, normal temperature, normal color. No rashes, lesions or ulcers on exposed skin Psychiatry: normal mood, congruent affect  Labs   I have personally reviewed following labs and imaging studies Admission on 10/03/2021  Component Date Value Ref Range Status   Sodium 10/03/2021 123 (L)  135 - 145 mmol/L Final   Potassium 10/03/2021 4.8  3.5 - 5.1 mmol/L Final   Chloride 10/03/2021 85 (L)  98 - 111 mmol/L Final   CO2 10/03/2021 25  22 - 32 mmol/L Final   Glucose,  Bld 10/03/2021 159 (H)  70 - 99 mg/dL Final   BUN 10/03/2021 40 (H)  8 - 23 mg/dL Final   Creatinine, Ser 10/03/2021 2.15 (H)  0.44 - 1.00 mg/dL Final   Calcium 10/03/2021 9.9  8.9 - 10.3 mg/dL Final   GFR, Estimated 10/03/2021 26 (L)  >60 mL/min Final   Anion gap 10/03/2021 13  5 - 15 Final   WBC 10/03/2021 28.0 (H)  4.0 - 10.5 K/uL Final   RBC 10/03/2021 3.48 (L)  3.87 - 5.11 MIL/uL Final   Hemoglobin 10/03/2021 10.9 (L)  12.0 - 15.0 g/dL Final   HCT 10/03/2021 33.2 (L)  36.0 - 46.0 % Final   MCV 10/03/2021 95.4  80.0 - 100.0  fL Final   MCH 10/03/2021 31.3  26.0 - 34.0 pg Final   MCHC 10/03/2021 32.8  30.0 - 36.0 g/dL Final   RDW 10/03/2021 17.6 (H)  11.5 - 15.5 % Final   Platelets 10/03/2021 419 (H)  150 - 400 K/uL Final   nRBC 10/03/2021 0.0  0.0 - 0.2 % Final   Color, Urine 10/04/2021 AMBER (A)  YELLOW Final   APPearance 10/04/2021 HAZY (A)  CLEAR Final   Specific Gravity, Urine 10/04/2021 1.020  1.005 - 1.030 Final   pH 10/04/2021 5.0  5.0 - 8.0 Final   Glucose, UA 10/04/2021 NEGATIVE  NEGATIVE mg/dL Final   Hgb urine dipstick 10/04/2021 SMALL (A)  NEGATIVE Final   Bilirubin Urine 10/04/2021 NEGATIVE  NEGATIVE Final   Ketones, ur 10/04/2021 NEGATIVE  NEGATIVE mg/dL Final   Protein, ur 10/04/2021 NEGATIVE  NEGATIVE mg/dL Final   Nitrite 10/04/2021 NEGATIVE  NEGATIVE Final   Leukocytes,Ua 10/04/2021 TRACE (A)  NEGATIVE Final   RBC / HPF 10/04/2021 0-5  0 - 5 RBC/hpf Final   WBC, UA 10/04/2021 21-50  0 - 5 WBC/hpf Final   Bacteria, UA 10/04/2021 NONE SEEN  NONE SEEN Final   Squamous Epithelial / LPF 10/04/2021 0-5  0 - 5 Final   Mucus 10/04/2021 PRESENT   Final   Hyaline Casts, UA 10/04/2021 PRESENT   Final   SARS Coronavirus 2 by RT PCR 10/03/2021 NEGATIVE  NEGATIVE Final   Influenza A by PCR 10/03/2021 NEGATIVE  NEGATIVE Final   Influenza B by PCR 10/03/2021 NEGATIVE  NEGATIVE Final   Prothrombin Time 10/03/2021 18.6 (H)  11.4 - 15.2 seconds Final   INR 10/03/2021 1.6 (H)   0.8 - 1.2 Final   Weight 10/04/2021 2,811.31  oz Final   Height 10/04/2021 61  in Final   BP 10/04/2021 116/75  mmHg Final   Ao pk vel 10/04/2021 1.35  m/s Final   AV Area VTI 10/04/2021 2.50  cm2 Final   AR max vel 10/04/2021 2.12  cm2 Final   AV Mean grad 10/04/2021 4.0  mmHg Final   AV Peak grad 10/04/2021 7.3  mmHg Final   S' Lateral 10/04/2021 2.51  cm Final   AV Area mean vel 10/04/2021 2.32  cm2 Final   Area-P 1/2 10/04/2021 4.54  cm2 Final   Lactic Acid, Venous 10/03/2021 1.7  0.5 - 1.9 mmol/L Final   Magnesium 10/03/2021 1.5 (L)  1.7 - 2.4 mg/dL Final   Troponin I (High Sensitivity) 10/03/2021 22 (H)  <18 ng/L Final   Troponin I (High Sensitivity) 10/03/2021 25 (H)  <18 ng/L Final   Glucose-Capillary 10/03/2021 105 (H)  70 - 99 mg/dL Final   aPTT 10/03/2021 41 (H)  24 - 36 seconds Final   Lactic Acid, Venous 10/04/2021 2.1 (HH)  0.5 - 1.9 mmol/L Final   Sodium 10/04/2021 124 (L)  135 - 145 mmol/L Final   Potassium 10/04/2021 4.8  3.5 - 5.1 mmol/L Final   Chloride 10/04/2021 88 (L)  98 - 111 mmol/L Final   CO2 10/04/2021 25  22 - 32 mmol/L Final   Glucose, Bld 10/04/2021 102 (H)  70 - 99 mg/dL Final   BUN 10/04/2021 41 (H)  8 - 23 mg/dL Final   Creatinine, Ser 10/04/2021 1.96 (H)  0.44 - 1.00 mg/dL Final   Calcium 10/04/2021 8.7 (L)  8.9 - 10.3 mg/dL Final   GFR, Estimated 10/04/2021 29 (L)  >60 mL/min Final   Anion gap 10/04/2021 11  5 - 15 Final   Lactic Acid, Venous 10/04/2021 1.1  0.5 - 1.9 mmol/L Final   WBC 10/04/2021 24.5 (H)  4.0 - 10.5 K/uL Final   RBC 10/04/2021 2.93 (L)  3.87 - 5.11 MIL/uL Final   Hemoglobin 10/04/2021 9.3 (L)  12.0 - 15.0 g/dL Final   HCT 10/04/2021 27.3 (L)  36.0 - 46.0 % Final   MCV 10/04/2021 93.2  80.0 - 100.0 fL Final   MCH 10/04/2021 31.7  26.0 - 34.0 pg Final   MCHC 10/04/2021 34.1  30.0 - 36.0 g/dL Final   RDW 10/04/2021 17.3 (H)  11.5 - 15.5 % Final   Platelets 10/04/2021 321  150 - 400 K/uL Final   nRBC 10/04/2021 0.0  0.0 - 0.2 %  Final   Neutrophils Relative % 10/04/2021 94  % Final   Neutro Abs 10/04/2021 23.0 (H)  1.7 - 7.7 K/uL Final   Lymphocytes Relative 10/04/2021 2  % Final   Lymphs Abs 10/04/2021 0.6 (L)  0.7 - 4.0 K/uL Final   Monocytes Relative 10/04/2021 3  % Final   Monocytes Absolute 10/04/2021 0.8  0.1 - 1.0 K/uL Final   Eosinophils Relative 10/04/2021 0  % Final   Eosinophils Absolute 10/04/2021 0.0  0.0 - 0.5 K/uL Final   Basophils Relative 10/04/2021 0  % Final   Basophils Absolute 10/04/2021 0.0  0.0 - 0.1 K/uL Final   Immature Granulocytes 10/04/2021 1  % Final   Abs Immature Granulocytes 10/04/2021 0.13 (H)  0.00 - 0.07 K/uL Final   MRSA by PCR Next Gen 10/04/2021 NOT DETECTED  NOT DETECTED Final   Procalcitonin 10/04/2021 2.14  ng/mL Final   Lactic Acid, Venous 10/04/2021 1.2  0.5 - 1.9 mmol/L Final   Magnesium 10/04/2021 2.5 (H)  1.7 - 2.4 mg/dL Final   Total Protein 10/04/2021 5.0 (L)  6.5 - 8.1 g/dL Final   Albumin 10/04/2021 <1.5 (L)  3.5 - 5.0 g/dL Final   AST 10/04/2021 31  15 - 41 U/L Final   ALT 10/04/2021 16  0 - 44 U/L Final   Alkaline Phosphatase 10/04/2021 135 (H)  38 - 126 U/L Final   Total Bilirubin 10/04/2021 0.9  0.3 - 1.2 mg/dL Final   Bilirubin, Direct 10/04/2021 0.3 (H)  0.0 - 0.2 mg/dL Final   Indirect Bilirubin 10/04/2021 0.6  0.3 - 0.9 mg/dL Final   Procalcitonin 10/05/2021 2.00  ng/mL Final    Imaging Studies  IR Perc Tun Perit Cath W/Port  Result Date: 10/05/2021 INDICATION: Malignant ascites EXAM: 1. Ultrasound-guided paracentesis 2. Placement of a tunneled peritoneal drainage catheter (abdominal PleurX) using fluoroscopic guidance MEDICATIONS: None ANESTHESIA/SEDATION: Moderate (conscious) sedation was employed during this procedure. A total of Versed 1 mg and Fentanyl 25 mcg was administered intravenously by the radiology nurse. Total intra-service moderate Sedation Time: 25 minutes. The patient's level of consciousness and vital signs were monitored continuously  by radiology nursing throughout the procedure under my direct supervision. FLUOROSCOPIC TIME: FLUOROSCOPIC TIME 0 minutes 18 seconds (4.5 mGy) COMPLICATIONS: None immediate. PROCEDURE: Informed written consent was obtained from the patient after a thorough discussion of the procedural risks, benefits and alternatives. All questions were addressed. Maximal Sterile Barrier Technique was utilized including caps, mask, sterile gowns, sterile gloves, sterile drape, hand hygiene and skin antiseptic. A timeout was performed prior to the initiation of the procedure. The patient was placed supine on the exam table. Limited ultrasound of the left lower quadrant was performed, demonstrating a thickened appearance of  the peritoneal surface, with a deeper fluid pocket. This was identified as an appropriate location for peritoneal drainage catheter placement. Skin entry site was marked, the overlying skin was prepped and draped in the standard sterile fashion. Local analgesia was obtained with 1% lidocaine. Under ultrasound guidance, a 19 gauge Yueh catheter was advanced into the free fluid pocket identified. There was free return of straw-colored ascites. An 035 wire was advanced in the peritoneal cavity and confirmed with fluoroscopy. A 12 French dilator was advanced over the wire after dermatotomy was made. A second dermatotomy was then made in a more medial location after the administration of additional local anesthetic. From this location, a cuffed peritoneal drainage catheter was tunneled to the initial access site. Over the 035 wire, a valve peel-away sheath was advanced through the peritoneal cavity. The peritoneal drainage catheter was then advanced through this peel-away sheath into the peritoneal cavity. Location was confirmed with fluoroscopy. The catheter was tested and found to function appropriately. The initial dermatotomy was closed with 4-0 Vicryl and Dermabond. The drainage catheter was secured to the skin at  the access site using 0 silk suture. A clean dressing was placed. Paracentesis was then performed with removal of 2.9 L of straw-colored ascites. The patient tolerated all procedures well without immediate complication. IMPRESSION: 1. Successful placement of a cuffed peritoneal drainage catheter using ultrasound and fluoroscopic guidance. 2. Successful removal of 2.9 L of straw-colored ascites using the new drainage catheter. The drainage catheter is ready for immediate use. Electronically Signed   By: Albin Felling M.D.   On: 10/05/2021 16:40   ECHOCARDIOGRAM COMPLETE  Result Date: 10/04/2021    ECHOCARDIOGRAM REPORT   Patient Name:   KAORU BENDA Date of Exam: 10/04/2021 Medical Rec #:  [id number]              Height:       61.0 in Accession #:    [id number]             Weight:       175.7 lb Date of Birth:  Jun 09, 1961               BSA:          1.788 m Patient Age:    89 years               BP:           116/75 mmHg Patient Gender: F                      HR:           87 bpm. Exam Location:  [facility] Procedure: 2D Echo, Color Doppler, Cardiac Doppler and Strain Analysis Indications:     R06.00 Dyspnea  History:         Patient has no prior history of Echocardiogram examinations.                  Risk Factors:Sleep Apnea. Hx of ovarian cancer and cirrhosis of                  liver.  Sonographer:     Charmayne Sheer Referring Phys:  [place]uther Diagnosing Phys: Ida Rogue MD  Sonographer Comments: No subcostal window and suboptimal parasternal window. Global longitudinal strain was attempted. IMPRESSIONS  1. Left ventricular ejection fraction, by estimation, is 60 to 65%. The left ventricle has normal function. The left ventricle has no regional wall  motion abnormalities. Left ventricular diastolic parameters are consistent with Grade II diastolic dysfunction (pseudonormalization). The average left ventricular global longitudinal strain is -23.3 %. The global longitudinal strain is normal.  2.  Right ventricular systolic function is normal. The right ventricular size is normal. Tricuspid regurgitation signal is inadequate for assessing PA pressure.  3. The mitral valve is normal in structure. No evidence of mitral valve regurgitation. No evidence of mitral stenosis.  4. The aortic valve was not well visualized. Aortic valve regurgitation is not visualized. No aortic stenosis is present.  5. The inferior vena cava is normal in size with greater than 50% respiratory variability, suggesting right atrial pressure of 3 mmHg. FINDINGS  Left Ventricle: Left ventricular ejection fraction, by estimation, is 60 to 65%. The left ventricle has normal function. The left ventricle has no regional wall motion abnormalities. The average left ventricular global longitudinal strain is -23.3 %. The global longitudinal strain is normal. The left ventricular internal cavity size was normal in size. There is no left ventricular hypertrophy. Left ventricular diastolic parameters are consistent with Grade II diastolic dysfunction (pseudonormalization). Right Ventricle: The right ventricular size is normal. No increase in right ventricular wall thickness. Right ventricular systolic function is normal. Tricuspid regurgitation signal is inadequate for assessing PA pressure. Left Atrium: Left atrial size was normal in size. Right Atrium: Right atrial size was normal in size. Pericardium: There is no evidence of pericardial effusion. Mitral Valve: The mitral valve is normal in structure. No evidence of mitral valve regurgitation. No evidence of mitral valve stenosis. Tricuspid Valve: The tricuspid valve is normal in structure. Tricuspid valve regurgitation is not demonstrated. No evidence of tricuspid stenosis. Aortic Valve: The aortic valve was not well visualized. Aortic valve regurgitation is not visualized. No aortic stenosis is present. Aortic valve mean gradient measures 4.0 mmHg. Aortic valve peak gradient measures 7.3 mmHg.  Aortic valve area, by VTI measures 2.50 cm. Pulmonic Valve: The pulmonic valve was normal in structure. Pulmonic valve regurgitation is not visualized. No evidence of pulmonic stenosis. Aorta: The aortic root is normal in size and structure. Venous: The inferior vena cava is normal in size with greater than 50% respiratory variability, suggesting right atrial pressure of 3 mmHg. IAS/Shunts: No atrial level shunt detected by color flow Doppler.  LEFT VENTRICLE PLAX 2D LVIDd:         3.69 cm   Diastology LVIDs:         2.51 cm   LV e' medial:    6.42 cm/s LV PW:         1.02 cm   LV E/e' medial:  11.8 LV IVS:        0.63 cm   LV e' lateral:   10.10 cm/s LVOT diam:     1.90 cm   LV E/e' lateral: 7.5 LV SV:         48 LV SV Index:   27        2D Longitudinal Strain LVOT Area:     2.84 cm  2D Strain GLS Avg:     -23.3 %  RIGHT VENTRICLE RV Basal diam:  2.52 cm LEFT ATRIUM             Index        RIGHT ATRIUM          Index LA diam:        3.50 cm 1.96 cm/m   RA Area:     9.34 cm LA Vol (A2C):  19.8 ml 11.08 ml/m  RA Volume:   16.50 ml 9.23 ml/m LA Vol (A4C):   19.3 ml 10.80 ml/m LA Biplane Vol: 19.7 ml 11.02 ml/m  AORTIC VALVE                    PULMONIC VALVE AV Area (Vmax):    2.12 cm     PV Vmax:       1.06 m/s AV Area (Vmean):   2.32 cm     PV Vmean:      72.100 cm/s AV Area (VTI):     2.50 cm     PV VTI:        0.176 m AV Vmax:           135.00 cm/s  PV Peak grad:  4.5 mmHg AV Vmean:          89.800 cm/s  PV Mean grad:  2.0 mmHg AV VTI:            0.194 m AV Peak Grad:      7.3 mmHg AV Mean Grad:      4.0 mmHg LVOT Vmax:         101.00 cm/s LVOT Vmean:        73.600 cm/s LVOT VTI:          0.171 m LVOT/AV VTI ratio: 0.88  AORTA Ao Root diam: 3.40 cm MITRAL VALVE MV Area (PHT): 4.54 cm    SHUNTS MV Decel Time: 167 msec    Systemic VTI:  0.17 m MV E velocity: 75.80 cm/s  Systemic Diam: 1.90 cm MV A velocity: 62.60 cm/s MV E/A ratio:  1.21 Ida Rogue MD Electronically signed by Ida Rogue MD  Signature Date/Time: 10/04/2021/6:37:13 PM    Final     Medications   Scheduled Meds:  Chlorhexidine Gluconate Cloth  6 each Topical Q0600   dexamethasone (DECADRON) injection  12 mg Intravenous Daily   enoxaparin (LOVENOX) injection  1 mg/kg Subcutaneous Daily   metoCLOPramide (REGLAN) injection  5 mg Intravenous Q6H   pantoprazole (PROTONIX) IV  40 mg Intravenous Q12H   sodium chloride flush  3 mL Intravenous Q12H   No recently discontinued medications to reconcile  LOS: 2 days   Richarda Osmond, DO [facility] 10/06/2021, 6:05 AM   Available by Epic secure chat 7AM-7PM. If 7PM-7AM, please contact night-coverage Refer to [email or web address] to contact the [facility] Attending or Consulting provider for this pt

## 2021-10-10 LAB — MISC LABCORP TEST (SEND OUT)

## 2021-10-11 ENCOUNTER — Other Ambulatory Visit: Payer: Self-pay | Admitting: Hospice and Palliative Medicine

## 2021-10-11 MED ORDER — HYDROMORPHONE HCL 2 MG PO TABS: 1.0000 mg | ORAL_TABLET | ORAL | 0 refills | Status: AC | PRN

## 2021-10-11 NOTE — Progress Notes (Signed)
Hospice nurse, Melody, requested refill of hydromorphone tablets. Rx sent to pharmacy.

## 2021-10-16 ENCOUNTER — Other Ambulatory Visit: Payer: Self-pay | Admitting: Hospice and Palliative Medicine

## 2021-10-16 MED ORDER — MORPHINE SULFATE (CONCENTRATE) 10 MG /0.5 ML PO SOLN: 5.0000 mg | ORAL | 0 refills | Status: AC | PRN

## 2021-10-16 NOTE — Progress Notes (Signed)
I received a call from hospice nurse, Melody.  They would like to add morphine elixir to have on hand as part of their comfort medications.  We will send Rx to pharmacy

## 2021-10-24 ENCOUNTER — Other Ambulatory Visit: Payer: Self-pay | Admitting: Hospice and Palliative Medicine

## 2021-10-24 MED ORDER — CLONAZEPAM 0.5 MG PO TABS: 0.5000 mg | ORAL_TABLET | Freq: Two times a day (BID) | ORAL | 1 refills | Status: AC | PRN

## 2021-10-24 NOTE — Progress Notes (Signed)
Received call from hospice nurse, Melody.  Patient is having more severe anxiety.  We will liberalize Klonopin to 0.5 to 1 mg twice daily as needed.  Rx sent to pharmacy #[id number]

## 2021-11-14 DEATH — deceased

## 2022-07-10 LAB — LIPASE, FLUID: Lipase-Fluid: 8 U/L

## 2023-01-01 IMAGING — CT CT ABD-PELV W/ CM
2 of 5 series · 15 of 46 positions shown, 17 images · IV contrast (APPLIED)
Comparison: 05/09/2010

CLINICAL DATA: Generalized abdominal pain with bloating. Left-sided
abdominal pain with nausea and constipation 1 month.

EXAM:
CT ABDOMEN AND PELVIS WITH CONTRAST
TECHNIQUE: Multidetector CT imaging of the abdomen and pelvis was performed
using the standard protocol following bolus administration of
intravenous contrast.
CONTRAST:  100mL OMNIPAQUE IOHEXOL 300 MG/ML  SOLN

[Series 2: axial st · axial · 0.98mm/px · z∈[-854,-419]mm · 12 of 103 slices shown, 14 images]
[im 8/103  soft-tissue]
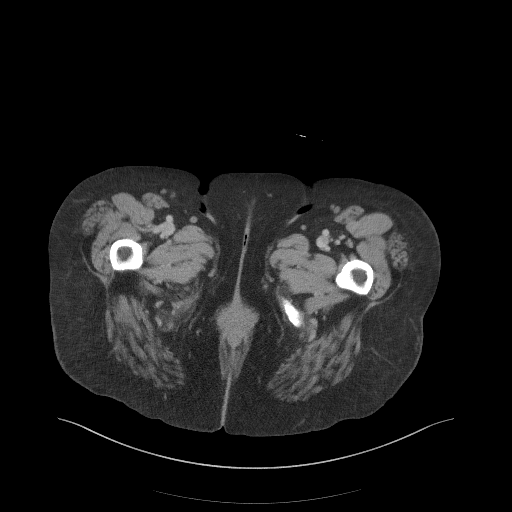
[im 8/103  bone]
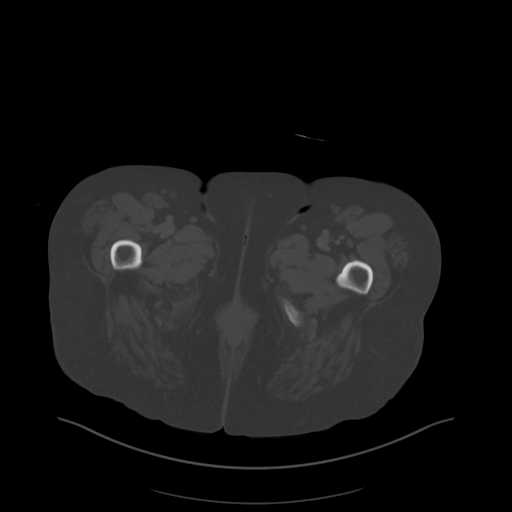
[im 15/103  soft-tissue]
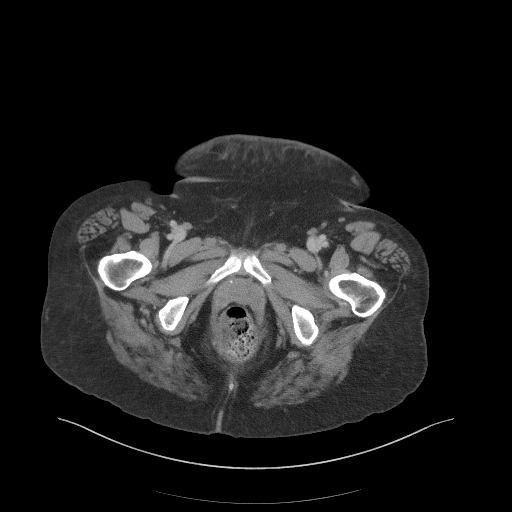
[im 22/103  soft-tissue]
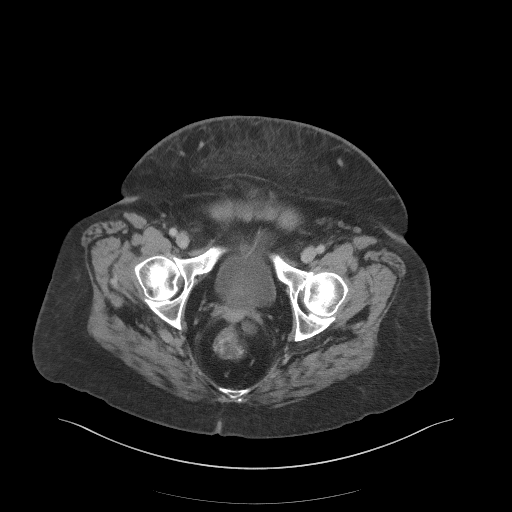
[im 30/103  soft-tissue]
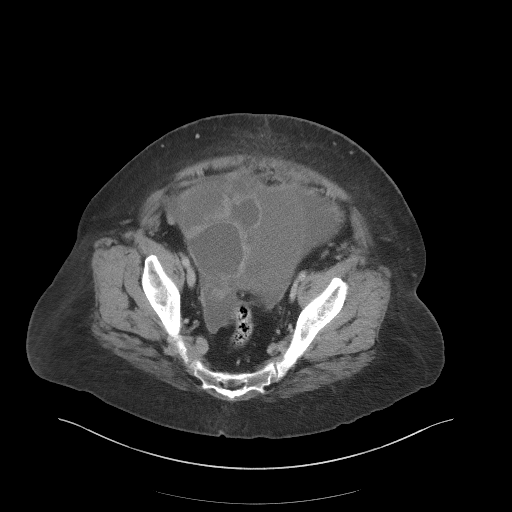
[im 37/103  soft-tissue]
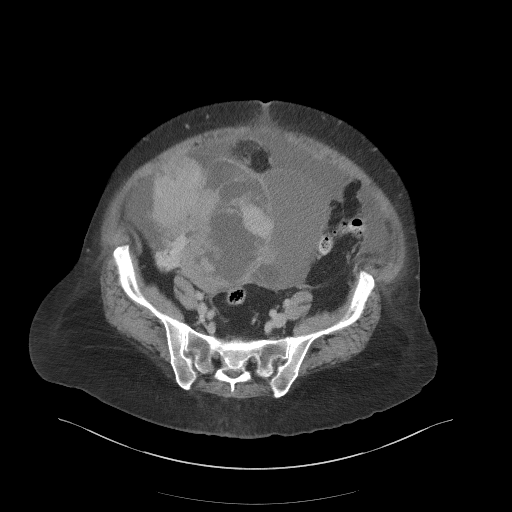
[im 44/103  soft-tissue]
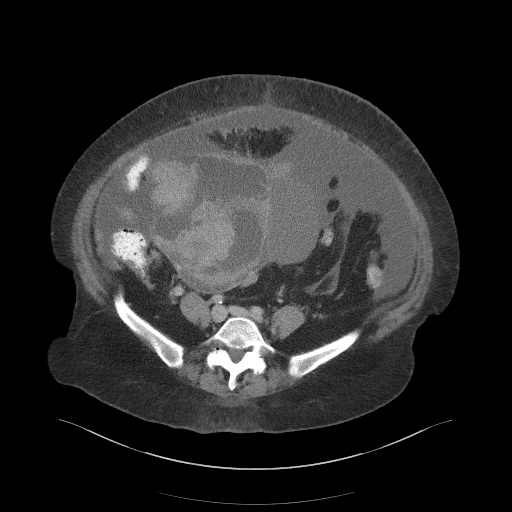
[im 59/103  soft-tissue]
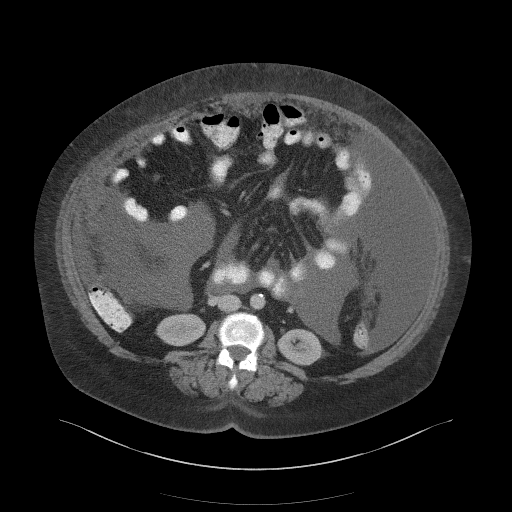
[im 66/103  soft-tissue]
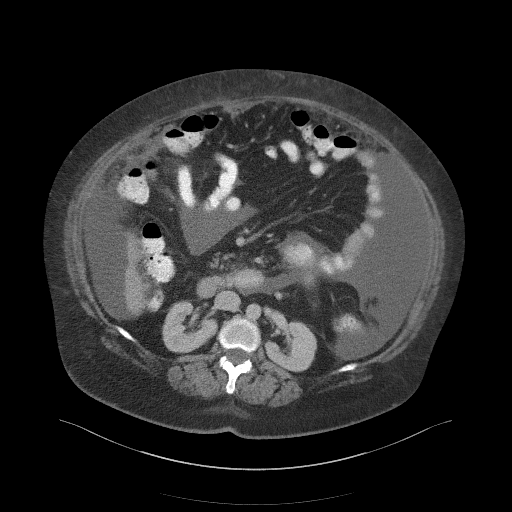
[im 73/103  soft-tissue]
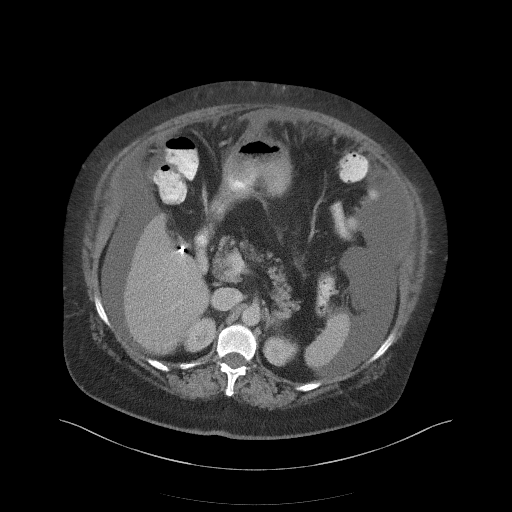
[im 73/103  bone]
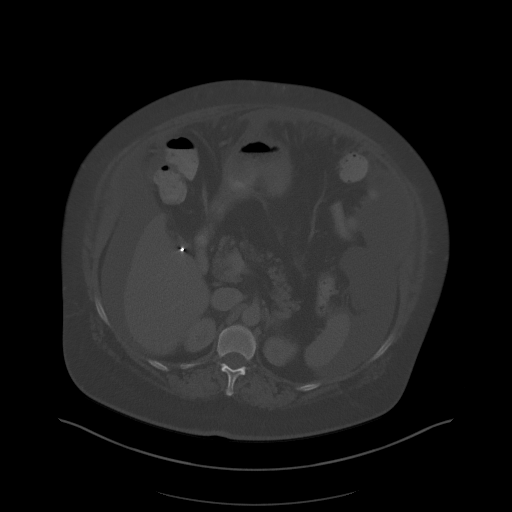
[im 81/103  soft-tissue]
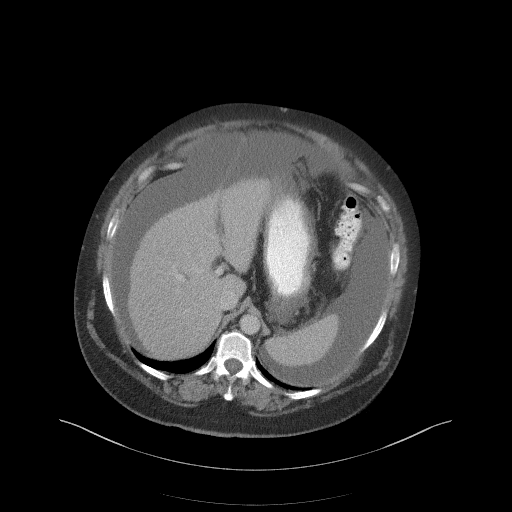
[im 88/103  soft-tissue]
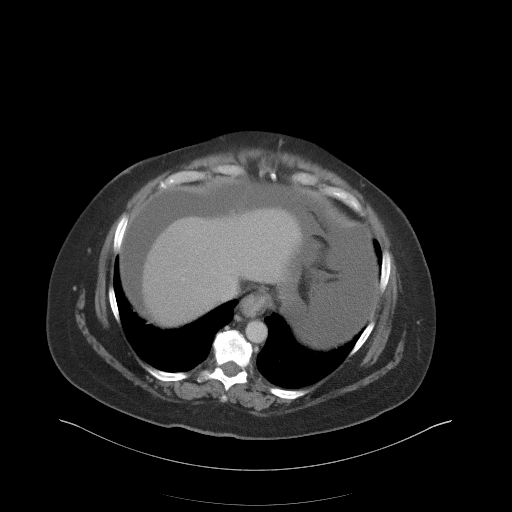
[im 95/103  soft-tissue]
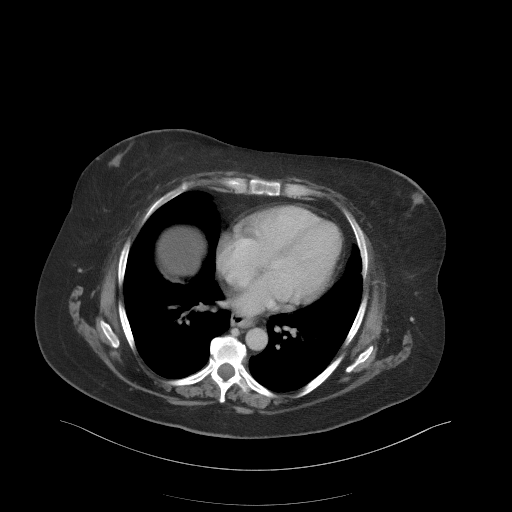

[Series 5: coronal st · coronal · 0.83mm/px · 3 of 132 slices shown]
[im 44/132  soft-tissue]
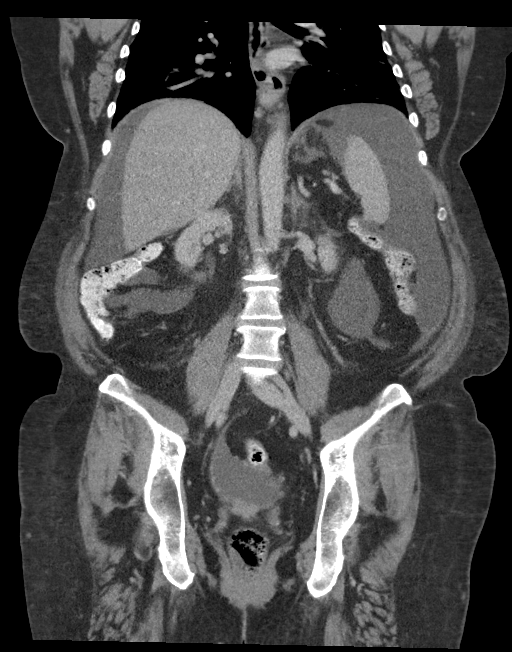
[im 59/132  soft-tissue]
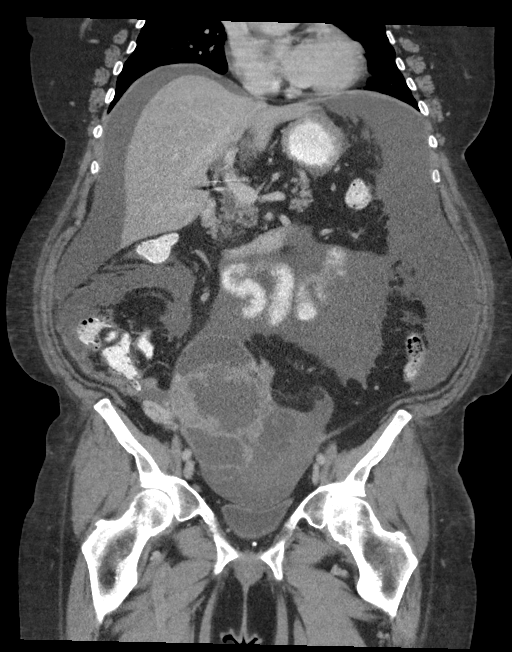
[im 73/132  soft-tissue]
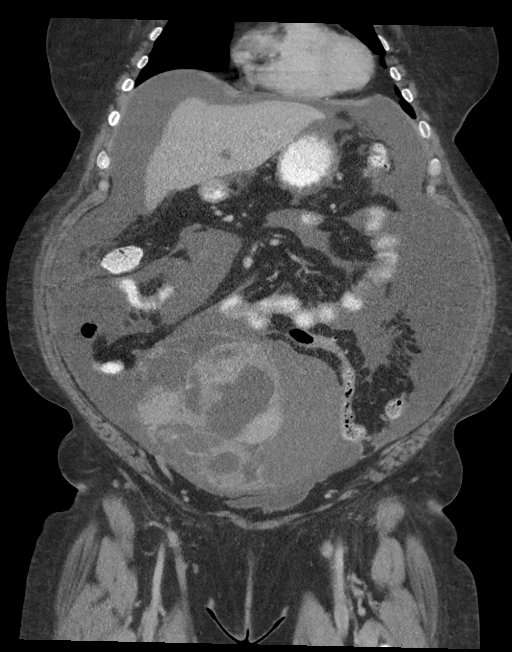

[15 of 46 positions shown; findings below may reference images not displayed]

FINDINGS: Lower chest: Mild linear scarring/atelectasis in the lung bases. No
effusion or consolidation. Couple indeterminate subcentimeter lymph
nodes adjacent the distal esophagus.

Hepatobiliary: Prior cholecystectomy. Slightly small liver with mild
nodular contour. Biliary tree is normal.

Pancreas: Normal.

Spleen: Normal.

Adrenals/Urinary Tract: Adrenal glands are normal. Kidneys are
normal in size without hydronephrosis or nephrolithiasis. Ureters
and bladder are normal.

Stomach/Bowel: Stomach and small bowel are normal. Appendix not well
visualized. Minimal diverticulosis of the sigmoid colon which is
otherwise unremarkable.

Vascular/Lymphatic: Minimal calcified plaque over the abdominal
aorta which is normal caliber. No significant adenopathy.

Reproductive: Previous hysterectomy. Left ovary not visualized.
Large solid and cystic right ovarian mass measuring approximately
14.1 x 18.1 x 16 cm and AP, transverse and craniocaudal dimensions.
Findings highly suspicious for primary right ovarian malignancy.
There is moderate associated ascites with mild nodularity along the
anterior peritoneal surface and extending to the anterior midline of
the anterior pelvic wall. Subtle nodular studding adjacent the
border of the pelvic fluid. These findings are compatible with
peritoneal spread of neoplastic disease.

Other: Minimal subcutaneous edema over the abdominal wall.

Musculoskeletal: No focal abnormality.
IMPRESSION: 1. Large solid and cystic right ovarian mass measuring 14.1 x 18.1 x
16 cm highly suspicious for primary ovarian malignancy. There is
evidence of peritoneal spread of neoplastic disease as described.
Moderate associated ascites.
2. Slightly small liver with mild nodular contour suggesting a
degree of cirrhosis.
3. Minimal sigmoid diverticulosis.
4. Aortic atherosclerosis.

Aortic Atherosclerosis ([id number]).

These results will be called to the ordering clinician or
representative by the Radiologist Assistant, and communication
documented in the PACS or [REDACTED].

## 2023-01-18 IMAGING — US US PARACENTESIS
1 series · 6 of 6 positions shown · non-contrast
Comparison: none

INDICATION: Patient with recently noted right-sided ovarian mass and new onset
ascites. Request to IR for diagnostic and therapeutic paracentesis.

[Series 1: us paracentesis · 0.26mm/px · 6 of 6 slices shown]
[im 1/6]
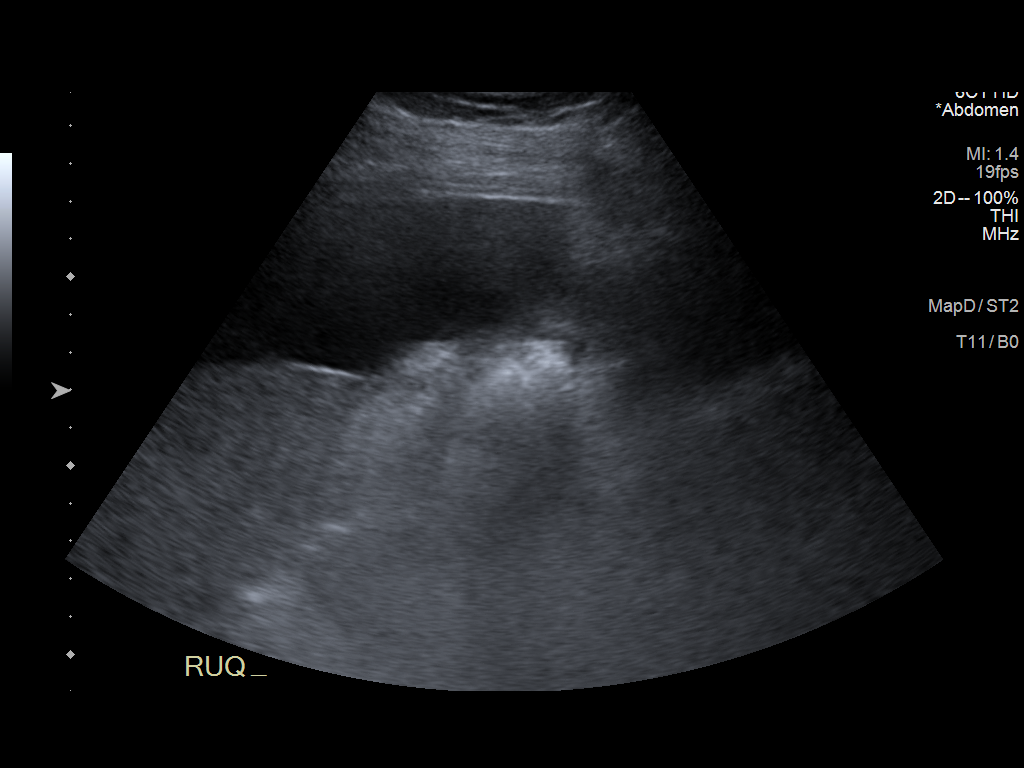
[im 2/6]
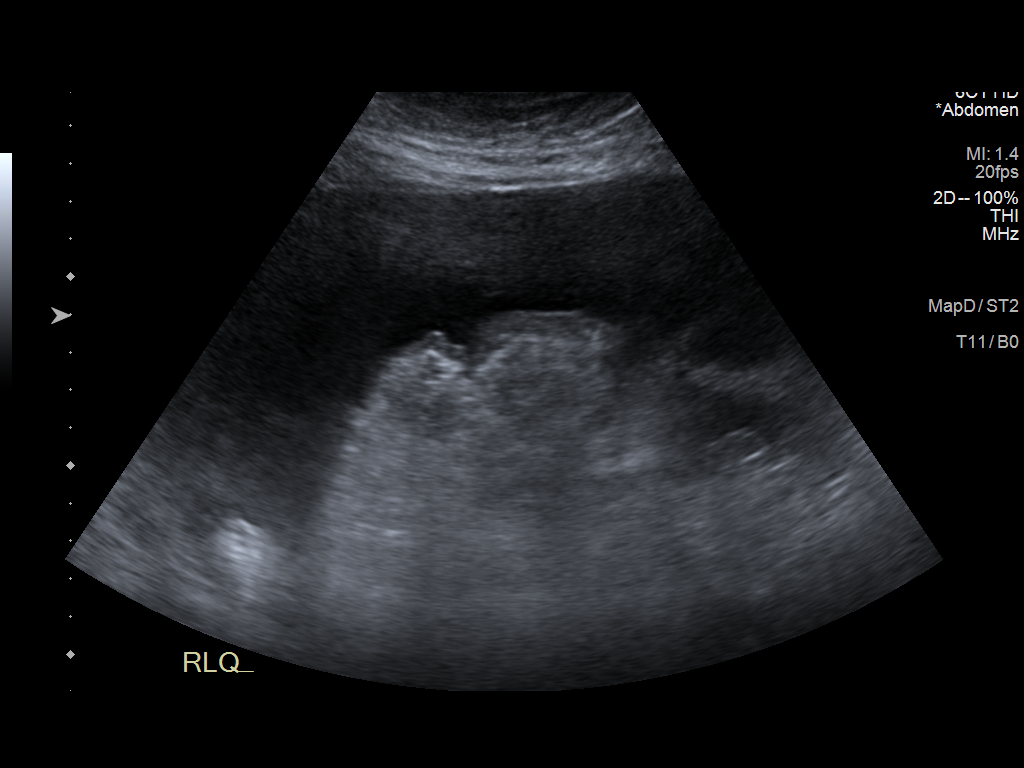
[im 3/6]
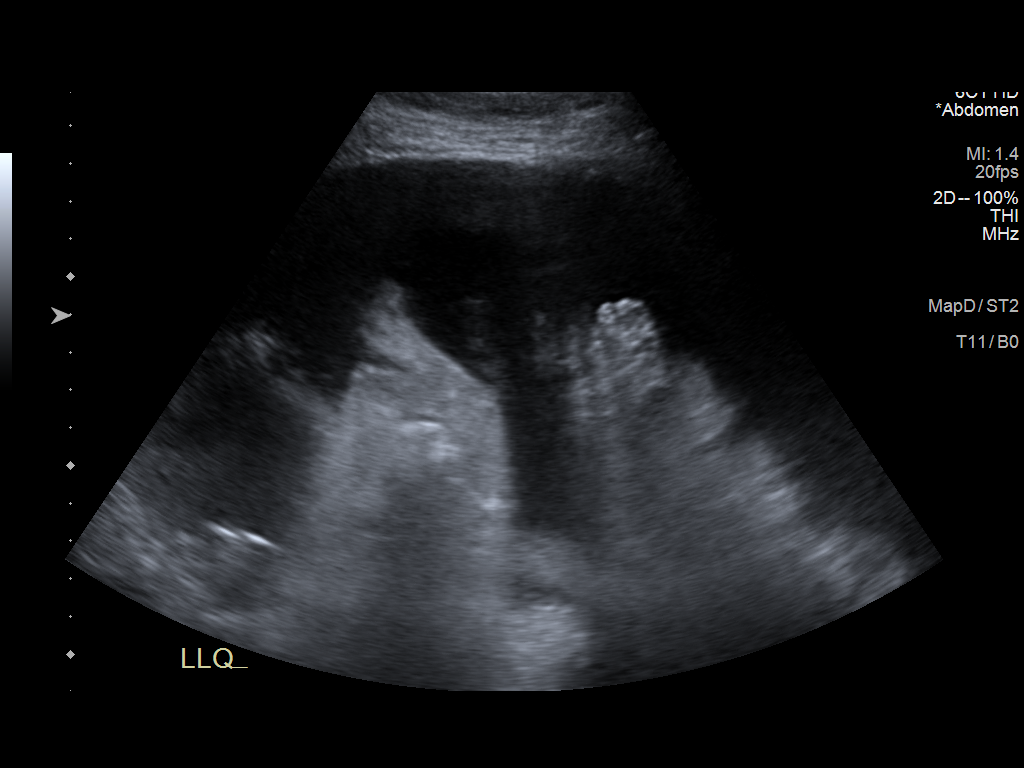
[im 4/6]
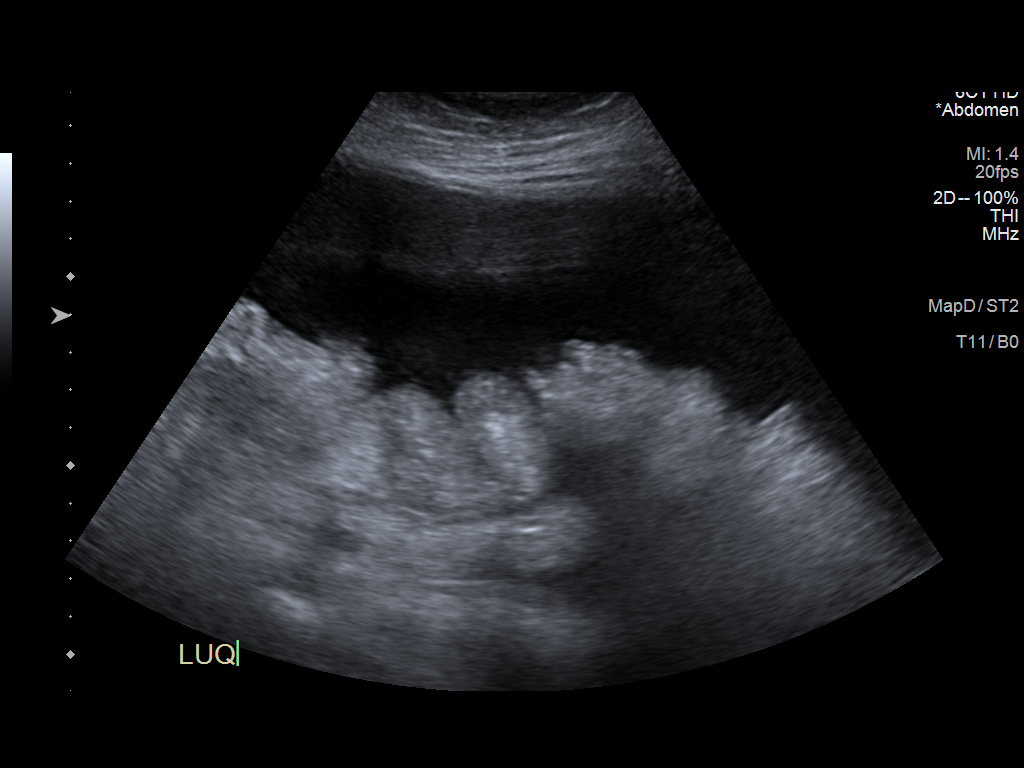
[im 5/6]
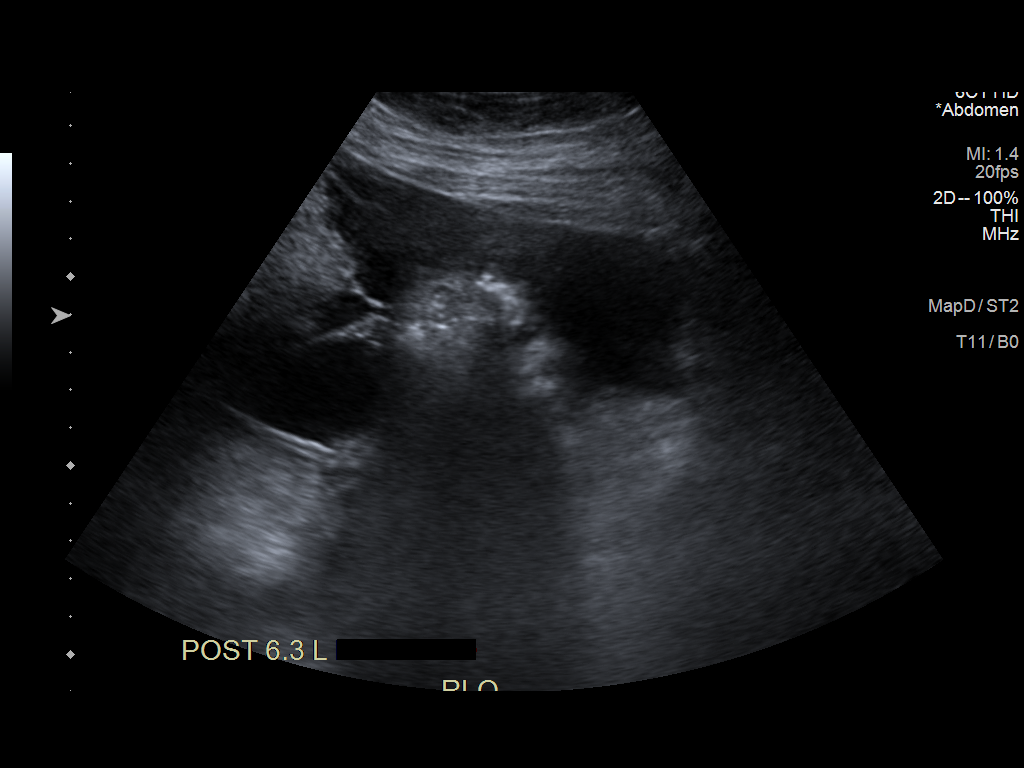
[im 6/6]
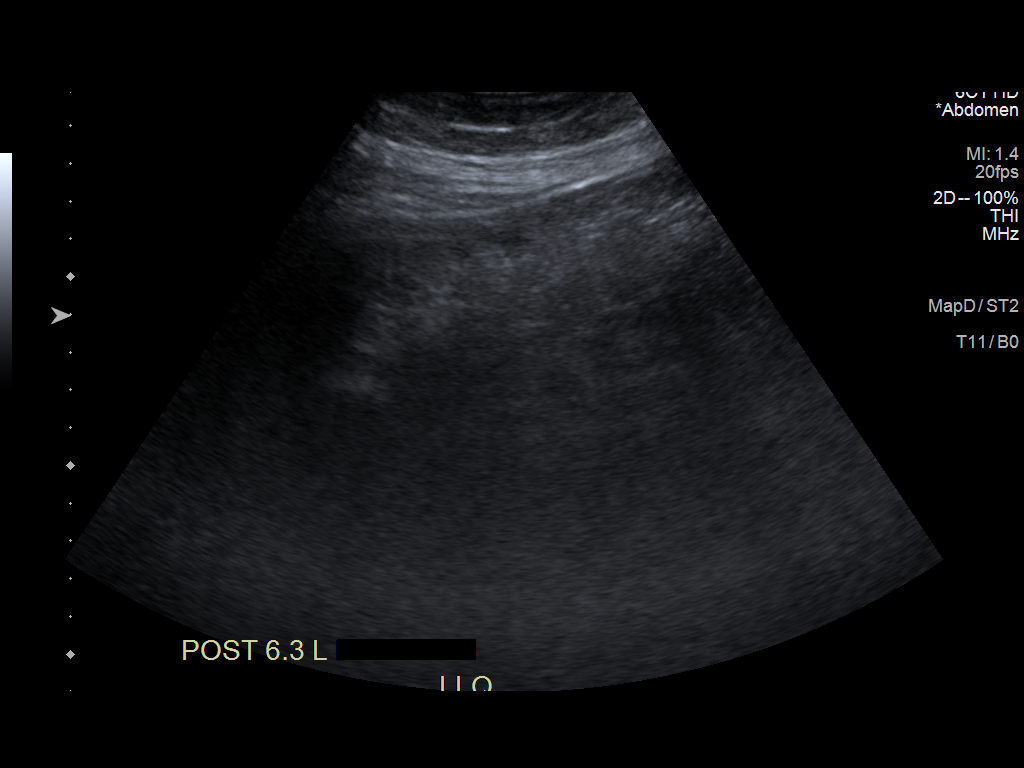

[6 of 6 positions shown; findings below may reference images not displayed]

EXAM:
ULTRASOUND GUIDED DIAGNOSTIC THERAPEUTIC PARACENTESIS

MEDICATIONS:
8 mL% lidocaine

COMPLICATIONS:
None immediate.

PROCEDURE:
Informed written consent was obtained from the patient after a
discussion of the risks, benefits and alternatives to treatment. A
timeout was performed prior to the initiation of the procedure.

Initial ultrasound scanning demonstrates a large amount of ascites
within the right lower abdominal quadrant. The right lower abdomen
was prepped and draped in the usual sterile fashion. 1% lidocaine
was used for local anesthesia.

Following this, a 19 gauge, 10-cm, Yueh catheter was introduced. An
ultrasound image was saved for documentation purposes. The
paracentesis was performed. The catheter was removed and a dressing
was applied. The patient tolerated the procedure well without
immediate post procedural complication.
FINDINGS: A total of approximately 6.3 L of hazy yellow fluid was removed.
Samples were sent to the laboratory as requested by the clinical
team.
IMPRESSION: Successful ultrasound-guided paracentesis yielding 6.3 liters of
peritoneal fluid.

Read by Raymond, Volcy
